# Patient Record
Sex: Male | Born: 1950 | Race: White | Hispanic: No | Marital: Married | Smoking: Former smoker
Health system: Southern US, Community
[De-identification: ages and names within clinical notes are randomized; demographics above are authoritative.]

## PROBLEM LIST (undated history)

## (undated) DIAGNOSIS — E785 Hyperlipidemia, unspecified: Secondary | ICD-10-CM

## (undated) DIAGNOSIS — T7840XA Allergy, unspecified, initial encounter: Secondary | ICD-10-CM

## (undated) DIAGNOSIS — E119 Type 2 diabetes mellitus without complications: Secondary | ICD-10-CM

## (undated) DIAGNOSIS — I200 Unstable angina: Secondary | ICD-10-CM

## (undated) DIAGNOSIS — I2510 Atherosclerotic heart disease of native coronary artery without angina pectoris: Secondary | ICD-10-CM

## (undated) DIAGNOSIS — W57XXXA Bitten or stung by nonvenomous insect and other nonvenomous arthropods, initial encounter: Secondary | ICD-10-CM

## (undated) DIAGNOSIS — I10 Essential (primary) hypertension: Secondary | ICD-10-CM

## (undated) DIAGNOSIS — Z8739 Personal history of other diseases of the musculoskeletal system and connective tissue: Secondary | ICD-10-CM

## (undated) DIAGNOSIS — I639 Cerebral infarction, unspecified: Secondary | ICD-10-CM

## (undated) HISTORY — PX: BACK SURGERY: SHX140

## (undated) HISTORY — PX: POLYPECTOMY: SHX149

## (undated) HISTORY — PX: COLONOSCOPY: SHX174

## (undated) HISTORY — PX: COLONOSCOPY W/ POLYPECTOMY: SHX1380

## (undated) HISTORY — DX: Bitten or stung by nonvenomous insect and other nonvenomous arthropods, initial encounter: W57.XXXA

## (undated) HISTORY — DX: Allergy, unspecified, initial encounter: T78.40XA

## (undated) HISTORY — PX: FACIAL RECONSTRUCTION SURGERY: SHX631

## (undated) HISTORY — DX: Hyperlipidemia, unspecified: E78.5

## (undated) HISTORY — DX: Cerebral infarction, unspecified: I63.9

## (undated) HISTORY — PX: EXCISIONAL HEMORRHOIDECTOMY: SHX1541

---
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
## 1999-12-18 DIAGNOSIS — W57XXXA Bitten or stung by nonvenomous insect and other nonvenomous arthropods, initial encounter: Secondary | ICD-10-CM

## 1999-12-18 HISTORY — DX: Bitten or stung by nonvenomous insect and other nonvenomous arthropods, initial encounter: W57.XXXA

## 2001-08-14 ENCOUNTER — Inpatient Hospital Stay (HOSPITAL_COMMUNITY): Admission: EM | Admit: 2001-08-14 | Discharge: 2001-08-15 | Payer: Self-pay

## 2001-08-14 ENCOUNTER — Encounter: Payer: Self-pay | Admitting: *Deleted

## 2001-08-15 ENCOUNTER — Encounter: Payer: Self-pay | Admitting: Cardiology

## 2002-07-05 ENCOUNTER — Encounter: Payer: Self-pay | Admitting: Endocrinology

## 2002-07-05 ENCOUNTER — Inpatient Hospital Stay (HOSPITAL_COMMUNITY): Admission: EM | Admit: 2002-07-05 | Discharge: 2002-07-08 | Payer: Self-pay | Admitting: Emergency Medicine

## 2002-07-05 ENCOUNTER — Encounter: Payer: Self-pay | Admitting: Emergency Medicine

## 2002-07-06 ENCOUNTER — Encounter: Payer: Self-pay | Admitting: Internal Medicine

## 2002-07-06 ENCOUNTER — Encounter: Payer: Self-pay | Admitting: Endocrinology

## 2003-01-20 ENCOUNTER — Encounter: Payer: Self-pay | Admitting: Orthopaedic Surgery

## 2003-01-20 ENCOUNTER — Encounter: Admission: RE | Admit: 2003-01-20 | Discharge: 2003-01-20 | Payer: Self-pay | Admitting: Orthopaedic Surgery

## 2003-02-13 ENCOUNTER — Encounter: Payer: Self-pay | Admitting: Emergency Medicine

## 2003-02-13 ENCOUNTER — Emergency Department (HOSPITAL_COMMUNITY): Admission: EM | Admit: 2003-02-13 | Discharge: 2003-02-13 | Payer: Self-pay | Admitting: Emergency Medicine

## 2003-02-17 ENCOUNTER — Ambulatory Visit (HOSPITAL_COMMUNITY): Admission: RE | Admit: 2003-02-17 | Discharge: 2003-02-17 | Payer: Self-pay | Admitting: Oral & Maxillofacial Surgery

## 2003-02-21 ENCOUNTER — Emergency Department (HOSPITAL_COMMUNITY): Admission: EM | Admit: 2003-02-21 | Discharge: 2003-02-21 | Payer: Self-pay | Admitting: Emergency Medicine

## 2004-11-27 ENCOUNTER — Ambulatory Visit: Payer: Self-pay | Admitting: Internal Medicine

## 2005-10-15 ENCOUNTER — Ambulatory Visit: Payer: Self-pay | Admitting: Internal Medicine

## 2006-08-09 ENCOUNTER — Ambulatory Visit: Payer: Self-pay | Admitting: Internal Medicine

## 2006-08-21 ENCOUNTER — Ambulatory Visit: Payer: Self-pay | Admitting: Internal Medicine

## 2006-08-28 ENCOUNTER — Ambulatory Visit: Payer: Self-pay | Admitting: Internal Medicine

## 2007-09-08 ENCOUNTER — Ambulatory Visit: Payer: Self-pay | Admitting: Internal Medicine

## 2007-09-08 DIAGNOSIS — N400 Enlarged prostate without lower urinary tract symptoms: Secondary | ICD-10-CM | POA: Insufficient documentation

## 2007-09-08 DIAGNOSIS — T887XXA Unspecified adverse effect of drug or medicament, initial encounter: Secondary | ICD-10-CM | POA: Insufficient documentation

## 2007-09-08 DIAGNOSIS — G479 Sleep disorder, unspecified: Secondary | ICD-10-CM | POA: Insufficient documentation

## 2007-09-08 LAB — CONVERTED CEMR LAB
Cholesterol, target level: 200 mg/dL
HDL goal, serum: 40 mg/dL
LDL Goal: 160 mg/dL

## 2007-09-12 ENCOUNTER — Encounter (INDEPENDENT_AMBULATORY_CARE_PROVIDER_SITE_OTHER): Payer: Self-pay | Admitting: *Deleted

## 2007-09-12 ENCOUNTER — Ambulatory Visit: Payer: Self-pay | Admitting: Internal Medicine

## 2007-09-12 LAB — CONVERTED CEMR LAB
ALT: 35 units/L (ref 0–53)
AST: 24 units/L (ref 0–37)
Albumin: 3.9 g/dL (ref 3.5–5.2)
Alkaline Phosphatase: 72 units/L (ref 39–117)
BUN: 11 mg/dL (ref 6–23)
Bilirubin, Direct: 0.1 mg/dL (ref 0.0–0.3)
CO2: 24 meq/L (ref 19–32)
Calcium: 9.2 mg/dL (ref 8.4–10.5)
Chloride: 104 meq/L (ref 96–112)
Cholesterol: 212 mg/dL (ref 0–200)
Creatinine, Ser: 0.9 mg/dL (ref 0.4–1.5)
Direct LDL: 153.3 mg/dL
GFR calc Af Amer: 112 mL/min
GFR calc non Af Amer: 92 mL/min
Glucose, Bld: 120 mg/dL — ABNORMAL HIGH (ref 70–99)
HDL: 37 mg/dL — ABNORMAL LOW (ref >39.0)
Hgb A1c MFr Bld: 6.3 % — ABNORMAL HIGH (ref 4.6–6.0)
PSA: 0.85 ng/mL (ref 0.10–4.00)
Potassium: 3.8 meq/L (ref 3.5–5.1)
Sodium: 138 meq/L (ref 135–145)
Total Bilirubin: 1 mg/dL (ref 0.3–1.2)
Total CHOL/HDL Ratio: 5.7
Total Protein: 6.8 g/dL (ref 6.0–8.3)
Triglycerides: 152 mg/dL — ABNORMAL HIGH (ref 0–149)
VLDL: 30 mg/dL (ref 0–40)

## 2007-09-15 ENCOUNTER — Encounter (INDEPENDENT_AMBULATORY_CARE_PROVIDER_SITE_OTHER): Payer: Self-pay | Admitting: *Deleted

## 2007-09-24 ENCOUNTER — Encounter: Payer: Self-pay | Admitting: Internal Medicine

## 2007-11-05 ENCOUNTER — Ambulatory Visit: Payer: Self-pay | Admitting: Internal Medicine

## 2007-11-05 DIAGNOSIS — R7989 Other specified abnormal findings of blood chemistry: Secondary | ICD-10-CM | POA: Insufficient documentation

## 2007-11-06 ENCOUNTER — Encounter (INDEPENDENT_AMBULATORY_CARE_PROVIDER_SITE_OTHER): Payer: Self-pay | Admitting: *Deleted

## 2007-11-06 LAB — CONVERTED CEMR LAB: Hgb A1c MFr Bld: 6.3 % — ABNORMAL HIGH (ref 4.6–6.0)

## 2007-11-12 ENCOUNTER — Ambulatory Visit: Payer: Self-pay | Admitting: Internal Medicine

## 2007-11-12 LAB — CONVERTED CEMR LAB: LDL Goal: 130 mg/dL

## 2007-11-14 DIAGNOSIS — M109 Gout, unspecified: Secondary | ICD-10-CM | POA: Insufficient documentation

## 2007-11-14 DIAGNOSIS — Z8739 Personal history of other diseases of the musculoskeletal system and connective tissue: Secondary | ICD-10-CM | POA: Insufficient documentation

## 2008-02-12 ENCOUNTER — Ambulatory Visit: Payer: Self-pay | Admitting: Internal Medicine

## 2008-02-15 LAB — CONVERTED CEMR LAB
ALT: 43 units/L (ref 0–53)
AST: 27 units/L (ref 0–37)
Albumin: 3.7 g/dL (ref 3.5–5.2)
Alkaline Phosphatase: 63 units/L (ref 39–117)
BUN: 17 mg/dL (ref 6–23)
Bilirubin, Direct: 0.1 mg/dL (ref 0.0–0.3)
Cholesterol: 141 mg/dL (ref 0–200)
Creatinine, Ser: 1.2 mg/dL (ref 0.4–1.5)
HDL: 32.8 mg/dL — ABNORMAL LOW (ref >39.0)
Hgb A1c MFr Bld: 5.9 % (ref 4.6–6.0)
LDL Cholesterol: 78 mg/dL (ref 0–99)
Potassium: 4.2 meq/L (ref 3.5–5.1)
Total Bilirubin: 0.8 mg/dL (ref 0.3–1.2)
Total CHOL/HDL Ratio: 4.3
Total Protein: 6.4 g/dL (ref 6.0–8.3)
Triglycerides: 149 mg/dL (ref 0–149)
VLDL: 30 mg/dL (ref 0–40)

## 2008-02-17 ENCOUNTER — Encounter (INDEPENDENT_AMBULATORY_CARE_PROVIDER_SITE_OTHER): Payer: Self-pay | Admitting: *Deleted

## 2008-02-24 ENCOUNTER — Ambulatory Visit: Payer: Self-pay | Admitting: Internal Medicine

## 2008-02-24 DIAGNOSIS — E1169 Type 2 diabetes mellitus with other specified complication: Secondary | ICD-10-CM | POA: Insufficient documentation

## 2008-02-24 DIAGNOSIS — E782 Mixed hyperlipidemia: Secondary | ICD-10-CM | POA: Insufficient documentation

## 2008-08-12 ENCOUNTER — Telehealth (INDEPENDENT_AMBULATORY_CARE_PROVIDER_SITE_OTHER): Payer: Self-pay | Admitting: *Deleted

## 2008-08-20 ENCOUNTER — Ambulatory Visit: Payer: Self-pay | Admitting: Internal Medicine

## 2008-08-20 LAB — CONVERTED CEMR LAB
Creatinine,U: 262.7 mg/dL
Hgb A1c MFr Bld: 5.9 % (ref 4.6–6.0)
Microalb Creat Ratio: 13.3 mg/g (ref 0.0–30.0)
Microalb, Ur: 3.5 mg/dL — ABNORMAL HIGH (ref 0.0–1.9)
Total CHOL/HDL Ratio: 3.8

## 2008-08-26 ENCOUNTER — Telehealth: Payer: Self-pay | Admitting: Internal Medicine

## 2008-08-26 ENCOUNTER — Ambulatory Visit: Payer: Self-pay | Admitting: Internal Medicine

## 2008-08-26 DIAGNOSIS — D126 Benign neoplasm of colon, unspecified: Secondary | ICD-10-CM | POA: Insufficient documentation

## 2008-11-10 ENCOUNTER — Encounter: Payer: Self-pay | Admitting: Internal Medicine

## 2008-12-15 ENCOUNTER — Encounter: Payer: Self-pay | Admitting: Internal Medicine

## 2008-12-23 ENCOUNTER — Telehealth (INDEPENDENT_AMBULATORY_CARE_PROVIDER_SITE_OTHER): Payer: Self-pay | Admitting: *Deleted

## 2008-12-30 ENCOUNTER — Encounter: Payer: Self-pay | Admitting: Internal Medicine

## 2009-01-06 ENCOUNTER — Ambulatory Visit: Payer: Self-pay | Admitting: Internal Medicine

## 2009-01-06 DIAGNOSIS — Z860100 Personal history of colon polyps, unspecified: Secondary | ICD-10-CM | POA: Insufficient documentation

## 2009-01-06 DIAGNOSIS — L509 Urticaria, unspecified: Secondary | ICD-10-CM | POA: Insufficient documentation

## 2009-01-06 DIAGNOSIS — E119 Type 2 diabetes mellitus without complications: Secondary | ICD-10-CM | POA: Insufficient documentation

## 2009-01-19 ENCOUNTER — Encounter: Payer: Self-pay | Admitting: Internal Medicine

## 2009-03-07 ENCOUNTER — Encounter (INDEPENDENT_AMBULATORY_CARE_PROVIDER_SITE_OTHER): Payer: Self-pay | Admitting: *Deleted

## 2009-03-14 ENCOUNTER — Telehealth (INDEPENDENT_AMBULATORY_CARE_PROVIDER_SITE_OTHER): Payer: Self-pay | Admitting: *Deleted

## 2009-03-15 ENCOUNTER — Ambulatory Visit: Payer: Self-pay | Admitting: Internal Medicine

## 2009-03-15 ENCOUNTER — Encounter (INDEPENDENT_AMBULATORY_CARE_PROVIDER_SITE_OTHER): Payer: Self-pay | Admitting: *Deleted

## 2009-03-15 LAB — CONVERTED CEMR LAB
Albumin: 3.7 g/dL (ref 3.5–5.2)
BUN: 15 mg/dL (ref 6–23)
HDL: 36.9 mg/dL — ABNORMAL LOW (ref >39.00)
LDL Cholesterol: 77 mg/dL (ref 0–99)
Potassium: 4.4 meq/L (ref 3.5–5.1)
Total CHOL/HDL Ratio: 4
Triglycerides: 177 mg/dL — ABNORMAL HIGH (ref 0.0–149.0)

## 2009-03-21 ENCOUNTER — Ambulatory Visit: Payer: Self-pay | Admitting: Internal Medicine

## 2009-04-19 ENCOUNTER — Telehealth (INDEPENDENT_AMBULATORY_CARE_PROVIDER_SITE_OTHER): Payer: Self-pay | Admitting: *Deleted

## 2009-06-16 ENCOUNTER — Ambulatory Visit: Payer: Self-pay | Admitting: Internal Medicine

## 2009-06-29 ENCOUNTER — Encounter: Payer: Self-pay | Admitting: Internal Medicine

## 2009-06-29 ENCOUNTER — Ambulatory Visit: Payer: Self-pay | Admitting: Internal Medicine

## 2009-06-30 ENCOUNTER — Encounter: Payer: Self-pay | Admitting: Internal Medicine

## 2009-07-06 ENCOUNTER — Telehealth: Payer: Self-pay | Admitting: Internal Medicine

## 2009-07-14 ENCOUNTER — Telehealth (INDEPENDENT_AMBULATORY_CARE_PROVIDER_SITE_OTHER): Payer: Self-pay | Admitting: *Deleted

## 2009-08-03 ENCOUNTER — Telehealth (INDEPENDENT_AMBULATORY_CARE_PROVIDER_SITE_OTHER): Payer: Self-pay | Admitting: *Deleted

## 2009-08-08 ENCOUNTER — Telehealth (INDEPENDENT_AMBULATORY_CARE_PROVIDER_SITE_OTHER): Payer: Self-pay | Admitting: *Deleted

## 2009-08-09 ENCOUNTER — Telehealth (INDEPENDENT_AMBULATORY_CARE_PROVIDER_SITE_OTHER): Payer: Self-pay | Admitting: *Deleted

## 2009-09-07 ENCOUNTER — Telehealth (INDEPENDENT_AMBULATORY_CARE_PROVIDER_SITE_OTHER): Payer: Self-pay | Admitting: *Deleted

## 2009-12-05 ENCOUNTER — Encounter: Payer: Self-pay | Admitting: Internal Medicine

## 2009-12-28 ENCOUNTER — Telehealth (INDEPENDENT_AMBULATORY_CARE_PROVIDER_SITE_OTHER): Payer: Self-pay | Admitting: *Deleted

## 2010-01-31 ENCOUNTER — Telehealth (INDEPENDENT_AMBULATORY_CARE_PROVIDER_SITE_OTHER): Payer: Self-pay | Admitting: *Deleted

## 2010-02-21 ENCOUNTER — Ambulatory Visit: Payer: Self-pay | Admitting: Internal Medicine

## 2010-02-21 DIAGNOSIS — J309 Allergic rhinitis, unspecified: Secondary | ICD-10-CM | POA: Insufficient documentation

## 2010-02-21 DIAGNOSIS — R21 Rash and other nonspecific skin eruption: Secondary | ICD-10-CM | POA: Insufficient documentation

## 2010-02-21 DIAGNOSIS — R930 Abnormal findings on diagnostic imaging of skull and head, not elsewhere classified: Secondary | ICD-10-CM | POA: Insufficient documentation

## 2010-02-24 LAB — CONVERTED CEMR LAB
Creatinine, Ser: 1.2 mg/dL (ref 0.4–1.5)
Microalb Creat Ratio: 6.5 mg/g (ref 0.0–30.0)
Microalb, Ur: 0.7 mg/dL (ref 0.0–1.9)

## 2010-04-26 ENCOUNTER — Encounter (INDEPENDENT_AMBULATORY_CARE_PROVIDER_SITE_OTHER): Payer: Self-pay | Admitting: *Deleted

## 2010-05-03 ENCOUNTER — Telehealth (INDEPENDENT_AMBULATORY_CARE_PROVIDER_SITE_OTHER): Payer: Self-pay | Admitting: *Deleted

## 2010-05-03 ENCOUNTER — Ambulatory Visit: Payer: Self-pay | Admitting: Internal Medicine

## 2010-05-08 LAB — CONVERTED CEMR LAB
Cholesterol: 180 mg/dL (ref 0–200)
Creatinine, Ser: 0.9 mg/dL (ref 0.4–1.5)
HDL: 39.6 mg/dL (ref >39.00)
Hgb A1c MFr Bld: 5.9 % (ref 4.6–6.5)
VLDL: 21.8 mg/dL (ref 0.0–40.0)

## 2010-06-28 ENCOUNTER — Ambulatory Visit: Payer: Self-pay | Admitting: Internal Medicine

## 2010-07-06 ENCOUNTER — Ambulatory Visit: Payer: Self-pay | Admitting: Sports Medicine

## 2010-07-06 DIAGNOSIS — IMO0002 Reserved for concepts with insufficient information to code with codable children: Secondary | ICD-10-CM | POA: Insufficient documentation

## 2010-07-06 DIAGNOSIS — M25569 Pain in unspecified knee: Secondary | ICD-10-CM | POA: Insufficient documentation

## 2010-07-07 ENCOUNTER — Telehealth: Payer: Self-pay | Admitting: Sports Medicine

## 2010-10-16 ENCOUNTER — Encounter: Admission: RE | Admit: 2010-10-16 | Discharge: 2010-10-16 | Payer: Self-pay | Admitting: Sports Medicine

## 2010-10-16 ENCOUNTER — Ambulatory Visit: Payer: Self-pay | Admitting: Sports Medicine

## 2011-01-01 ENCOUNTER — Encounter
Admission: RE | Admit: 2011-01-01 | Discharge: 2011-01-01 | Payer: Self-pay | Source: Home / Self Care | Attending: Sports Medicine | Admitting: Sports Medicine

## 2011-01-01 ENCOUNTER — Ambulatory Visit
Admission: RE | Admit: 2011-01-01 | Discharge: 2011-01-01 | Payer: Self-pay | Source: Home / Self Care | Attending: Sports Medicine | Admitting: Sports Medicine

## 2011-01-01 DIAGNOSIS — M546 Pain in thoracic spine: Secondary | ICD-10-CM | POA: Insufficient documentation

## 2011-01-11 ENCOUNTER — Telehealth (INDEPENDENT_AMBULATORY_CARE_PROVIDER_SITE_OTHER): Payer: Self-pay | Admitting: *Deleted

## 2011-01-15 ENCOUNTER — Ambulatory Visit
Admission: RE | Admit: 2011-01-15 | Discharge: 2011-01-15 | Payer: Self-pay | Source: Home / Self Care | Attending: Internal Medicine | Admitting: Internal Medicine

## 2011-01-15 ENCOUNTER — Other Ambulatory Visit: Payer: Self-pay | Admitting: Internal Medicine

## 2011-01-15 LAB — MICROALBUMIN / CREATININE URINE RATIO
Creatinine,U: 313.6 mg/dL
Microalb Creat Ratio: 1.8 mg/g (ref 0.0–30.0)

## 2011-01-15 LAB — HEPATIC FUNCTION PANEL
AST: 18 U/L (ref 0–37)
Alkaline Phosphatase: 67 U/L (ref 39–117)
Bilirubin, Direct: 0.1 mg/dL (ref 0.0–0.3)
Total Bilirubin: 0.8 mg/dL (ref 0.3–1.2)
Total Protein: 6.4 g/dL (ref 6.0–8.3)

## 2011-01-15 LAB — LIPID PANEL
HDL: 37.2 mg/dL — ABNORMAL LOW (ref >39.00)
Total CHOL/HDL Ratio: 4
Triglycerides: 147 mg/dL (ref 0.0–149.0)
VLDL: 29.4 mg/dL (ref 0.0–40.0)

## 2011-01-16 NOTE — Progress Notes (Signed)
Summary: Refill Request  Phone Note Refill Request   Refills Requested: Medication #1:  PRAVACHOL 40 MG  TABS 1 at bedtime**LABS DUE NOW**  Medication #2:  METFORMIN HCL 500 MG XR24H-TAB 2 two times a day with 2 largest meals. ADDITIONAL REFILLS REQUIRE AN APPOINTMENT patient is out of med he went to pharmacy today - lab scheduled at [facility] [id number] [facility]   Method Requested: Fax to Local Pharmacy Initial call taken by: Okey Regal [place],  May 03, 2010 9:14 AM    Prescriptions: METFORMIN HCL 500 MG XR24H-TAB (METFORMIN HCL) 2 two times a day with 2 largest meals. ADDITIONAL REFILLS REQUIRE AN APPOINTMENT  #60 x 0   Entered by:   Shonna Chock   Authorized by:   Marga Melnick MD   Signed by:   Shonna Chock on 05/03/2010   Method used:   Electronically to        [facility] (retail)       [address]       [place], [place]  [address]       Ph: [phone] or [phone]       Fax: [phone]   RxID:   [id number] PRAVACHOL 40 MG  TABS (PRAVASTATIN SODIUM) 1 at bedtime**LABS DUE NOW**  #30 x 0   Entered by:   Shonna Chock   Authorized by:   Marga Melnick MD   Signed by:   Shonna Chock on 05/03/2010   Method used:   Electronically to        [facility] (retail)       [address]       [place], [place]  [address]       Ph: [phone] or [phone]       Fax: [phone]   RxID:   [id number]

## 2011-01-16 NOTE — Assessment & Plan Note (Signed)
Summary: F/U,MC   Vital Signs:  Patient profile:   60 year old male BP sitting:   177 / 95  Vitals Entered By: Lillia Pauls CMA (October 16, 2010 10:01 AM)  History of Present Illness: Alex Bryant is here today to F/ U on his right knee pain. He was seen previously on July 21/2011. a MSK U/S of his knee was done which reported medial meniscal tear, patellar pouch effusion. He also had an U/S guided cortisone injection on his suprapatellar pouch. He was doing better but in the last 30 days his pain got worse. No Hx of trauma. he does not recall any particular event that trigger the pain back again. He debnies machanical symptoms as locking, catching or given away. He denies  swelling. It hurts with weight bearing activities. The pain is localize in the mid joint line. 1/10 intensity. Sharp. Not radiated.  Allergies: No Known Drug Allergies  Physical Exam  General:  Well-developed,well-nourished,in no acute distress; alert,appropriate and cooperative throughout examination   Knee Exam  General:    Legs with good alingment. Right knee with intact skin. Full ROM for flexion, extension, internal and external rotation. Tenderness in the mid joint line with palpation. Mcmurray and Appley test positive for medial meniscal tear. Patelofemoral compression test positive for patelofemoral pain. Ligaments intact. no instability. Normal gait no limp.    Impression & Recommendations:  Problem # 1:  MEDIAL MENISCUS TEAR, RIGHT (ICD-836.0)  MSK done today that showerd decreased suprapatelar pouch effusion compare with the previous one. Also calcification ans a medial meniscal tear. Weithg bearing x ray of both knees were order. In the mean time quad strengtening and stretches exercise were recomended. We will contact the patient with the x ray results.  Orders: [place] LIMITED ([address])  Problem # 2:  KNEE PAIN, RIGHT (ICD-719.46)  Orders: Diagnostic X-Ray/Fluoroscopy (Diagnostic  X-Ray/Flu) [place] LIMITED ([id number])  this is definitely less  xrays reviewed to see degree of djd there is some djd bilat but most is mild  will discuss w patient but I would  favor cont conservative care  Complete Medication List: 1)  Klonopin 0.5 Mg Tabs (Clonazepam) .... Take 1-2 at bedtime as needed 2)  Multivitamin  .... Once daily 3)  Fish Oil 1000 Mg Caps (Omega-3 fatty acids) .... 2 by mouth once daily 4)  Asa 81mg   .... Once daily 5)  Freestyle Lite Strp (Glucose blood) .... Use one daily for blood sugar 6)  Freestyle Lancets Misc (Lancets) .... Use one daily 7)  Viagra 100 Mg Tabs (Sildenafil citrate) .Marland Kitchen.. 1 once daily as needed 8)  Metformin Hcl 500 Mg Xr24h-tab (Metformin hcl) .Marland Kitchen.. 1 two times a day with 2 largest meals 9)  Simvastatin 20 Mg Tabs (Simvastatin) .Marland Kitchen.. 1 at bedtime **appointment due** 10)  Lisinopril 20 Mg Tabs (Lisinopril) .Marland Kitchen.. 1 once daily if bp averages > 135/85   Orders Added: 1)  Diagnostic X-Ray/Fluoroscopy [Diagnostic X-Ray/Flu] 2)  Est. Patient Level III [[address]] 3)  [place] LIMITED [[id number]]

## 2011-01-16 NOTE — Miscellaneous (Signed)
Summary: [facility]  [facility]   Imported By: Lanelle Bal 12/22/2009 08:38:45  _____________________________________________________________________  External Attachment:    Type:   Image     Comment:   External Document

## 2011-01-16 NOTE — Progress Notes (Signed)
Summary: Refill Request  Phone Note Refill Request Message from:  Pharmacy on [facility] #: [phone]  Refills Requested: Medication #1:  Clonazepam 0.5mg  tab take 1-2 tablets every night at bedtime as directed   Dosage confirmed as above?Dosage Confirmed   Brand Name Necessary? No   Supply Requested: 1 month Initial call taken by: Harold Barban,  January 31, 2010 10:45 AM    Prescriptions: KLONOPIN 0.5 MG  TABS (CLONAZEPAM) take 1-2 at bedtime as needed  #180 x 0   Entered by:   Shonna Chock   Authorized by:   Marga Melnick MD   Signed by:   Shonna Chock on 01/31/2010   Method used:   Printed then faxed to ...       [facility] (retail)       [address]       [place], [place]  [address]       Ph: [phone] or [phone]       Fax: [phone]   RxID:   [id number]

## 2011-01-16 NOTE — Progress Notes (Signed)
Summary: Phone Note Re: Steroid injection   Phone Note Outgoing Call Call back at [facility] Phone [phone]   Call placed by: Rodney Langton MD,  July 07, 2010 1:46 PM Summary of Call: Notified pt that steroid injection can cause increased blood sugar transiently for a couple of days but doesn't affect HbA1c.  LMOM. Initial call taken by: Rodney Langton MD,  July 07, 2010 1:46 PM

## 2011-01-16 NOTE — Letter (Signed)
Summary: Primary Care Appointment Letter  [facility]  [address] [place], [place] [address]   Phone: [phone]  Fax: [phone]    04/26/2010 MRN: [id number]  Alex Bryant [address] [place], [place]  [address]  Dear [name],   Your Primary Care Physician Marga Melnick MD has indicated that:    _______it is time to schedule an appointment.    _______you missed your appointment on______ and need to call and          reschedule.    _ X______you need to have lab work done.    _______you need to schedule an appointment discuss lab or test results.    _______you need to call to reschedule your appointment that is                       scheduled on _________.     Please call our office as soon as possible. Our phone number is [phone]. Please press option 1. Our office is open 8a-12noon and 1p-5p, Monday through Friday.     Thank you,    [facility]

## 2011-01-16 NOTE — Progress Notes (Signed)
Summary: Record request [facility] [facility] [employer] [facility] [employer]  Request for records received from [facility] [place] [place]. Request forwarded to [facility].Wilder Glade  December 28, 2009 3:56 PM

## 2011-01-16 NOTE — Assessment & Plan Note (Signed)
Summary: 4PM - KNEE PAIN X 1 WEEK/PER Fay Bagg/NM   Vital Signs:  Patient profile:   60 year old male Height:      72 inches Weight:      227.50 pounds BMI:     30.97 Pulse rate:   73 / minute BP sitting:   150 / 88  (left arm)  Vitals Entered By: Terese Door (July 06, 2010 3:59 PM) CC: NP-right knee pain Is Patient Diabetic? Yes Pain Assessment Patient in pain? yes     Location: knee Intensity: 4   CC:  NP-right knee pain.  History of Present Illness: 60 yo male with R knee pain for the last week.  Was playing golf, no identifiable injury, then immediate pain after playing, swelling started but was really evident a few days later. Now feels tight, swollen, no catch or pop but does give out.  Pain mostly post-medial.  Tried motrin/tylenol, didn't really help.   Current Medications (verified): 1)  Klonopin 0.5 Mg  Tabs (Clonazepam) .... Take 1-2 At Bedtime As Needed 2)  Multivitamin .... Once Daily 3)  Fish Oil 1000 Mg Caps (Omega-3 Fatty Acids) .... 2 By Mouth Once Daily 4)  Asa 81mg  .... Once Daily 5)  Freestyle Lite   Strp (Glucose Blood) .... Use One Daily For Blood Sugar 6)  Freestyle Lancets   Misc (Lancets) .... Use One Daily 7)  Viagra 100 Mg Tabs (Sildenafil Citrate) .Marland Kitchen.. 1 Once Daily As Needed 8)  Metformin Hcl 500 Mg Xr24h-Tab (Metformin Hcl) .Marland Kitchen.. 1 Two Times A Day With 2 Largest Meals 9)  Simvastatin 20 Mg Tabs (Simvastatin) .Marland Kitchen.. 1 At Bedtime 10)  Lisinopril 20 Mg Tabs (Lisinopril) .Marland Kitchen.. 1 Once Daily If Bp Averages > 135/85  Allergies (verified): No Known Drug Allergies  Review of Systems       See HPI  Physical Exam  General:  Well-developed,well-nourished,in no acute distress; alert,appropriate and cooperative throughout examination Msk:  Knee: R Moderate effusion on inspecion.  No erythema. Palpation normal with no warmth or joint line tenderness or patellar tenderness or condyle tenderness. ROM normal in flexion and extension and lower leg  rotation. Ligaments with solid consistent endpoints including ACL, PCL, LCL, MCL. Positive Mcmurrays with pain reproduced post-medial, Positive Thessaly, Negative apley Non painful patellar compression. Patellar and quadriceps tendons unremarkable. Hamstring and quadriceps strength is normal.   Additional Exam:  MSK [place]: Right knee with split seen in medial meniscus at midline.  Lateral meniscus appears normal.  Patellar tendon norm;  suprapatellar pouch has moderate swelling with Effusion seen extending under quad tendon.  Posterior structures imaged and intact.  Images and video saved.   Joint Injection under real-time US guidance: Consent obtained and verified. Sterile cleansed with alcohol. Topical analgesic spray: Ethyl chloride. Joint: R knee Completed without difficulty Meds: 5cc lidocaine, 1cc kenalog 40 Needle:25g Aftercare instructions and Red flags advised.     Impression & Recommendations:  Problem # 1:  MEDIAL MENISCUS TEAR, RIGHT (ICD-836.0) Assessment New  Injected with immediate improvement in symptoms. Knee sleeve to be work for activity to help effusion and improve stability. Knee extension/meniscal rehab exercises advised. Stationary biking ok, avoid elliptical. Avoid golfing and other twisting activities for now. RTC 4-6 weeks.  Orders: [place] LIMITED ([address]) Joint Aspirate / Injection, Large ([address]) Kenalog 10 mg inj (U0454)  Problem # 2:  KNEE PAIN, RIGHT (ICD-719.46)  suspect that most of pain is related to acute meniscal injury however w age > 60 chance of degenerative change  and DJD is high will get standing knee films if this does not resolve  Orders: [place] LIMITED ([address]) Joint Aspirate / Injection, Large ([address]) Kenalog 10 mg inj ([id number])  Complete Medication List: 1)  Klonopin 0.5 Mg Tabs (Clonazepam) .... Take 1-2 at bedtime as needed 2)  Multivitamin  .... Once daily 3)  Fish Oil 1000 Mg Caps (Omega-3 fatty acids) .... 2 by mouth once  daily 4)  Asa 81mg   .... Once daily 5)  Freestyle Lite Strp (Glucose blood) .... Use one daily for blood sugar 6)  Freestyle Lancets Misc (Lancets) .... Use one daily 7)  Viagra 100 Mg Tabs (Sildenafil citrate) .Marland Kitchen.. 1 once daily as needed 8)  Metformin Hcl 500 Mg Xr24h-tab (Metformin hcl) .Marland Kitchen.. 1 two times a day with 2 largest meals 9)  Simvastatin 20 Mg Tabs (Simvastatin) .Marland Kitchen.. 1 at bedtime 10)  Lisinopril 20 Mg Tabs (Lisinopril) .Marland Kitchen.. 1 once daily if bp averages > 135/85  Patient Instructions: 1)  Great to meet you, 2)  You have torn your meniscus. 3)  Do the knee exercises on the handout. 4)  Stationary bike is ok. 5)  Avoid golf/twisting motions for now. 6)  Knee sleeve. 7)  Come back to see [place] in 4-6 weeks. 8)  -Dr. Benjamin Stain.

## 2011-01-16 NOTE — Assessment & Plan Note (Signed)
Summary: ROA   Vital Signs:  Patient profile:   60 year old male Weight:      229.4 pounds Pulse rate:   64 / minute Resp:     15 per minute BP sitting:   146 / 82  (left arm) Cuff size:   large  Vitals Entered By: Shonna Chock CMA (June 28, 2010 9:05 AM) CC: Follow-up visit: refill meds , Type 2 diabetes mellitus follow-up, Hypertension Management Comments REVIEWED MED LIST, PATIENT AGREED DOSE AND INSTRUCTION CORRECT    CC:  Follow-up visit: refill meds , Type 2 diabetes mellitus follow-up, and Hypertension Management.  History of Present Illness:  Type 2 Diabetes Mellitus Follow-Up      This is a 60 year old man who presents for Type 2 diabetes mellitus follow-up.  The patient denies polyuria, polydipsia, blurred vision, self managed hypoglycemia, weight loss, weight gain, and numbness of extremities.  The patient denies the following symptoms: neuropathic pain, chest pain, vomiting, orthostatic symptoms, poor wound healing, intermittent claudication, vision loss, and foot ulcer.  Since the last visit the patient reports good dietary compliance, compliance with medications, and exercising regularly.  The patient has been measuring capillary blood glucose before breakfast and after lunch. FBS 90-120 OFF Glimiperide & < 190 after lunch, usuallly 130-150. Since the last visit, the patient reports having had no eye (Ophth exam due)care and no foot care.  A1c was 5.9% in 04/2010; LDL 119. On Metformin 500 mg two times a day . Hyperlipidemia Follow-Up      The patient also presents for Hyperlipidemia follow-up.  The patient denies muscle aches, GI upset, abdominal pain, flushing, itching, constipation, diarrhea, and fatigue.  Compliance with medications (by patient report) has been near 100%.  Adjunctive measures currently used by the patient include ASA and fish oil supplements.    Hypertension History:      He denies headache, palpitations, dyspnea with exertion, orthopnea, PND, and  syncope.  In past 30 days BP up to 150/90; ? due to decreased CVE due to knee issues post strain.No PMH of HTN.        Positive major cardiovascular risk factors include male age 63 years old or older, diabetes, and hyperlipidemia.  Negative major cardiovascular risk factors include no history of hypertension, negative family history for ischemic heart disease, and non-tobacco-user status.        Further assessment for target organ damage reveals no history of ASHD, stroke/TIA, or peripheral vascular disease.     Allergies (verified): No Known Drug Allergies  Review of Systems MS:  Complains of joint pain; denies joint redness and joint swelling; Pain improved with Aleve or Tylenol..  Physical Exam  General:  well-nourished; alert,appropriate and cooperative throughout examination Lungs:  Normal respiratory effort, chest expands symmetrically. Lungs are clear to auscultation, no crackles or wheezes. Heart:  regular rhythm, no murmur, no gallop, no rub, no JVD, no HJR, and bradycardia.  S4 with slurring Abdomen:  Bowel sounds positive,abdomen soft and non-tender without masses, organomegaly or hernias noted. No AAA Pulses:  R and L carotid,radial,dorsalis pedis and posterior tibial pulses are full and equal bilaterally Extremities:  No clubbing, cyanosis, edema. High arches; boss L great toe base. Good nail health Neurologic:  alert & oriented X3 and sensation intact to light touch over feet.   Skin:  Intact without suspicious lesions or rashes   Impression & Recommendations:  Problem # 1:  DIABETES MELLITUS, TYPE II (ICD-250.00) Excellent control His updated medication list for this  problem includes:    Metformin Hcl 500 Mg Xr24h-tab (Metformin hcl) .Marland Kitchen... 1  two times a day with 2 largest meals.   Problem # 2:  HYPERLIPIDEMIA (ICD-272.2) LDL not @ goal His updated medication list for this problem includes:    Pravachol 40 Mg Tabs (Pravastatin sodium) .Marland Kitchen... 1 at bedtime  Problem #  3:  ELEVATED BLOOD PRESSURE WITHOUT DIAGNOSIS OF HYPERTENSION (ICD-796.2)  ? due toknee pain & meds  His updated medication list for this problem includes:    Lisinopril 20 Mg Tabs (Lisinopril) .Marland Kitchen... 1 once daily if bp averages > 135/85  Complete Medication List: 1)  Klonopin 0.5 Mg Tabs (Clonazepam) .... Take 1-2 at bedtime as needed 2)  Multivitamin  .... Once daily 3)  Fish Oil 1000 Mg Caps (Omega-3 fatty acids) .... 2 by mouth once daily 4)  Asa 81mg   .... Once daily 5)  Freestyle Lite Strp (Glucose blood) .... Use one daily for blood sugar 6)  Freestyle Lancets Misc (Lancets) .... Use one daily 7)  Viagra 100 Mg Tabs (Sildenafil citrate) .Marland Kitchen.. 1 once daily as needed 8)  Metformin Hcl 500 Mg Xr24h-tab (Metformin hcl) .Marland Kitchen.. 1 two times a day with 2 largest meals 9)  Simvastatin 20 Mg Tabs (Simvastatin) .Marland Kitchen.. 1 at bedtime 10)  Lisinopril 20 Mg Tabs (Lisinopril) .Marland Kitchen.. 1 once daily if bp averages > 135/85  Hypertension Assessment/Plan:      The patient's hypertensive risk group is category C: Target organ damage and/or diabetes.  His calculated 10 year risk of coronary heart disease is 27 %.  Today's blood pressure is 146/82.    Patient Instructions: 1)  Please schedule a follow-up appointment in 3 months. 2)  Check your blood sugars regularly. If your readings are usually above :150  or below90 you should contact our office. 3)  See your eye doctor yearly to check for diabetic eye damage. 4)  Check your feet each night for sore areas, calluses or signs of infection. 5)  Check your Blood Pressure regularly. If it is above: 135/85 ON AVERAGE  fill the BP med.  6)  Hepatic Panel prior to visit, ICD-9:995.20 7)  Lipid Panel prior to visit, ICD-9:272.4 8)  HbgA1C prior to visit, ICD-9:250.00 9)  Urine Microalbumin prior to visit, ICD-9:250.00 10)  It is important that you exercise regularly at least 20 minutes 5 times a week. If you develop chest pain, have severe difficulty breathing, or  feel very tired , stop exercising immediately and seek medical attention. Prescriptions: FREESTYLE LITE   STRP (GLUCOSE BLOOD) use one daily for blood sugar  #100 x 3   Entered and Authorized by:   Marga Melnick MD   Signed by:   Marga Melnick MD on 06/28/2010   Method used:   Faxed to ...       [facility] (retail)       [address]       [place], [place]  [address]       Ph: [phone] or [phone]       Fax: [phone]   RxID:   [id number] FREESTYLE LANCETS   MISC (LANCETS) use one daily  #100 x 3   Entered and Authorized by:   Marga Melnick MD   Signed by:   Marga Melnick MD on 06/28/2010   Method used:   Faxed to ...       [facility] (retail)  [address]       [place], [place]  [address]       Ph: [phone] or [phone]       Fax: [phone]   RxID:   [id number] LISINOPRIL 20 MG TABS (LISINOPRIL) 1 once daily IF BP AVERAGES > 135/85  #30 x 5   Entered and Authorized by:   Marga Melnick MD   Signed by:   Marga Melnick MD on 06/28/2010   Method used:   Print then Give to Patient   RxID:   [id number] KLONOPIN 0.5 MG  TABS (CLONAZEPAM) take 1-2 at bedtime as needed  #180 x 1   Entered and Authorized by:   Marga Melnick MD   Signed by:   Marga Melnick MD on 06/28/2010   Method used:   Print then Give to Patient   RxID:   [id number] SIMVASTATIN 20 MG TABS (SIMVASTATIN) 1 at bedtime  #90 x 0   Entered and Authorized by:   Marga Melnick MD   Signed by:   Marga Melnick MD on 06/28/2010   Method used:   Faxed to ...       [facility] (retail)       [address]       [place], [place]  [address]       Ph: [phone] or [phone]       Fax: [phone]   RxID:   [id number] METFORMIN HCL 500 MG XR24H-TAB (METFORMIN HCL) 1 two times a day with 2 largest meals  #180 x 1   Entered and Authorized by:   Marga Melnick  MD   Signed by:   Marga Melnick MD on 06/28/2010   Method used:   Faxed to ...       [facility] (retail)       [address]       [place], [place]  [address]       Ph: [phone] or [phone]       Fax: [phone]   RxID:   [id number]

## 2011-01-16 NOTE — Assessment & Plan Note (Signed)
Summary: COUGH, + DX OF FLU/RH......Marland Kitchen   Vital Signs:  Patient profile:   60 year old male Weight:      220.6 pounds Temp:     98.4 degrees F oral Pulse rate:   88 / minute Resp:     15 per minute BP sitting:   142 / 86  (left arm) Cuff size:   large  Vitals Entered By: Shonna Chock (February 21, 2010 1:49 PM) CC: Dx with Flu x 1 week ago per Urgent Care, patient had chest xray also and was told spot on lung-? Pneunmonia, patient here today for a follow-up Comments REVIEWED MED LIST, PATIENT AGREED DOSE AND INSTRUCTION CORRECT    CC:  Dx with Flu x 1 week ago per Urgent Care, patient had chest xray also and was told spot on lung-? Pneunmonia, and patient here today for a follow-up.  History of Present Illness: Onset 02/10/2009 as chills, head & chest congestion; UC Dxed flu 02/27; Rxs  for   Zpack, cough syrup  & Tamiflu. CXray report called 03/03 : "touch of PNA & spot on lung". 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 He has residual head congestion , sinus pressue. FBS 62-135; some "sluggighness with glucose of 62".FBS  averages 100. Dr Mayford Knife is assessing possible drug induced histamine release as cause of dermatitis. Allergy evaluation was negative.  Allergies (verified): No Known Drug Allergies  Family History: Father: MI 23 yo, post traumatic cns hemorrhage Mother: HTN Siblings: sister  DM2; no FH of lung CA  Social History: low carb (resumed as of Jan 2010) Occupation:Construction Executive  Married Former Smoker: quit 1991; social smoker X 10 yrs Alcohol use-yes  Review of Systems General:  Denies chills, fever, sweats, and weight loss. ENT:  Complains of nasal congestion, postnasal drainage, and sinus pressure; denies earache; Dull frontal headache w/o facial pain or purulence. CV:  Denies chest pain or discomfort, lightheadness, and near fainting. Resp:  Denies cough and sputum productive; Minimal SOB; "lung  capacity 50 % better". Derm:  Complains of changes in color of skin, itching, poor wound healing, and rash. Neuro:  Denies numbness and tingling. Endo:  Denies excessive hunger, excessive thirst, and excessive urination.  Physical Exam  General:  well-nourished,in no acute distress;  cooperative throughout examination Ears:  Pinnae roughened with slight hyperpigmentation; wax L canal Nose:  External nasal examination shows no deformity or inflammation. Nasal mucosa are pink and moist without lesions or exudates. L nare small Mouth:  Oral mucosa and oropharynx without lesions or exudates.  Teeth in good repair. Lungs:  Normal respiratory effort, chest expands symmetrically. Lungs are clear to auscultation, no crackles or wheezes. Skin:  See EARS; "excoriated " appearing lesions of forearm Cervical Nodes:  No lymphadenopathy noted Axillary Nodes:  No palpable lymphadenopathy Psych:  memory intact for recent and remote, normally interactive. He understands concept of pulmonary nodule risk assessment & is  not anxious appearing.     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 1 at bedtime    Fluticasone Propionate 50 Mcg/act Susp (Fluticasone propionate) .Marland Kitchen... 1 spray two times a day  Problem # 3:  DIABETES MELLITUS, TYPE II (ICD-250.00) Occasional hypoglycemia His updated medication  list for this problem includes:    Metformin Hcl 500 Mg Xr24h-tab (Metformin hcl) .Marland Kitchen... 1 two times a day with 2 largest meals. **needs labwork before additional refills**    Glimepiride 2 Mg Tabs (Glimepiride) .Marland Kitchen... 1 q am  Orders: Venipuncture (16109) TLB-Creatinine, Blood (82565-CREA) TLB-Potassium (K+) (84132-K) TLB-BUN  (Urea Nitrogen) (84520-BUN) TLB-A1C / Hgb A1C (Glycohemoglobin) (83036-A1C) TLB-Microalbumin/Creat Ratio, Urine (82043-MALB)  Problem # 4:  RASH-NONVESICULAR (ICD-782.1) as per Dr Mayford Knife; ? iatrogenic. IF DM control is good as many as possible Diabetes meds will be D/Ced   Complete Medication List: 1)  Klonopin 0.5 Mg Tabs (Clonazepam) .... Take 1-2 at bedtime as needed 2)  Pravachol 40 Mg Tabs (Pravastatin sodium) .Marland Kitchen.. 1 qhs 3)  Colchicine 0.6 Mg Tabs (Colchicine) .... As needed gout 4)  Allopurinol 100 Mg Tabs (Allopurinol) .Marland Kitchen.. 1 by mouth qd 5)  Multivitamin  .... Once daily 6)  Fish Oil 1000 Mg Caps (Omega-3 fatty acids) .... 2 by mouth once daily 7)  Asa 81mg   .... Once daily 8)  Freestyle Lite Strp (Glucose blood) .... Use one daily for blood sugar 9)  Freestyle Lancets Misc (Lancets) .... Use one daily 10)  Viagra 100 Mg Tabs (Sildenafil citrate) .Marland Kitchen.. 1 once daily as needed 11)  Metformin Hcl 500 Mg Xr24h-tab (Metformin hcl) .Marland Kitchen.. 1 two times a day with 2 largest meals. **needs labwork before additional refills** 12)  Glimepiride 2 Mg Tabs (Glimepiride) .Marland Kitchen.. 1 q am 13)  Xyzal 5 Mg Tabs (Levocetirizine dihydrochloride) .Marland Kitchen.. 1 at bedtime 14)  Fluticasone Propionate 50 Mcg/act Susp (Fluticasone propionate) .Marland Kitchen.. 1 spray two times a day  Patient Instructions: 1)  Blow up 5- 10 balloons daily. CXray in 1 week. 2)  Check your blood sugars regularly. If your readings are usually above :130 or below 70 you should contact our office. 3)  See your eye doctor yearly to check for diabetic eye damage. 4)  Check your feet each night for sore areas, calluses or signs of infection. Prescriptions: FLUTICASONE PROPIONATE 50 MCG/ACT SUSP (FLUTICASONE PROPIONATE) 1 spray two times a day  #1 x 5   Entered and Authorized by:   Marga Melnick MD   Signed by:   Marga Melnick MD on 02/21/2010   Method used:   Faxed to ...       [facility]* (retail)       [address]        [place], [place]  [address]       Ph: [phone] or [phone]       Fax: [phone]   RxID:   [id number]

## 2011-01-18 NOTE — Assessment & Plan Note (Signed)
Summary: BACK PROBLEMS,MC   Vital Signs:  Patient profile:   60 year old male Pulse rate:   65 / minute BP sitting:   160 / 96  (left arm)  Vitals Entered By: Rochele Pages, RN CC: mid back pain   CC:  mid back pain.  History of Present Illness: Pt presents to clinic for evaluation of mid thoracic back pain x 2 months, that started as a "twinge" and is worsening.  One month ago stopped golf and work outs on elliptical due to pain.  Denies injury to back.  Now has sharp pain 10/10, with deep inhalation and movement at the same time.  No pain with arm movement.  Not waking up from sleep due to pain.  Has tried tylenol and aleve, which are not helpful.   He has periodically had these sxs in past year Not a specific injury  not clear why more persistent recently    Preventive Screening-Counseling & Management  Alcohol-Tobacco     Smoking Status: quit     Year Quit: 1980  Allergies (verified): No Known Drug Allergies  Physical Exam  General:  alert and well-developed.    looiks uncomfortable sitting Lungs:  Normal respiratory effort, chest expands symmetrically. Lungs are clear to auscultation, no crackles or wheezes. Heart:  Normal rate and regular rhythm. S1 and S2 normal without gallop, murmur, click, rub or other extra sounds. Msk:  Facet joints T6- T8 do not move on Rt Persisten spasm in paraspinous MM along RT from T4 to T8 level no direct tenderness over mid spine  normal arm motion normal scapular motion no pain with this   Impression & Recommendations:  Problem # 1:  BACK PAIN, THORACIC REGION (ICD-724.1)  His updated medication list for this problem includes:    Tramadol Hcl 50 Mg Tabs (Tramadol hcl) .Marland Kitchen... 1 by mouth four times per day for pain  Orders: Diagnostic X-Ray/Fluoroscopy (Diagnostic X-Ray/Flu)  increase klonopin to two times a day  check results of exercises  given some specific exercises to try to break sapsm plus use heat  F/U pending  XRay findings  Complete Medication List: 1)  Klonopin 0.5 Mg Tabs (Clonazepam) .... Take 1-2 at bedtime as needed 2)  Multivitamin  .... Once daily 3)  Fish Oil 1000 Mg Caps (Omega-3 fatty acids) .... 2 by mouth once daily 4)  Asa 81mg   .... Once daily 5)  Freestyle Lite Strp (Glucose blood) .... Use one daily for blood sugar 6)  Freestyle Lancets Misc (Lancets) .... Use one daily 7)  Viagra 100 Mg Tabs (Sildenafil citrate) .Marland Kitchen.. 1 once daily as needed 8)  Metformin Hcl 500 Mg Xr24h-tab (Metformin hcl) .Marland Kitchen.. 1 two times a day with 2 largest meals 9)  Simvastatin 20 Mg Tabs (Simvastatin) .Marland Kitchen.. 1 at bedtime **appointment due** 10)  Lisinopril 20 Mg Tabs (Lisinopril) .Marland Kitchen.. 1 once daily if bp averages > 135/85 11)  Tramadol Hcl 50 Mg Tabs (Tramadol hcl) .Marland Kitchen.. 1 by mouth four times per day for pain  Patient Instructions: 1)  Use heating pad on back 2 times per day 2)  Lie down on something firm and round in between painful area of back for a few minutes 2-3 times per day 3)  Roll a ball between back and wall  Prescriptions: TRAMADOL HCL 50 MG TABS (TRAMADOL HCL) 1 by mouth four times per day for pain  #100 x 2   Entered and Authorized by:   Enid Baas MD   Signed by:  Enid Baas MD on 01/01/2011   Method used:   Electronically to        [id number] (retail)       [address]       [place], [place]  [address]       Ph: [phone]       Fax: [phone]   RxID:   [id number]    Orders Added: 1)  Diagnostic X-Ray/Fluoroscopy [Diagnostic X-Ray/Flu] 2)  Est. Patient Level III [40102]

## 2011-01-18 NOTE — Progress Notes (Signed)
Summary: Refill  Phone Note Refill Request Message from:  Patient on January 11, 2011 9:20 AM  Refills Requested: Medication #1:  KLONOPIN 0.5 MG  TABS take 1-2 at bedtime as needed   Dosage confirmed as above?Dosage Confirmed   Supply Requested: 1 month  Medication #2:  SIMVASTATIN 20 MG TABS 1 at bedtime **APPOINTMENT DUE**   Dosage confirmed as above?Dosage Confirmed   Supply Requested: 1 month Alex Bryant  Next Appointment Scheduled: 1.30.12 Initial call taken by: Lavell Islam,  January 11, 2011 9:33 AM  Follow-up for Phone Call        Reason for 1/2 supply of meds  Patient was to schedule a 3 month follow-up and hab labs prior, per 06/28/2010 OV Instruction. (Due in 09/2010-NEVER SCHEDULED   6)  Hepatic Panel prior to visit, ICD-9:995.20 7)  Lipid Panel prior to visit, ICD-9:272.4 8)  HbgA1C prior to visit, ICD-9:250.00 9)  Urine Microalbumin prior to visit, ICD-9:250.00 Follow-up by: Shonna Chock CMA,  January 11, 2011 1:52 PM    Prescriptions: KLONOPIN 0.5 MG  TABS (CLONAZEPAM) take 1-2 at bedtime as needed  #60 x 0   Entered by:   Shonna Chock CMA   Authorized by:   Marga Melnick MD   Signed by:   Shonna Chock CMA on 01/11/2011   Method used:   Printed then faxed to ...       [id number] (retail)       [address]       [place], [place]  [address]       Ph: [phone]       Fax: [phone]   RxID:   [id number] SIMVASTATIN 20 MG TABS (SIMVASTATIN) 1 at bedtime **APPOINTMENT DUE**  #15 x 0   Entered by:   Shonna Chock CMA   Authorized by:   Marga Melnick MD   Signed by:   Shonna Chock CMA on 01/11/2011   Method used:   Electronically to        [id number] (retail)       [address]       [place], [place]  [address]       Ph: [phone]       Fax: [phone]   RxID:   [id number]

## 2011-01-24 ENCOUNTER — Ambulatory Visit (INDEPENDENT_AMBULATORY_CARE_PROVIDER_SITE_OTHER): Payer: No Typology Code available for payment source | Admitting: Internal Medicine

## 2011-01-24 ENCOUNTER — Encounter: Payer: Self-pay | Admitting: Internal Medicine

## 2011-01-24 DIAGNOSIS — E782 Mixed hyperlipidemia: Secondary | ICD-10-CM

## 2011-01-24 DIAGNOSIS — E119 Type 2 diabetes mellitus without complications: Secondary | ICD-10-CM

## 2011-01-24 DIAGNOSIS — E1159 Type 2 diabetes mellitus with other circulatory complications: Secondary | ICD-10-CM | POA: Insufficient documentation

## 2011-01-24 DIAGNOSIS — I10 Essential (primary) hypertension: Secondary | ICD-10-CM | POA: Insufficient documentation

## 2011-01-24 DIAGNOSIS — R404 Transient alteration of awareness: Secondary | ICD-10-CM | POA: Insufficient documentation

## 2011-02-01 NOTE — Assessment & Plan Note (Signed)
Summary: followup on labs/kn  Medications Added SIMVASTATIN 20 MG TABS (SIMVASTATIN) 1 at bedtime JANUVIA 100 MG TABS (SITAGLIPTIN PHOSPHATE) 1 once daily RAMIPRIL 5 MG CAPS (RAMIPRIL) 1 once daily       Nurse Visit   Vital Signs:  Patient profile:   60 year old male Weight:      224.8 pounds BMI:     30.60 Temp:     98.4 degrees F oral Pulse rate:   72 / minute Resp:     15 per minute BP sitting:   142 / 94  (left arm) Cuff size:   large  Vitals Entered By: Shonna Chock CMA (January 24, 2011 8:06 AM)  CC:  Follow-up visit: discuss labs (copy given)  and Type 2 diabetes mellitus follow-up.  History of Present Illness:      This is a 60 year old man who presents for Type 2 diabetes mellitus follow-up.  The patient reports weight loss of 2#, but denies polyuria, polydipsia, blurred vision, self managed hypoglycemia, and numbness of extremities.  The patient denies the following symptoms: neuropathic pain, chest pain, vomiting, orthostatic symptoms, poor wound healing, intermittent claudication, vision loss, and foot ulcer.  Since the last visit the patient reports fair  dietary compliance, not exercising regularly due to knee & back , and monitoring blood glucose.  The patient has been measuring capillary blood glucose before breakfast, 90-120.  Since the last visit, the patient reports having had eye care by an ophthalmologist ( no retinopathy) and no foot care.   A1c has risen from 5.9% to 6.6% ( 143 av; 32% risk).                                 See BP; it has risen in past 7-8 months. He describes  some  somnulence. His wife describes snoring w/o apnea.He  denies lightheadedness and headaches.  Adjunctive measures currently used by the patient include salt restriction.  BP @ home in 140/90 range.       The patient also presents for Hyperlipidemia follow-up.  The patient reports itching for years (followed by Dr Dorinda Hill), but denies muscle aches, GI upset, abdominal pain,  flushing, constipation, and diarrhea.  The patient denies the following symptoms: dypsnea, palpitations, and syncope.  Compliance with medications (by patient report) has been near 100%.  Adjunctive measures currently used by the patient include ASA, folic acid, and fish oil supplements.  Lipids revewed ; all @ goal.   Physical Exam  General:  well-nourished;alert,appropriate and cooperative throughout examination Neck:  No deformities, masses, or tenderness noted. Lungs:  Normal respiratory effort, chest expands symmetrically. Lungs are clear to auscultation, no crackles or wheezes. Heart:  Normal rate and regular rhythm. S1 and S2 normal without gallop, murmur, click, rub. S4 Pulses:  R and L carotid,radial,dorsalis pedis and posterior tibial pulses are full and equal bilaterally Extremities:  No clubbing, cyanosis, edema, or deformity noted . Good nail health Neurologic:  alert & oriented X3, sensation intact to light touch over feet, and DTRs symmetrical and normal.   Skin:  Intact without suspicious lesions or rashes Psych:  memory intact for recent and remote, normally interactive, and good eye contact.      Impression & Recommendations:  Problem # 1:  DIABETES MELLITUS, CONTROLLED (ICD-250.00)  The following medications were removed from the medication list:    Lisinopril 20 Mg Tabs (Lisinopril) .Marland Kitchen... 1 once  daily if bp averages > 135/85 His updated medication list for this problem includes:    Metformin Hcl 500 Mg Xr24h-tab (Metformin hcl) .Marland Kitchen... 1 two times a day with 2 largest meals    Januvia 100 Mg Tabs (Sitagliptin phosphate) .Marland Kitchen... 1 once daily    Ramipril 5 Mg Caps (Ramipril) .Marland Kitchen... 1 once daily  Problem # 2:  HYPERTENSION (ICD-401.9)  The following medications were removed from the medication list:    Lisinopril 20 Mg Tabs (Lisinopril) .Marland Kitchen... 1 once daily if bp averages > 135/85 His updated medication list for this problem includes:    Ramipril 5 Mg Caps (Ramipril) .Marland Kitchen...  1 once daily  Problem # 3:  HYPERLIPIDEMIA (ICD-272.2) Lipids @ goal His updated medication list for this problem includes:    Simvastatin 20 Mg Tabs (Simvastatin) .Marland Kitchen... 1 at bedtime  Problem # 4:  SOMNOLENCE (ICD-780.09) Sleep  referral if Diane defines significant snoring or ANY apnea  Complete Medication List: 1)  Klonopin 0.5 Mg Tabs (Clonazepam) .... Take 1-2 at bedtime as needed 2)  Multivitamin  .... Once daily 3)  Fish Oil 1000 Mg Caps (Omega-3 fatty acids) .... 2 by mouth once daily 4)  Asa 81mg   .... Once daily 5)  Freestyle Lite Strp (Glucose blood) .... Use one daily for blood sugar 6)  Freestyle Lancets Misc (Lancets) .... Use one daily 7)  Viagra 100 Mg Tabs (Sildenafil citrate) .Marland Kitchen.. 1 once daily as needed 8)  Metformin Hcl 500 Mg Xr24h-tab (Metformin hcl) .Marland Kitchen.. 1 two times a day with 2 largest meals 9)  Simvastatin 20 Mg Tabs (Simvastatin) .Marland Kitchen.. 1 at bedtime 10)  Tramadol Hcl 50 Mg Tabs (Tramadol hcl) .Marland Kitchen.. 1 by mouth four times per day for pain 11)  Januvia 100 Mg Tabs (Sitagliptin phosphate) .Marland Kitchen.. 1 once daily 12)  Ramipril 5 Mg Caps (Ramipril) .Marland Kitchen.. 1 once daily   Patient Instructions: 1)  Please schedule a follow-up appointment in 4 months. 2)  It is important that you exercise regularly at least 20 minutes 5 times a week. If you develop chest pain, have severe difficulty breathing, or feel very tired , stop exercising immediately and seek medical attention. 3)  BUN,creat, K+ prior to visit, ICD-9:401.9 4)  HbgA1C prior to visit, ICD-9:250.00 5)  Urine Microalbumin prior to visit, ICD-9:250.00  CC: Follow-up visit: discuss labs (copy given) , Type 2 diabetes mellitus follow-up   Current Medications (verified): 1)  Klonopin 0.5 Mg  Tabs (Clonazepam) .... Take 1-2 At Bedtime As Needed 2)  Multivitamin .... Once Daily 3)  Fish Oil 1000 Mg Caps (Omega-3 Fatty Acids) .... 2 By Mouth Once Daily 4)  Asa 81mg  .... Once Daily 5)  Freestyle Lite   Strp (Glucose Blood) ....  Use One Daily For Blood Sugar 6)  Freestyle Lancets   Misc (Lancets) .... Use One Daily 7)  Viagra 100 Mg Tabs (Sildenafil Citrate) .Marland Kitchen.. 1 Once Daily As Needed 8)  Metformin Hcl 500 Mg Xr24h-Tab (Metformin Hcl) .Marland Kitchen.. 1 Two Times A Day With 2 Largest Meals 9)  Simvastatin 20 Mg Tabs (Simvastatin) .Marland Kitchen.. 1 At Bedtime **appointment Due** 10)  Tramadol Hcl 50 Mg Tabs (Tramadol Hcl) .Marland Kitchen.. 1 By Mouth Four Times Per Day For Pain  Allergies: No Known Drug Allergies  Orders Added: 1)  Est. Patient Level IV [16109] Prescriptions: KLONOPIN 0.5 MG  TABS (CLONAZEPAM) take 1-2 at bedtime as needed  #60 x 5   Entered and Authorized by:   Marga Melnick MD   Signed  by:   Marga Melnick MD on 01/24/2011   Method used:   Print then Give to Patient   RxID:   [id number] RAMIPRIL 5 MG CAPS (RAMIPRIL) 1 once daily  #30 x 5   Entered and Authorized by:   Marga Melnick MD   Signed by:   Marga Melnick MD on 01/24/2011   Method used:   Print then Give to Patient   RxID:   [id number] JANUVIA 100 MG TABS (SITAGLIPTIN PHOSPHATE) 1 once daily  #30 x 5   Entered and Authorized by:   Marga Melnick MD   Signed by:   Marga Melnick MD on 01/24/2011   Method used:   Print then Give to Patient   RxID:   [id number] SIMVASTATIN 20 MG TABS (SIMVASTATIN) 1 at bedtime  #90 x 3   Entered and Authorized by:   Marga Melnick MD   Signed by:   Marga Melnick MD on 01/24/2011   Method used:   Print then Give to Patient   RxID:   [id number]

## 2011-03-25 LAB — GLUCOSE, CAPILLARY: Glucose-Capillary: 108 mg/dL — ABNORMAL HIGH (ref 70–99)

## 2011-03-26 ENCOUNTER — Telehealth: Payer: Self-pay | Admitting: Internal Medicine

## 2011-03-26 NOTE — Telephone Encounter (Signed)
Dr.Hopper please advise on an alternative for the patient

## 2011-03-26 NOTE — Telephone Encounter (Signed)
No coupons available, med list is available in old EMR (see printed list, placed on ledge)

## 2011-03-26 NOTE — Telephone Encounter (Signed)
Patient wants to know if there is another med for  [place] - he said he cant afford - [facility]

## 2011-03-26 NOTE — Telephone Encounter (Signed)
See coupon for Januvia & samples # 56. Supposedly the copay could be as low as $5 . He needs to contact his insurance co &  Drug[facility] as to using the coupon.

## 2011-03-26 NOTE — Telephone Encounter (Signed)
Patient aware samples and coupon placed at the front desk for pick-up

## 2011-03-26 NOTE — Telephone Encounter (Signed)
See if coupons available for janumet products ; please verify present med list .

## 2011-05-04 NOTE — Op Note (Signed)
NAMEILARIO, DHALIWAL                          ACCOUNT NO.:  [id number]   MEDICAL RECORD NO.:  [id number]                   PATIENT TYPE:  AMB   LOCATION:  DAY                                  FACILITY:  [facility]   PHYSICIAN:  Dorthula Matas, D.D.S.           DATE OF BIRTH:  [birth year]   DATE OF PROCEDURE:  02/17/2003  DATE OF DISCHARGE:                                 OPERATIVE REPORT   PREOPERATIVE DIAGNOSES:  Right zygoma and zygomatic arch fracture.   POSTOPERATIVE DIAGNOSES:  Right zygoma and zygomatic arch fracture.   OPERATION:  Open reduction and internal fixation of the right zygoma and  elevation of the depressed arch fracture; bone plate fixation along the  lateral orbital rim or stabilization of the right zygoma fracture.   SURGEON:  Dorthula Matas, D.D.S.   ANESTHESIA:  General anesthesia via oral endotracheal tube.   CULTURES:  None.   DRAINS:  None.   SPECIMENS:  None.   COMPLICATIONS:  None.   DESCRIPTION OF PROCEDURE:  The patient was brought to the OR, placed on the  OR table in the supine position. He was then placed under general anesthesia  and an oral endotracheal tube was inserted. The patient was maintained under  general anesthesia and was prepped and draped in a sterile manner for an  extraoral open reduction of the right zygoma fracture. A lateral orbital rim  incision in the brow was made that was approximately 2 1/2 cm in length.  This incision was taken down through periosteum. Prior to making an  incision, approximately 1-2 mL of 1% Xylocaine with 1:100,000 epinephrine  was infiltrated in the incision site. Once the incision was made small  bleeders were coagulated using small hemostats and Bovie electrocautery.  Once this was completed, the periosteum was reflected from the lateral  orbital rim at the zygomaticofrontal process. A fracture was noted at this  point. I then tunneled into infratemporal fossa with a urethral sound and  also  with a blunt elevator and was able to help reduce and rotate the zygoma  fracture and elevate the depressed zygomatic arch. I then stabilized the  zygomaticofrontal fracture site with a four hole bone plate of 1.5 mm bone  plate and placed four interosseous screw holes and then placed the 4 mm  length screws from the KLS set. This helped stabilize the zygoma fracture.  At this point, the surgical site was irrigated and then was closed in  layers. 4-0 Rapide was used to close the deep periosteal layer and then the  skin layer was closed with 5-0 Prolene. Multiple interrupted 5-0 Prolene  sutures were placed. I then placed Bacitracin ointment over the lateral brow  incision. The right eye was irrigated with saline. The Betadine was removed  from the face gently. I then went out intraorally and opened his mouth with  finger pressure to 40 mm where before he  was limited to 25. At this stage,  the surgical procedure was completed, the patient was awakened in the  operating room and transferred to the PACU area. I also put a finger splint  from the forehead area to the right  cheek area to remind the patient and the operative personnel to keep from  putting pressure over the zygomatic arch area. It was also demonstrable that  there was no longer a puckering of dimple of the right cheek in the area  where the zygomatic arch had been depressed. The patient is to return to see  me for follow-up.                                               Dorthula Matas, D.D.S.    SWS/MEDQ  D:  02/17/2003  T:  02/17/2003  Job:  [id number]

## 2011-05-04 NOTE — H&P (Signed)
[place]  Patient:    Alex Bryant, Alex Bryant Visit Number: [id number] MRN: [id number]          Service Type: MED Location: [phone] Attending Physician:  Justine Null Dictated by:   Justine Null, M.D. [facility] Admit Date:  07/04/2002 Discharge Date: 07/08/2002                           History and Physical  REASON FOR ADMISSION:  Fever.  HISTORY OF PRESENT ILLNESS:  The patient is a 60 year old man with one week of moderate fever.  This is associated with severe diffuse headache and shortness of breath.  There is also an associated nonitchy-type rash on the back as well as some nausea.  He was treated with a Z-Pak this past week with no improvement.  He also has bilateral ear pain.  PAST MEDICAL HISTORY:  Good general health.  MEDICATIONS:  None.  SOCIAL HISTORY:  Nonsmoker.  Occasionally drinks alcohol.  Married.  Works as a Management consultant.  FAMILY HISTORY:  No one else at home is ill.  REVIEW OF SYSTEMS:  Denies the following:  Cough, sore throat, nasal congestion, abdominal pain, hematuria, bright red blood per rectum, chest pain, seizure, loss of consciousness, double vision, blurry vision, arthralgias, vomiting, and diarrhea.  PHYSICAL EXAMINATION:  VITAL SIGNS:  Blood pressure 142/85, heart rate 111, respiratory rate 24, temperature is 101.2.  GENERAL:  In no distress.  SKIN:  There is a mild erythematous rash on the upper back.  HEENT:  Head is atraumatic.  Sclerae nonicteric.  Pharynx clear.  NECK:  Supple.  CHEST:  Clear to auscultation.  There is no chest wall tenderness.  CARDIOVASCULAR:  No JVD.  No edema.  Tachycardic.  Regular rhythm.  No murmur. Pedal pulses are intact.  ABDOMEN:  Soft, obese, nontender.  No hepatosplenomegaly.  No mass.  The obesity limits the examination.  GENITOURINARY/RECTAL:  Examination not done at this time due to patient condition.  EXTREMITIES:  On the left great toe there  is a 2 cm in diameter skin avulsion.  NEUROLOGIC:  Alert, well oriented.  Cranial nerves appear to be intact, and the patient moves all fours.  LABORATORY DATA:  Oxygen saturation on room air is 96%.  IMPRESSION: 1. Fever of uncertain etiology. 2. Skin rash of uncertain etiology. 3. Headache of uncertain etiology.  PLAN: 1. Blood cultures. 2. Antibiotics. 3. Intravenous fluids. 4. Hand-held nebulizer therapy. 5. Check chest x-ray and laboratories. 6. Electrocardiogram. 7. Symptomatic therapy.Dictated by:   Justine Null, M.D. LHC Attending Physician:  Justine Null DD:  07/04/02 TD:  07/08/02 Job: [id number]

## 2011-05-04 NOTE — Consult Note (Signed)
NAME:  Alex Bryant, Alex Bryant                          ACCOUNT NO.:  [id number]   MEDICAL RECORD NO.:  [id number]                   PATIENT TYPE:  AMB   LOCATION:  DAY                                  FACILITY:  [facility]   PHYSICIAN:  Dorthula Matas, D.D.S.           DATE OF BIRTH:  1950-12-28   DATE OF CONSULTATION:  02/17/2003  DATE OF DISCHARGE:                                   CONSULTATION   HISTORY OF PRESENT ILLNESS:  The patient is a 60 year old male who came into  the [facility] Emergency Room on February 13, 2003.  He had been involved in  a fall down some stairs on the evening of February 12, 2003.  On the morning  of Saturday, February 28 he was taken to the emergency room by his wife due  to facial pain and due to throat pain.  In the emergency room he was seen by  an orthopedist and was noted to have a fracture in the foot and thus had a  cast placed for closed reduction of the foot fracture.  The patient was  transferred to my office that morning secondary to a CT scan which revealed  a zygomatic arch fracture on the right side as well as a zygoma fracture.  On examination the patient had intact extraocular movements of the right and  left eyes.  There was no blurred vision and no eye signs of significance.  The patient did have a palpable fracture along the anterior lateral orbital  rim area and also along the lateral orbital rim region.  The patient also  had dimpling of the cheek which would be caused by the zygomatic arch  depressed fracture and also had inability to open his mouth greater than 20-  25 mm.  When he tried to open at this level there was pain.  On review of  the CT scan it was noted that patient had a right zygoma fracture as well as  a depressed right zygomatic arch fracture.  I placed the patient on  antibiotics, informed him not to blow the nose, and also had him get an  antihistamine/decongestant.  The patient also is not to chew.  He is to  return to my  office on Monday morning for an abbreviated H&P and for  scheduling for reduction of the zygoma/zygomatic arch fractures.  The  patient reported to my office on February 15, 2003 and at that time an H&P was  completed and the patient was scheduled for a reduction of the zygoma and  zygomatic arch fractures on February 17, 2003 at [facility] OR.  I  explained to the patient and his wife that we would probably need to make a  lateral brow incision to reduce the zygoma fracture as well as to elevate  the zygomatic arch fractures.  I also explained to him the possibility that  he might have to  have an inferior lid incision and/or an intraoral incision.  The patient understands the risks related to procedure and also understands  that there will be an external scar if incisions on the face need to be  made.  Currently, he is not numb on the cheek but I have informed him that  that also is a risk.  He is aware of the risk of infection, swelling,  bruising, need for further surgical care, malunion or nonunion of bone  segments, possible sinus problems, possible cosmetic changes to the facial  region.  The plan is to proceed with surgical care at [facility]  on February 17, 2003.                                               Dorthula Matas, D.D.S.    SWS/MEDQ  D:  02/17/2003  T:  02/17/2003  Job:  [id number]

## 2011-05-04 NOTE — Letter (Signed)
August 30, 2006     Alex Bryant. Alex Chance, MD  [address]  [place], [place] [address]   RE:  Alex Bryant, Alex Bryant  MRN:  [id number]  /  DOB:  [birth year]   Dear Alex Bryant,   Would you be kind enough to see Alex Bryant in consultation.  I saw him on  August 28, 2006 complaining of intermittent rectal bleeding over the  prior 2 weeks. His weight was stable but Hemoccult card was positive.  Hemorrhoidal tags were noted.   Significantly he was found to have a polyp at colonoscopy in 2004.  He had a  complete physical examination on August 21, 2006 and on that date  Hemoccult testing was negative.   There is no family history of GI disease.  He has no other GI symptoms. He  does state that in 2003 he had rectal prolapse following severe constipation  with narcotics administered for a fracture of his cheek and foot.   At the time of physical exam he had findings of classic tenosynovitis for  which Indocin and glucosamine were prescribed.   He was found to have significant dyslipidemia and these risks were discussed  with him.  It is his choice to restrict carbohydrates and increase exercise  with follow up routine lipid panel in March, 2008.  His hemoglobin A1C was  5.7 and this will also be reevaluated after carbohydrate and high fructose  corn syrup restriction.   He has been followed by Dr. Corliss Bryant for gout but has no active symptoms at  this time. He remains on Allopurinol.   Thank you for your evaluation and recommendations.    Sincerely,      Alex Bryant. Alex Ren, MD,FACP,FCCP   WFH/MedQ  DD:  08/30/2006  DT:  08/30/2006  Job #:  [id number]

## 2011-05-04 NOTE — Discharge Summary (Signed)
[place]. [facility]  Patient:    MOSIAH, BASTIN Visit Number: [id number] MRN: [id number]          Service Type: MED Location: [phone] Attending Physician:  Ronaldo Miyamoto Dictated by:   Arturo Morton Riley Kill, M.D. LHC Admit Date:  08/14/2001 Discharge Date: 08/15/2001                             Discharge Summary  DISCHARGE DIAGNOSIS:  Chest pain of uncertain cause, likely musculoskeletal.  HISTORY OF PRESENT ILLNESS:  The patient is a delightful, 60 year old, white male with no known prior history of coronary artery disease who presented to the emergency room at [facility] with chest discomfort.  The discomfort had awakened him at 1:49 a.m. and was rated as a 5/10.  The discomfort was relieved somewhat by nitroglycerin in the emergency room.  He did not have significant diaphoresis or nausea and the pain was located predominately in the anterior portion of the left axilla.  PAST MEDICAL HISTORY:  Unremarkable with no prior history of diabetes or hypertension.  He does not smoke.  PAST SURGICAL HISTORY:  None.  FAMILY HISTORY:  His father is alive at age 66.  He had a myocardial infarction at 60.  He had a grandmother who also had coronary artery disease.  SOCIAL HISTORY:  The patient is married.  He does not smoke.  He owns a [employer].  REVIEW OF SYSTEMS:  Unremarkable.  PHYSICAL EXAMINATION:  GENERAL:  Well-developed, well-nourished male in no acute distress.  VITAL SIGNS:  Temperature 97.0, blood pressure 137/84, pulse 58, respiratory rate 20, 97% saturations on room air.  HEENT:  Pupils are equal, round and reactive to light and accommodation. Extraocular movements intact.  There was no jugular venous distention or carotid bruit.  No thyromegaly was noted.  CHEST:  Clear to auscultation and percussion.  CARDIAC:  Rhythm was regular without significant gallop.  ABDOMEN:  Soft and nontender.  LABORATORY  DATA AND X-RAY FINDINGS:  Initial hemoglobin was 16 with a hematocrit of 46.0, platelet count 242,000.  BUN 15, creatinine 1.0, potassium 4.1.  Liver enzymes were normal.  CPK x 2 revealed CPK of 65 with MB of 1.3 and 60 and 1.2.  Lipid profile revealed a cholesterol of 180 with triglycerides of 148, HDL 35, LDL cholesterol was 115.  TSH was 1.37.  Chest x-ray revealed no active disease.  EKG showed normal sinus rhythm, essentially within normal limits.  Initial EKG revealed unusual P axis with a PR interval of 167 msec.  HOSPITAL COURSE:  The patient was admitted.  He had been playing golf on a regular basis.  With negative enzymes, we elected to perform an exercise tolerance test.  He exercised for 11 minutes on the Bruce protocol and experienced no chest pain.  There was no significant ST depression at 80 msec following the J point.  Cardiolite study revealed no evidence of significant ischemia.  With this, he was discharged from the hospital.  DISCHARGE MEDICATIONS: 1. Aspirin one daily. 2. Darvocet-N 100 as needed.  FOLLOWUP:  The patient will follow up with Dr. Lona Kettle on September 5, at 9:45 a.m.  He will be seen in the cardiology clinic on a p.r.n. basis. Dictated by:   Arturo Morton Riley Kill, M.D. LHC Attending Physician:  Ronaldo Miyamoto DD:  08/28/01 TD:  08/28/01 Job: [id number]

## 2011-05-04 NOTE — Discharge Summary (Signed)
[place]. [facility]  Patient:    Alex Bryant, Alex Bryant Visit Number: [id number] MRN: [id number]          Service Type: MED Location: [phone] Attending Physician:  Justine Null Dictated by:   Cornell Barman, P.A. Admit Date:  07/04/2002 Discharge Date: 07/08/2002   CC:         Fransisco Hertz, M.D.  Titus Dubin. Alwyn Ren, M.D. [facility]   Discharge Summary  DISCHARGE DIAGNOSES: 1. Fever. 2. Leukopenia. 3. Thrombocytopenia. 4. Hepatitis. 5. Human monocytic auriculosis.  BRIEF ADMISSION HISTORY:  Mr. Hanawalt is a 60 year old white male who presents with a one week history of fever.  He complains of associated severe headache with dyspnea.  He also complains of a non-pruritic rash on his back.  He also has some nausea.  He had be treated with a Z-Pak.  PAST MEDICAL HISTORY:  Unremarkable.  HOSPITAL COURSE:  Problem #1 ID:  The patient presented with a constellation of symptoms including fever, headache, leukopenia, thrombocytopenia, and hepatitis.  We did ask for an infectious disease consult.  The patient was seen by Dr. Maurice March.  He felt the findings of fever, headache, hepatitis, thrombocytopenia, neutropenia, and rash fit best with auriculosis.  He also felt that [facility] spotted fever or other viral illness such as CMV or primary HIV could be the etiology.  The patient was empirically started on doxycycline and blood work was obtained.  The patients symptoms were felt to be most consistent with a picture of human monocytic auriculosis.  Currently those titers are pending.  The patients hepatitis serologies were negative. The patients West Nile virus, IgG and IgM were negative.  Epstein-Barr virus was negative.  CMV was also negative.  Titers for auriculosis and [facility] spotted fever are pending; however, the patient has been empirically treated with doxycycline.  Problem #2 Hepatitis:  Again, thought to be secondary to his primary  process and his LFTs are normalizing.  Problem #3 Thrombocytopenia:  Also thought secondary to the primary process and also is improving.  LABORATORIES AT DISCHARGE:  West Nile virus, IgG and IgM negative.  Hepatitis serologies negative.  Hemoglobin A-1-C 6%.  White count was 8.7, hemoglobin 12.2 and platelet count was 133,000. AST was 231, ALT 474 and ALP 331.  Direct bilirubin 0.5 otherwise bilirubins were normal.  MEDICATIONS AT DISCHARGE:  Doxycycline 100 mg b.i.d. for seven days. Dictated by:   Cornell Barman, P.A. Attending Physician:  Justine Null DD:  07/08/02 TD:  07/13/02 Job: [id number]

## 2011-07-24 ENCOUNTER — Other Ambulatory Visit: Payer: Self-pay | Admitting: Internal Medicine

## 2011-08-01 ENCOUNTER — Other Ambulatory Visit: Payer: Self-pay | Admitting: Internal Medicine

## 2011-08-01 MED ORDER — CLONAZEPAM 0.5 MG PO TABS: ORAL_TABLET | ORAL | Status: DC

## 2011-08-01 NOTE — Telephone Encounter (Signed)
Rx faxed

## 2011-09-10 ENCOUNTER — Other Ambulatory Visit: Payer: Self-pay | Admitting: Internal Medicine

## 2011-09-18 ENCOUNTER — Other Ambulatory Visit: Payer: Self-pay | Admitting: Internal Medicine

## 2011-09-18 MED ORDER — CLONAZEPAM 0.5 MG PO TABS: ORAL_TABLET | ORAL | Status: DC

## 2011-09-18 NOTE — Telephone Encounter (Signed)
RX faxed

## 2011-10-10 ENCOUNTER — Other Ambulatory Visit: Payer: Self-pay | Admitting: Internal Medicine

## 2011-10-29 ENCOUNTER — Encounter: Payer: Self-pay | Admitting: Internal Medicine

## 2011-10-30 ENCOUNTER — Ambulatory Visit (INDEPENDENT_AMBULATORY_CARE_PROVIDER_SITE_OTHER): Payer: No Typology Code available for payment source | Admitting: Internal Medicine

## 2011-10-30 ENCOUNTER — Encounter: Payer: Self-pay | Admitting: Internal Medicine

## 2011-10-30 DIAGNOSIS — E782 Mixed hyperlipidemia: Secondary | ICD-10-CM

## 2011-10-30 DIAGNOSIS — E119 Type 2 diabetes mellitus without complications: Secondary | ICD-10-CM

## 2011-10-30 DIAGNOSIS — I10 Essential (primary) hypertension: Secondary | ICD-10-CM

## 2011-10-30 DIAGNOSIS — M109 Gout, unspecified: Secondary | ICD-10-CM

## 2011-10-30 MED ORDER — CLONAZEPAM 0.5 MG PO TABS: ORAL_TABLET | ORAL | Status: DC

## 2011-10-30 MED ORDER — SIMVASTATIN 20 MG PO TABS: 20.0000 mg | ORAL_TABLET | Freq: Every day | ORAL | Status: DC

## 2011-10-30 MED ORDER — SITAGLIPTIN PHOSPHATE 100 MG PO TABS: 100.0000 mg | ORAL_TABLET | Freq: Every day | ORAL | Status: DC

## 2011-10-30 MED ORDER — METFORMIN HCL ER 500 MG PO TB24: ORAL_TABLET | ORAL | Status: DC

## 2011-10-30 MED ORDER — RAMIPRIL 5 MG PO CAPS
5.0000 mg | ORAL_CAPSULE | Freq: Every day | ORAL | Status: DC
Start: 2011-10-30 — End: 2012-11-10

## 2011-10-30 NOTE — Patient Instructions (Signed)
Annual  retinal assessments are indicated because the diabetes.

## 2011-10-30 NOTE — Progress Notes (Signed)
Subjective:    Patient ID: Alex Bryant, male    DOB: 05/19/1951, 60 y.o.   MRN: [id number]  HPI Diabetes status assessment: Fasting or morning glucose range:  FBS 90-160 or average :  ? 110  . Highest glucose 2 hours after any meal:  < 170. Hypoglycemia :  no .                                                     Excess thirst ; hunger;urination:  no.                                  Lightheadedness with standing:  no. Chest pain:  no ; Palpitations :no ;  Pain in  calves with walking:  no .                                                                                                                                Non healing skin  ulcers or sores,especially over the feet:  no. Numbness or tingling or burning in feet : no .                                                                                                                                              Significant change in  Weight : down 12 # with diet/ exercise. Vision changes : no; last Ophth exam 18 mos ago  .                                                                    Exercise : 3X/ week for 60 min as cardio . Nutrition/diet:  Decreased red meat. Medication compliance : yes. Medication adverse  Effects:  no . Foot care : no.  A1c/ urine microalbumin monitor:  6.6 % in 1/12 FH: sister pre Diabetes  Recent respiratory tract infection Onset/symptoms:2 weeks ago as ST, PNDrainage Treatments/response:IM steroid for throat swelling; Z pack Present symptoms: Fever/chills/sweats:no Frontal headache:no Facial pain:no Nasal purulence:intermittent Sore throat:no Dental pain:no Lymphadenopathy:no Wheezing/shortness of breath:no Cough/sputum/hemoptysis:no          Review of Systems His gout has been quiescent for over a year; he relates this to improved diet. He is not on allopurinol.     Objective:   Physical Exam Gen.: Healthy and well-nourished in appearance. Alert, appropriate and cooperative  throughout exam. Eyes: No corneal or conjunctival inflammation noted. Pupils equal round reactive to light and accommodation. Fundal exam is benign without hemorrhages, exudate, papilledema. Minimal arteriolar narrowing. Extraocular motion intact. Ears: External  ear exam reveals no significant lesions or deformities. Canals clear .TMs  Essentially normal. Hearing is grossly normal bilaterally. Nose: External nasal exam reveals no deformity or inflammation. Nasal mucosa are pink and moist. No lesions or exudates noted.  Mouth: Oral mucosa and oropharynx reveal no lesions or exudates. Teeth in good repair. Neck: No deformities, masses, or tenderness noted.  Thyroid normal. Lungs: Normal respiratory effort; chest expands symmetrically. Lungs are clear to auscultation without rales, wheezes, or increased work of breathing. Heart: Normal rate and rhythm. Normal S1 and S2. No gallop, click, or rub. S 4 w/o  murmur. Abdomen: Bowel sounds normal; abdomen soft and nontender. No masses, organomegaly or hernias noted.                                                                 Musculoskeletal/extremities: No clubbing, cyanosis, edema, or deformity noted. Range of motion  normal .Tone & strength  normal.Joints normal. Nail health  good. Vascular: Carotid, radial artery, dorsalis pedis and  posterior tibial pulses are full and equal. No bruits present. Neurologic: Alert and oriented x3. Deep tendon reflexes symmetrical and normal. Light touch normal over feet.         Skin: Intact without suspicious lesions or rashes. Lymph: No cervical, axillary  lymphadenopathy present. Psych: Mood and affect are normal. Normally interactive                                                                                         Assessment & Plan:  #1 diabetes; all measurements suggest good control  #2 hypertension, well-controlled  #3 dyslipidemia, lipids were goal in January except for HDL less than 40  #4  gout quiescent without medications  Plan: See orders and recommendations

## 2011-11-06 ENCOUNTER — Other Ambulatory Visit: Payer: Self-pay | Admitting: Internal Medicine

## 2011-11-06 MED ORDER — ZOSTER VACCINE LIVE 19400 UNT/0.65ML ~~LOC~~ SOLR: 0.6500 mL | Freq: Once | SUBCUTANEOUS | Status: DC

## 2011-11-06 NOTE — Telephone Encounter (Signed)
RX sent

## 2011-11-22 ENCOUNTER — Other Ambulatory Visit: Payer: Self-pay | Admitting: Internal Medicine

## 2011-11-22 DIAGNOSIS — M109 Gout, unspecified: Secondary | ICD-10-CM

## 2011-11-22 DIAGNOSIS — I10 Essential (primary) hypertension: Secondary | ICD-10-CM

## 2011-11-22 DIAGNOSIS — E119 Type 2 diabetes mellitus without complications: Secondary | ICD-10-CM

## 2011-11-22 DIAGNOSIS — E782 Mixed hyperlipidemia: Secondary | ICD-10-CM

## 2011-11-23 ENCOUNTER — Other Ambulatory Visit (INDEPENDENT_AMBULATORY_CARE_PROVIDER_SITE_OTHER): Payer: No Typology Code available for payment source

## 2011-11-23 DIAGNOSIS — E119 Type 2 diabetes mellitus without complications: Secondary | ICD-10-CM

## 2011-11-23 DIAGNOSIS — I10 Essential (primary) hypertension: Secondary | ICD-10-CM

## 2011-11-23 DIAGNOSIS — E782 Mixed hyperlipidemia: Secondary | ICD-10-CM

## 2011-11-23 DIAGNOSIS — M109 Gout, unspecified: Secondary | ICD-10-CM

## 2011-11-23 LAB — TSH: TSH: 0.92 u[IU]/mL (ref 0.35–5.50)

## 2011-11-23 LAB — BASIC METABOLIC PANEL
BUN: 19 mg/dL (ref 6–23)
Calcium: 8.8 mg/dL (ref 8.4–10.5)
GFR: 70.61 mL/min (ref >60.00)
Glucose, Bld: 140 mg/dL — ABNORMAL HIGH (ref 70–99)

## 2011-11-23 LAB — MICROALBUMIN / CREATININE URINE RATIO
Creatinine,U: 159.1 mg/dL
Microalb Creat Ratio: 1.1 mg/g (ref 0.0–30.0)

## 2011-11-23 LAB — HEPATIC FUNCTION PANEL
AST: 20 U/L (ref 0–37)
Alkaline Phosphatase: 63 U/L (ref 39–117)
Bilirubin, Direct: 0 mg/dL (ref 0.0–0.3)
Total Bilirubin: 0.4 mg/dL (ref 0.3–1.2)

## 2011-11-23 NOTE — Progress Notes (Signed)
12  

## 2011-11-28 ENCOUNTER — Other Ambulatory Visit: Payer: Self-pay | Admitting: Internal Medicine

## 2012-03-25 ENCOUNTER — Ambulatory Visit (INDEPENDENT_AMBULATORY_CARE_PROVIDER_SITE_OTHER): Payer: No Typology Code available for payment source | Admitting: Internal Medicine

## 2012-03-25 ENCOUNTER — Encounter: Payer: Self-pay | Admitting: Internal Medicine

## 2012-03-25 VITALS — BP 150/80 | HR 71 | Temp 98.0°F | Wt 223.0 lb

## 2012-03-25 DIAGNOSIS — IMO0002 Reserved for concepts with insufficient information to code with codable children: Secondary | ICD-10-CM

## 2012-03-25 DIAGNOSIS — M5414 Radiculopathy, thoracic region: Secondary | ICD-10-CM

## 2012-03-25 MED ORDER — CYCLOBENZAPRINE HCL 5 MG PO TABS: ORAL_TABLET | ORAL | Status: DC

## 2012-03-25 MED ORDER — TRAMADOL HCL 50 MG PO TABS: 50.0000 mg | ORAL_TABLET | Freq: Four times a day (QID) | ORAL | Status: DC | PRN

## 2012-03-25 NOTE — Patient Instructions (Addendum)
The best exercises for the  back include freestyle swimming, stretch aerobics, specific Nautilus machines and yoga.  Order for x-rays entered into  the computer; these will be performed at [facility] across from [facility]. No appointment is necessary.

## 2012-03-25 NOTE — Progress Notes (Signed)
Subjective:    Patient ID: Alex Bryant, male    DOB: 07/02/1951, 61 y.o.   MRN: [id number]  HPI I will He describes intermittent right infrascapular sharp pain over the last 6 months. It's been aggravated by deep breathing, golf  or rotation laterally of his thorax. Nonsteroidals have not been of any benefit.  He states that it reminds him of the pleuritic pain he had with pneumonia on one occasion.  There was no trigger or  injury as a prodrome to the pain.  The pain does not rotate  radiate.  There is no personal or family history of deep venous thrombosis, pulmonary emboli or other clotting disorders. He has no history of back surgery    Review of Systems He denies cough, sputum production or shortness of breath. He has had no numbness or tingling or extremity weakness. He also denies any incontinence of urine or stool.  He has not had associated fever, chills, sweats, or unexplained weight loss.  He has not had any chest pain, palpitations, calf pain or edema  There's been no associated rash in the area of pain  The upper back pain has not been associated with any right upper quadrant pain, change in color of urine or stool or meal-related nausea or pain     Objective:   Physical Exam Gen.: Healthy and well-nourished in appearance. Alert, appropriate and cooperative throughout exam.  Eyes: No corneal or conjunctival inflammation noted. Ptosis bilaterally.No icterusMouth: Oral mucosa and oropharynx reveal no lesions or exudates. Teeth in good repair. Neck: No deformities, masses, or tenderness noted. Range of motion normal. Lungs: Normal respiratory effort; chest expands symmetrically. Lungs are clear to auscultation without rales, wheezes, or increased work of breathing.  Chest: No discomfort with compression of the thorax or percussion over the thoracic spine. Heart: Normal rate and rhythm. Normal S1 and S2. No gallop, click, or rub. S 4. Abdomen: Bowel sounds normal;  abdomen soft and nontender. No masses, organomegaly or hernias noted. Musculoskeletal/extremities: No deformity or scoliosis noted of  the thoracic or lumbar spine; but there is asymmetry of the posterior thoracic muscles. The right paraspinous thoracic muscles are larger than the left which suggests possible subclinical scoliosis. No clubbing, cyanosis, edema, or deformity noted. Range of motion  normal .Tone & strength  normal.Joints normal. Nail health  good. Vascular: Carotid, radial artery, dorsalis pedis and  posterior tibial pulses are full and equal. No bruits present. Homans sign is negative Neurologic: Alert and oriented x3. Deep tendon reflexes symmetrical but 1/2+ at the knees         Skin: Intact without suspicious lesions or rashes. Lymph: No cervical, axillary lymphadenopathy present. Psych: Mood and affect are normal. Normally interactive                                                                                         Assessment & Plan:  #1 right infrascapular pain most likely representing T.-5 radicular pain. Historically and clinically pulmonary process or gallbladder disease is not suggested. This diagnosis is supported by the asymmetry of the thoracic musculature as noted above  Plan: Thoracic spine films will  be initiated. Preventive exercises would be encouraged.

## 2012-04-22 ENCOUNTER — Other Ambulatory Visit: Payer: Self-pay | Admitting: Internal Medicine

## 2012-04-22 MED ORDER — CLONAZEPAM 0.5 MG PO TABS: ORAL_TABLET | ORAL | Status: DC

## 2012-04-22 NOTE — Telephone Encounter (Signed)
Refill for clonazepam 0.5mg  tab qual  Last written 11.13.12 Last qty 90 Last instructions 1/2-1 by mouth at bedtime as needed  Last ov 4.9.13

## 2012-05-13 ENCOUNTER — Other Ambulatory Visit: Payer: Self-pay | Admitting: Internal Medicine

## 2012-07-21 ENCOUNTER — Other Ambulatory Visit: Payer: Self-pay | Admitting: Internal Medicine

## 2012-07-21 NOTE — Telephone Encounter (Signed)
KLONOPIN ORAL TABLET 0.5MG   QTY: 90 TAKE HALF TO ONE TABLET BY MOUTH AT BEDTIME AS NEEDED

## 2012-07-22 MED ORDER — CLONAZEPAM 0.5 MG PO TABS: ORAL_TABLET | ORAL | Status: DC

## 2012-07-22 NOTE — Telephone Encounter (Signed)
RX called in .

## 2012-09-15 ENCOUNTER — Other Ambulatory Visit: Payer: Self-pay | Admitting: Internal Medicine

## 2012-09-15 DIAGNOSIS — E119 Type 2 diabetes mellitus without complications: Secondary | ICD-10-CM

## 2012-09-15 MED ORDER — METFORMIN HCL ER 500 MG PO TB24: ORAL_TABLET | ORAL | Status: DC

## 2012-09-15 NOTE — Telephone Encounter (Signed)
refill metformin hcl er oral tablet extended release 25hr 500mg  #180 take 1 tablet 2 times a day with the 2 largest meals - last ov 4.9.13 acute

## 2012-09-15 NOTE — Telephone Encounter (Signed)
Future order placed for GJ

## 2012-10-20 ENCOUNTER — Telehealth: Payer: Self-pay | Admitting: Internal Medicine

## 2012-10-20 NOTE — Telephone Encounter (Signed)
Refill: Clonazepam 0.5mg  tab. Last fill 09-15-12

## 2012-10-21 MED ORDER — CLONAZEPAM 0.5 MG PO TABS: ORAL_TABLET | ORAL | Status: DC

## 2012-10-21 NOTE — Telephone Encounter (Signed)
RX called in .

## 2012-10-21 NOTE — Telephone Encounter (Signed)
Last filled on 07/22/2012 #90, instructions indicate 1/2-1 by mouth at bedtime as needed   Last OV 03/2012

## 2012-10-21 NOTE — Telephone Encounter (Signed)
Refill #90 

## 2012-10-22 ENCOUNTER — Telehealth: Payer: Self-pay

## 2012-10-22 NOTE — Telephone Encounter (Signed)
We received information from [facility] indicating Freestyle DM testing supplies not covered. Patient needs to check with his insurance company to see what meter they do cover and we can forward rx for that machine and supplies   Side Note: Paper fax is at my desk

## 2012-10-24 NOTE — Telephone Encounter (Signed)
Left message on voicemail for patient to return call when available   

## 2012-10-30 NOTE — Telephone Encounter (Signed)
Left message on voicemail for patient to return call when available   

## 2012-10-30 NOTE — Telephone Encounter (Signed)
Spoke with patient, patient states he will check with insurance company and call me back

## 2012-11-05 NOTE — Telephone Encounter (Signed)
Left message on voicemail informing patient I was calling to follow-up on which meter his insurance company covers. Patient to return call if he is in needed of DM testing supplies

## 2012-11-10 ENCOUNTER — Telehealth: Payer: Self-pay | Admitting: Internal Medicine

## 2012-11-10 DIAGNOSIS — I10 Essential (primary) hypertension: Secondary | ICD-10-CM

## 2012-11-10 MED ORDER — RAMIPRIL 5 MG PO CAPS: 5.0000 mg | ORAL_CAPSULE | Freq: Every day | ORAL | Status: DC

## 2012-11-10 NOTE — Telephone Encounter (Signed)
Refill: Ramipril oral cap 5 mg. Take one capsule by mouth one time daily. Qty 90. Last fill 10-09-12

## 2012-11-10 NOTE — Telephone Encounter (Signed)
RX sent

## 2012-11-11 ENCOUNTER — Other Ambulatory Visit: Payer: Self-pay | Admitting: Internal Medicine

## 2012-11-11 MED ORDER — SIMVASTATIN 20 MG PO TABS: ORAL_TABLET | ORAL | Status: DC

## 2012-11-11 NOTE — Telephone Encounter (Signed)
RX sent in

## 2012-11-11 NOTE — Telephone Encounter (Signed)
refill  ZOCOR 20 MG TAKE ONE TABLET BY MOUTH AT BEDTIME #90 wt/1-refill last ov 4.9.13 acute

## 2012-12-17 HISTORY — PX: OTHER SURGICAL HISTORY: SHX169

## 2013-01-19 ENCOUNTER — Telehealth: Payer: Self-pay | Admitting: Internal Medicine

## 2013-01-19 NOTE — Telephone Encounter (Signed)
Left message on Voicemail for patient to return call when available. Reason for call: patient will need to stop be the office to sign controlled substance contract and pick up rx

## 2013-01-19 NOTE — Telephone Encounter (Signed)
Refill: Clonazepam oral tablet 0.5mg . Take half to one tablet by mouth at bedtime as needed. Qty 90. Last fill 12-17-12

## 2013-01-20 MED ORDER — CLONAZEPAM 0.5 MG PO TABS: ORAL_TABLET | ORAL | Status: DC

## 2013-01-20 NOTE — Telephone Encounter (Signed)
Patient aware Controlled Substance Contract to be sign and rx to be picked up   

## 2013-02-12 ENCOUNTER — Encounter: Payer: Self-pay | Admitting: Internal Medicine

## 2013-02-18 ENCOUNTER — Other Ambulatory Visit: Payer: Self-pay | Admitting: Internal Medicine

## 2013-02-18 NOTE — Telephone Encounter (Signed)
Lipid/Hep 272.4/995.20  

## 2013-03-03 ENCOUNTER — Other Ambulatory Visit: Payer: Self-pay | Admitting: Internal Medicine

## 2013-03-03 NOTE — Telephone Encounter (Signed)
a1c 250.00  

## 2013-03-19 ENCOUNTER — Telehealth: Payer: Self-pay | Admitting: Internal Medicine

## 2013-03-19 DIAGNOSIS — E785 Hyperlipidemia, unspecified: Secondary | ICD-10-CM

## 2013-03-19 DIAGNOSIS — T887XXA Unspecified adverse effect of drug or medicament, initial encounter: Secondary | ICD-10-CM

## 2013-03-19 NOTE — Telephone Encounter (Signed)
Patient aware # 30 to be given and labs overdue. Future orders placed, patient aware he can walk in at [facility] for labs (patient instructed to fast)

## 2013-03-19 NOTE — Telephone Encounter (Signed)
Refill: Simvastatin 20 mg tablets. Take 1 tablet by mouth daily. Qty 30. Last fill 02-18-13

## 2013-04-13 ENCOUNTER — Other Ambulatory Visit (INDEPENDENT_AMBULATORY_CARE_PROVIDER_SITE_OTHER): Payer: No Typology Code available for payment source

## 2013-04-13 DIAGNOSIS — E119 Type 2 diabetes mellitus without complications: Secondary | ICD-10-CM

## 2013-04-13 DIAGNOSIS — T887XXA Unspecified adverse effect of drug or medicament, initial encounter: Secondary | ICD-10-CM

## 2013-04-13 DIAGNOSIS — E785 Hyperlipidemia, unspecified: Secondary | ICD-10-CM

## 2013-04-13 LAB — LIPID PANEL
Cholesterol: 132 mg/dL (ref 0–200)
LDL Cholesterol: 58 mg/dL (ref 0–99)
Total CHOL/HDL Ratio: 4

## 2013-04-13 LAB — HEPATIC FUNCTION PANEL
Alkaline Phosphatase: 73 U/L (ref 39–117)
Bilirubin, Direct: 0.1 mg/dL (ref 0.0–0.3)

## 2013-04-14 ENCOUNTER — Other Ambulatory Visit: Payer: Self-pay | Admitting: Internal Medicine

## 2013-04-15 ENCOUNTER — Encounter: Payer: Self-pay | Admitting: *Deleted

## 2013-04-22 ENCOUNTER — Other Ambulatory Visit: Payer: Self-pay | Admitting: Internal Medicine

## 2013-04-22 NOTE — Telephone Encounter (Signed)
Med filled, pt had recent labs last week and will call to schedule an appt as soon as he gets near his schedule.

## 2013-05-15 ENCOUNTER — Other Ambulatory Visit: Payer: Self-pay | Admitting: Internal Medicine

## 2013-05-15 NOTE — Telephone Encounter (Signed)
Schedule CPX 

## 2013-05-21 ENCOUNTER — Other Ambulatory Visit: Payer: Self-pay | Admitting: Internal Medicine

## 2013-05-25 ENCOUNTER — Encounter: Payer: Self-pay | Admitting: Family Medicine

## 2013-05-25 ENCOUNTER — Ambulatory Visit (INDEPENDENT_AMBULATORY_CARE_PROVIDER_SITE_OTHER): Payer: BC Managed Care – PPO | Admitting: Family Medicine

## 2013-05-25 ENCOUNTER — Ambulatory Visit
Admission: RE | Admit: 2013-05-25 | Discharge: 2013-05-25 | Disposition: A | Discharge status: Home / Self Care | Payer: BC Managed Care – PPO | Source: Ambulatory Visit | Attending: Family Medicine | Admitting: Family Medicine

## 2013-05-25 VITALS — BP 147/87 | HR 58 | Ht 72.0 in | Wt 223.0 lb

## 2013-05-25 DIAGNOSIS — M5410 Radiculopathy, site unspecified: Secondary | ICD-10-CM

## 2013-05-25 DIAGNOSIS — IMO0002 Reserved for concepts with insufficient information to code with codable children: Secondary | ICD-10-CM

## 2013-05-25 DIAGNOSIS — M47817 Spondylosis without myelopathy or radiculopathy, lumbosacral region: Secondary | ICD-10-CM | POA: Insufficient documentation

## 2013-05-25 MED ORDER — CYCLOBENZAPRINE HCL 10 MG PO TABS: ORAL_TABLET | ORAL | Status: DC

## 2013-05-25 NOTE — Patient Instructions (Addendum)
You have been scheduled for an appointment for MRI 05/28/13 at [facility] [address].  Please arrive at 7:15pm for a 7:45pm appointment.

## 2013-05-26 ENCOUNTER — Encounter: Payer: Self-pay | Admitting: Family Medicine

## 2013-05-26 ENCOUNTER — Other Ambulatory Visit: Payer: Self-pay | Admitting: *Deleted

## 2013-05-26 ENCOUNTER — Telehealth: Payer: Self-pay | Admitting: Family Medicine

## 2013-05-26 MED ORDER — GABAPENTIN 300 MG PO CAPS: ORAL_CAPSULE | ORAL | Status: DC

## 2013-05-26 NOTE — Telephone Encounter (Signed)
Called and gave him x-ray results.  Pt called earlier stating he was still having significant pain flexeril not helping. Per Dr. Jennette Kettle sent in gabapentin 300 mg.  Pt advised to take qhs x 4 nights and if not significant relief then take 1 q am and 1 qhs.  Advised Dr. Jennette Kettle will be in touch after MRI.

## 2013-05-26 NOTE — Telephone Encounter (Signed)
Amy,  B. call him and tell him that the x-ray showed some mild to moderate arthritic changes as we expected but nothing really outstanding or worrisome. I'll call him after the MRI . THANKS! Denny Levy

## 2013-05-26 NOTE — Progress Notes (Signed)
  Subjective:    Patient ID: Alex Bryant, male    DOB: [birth year], 62 y.o.   MRN: [id number]  HPI Pain in right leg radiating from the buttock down to mid thigh and occasionally down to mid calf. Worse in the morning he first awakens. He is having trouble getting out of bed in the morning secondary to pain in the feels like weakness of the leg. Pain is worsening over the last 2 weeks, 9/10 when he awakens. Adhesive been moving a get some better. Leg does feel weak these had no falls. Continues to be active. No incontinence of bowel or bladder. No recent back injury that he is aware of. Avid golfer.   PERTINENT  PMH / PSH: Diabetes mellitus Hypertension  Review of Systems Denies numbness or tingling in the foot. No foot drop. Denies fever, sweats, chills, unusual weight change.    Objective:   Physical Exam Vital signs reviewed and noted elevated systolic blood pressure To GENERAL: Well-developed overweight male no acute distress HIPS: Internal/external rotation full and painless. Hip flexor and extensor strength 5 out of 5 and painless. Lower extremity strength 5 out of 5 as his plantarflexion and dorsiflexion. BUTTOCK: Right. Nontender to palpation in the gluteus muscles. No tenderness to palpation of the area around the greater trochanteric bursa. NEURO: Straight leg raise negative bilaterally except for some hamstring tightness. DTRs 1+ bilaterally at the knee. Soft touch sensation intact lower extremities       Assessment & Plan:  Progressively worsening radicular right leg pain. Some questionable history of arthritic change or spinal stenosis by his report. He has not had any recent imaging. We discussed options. He would like to proceed immediately with MRI which we will do after eating x-rays. I will start him on Flexeril at night for some pain relief. That's not helping things all have him call us and we'll consider Neurontin. I'll call him after the MRI and after the x-rays

## 2013-05-28 ENCOUNTER — Ambulatory Visit
Admission: RE | Admit: 2013-05-28 | Discharge: 2013-05-28 | Disposition: A | Discharge status: Home / Self Care | Payer: BC Managed Care – PPO | Source: Ambulatory Visit | Attending: Family Medicine | Admitting: Family Medicine

## 2013-05-28 DIAGNOSIS — M5410 Radiculopathy, site unspecified: Secondary | ICD-10-CM

## 2013-06-02 ENCOUNTER — Telehealth: Payer: Self-pay | Admitting: *Deleted

## 2013-06-02 MED ORDER — PREDNISONE 10 MG PO KIT: PACK | ORAL | Status: DC

## 2013-06-02 NOTE — Telephone Encounter (Signed)
Spoke with pt- advised him to start prednisone, and he should be receiving a call from [facility] re: an appointment.  Pt to call back if prednisone not helping in the next few days.

## 2013-06-02 NOTE — Telephone Encounter (Signed)
Pt has arthritic changes in his back per Dr. Jennette Kettle.  She referred him to [facility], and ordered sterapred 12 day dosepack.  Called patient to advise him of results, left VM to return my call.

## 2013-06-02 NOTE — Telephone Encounter (Signed)
Message copied by Mora Bellman on Tue Jun 02, 2013 10:51 AM ------      Message from: Claiborne Billings      Created: Mon Jun 01, 2013  1:52 PM      Regarding: MRI Results      Contact: [phone]       Please call with MRI results.  Also in a lot of acute pain. ------

## 2013-06-03 ENCOUNTER — Encounter: Payer: Self-pay | Admitting: Family Medicine

## 2013-07-07 ENCOUNTER — Other Ambulatory Visit: Payer: Self-pay | Admitting: Neurological Surgery

## 2013-07-07 DIAGNOSIS — M4310 Spondylolisthesis, site unspecified: Secondary | ICD-10-CM

## 2013-07-10 ENCOUNTER — Ambulatory Visit
Admission: RE | Admit: 2013-07-10 | Discharge: 2013-07-10 | Disposition: A | Discharge status: Home / Self Care | Payer: BC Managed Care – PPO | Source: Ambulatory Visit | Attending: Neurological Surgery | Admitting: Neurological Surgery

## 2013-07-10 DIAGNOSIS — M4310 Spondylolisthesis, site unspecified: Secondary | ICD-10-CM

## 2013-07-10 MED ORDER — IOHEXOL 180 MG/ML  SOLN
1.0000 mL | Freq: Once | INTRAMUSCULAR | Status: AC | PRN
  Administered 2013-07-10: 1 mL via EPIDURAL

## 2013-07-10 MED ORDER — METHYLPREDNISOLONE ACETATE 40 MG/ML INJ SUSP (RADIOLOG
120.0000 mg | Freq: Once | INTRAMUSCULAR | Status: AC
  Administered 2013-07-10: 120 mg via EPIDURAL

## 2013-07-23 ENCOUNTER — Other Ambulatory Visit: Payer: Self-pay | Admitting: Neurological Surgery

## 2013-07-23 DIAGNOSIS — M549 Dorsalgia, unspecified: Secondary | ICD-10-CM

## 2013-07-27 ENCOUNTER — Other Ambulatory Visit: Payer: Self-pay | Admitting: Neurological Surgery

## 2013-07-27 ENCOUNTER — Ambulatory Visit
Admission: RE | Admit: 2013-07-27 | Discharge: 2013-07-27 | Disposition: A | Discharge status: Home / Self Care | Payer: BC Managed Care – PPO | Source: Ambulatory Visit | Attending: Neurological Surgery | Admitting: Neurological Surgery

## 2013-07-27 DIAGNOSIS — M549 Dorsalgia, unspecified: Secondary | ICD-10-CM

## 2013-07-27 MED ORDER — METHYLPREDNISOLONE ACETATE 40 MG/ML INJ SUSP (RADIOLOG
120.0000 mg | Freq: Once | INTRAMUSCULAR | Status: AC
  Administered 2013-07-27: 120 mg via EPIDURAL

## 2013-07-27 MED ORDER — IOHEXOL 180 MG/ML  SOLN
1.0000 mL | Freq: Once | INTRAMUSCULAR | Status: AC | PRN
  Administered 2013-07-27: 1 mL via EPIDURAL

## 2013-08-17 ENCOUNTER — Other Ambulatory Visit: Payer: Self-pay | Admitting: Internal Medicine

## 2013-08-18 ENCOUNTER — Other Ambulatory Visit: Payer: Self-pay | Admitting: Neurological Surgery

## 2013-08-18 ENCOUNTER — Other Ambulatory Visit: Payer: Self-pay | Admitting: Internal Medicine

## 2013-08-18 DIAGNOSIS — M4310 Spondylolisthesis, site unspecified: Secondary | ICD-10-CM

## 2013-08-18 NOTE — Telephone Encounter (Signed)
Last OV > 1 year ago, ok #15, no RF. Further RF per  PCP

## 2013-08-18 NOTE — Telephone Encounter (Signed)
Last refilled: 05/21/2013, #90  Last Visit: 03/25/2012  UDS-01/20/2013-Low Risk  Please advise

## 2013-08-19 ENCOUNTER — Ambulatory Visit
Admission: RE | Admit: 2013-08-19 | Discharge: 2013-08-19 | Disposition: A | Discharge status: Home / Self Care | Payer: BC Managed Care – PPO | Source: Ambulatory Visit | Attending: Neurological Surgery | Admitting: Neurological Surgery

## 2013-08-19 ENCOUNTER — Other Ambulatory Visit: Payer: Self-pay | Admitting: *Deleted

## 2013-08-19 VITALS — BP 148/82 | HR 67

## 2013-08-19 DIAGNOSIS — G4700 Insomnia, unspecified: Secondary | ICD-10-CM

## 2013-08-19 DIAGNOSIS — M4310 Spondylolisthesis, site unspecified: Secondary | ICD-10-CM

## 2013-08-19 MED ORDER — CLONAZEPAM 0.5 MG PO TABS: ORAL_TABLET | ORAL | Status: DC

## 2013-08-19 MED ORDER — METHYLPREDNISOLONE ACETATE 40 MG/ML INJ SUSP (RADIOLOG
120.0000 mg | Freq: Once | INTRAMUSCULAR | Status: AC
  Administered 2013-08-19: 120 mg via EPIDURAL

## 2013-08-19 MED ORDER — IOHEXOL 180 MG/ML  SOLN
1.0000 mL | Freq: Once | INTRAMUSCULAR | Status: AC | PRN
  Administered 2013-08-19: 1 mL via EPIDURAL

## 2013-08-19 NOTE — Telephone Encounter (Signed)
rx refilled per protocol. DJR  

## 2013-08-19 NOTE — Telephone Encounter (Signed)
Called and LMOVm informing pt need for appt. Last seen 03/2012.

## 2013-09-21 ENCOUNTER — Telehealth: Payer: Self-pay | Admitting: *Deleted

## 2013-09-21 NOTE — Telephone Encounter (Signed)
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 Dr. Cherene Altes in [place] is the doctor performing the surgery and suggested to patient that he inform his PCP of this surgery.

## 2013-09-21 NOTE — Telephone Encounter (Signed)
Called and asked patient if he was need a preoperative medical clearance and patient stated he wasn't for sure. He said the surgery is set up for next Monday. He is going to call the surgeons office to clarify and let us know.

## 2013-09-21 NOTE — Telephone Encounter (Signed)
   He should verify whether his surgeon is requesting a preoperative medical clearance. If so he would need to be seen as his last office visit was in April of 2013.  Refill of medications would be also require followup as well because of this 18 month interval as per the [facility] Medical Board standard of care

## 2013-09-22 ENCOUNTER — Encounter: Payer: Self-pay | Admitting: Internal Medicine

## 2013-09-22 ENCOUNTER — Ambulatory Visit (INDEPENDENT_AMBULATORY_CARE_PROVIDER_SITE_OTHER): Payer: BC Managed Care – PPO | Admitting: Internal Medicine

## 2013-09-22 VITALS — BP 131/72 | HR 74 | Temp 98.4°F | Wt 213.6 lb

## 2013-09-22 DIAGNOSIS — E782 Mixed hyperlipidemia: Secondary | ICD-10-CM

## 2013-09-22 DIAGNOSIS — E119 Type 2 diabetes mellitus without complications: Secondary | ICD-10-CM

## 2013-09-22 DIAGNOSIS — M109 Gout, unspecified: Secondary | ICD-10-CM

## 2013-09-22 DIAGNOSIS — I10 Essential (primary) hypertension: Secondary | ICD-10-CM

## 2013-09-22 DIAGNOSIS — M47817 Spondylosis without myelopathy or radiculopathy, lumbosacral region: Secondary | ICD-10-CM

## 2013-09-22 DIAGNOSIS — G4700 Insomnia, unspecified: Secondary | ICD-10-CM

## 2013-09-22 DIAGNOSIS — D126 Benign neoplasm of colon, unspecified: Secondary | ICD-10-CM

## 2013-09-22 LAB — TSH: TSH: 1 u[IU]/mL (ref 0.35–5.50)

## 2013-09-22 LAB — URIC ACID: Uric Acid, Serum: 6.7 mg/dL (ref 4.0–7.8)

## 2013-09-22 LAB — MICROALBUMIN / CREATININE URINE RATIO
Creatinine,U: 99.8 mg/dL
Microalb Creat Ratio: 1.3 mg/g (ref 0.0–30.0)
Microalb, Ur: 1.3 mg/dL (ref 0.0–1.9)

## 2013-09-22 LAB — BASIC METABOLIC PANEL
CO2: 28 mEq/L (ref 19–32)
Chloride: 104 mEq/L (ref 96–112)
Sodium: 140 mEq/L (ref 135–145)

## 2013-09-22 MED ORDER — CLONAZEPAM 0.5 MG PO TABS: ORAL_TABLET | ORAL | Status: DC

## 2013-09-22 NOTE — Addendum Note (Signed)
Addended by: Verdie Shire on: 09/22/2013 01:59 PM   Modules accepted: Orders

## 2013-09-22 NOTE — Patient Instructions (Addendum)
If you activate the  My Chart system; lab & Xray results will be released directly  to you as soon as I review & address these through the computer. If you choose not to sign up for My Chart within 36 hours of labs being drawn; results will be reviewed & interpretation added before being copied & mailed, causing a delay in getting the results to you.If you do not receive that report within 7-10 days ,please call. Additionally you can use this system to gain direct  access to your records  if  out of town or @ an office of a  physician who is not in  the My Chart network.  This improves continuity of care & places you in control of your medical record.  Share results with Dr Elvina Sidle.You are medically cleared for surgery unless there are significant lab abnormalities.  09/23/13 fair-poor diabetes control with A1c of 7.7%. Sliding scale insulin perioperatively may be necessary to keep the glucose less than 200. Tight control should not be attempted perioperatively. Postoperatively Alex Bryant needs to be seen for additional diabetic medication to improve control. A1c goal is less than 7% as long as there no hypoglycemic episodes.

## 2013-09-22 NOTE — Progress Notes (Signed)
  Subjective:    Patient ID: Alex Bryant, male    DOB: 06-07-1951, 62 y.o.   MRN: [id number]  HPI  Dr Dorita Fray , NS , [facility] plans spinal surgery @L5 - S1 07/29/13 for spondylosis with almost constant sharp pain from R gluteus to lower calf .   Diabetes: Disease monitoring:A1c 6.9 % on 04/13/13 Fasting blood sugar average: 115 Highest glucose two hours post prandially: < 170 Ophthalmologic exam: not current ; no retinopathy to date Podiatry exam:no Medication compliance:yes Hypoglycemia:no  Hyperlipidemia: Disease monitoring: LDL 58  On 04/13/13 Medication compliance:yes  Hypertension: Disease monitoring:  Blood pressure average: 132/82 Medication compliance:yes  Diet/nutrition:heart healthy Exercise:walking 3 mpd 3X/ week Salt restriction:not added        Review of Systems Chest pain, palpitations:no but "racing with stress" Dyspnea:no Lightheadedness, syncope:no Edema:no Claudication:no Polydipsia/polyphagia/polyuria:no Blurred vision/diplopia/loss of vision:no Skin lesions:no Numbness/tingling/pain and extremities:no Abdominal pain/bowel change:no Myalgias:no     Objective:   Physical Exam Gen.: Healthy and well-nourished in appearance. Alert, appropriate and cooperative throughout exam.Appears younger than stated age  Head: Normocephalic without obvious abnormalities; no alopecia  Eyes: No corneal or conjunctival inflammation noted. Pupils equal round reactive to light and accommodation.  Extraocular motion intact.   Nose: External nasal exam reveals no deformity or inflammation. Nasal mucosa are pink and moist. No lesions or exudates noted. Septum minimally dislocated  Mouth: Oral mucosa and oropharynx reveal no lesions or exudates. Teeth in good repair. Neck: No deformities, masses, or tenderness noted. Range of motion & Thyroid normal. Lungs: Normal respiratory effort; chest expands symmetrically. Lungs are clear to auscultation without rales, wheezes, or  increased work of breathing. Heart: Normal rate and rhythm. Normal S1 and S2. No gallop, click, or rub. S4 w/o murmur. Abdomen: Bowel sounds normal; abdomen soft and nontender. No masses, organomegaly or hernias noted.                               Musculoskeletal/extremities: No deformity or scoliosis noted of  the thoracic or lumbar spine.   No clubbing, cyanosis, edema, or significant extremity  deformity noted. Range of motion decreased RLE due to gluteal pain with elevation .Tone & strength  Normal. Joints normal . Nail health good. Able to lie down & sit up w/o help. Negative SLR bilaterally but severe pain R gluteus @ 30 degrees Vascular: Carotid, radial artery, dorsalis pedis and  posterior tibial pulses are full and equal. No bruits present. Neurologic: Alert and oriented x3. Deep tendon reflexes symmetrical and 1/2+ @ knees. Light touch normal over feet.  Gait normal  including heel & toe walking .        Skin: Intact without suspicious lesions or rashes. Lymph: No cervical, axillary lymphadenopathy present. Psych: Mood and affect are normal. Normally interactive                                                                                        Assessment & Plan:  See Current Assessment & Plan in Problem List under specific Diagnosis

## 2013-09-23 ENCOUNTER — Other Ambulatory Visit: Payer: Self-pay | Admitting: *Deleted

## 2013-09-23 ENCOUNTER — Other Ambulatory Visit: Payer: Self-pay | Admitting: Internal Medicine

## 2013-09-23 MED ORDER — SIMVASTATIN 20 MG PO TABS: ORAL_TABLET | ORAL | Status: DC

## 2013-09-23 NOTE — Telephone Encounter (Signed)
Simvastatin refill sent to pharmacy 

## 2013-09-29 DIAGNOSIS — M48062 Spinal stenosis, lumbar region with neurogenic claudication: Secondary | ICD-10-CM | POA: Insufficient documentation

## 2013-09-29 DIAGNOSIS — M4310 Spondylolisthesis, site unspecified: Secondary | ICD-10-CM | POA: Insufficient documentation

## 2013-10-17 HISTORY — PX: POSTERIOR LUMBAR FUSION: SHX6036

## 2013-10-22 ENCOUNTER — Other Ambulatory Visit: Payer: Self-pay

## 2013-11-18 ENCOUNTER — Encounter: Payer: Self-pay | Admitting: Internal Medicine

## 2013-11-18 ENCOUNTER — Ambulatory Visit (INDEPENDENT_AMBULATORY_CARE_PROVIDER_SITE_OTHER): Payer: BC Managed Care – PPO | Admitting: Internal Medicine

## 2013-11-18 VITALS — BP 142/77 | HR 66 | Temp 98.5°F | Ht 72.25 in | Wt 210.8 lb

## 2013-11-18 DIAGNOSIS — IMO0001 Reserved for inherently not codable concepts without codable children: Secondary | ICD-10-CM

## 2013-11-18 DIAGNOSIS — J209 Acute bronchitis, unspecified: Secondary | ICD-10-CM

## 2013-11-18 DIAGNOSIS — E785 Hyperlipidemia, unspecified: Secondary | ICD-10-CM

## 2013-11-18 MED ORDER — HYDROCODONE-HOMATROPINE 5-1.5 MG/5ML PO SYRP: 5.0000 mL | ORAL_SOLUTION | Freq: Four times a day (QID) | ORAL | Status: DC | PRN

## 2013-11-18 MED ORDER — AZITHROMYCIN 250 MG PO TABS: ORAL_TABLET | ORAL | Status: DC

## 2013-11-18 NOTE — Progress Notes (Signed)
   Subjective:    Patient ID: Alex Bryant, male    DOB: 04/29/1951, 62 y.o.   MRN: [id number]  HPI   Symptoms began 11/15/13 as chills and diffuse myalgias without associated arthralgias. As of 12/1 he developed cough productive of clear to green sputum. He does continue to have some chills.  Over-the-counter cold and flu preparations have been of some benefit. He did not take the flu shot  His diabetic control is improved dramatically 20 pound weight loss on low carb diet. His fasting blood sugars are averaging 120 and the highest glucose 2 hours after meal less than 160.    Review of Systems  He specifically denies extrinsic symptoms of itchy, watery eyes or sneezing  He also denies sore throat , frontal sinus pain, maxillary sinus pain, nasal purulence, or dental pain.  The cough is not associated with shortness of breath or wheezing.  The chills and not associated with fever or sweats.  He also has no otic pain or otic discharge.     Objective:   Physical Exam  General appearance:good health ;well nourished; no acute distress or increased work of breathing is present.  No  lymphadenopathy about the head, neck, or axilla noted.   Eyes: No conjunctival inflammation or lid edema is present.   Ears:  External ear exam shows no significant lesions or deformities.  Otoscopic examination reveals clear canals, tympanic membranes are intact bilaterally without bulging, retraction, inflammation or discharge.  Nose:  External nasal examination shows no deformity or inflammation. Nasal mucosa are pink and moist without lesions or exudates. No septal dislocation or deviation.No obstruction to airflow.   Oral exam: Dental hygiene is good; lips and gums are healthy appearing.There is no oropharyngeal erythema or exudate noted.   Neck:  No deformities,  masses, or tenderness noted.      Heart:  Normal rate and regular rhythm. S1 and S2 normal without gallop, murmur, click, rub or other  extra sounds.   Lungs:Chest clear to auscultation; no wheezes, rhonchi,rales ,or rubs present.No increased work of breathing.    Extremities:  No cyanosis, edema, or clubbing  noted    Skin: Warm & dry          Assessment & Plan:  #1 acute bronchitis w/o bronchospasm #2 no URI #3DM control improved Plan: See orders and recommendations

## 2013-11-18 NOTE — Progress Notes (Signed)
Pre visit review using our clinic review tool, if applicable. No additional management support is needed unless otherwise documented below in the visit note. 

## 2013-11-18 NOTE — Patient Instructions (Signed)
Order for labs entered into  the computer; these will be performed at [address] [facility]. across from [facility] 02/03/14. No appointment is necessary.

## 2013-12-05 ENCOUNTER — Other Ambulatory Visit: Payer: Self-pay | Admitting: Internal Medicine

## 2013-12-07 NOTE — Telephone Encounter (Signed)
Rx sent to the pharmacy by e-script.//AB/CMA 

## 2014-02-17 ENCOUNTER — Ambulatory Visit (INDEPENDENT_AMBULATORY_CARE_PROVIDER_SITE_OTHER): Payer: BC Managed Care – PPO | Admitting: Emergency Medicine

## 2014-02-17 ENCOUNTER — Encounter: Payer: Self-pay | Admitting: Emergency Medicine

## 2014-02-17 VITALS — BP 145/85 | Ht 72.0 in | Wt 210.0 lb

## 2014-02-17 DIAGNOSIS — M722 Plantar fascial fibromatosis: Secondary | ICD-10-CM

## 2014-02-17 NOTE — Progress Notes (Signed)
Patient ID: Alex Bryant, male   DOB: 1951-12-12, 63 y.o.   MRN: [id number] 63 year old male comes in with a complaint of one to 2 months of right heel pain. He's been wearing at the plantar fascia strap with only moderate relief.  Pain worse in the morning. Improves with prolonged standing. Has been rolling a tennis ball under his foot with only moderate relief. No prior injury.  Symptom onset with increasing activity after a prolonged period of rest/in activity secondary to back surgery.  No radiation of pain down foot.  Social history:  Quit smoking 1982, occasional alcohol  Pertinent past medical history: Hypertension, type 2 diabetes  Review of systems: BP 145/85  Ht 6' (1.829 m)  Wt 210 lb (95.255 kg)  BMI 28.47 kg/m2 Well-developed well-nourished 63 year old white male awake alert oriented no acute distress Right foot: Tenderness to palpation of the medial calcaneal tuberosity. Normal Thompson test No navicular or cuboid tenderness Full range of motion Plantarflexion intact Neurovascularly intact with equal pulses compared to the left foot.

## 2014-02-17 NOTE — Assessment & Plan Note (Signed)
Continue to use the plantar fashion strap. Eccentric stretching/strengthening exercises.  Continue to stretch rolling foot on tennis balls as well as frozen ice bottle. Patient declined night splint. Will followup in 4-6 weeks' time if no improvement we'll consider further intervention.

## 2014-03-16 ENCOUNTER — Other Ambulatory Visit (INDEPENDENT_AMBULATORY_CARE_PROVIDER_SITE_OTHER): Payer: BC Managed Care – PPO

## 2014-03-16 DIAGNOSIS — E785 Hyperlipidemia, unspecified: Secondary | ICD-10-CM

## 2014-03-16 DIAGNOSIS — IMO0001 Reserved for inherently not codable concepts without codable children: Secondary | ICD-10-CM

## 2014-03-16 DIAGNOSIS — E1165 Type 2 diabetes mellitus with hyperglycemia: Principal | ICD-10-CM

## 2014-03-16 LAB — MICROALBUMIN / CREATININE URINE RATIO
Creatinine,U: 181.7 mg/dL
MICROALB UR: 2.1 mg/dL — AB (ref 0.0–1.9)
Microalb Creat Ratio: 1.2 mg/g (ref 0.0–30.0)

## 2014-03-16 LAB — BASIC METABOLIC PANEL
BUN: 17 mg/dL (ref 6–23)
CHLORIDE: 105 meq/L (ref 96–112)
CO2: 27 meq/L (ref 19–32)
Calcium: 9.1 mg/dL (ref 8.4–10.5)
Creatinine, Ser: 1.1 mg/dL (ref 0.4–1.5)
GFR: 70.81 mL/min (ref >60.00)
Glucose, Bld: 138 mg/dL — ABNORMAL HIGH (ref 70–99)
Potassium: 4.4 mEq/L (ref 3.5–5.1)
Sodium: 140 mEq/L (ref 135–145)

## 2014-03-16 LAB — LIPID PANEL
CHOL/HDL RATIO: 4
Cholesterol: 142 mg/dL (ref 0–200)
HDL: 39.3 mg/dL (ref >39.00)
LDL Cholesterol: 74 mg/dL (ref 0–99)
TRIGLYCERIDES: 143 mg/dL (ref 0.0–149.0)
VLDL: 28.6 mg/dL (ref 0.0–40.0)

## 2014-03-16 LAB — HEMOGLOBIN A1C: HEMOGLOBIN A1C: 6.9 % — AB (ref 4.6–6.5)

## 2014-03-16 LAB — AST: AST: 18 U/L (ref 0–37)

## 2014-03-16 LAB — ALT: ALT: 21 U/L (ref 0–53)

## 2014-03-17 ENCOUNTER — Other Ambulatory Visit: Payer: Self-pay

## 2014-03-17 DIAGNOSIS — G4700 Insomnia, unspecified: Secondary | ICD-10-CM

## 2014-03-17 MED ORDER — CLONAZEPAM 0.5 MG PO TABS: ORAL_TABLET | ORAL | Status: DC

## 2014-03-17 NOTE — Telephone Encounter (Signed)
OK with 2 refills

## 2014-03-24 ENCOUNTER — Ambulatory Visit (INDEPENDENT_AMBULATORY_CARE_PROVIDER_SITE_OTHER): Payer: BC Managed Care – PPO | Admitting: Internal Medicine

## 2014-03-24 ENCOUNTER — Encounter: Payer: Self-pay | Admitting: Internal Medicine

## 2014-03-24 VITALS — BP 130/80 | HR 69 | Temp 98.0°F | Resp 14 | Ht 71.75 in | Wt 221.6 lb

## 2014-03-24 DIAGNOSIS — E785 Hyperlipidemia, unspecified: Secondary | ICD-10-CM

## 2014-03-24 DIAGNOSIS — N5089 Other specified disorders of the male genital organs: Secondary | ICD-10-CM

## 2014-03-24 DIAGNOSIS — Z0000 Encounter for general adult medical examination without abnormal findings: Secondary | ICD-10-CM

## 2014-03-24 DIAGNOSIS — N508 Other specified disorders of male genital organs: Secondary | ICD-10-CM

## 2014-03-24 DIAGNOSIS — E119 Type 2 diabetes mellitus without complications: Secondary | ICD-10-CM

## 2014-03-24 MED ORDER — SIMVASTATIN 20 MG PO TABS: ORAL_TABLET | ORAL | Status: DC

## 2014-03-24 NOTE — Progress Notes (Signed)
   Subjective:    Patient ID: Alex Bryant, male    DOB: March 27, 1951, 63 y.o.   MRN: [id number]  HPI  He is here for a physical;acute issues denied.  HYPERTENSION: Disease Monitoring: Blood pressure range :124/70- 148/82 Medication Compliance:yes  Diabetes :  FBS range/average: 101-150 Highest 2 hr post meal glucose:< 170 Medication compliance:yes Hypoglycemia:no Ophthamology care: UTD Podiatry care:no  HYPERLIPIDEMIA: Disease Monitoring: Medication Compliance:yes      Review of Systems  Chest pain, palpitations:no       Dyspnea:no Edema:no Claudication: no Lightheadedness,Syncope:no Weight gain/loss:stale Polyuria/phagia/dipsia:no      Blurred vision /diplopia/lossof vision:no Limb numbness/tingling/burning:no Non healing skin lesions:no Abd pain, bowel changes: no  Myalgias: no Memory loss:no       Objective:   Physical Exam Gen.: Healthy and well-nourished in appearance. Alert, appropriate and cooperative throughout exam. Appears younger than stated age  Head: Normocephalic without obvious abnormalities; noalopecia  Eyes: No corneal or conjunctival inflammation noted. Pupils equal round reactive to light and accommodation. Extraocular motion intact. Fundal exam is benign without hemorrhages, exudate, papilledema.  Vision grossly normal with /w/o lenses Ears: External  ear exam reveals no significant lesions or deformities. Canals clear .TMs normal. Hearing is grossly normal bilaterally. Nose: External nasal exam reveals no deformity or inflammation. Nasal mucosa are pink and moist. No lesions or exudates noted.   Mouth: Oral mucosa and oropharynx reveal no lesions or exudates. Teeth in good repair. Neck: No deformities, masses, or tenderness noted. Range of motion & Thyroid normal Lungs: Normal respiratory effort; chest expands symmetrically. Lungs are clear to auscultation without rales, wheezes, or increased work of breathing. Heart: Normal rate and  rhythm. Normal S1 and S2. No gallop, click, or rub. No murmur. Abdomen: Bowel sounds normal; abdomen soft and nontender. No masses, organomegaly or hernias noted. Genitalia: Genitalia normal except for left varices & bilateral granuloma , R > L.. Prostate is normal without enlargement, asymmetry, nodularity, or induration                                Musculoskeletal/extremities: No deformity or scoliosis noted of  the thoracic or lumbar spine.   No clubbing, cyanosis, edema, or significant extremity  deformity noted. Range of motion normal .Tone & strength normal. Hand joints normal Fingernail  health good. Able to lie down & sit up w/o help. Negative SLR bilaterally Vascular: Carotid, radial artery, dorsalis pedis and  posterior tibial pulses are full and equal. No bruits present. Neurologic: Alert and oriented x3. Deep tendon reflexes symmetrical and normal.  Gait normal . Skin: Intact without suspicious lesions or rashes. Lymph: No cervical, axillary, or inguinal lymphadenopathy present. Psych: Mood and affect are normal. Normally interactive                                                                                       Assessment & Plan:  #1 comprehensive physical exam; no acute findings #2 epididymal granuloma  Plan: see Orders  & Recommendations

## 2014-03-24 NOTE — Patient Instructions (Addendum)
Your next office appointment will be determined based upon review of your pending imaging. Those instructions will be transmitted to you through My Chart . Please schedule A1c & urine microalbumin in 4-6 months. Schedule colonoscopy this year.

## 2014-03-24 NOTE — Progress Notes (Signed)
Pre visit review using our clinic review tool, if applicable. No additional management support is needed unless otherwise documented below in the visit note. 

## 2014-03-30 ENCOUNTER — Ambulatory Visit
Admission: RE | Admit: 2014-03-30 | Discharge: 2014-03-30 | Disposition: A | Discharge status: Home / Self Care | Payer: BC Managed Care – PPO | Source: Ambulatory Visit | Attending: Internal Medicine | Admitting: Internal Medicine

## 2014-03-30 DIAGNOSIS — N5089 Other specified disorders of the male genital organs: Secondary | ICD-10-CM

## 2014-04-06 ENCOUNTER — Telehealth: Payer: Self-pay

## 2014-04-06 NOTE — Telephone Encounter (Signed)
Relevant patient education assigned to patient using Emmi. ° °

## 2014-04-07 ENCOUNTER — Encounter: Payer: Self-pay | Admitting: Internal Medicine

## 2014-06-17 ENCOUNTER — Other Ambulatory Visit: Payer: Self-pay | Admitting: Internal Medicine

## 2014-06-17 NOTE — Telephone Encounter (Signed)
OK X1 

## 2014-07-12 ENCOUNTER — Other Ambulatory Visit: Payer: Self-pay | Admitting: Internal Medicine

## 2014-07-20 ENCOUNTER — Other Ambulatory Visit: Payer: Self-pay | Admitting: Internal Medicine

## 2014-07-20 NOTE — Telephone Encounter (Signed)
03/24/14 office visit 06/17/14 last filled

## 2014-07-20 NOTE — Telephone Encounter (Signed)
OK x 1 

## 2014-07-21 ENCOUNTER — Other Ambulatory Visit: Payer: Self-pay | Admitting: Internal Medicine

## 2014-08-17 ENCOUNTER — Other Ambulatory Visit: Payer: Self-pay | Admitting: Internal Medicine

## 2014-08-17 NOTE — Telephone Encounter (Signed)
03/24/14 last office visit  07/20/14 phoned to pharmacy

## 2014-08-17 NOTE — Telephone Encounter (Signed)
OK x 1 

## 2014-08-25 ENCOUNTER — Encounter: Payer: Self-pay | Admitting: Internal Medicine

## 2014-09-09 ENCOUNTER — Ambulatory Visit (AMBULATORY_SURGERY_CENTER): Payer: Self-pay | Admitting: *Deleted

## 2014-09-09 VITALS — Ht 72.0 in | Wt 224.2 lb

## 2014-09-09 DIAGNOSIS — Z86010 Personal history of colonic polyps: Secondary | ICD-10-CM

## 2014-09-09 MED ORDER — MOVIPREP 100 G PO SOLR: 1.0000 | Freq: Once | ORAL | Status: DC

## 2014-09-09 NOTE — Progress Notes (Signed)
No egg or soy allergy. ewm No home 02 use. ewm No problems with past sedation. ewm No diet pills. ewm Pt declined emmi video. ewm

## 2014-09-16 ENCOUNTER — Other Ambulatory Visit: Payer: Self-pay | Admitting: Internal Medicine

## 2014-09-16 NOTE — Telephone Encounter (Signed)
Med last phoned to pharmacy on 08/17/14 Last office visit 03/24/14

## 2014-09-16 NOTE — Telephone Encounter (Signed)
Clonazepam called to pharmacy  

## 2014-09-16 NOTE — Telephone Encounter (Signed)
OK X1 

## 2014-09-20 ENCOUNTER — Encounter: Payer: Self-pay | Admitting: Internal Medicine

## 2014-09-29 ENCOUNTER — Encounter: Payer: BC Managed Care – PPO | Admitting: Internal Medicine

## 2014-10-15 ENCOUNTER — Other Ambulatory Visit: Payer: Self-pay | Admitting: Internal Medicine

## 2014-10-15 NOTE — Telephone Encounter (Signed)
Clonazepam has been called to [facility]

## 2014-10-15 NOTE — Telephone Encounter (Signed)
Med last phoned to pharmacy 10.1.15 #30 Last office visit was on 4.8.15

## 2014-10-15 NOTE — Telephone Encounter (Signed)
OK X1 

## 2014-10-20 ENCOUNTER — Encounter: Payer: Self-pay | Admitting: Internal Medicine

## 2014-10-20 ENCOUNTER — Ambulatory Visit (AMBULATORY_SURGERY_CENTER): Payer: BC Managed Care – PPO | Admitting: Internal Medicine

## 2014-10-20 VITALS — BP 153/85 | HR 59 | Temp 97.0°F | Resp 20 | Ht 72.0 in | Wt 224.0 lb

## 2014-10-20 DIAGNOSIS — D125 Benign neoplasm of sigmoid colon: Secondary | ICD-10-CM

## 2014-10-20 DIAGNOSIS — Z86010 Personal history of colonic polyps: Secondary | ICD-10-CM

## 2014-10-20 DIAGNOSIS — D122 Benign neoplasm of ascending colon: Secondary | ICD-10-CM

## 2014-10-20 DIAGNOSIS — D120 Benign neoplasm of cecum: Secondary | ICD-10-CM

## 2014-10-20 LAB — GLUCOSE, CAPILLARY
GLUCOSE-CAPILLARY: 113 mg/dL — AB (ref 70–99)
Glucose-Capillary: 114 mg/dL — ABNORMAL HIGH (ref 70–99)

## 2014-10-20 MED ORDER — SODIUM CHLORIDE 0.9 % IV SOLN: 500.0000 mL | INTRAVENOUS | Status: DC

## 2014-10-20 NOTE — Op Note (Signed)
Alex Bryant  Alex Bryant. [place], [id number]   COLONOSCOPY PROCEDURE REPORT  PATIENT: Alex Bryant, Alex Bryant  MR#: [id number] BIRTHDATE: [birth year] , 8  yrs. old GENDER: male ENDOSCOPIST: Lafayette Dragon, MD REFERRED TO:[id number] Linna Darner, M.D. PROCEDURE DATE:  10/20/2014 PROCEDURE:   Colonoscopy with cold biopsy polypectomy and Colonoscopy with snare polypectomy First Screening Colonoscopy - Avg.  risk and is 50 yrs.  old or older - No.  Prior Negative Screening - Now for repeat screening. N/A  History of Adenoma - Now for follow-up colonoscopy & has been > or = to 3 yrs.  Yes hx of adenoma.  Has been 3 or more years since last colonoscopy.  Polyps Removed Today? Yes. ASA CLASS:   Class II INDICATIONS:tubular adenoma removed on the last colonoscopy in July 2010.  Prior colonoscopy in 2004, hyperplastic polyp. MEDICATIONS: Monitored anesthesia care and Propofol 350 mg IV  DESCRIPTION OF PROCEDURE:   After the risks benefits and alternatives of the procedure were thoroughly explained, informed consent was obtained.  The digital rectal exam revealed no abnormalities of the rectum.   The LB [id number]-H190 [id number]  endoscope was introduced through the anus and advanced to the cecum, which was identified by both the appendix and ileocecal valve. No adverse events experienced. There were  first grade internal hemorrhoids The quality of the prep was good, using MoviPrep  The instrument was then slowly withdrawn as the colon was fully examined.      COLON FINDINGS: Three polypoid shaped sessile polyps measuring 3 - 10 mm mm in size were found at the cecum, ( 10 mm), in the ascending colon,( 5 mm) and sigmoid colon (3 mm).  A polypectomy was performed with a cold snare( cecmu ).and cold biopsy   in the ascending and sigmoid colon.  The resection was complete, the polyp tissue was completely retrieved and sent to histology.   There was moderate diverticulosis noted in the descending  colon and sigmoid colon with associated muscular hypertrophy.  Retroflexed views revealed no abnormalities. The time to cecum=5 minutes 59 seconds. Withdrawal time=13 minutes 41 seconds.  The scope was withdrawn and the procedure completed. COMPLICATIONS: There were no immediate complications.  ENDOSCOPIC IMPRESSION: 1.   Three sessile polyps were found at the cecum, in the ascending colon, and sigmoid colon; polypectomy was performed with a cold snare  x1 and cold biopsies x2 2.   There was moderate diverticulosis noted in the descending colon and sigmoid colon 3. first grade internal hemorrhoids  RECOMMENDATIONS: 1.  Await pathology results 2.  No aspirin for 14 days 3,High fiber diet 4.Recall colonoscopy pending path report  eSigned:  Lafayette Dragon, MD 10/20/2014 3:43 PM   cc:   PATIENT NAME:  Alex Bryant, Alex Bryant MR#: [id number]

## 2014-10-20 NOTE — Progress Notes (Signed)
Called to room to assist during endoscopic procedure.  Patient ID and intended procedure confirmed with present staff. Received instructions for my participation in the procedure from the performing physician.  

## 2014-10-20 NOTE — Progress Notes (Signed)
Patient awakening,vss,report to rn 

## 2014-10-20 NOTE — Patient Instructions (Signed)
YOU HAD AN ENDOSCOPIC PROCEDURE TODAY AT THE [facility]: Refer to the procedure report that was given to you for any specific questions about what was found during the examination.  If the procedure report does not answer your questions, please call your gastroenterologist to clarify.  If you requested that your care partner not be given the details of your procedure findings, then the procedure report has been included in a sealed envelope for you to review at your convenience later.  YOU SHOULD EXPECT: Some feelings of bloating in the abdomen. Passage of more gas than usual.  Walking can help get rid of the air that was put into your GI tract during the procedure and reduce the bloating. If you had a lower endoscopy (such as a colonoscopy or flexible sigmoidoscopy) you may notice spotting of blood in your stool or on the toilet paper. If you underwent a bowel prep for your procedure, then you may not have a normal bowel movement for a few days.  DIET: Your first meal following the procedure should be a light meal and then it is ok to progress to your normal diet.  A half-sandwich or bowl of soup is an example of a good first meal.  Heavy or fried foods are harder to digest and may make you feel nauseous or bloated.  Likewise meals heavy in dairy and vegetables can cause extra gas to form and this can also increase the bloating.  Drink plenty of fluids but you should avoid alcoholic beverages for 24 hours.  ACTIVITY: Your care partner should take you home directly after the procedure.  You should plan to take it easy, moving slowly for the rest of the day.  You can resume normal activity the day after the procedure however you should NOT DRIVE or use heavy machinery for 24 hours (because of the sedation medicines used during the test).    SYMPTOMS TO REPORT IMMEDIATELY: A gastroenterologist can be reached at any hour.  During normal business hours, 8:30 AM to 5:00 PM Monday through Friday,  call [phone].  After hours and on weekends, please call the GI answering service at [phone] who will take a message and have the physician on call contact you.   Following lower endoscopy (colonoscopy or flexible sigmoidoscopy):  Excessive amounts of blood in the stool  Significant tenderness or worsening of abdominal pains  Swelling of the abdomen that is new, acute  Fever of 100F or higher  FOLLOW UP: If any biopsies were taken you will be contacted by phone or by letter within the next 1-3 weeks.  Call your gastroenterologist if you have not heard about the biopsies in 3 weeks.  Our staff will call the home number listed on your records the next business day following your procedure to check on you and address any questions or concerns that you may have at that time regarding the information given to you following your procedure. This is a courtesy call and so if there is no answer at the home number and we have not heard from you through the emergency physician on call, we will assume that you have returned to your regular daily activities without incident.  SIGNATURES/CONFIDENTIALITY: You and/or your care partner have signed paperwork which will be entered into your electronic medical record.  These signatures attest to the fact that that the information above on your After Visit Summary has been reviewed and is understood.  Full responsibility of the confidentiality of this   discharge information lies with you and/or your care-partner.   Resume medications,but no aspirin for 14 days, Information given on polyps, diverticulosis, hemorrhoids and high fiber diet with discharge instructions.

## 2014-10-21 ENCOUNTER — Telehealth: Payer: Self-pay

## 2014-10-21 NOTE — Telephone Encounter (Signed)
  Follow up Call-  Call back number 10/20/2014  Post procedure Call Back phone  # [phone]  Permission to leave phone message Yes     Patient questions:  Do you have a fever, pain , or abdominal swelling? No. Pain Score  0 *  Have you tolerated food without any problems? Yes.    Have you been able to return to your normal activities? Yes.    Do you have any questions about your discharge instructions: Diet   No. Medications  No. Follow up visit  No.  Do you have questions or concerns about your Care? No.  Actions: * If pain score is 4 or above: No action needed, pain <4.

## 2014-10-27 ENCOUNTER — Encounter: Payer: Self-pay | Admitting: Internal Medicine

## 2014-11-16 ENCOUNTER — Other Ambulatory Visit: Payer: Self-pay | Admitting: Internal Medicine

## 2014-11-16 NOTE — Telephone Encounter (Signed)
OK X1 

## 2014-11-16 NOTE — Telephone Encounter (Signed)
Clonazepam has been called to [facility]  

## 2014-11-18 LAB — HM DIABETES EYE EXAM

## 2014-12-02 ENCOUNTER — Encounter: Payer: Self-pay | Admitting: Internal Medicine

## 2014-12-16 ENCOUNTER — Other Ambulatory Visit: Payer: Self-pay | Admitting: Internal Medicine

## 2014-12-16 NOTE — Telephone Encounter (Signed)
Clonazepam has been called to [facility] on [address]

## 2014-12-16 NOTE — Telephone Encounter (Signed)
OK X1 

## 2015-01-14 ENCOUNTER — Other Ambulatory Visit: Payer: Self-pay | Admitting: Internal Medicine

## 2015-01-17 NOTE — Telephone Encounter (Signed)
Clonazepam #[phone] has been called to [facility] on [address]

## 2015-01-17 NOTE — Telephone Encounter (Signed)
OK #30 

## 2015-02-15 ENCOUNTER — Other Ambulatory Visit: Payer: Self-pay | Admitting: Internal Medicine

## 2015-02-15 NOTE — Telephone Encounter (Signed)
OK X 1 but he needs CPX in 4/16

## 2015-02-15 NOTE — Telephone Encounter (Signed)
Clonazepam has been called to [facility]

## 2015-03-21 ENCOUNTER — Other Ambulatory Visit: Payer: Self-pay | Admitting: Internal Medicine

## 2015-03-21 NOTE — Telephone Encounter (Signed)
Clonazepam has been called to [facility] on [address] [place] [address]

## 2015-03-21 NOTE — Telephone Encounter (Signed)
OK X1 Needs OV ;last seen 03/24/14

## 2015-04-18 ENCOUNTER — Other Ambulatory Visit: Payer: Self-pay | Admitting: Internal Medicine

## 2015-04-19 ENCOUNTER — Other Ambulatory Visit: Payer: Self-pay | Admitting: Internal Medicine

## 2015-04-19 DIAGNOSIS — E782 Mixed hyperlipidemia: Secondary | ICD-10-CM

## 2015-04-19 DIAGNOSIS — D126 Benign neoplasm of colon, unspecified: Secondary | ICD-10-CM

## 2015-04-19 DIAGNOSIS — IMO0002 Reserved for concepts with insufficient information to code with codable children: Secondary | ICD-10-CM

## 2015-04-19 DIAGNOSIS — E1165 Type 2 diabetes mellitus with hyperglycemia: Secondary | ICD-10-CM

## 2015-04-19 DIAGNOSIS — I10 Essential (primary) hypertension: Secondary | ICD-10-CM

## 2015-04-19 NOTE — Telephone Encounter (Signed)
Patient needs an office visit before refilling....correct?

## 2015-04-19 NOTE — Telephone Encounter (Signed)
OK X1 Orders entered for fasting labs to be done by end of May ; office visit 2 -3 days later

## 2015-04-19 NOTE — Telephone Encounter (Signed)
Script has been faxed to [facility].

## 2015-05-03 ENCOUNTER — Encounter: Payer: Self-pay | Admitting: Internal Medicine

## 2015-05-23 ENCOUNTER — Other Ambulatory Visit: Payer: Self-pay | Admitting: Internal Medicine

## 2015-05-23 NOTE — Telephone Encounter (Signed)
Patient needs ov before refill, per Dr. Linna Darner. Would you please schedule this for me? Thanks.

## 2015-05-23 NOTE — Telephone Encounter (Signed)
Last seen 4/15 Needs OV before refill

## 2015-05-25 ENCOUNTER — Ambulatory Visit (INDEPENDENT_AMBULATORY_CARE_PROVIDER_SITE_OTHER): Payer: Medicare Other | Admitting: Internal Medicine

## 2015-05-25 ENCOUNTER — Other Ambulatory Visit (INDEPENDENT_AMBULATORY_CARE_PROVIDER_SITE_OTHER): Payer: Medicare Other

## 2015-05-25 ENCOUNTER — Encounter: Payer: Self-pay | Admitting: Internal Medicine

## 2015-05-25 ENCOUNTER — Other Ambulatory Visit: Payer: Self-pay | Admitting: Internal Medicine

## 2015-05-25 VITALS — BP 138/80 | HR 64 | Temp 97.8°F | Resp 15 | Wt 225.0 lb

## 2015-05-25 DIAGNOSIS — E1165 Type 2 diabetes mellitus with hyperglycemia: Secondary | ICD-10-CM | POA: Diagnosis not present

## 2015-05-25 DIAGNOSIS — IMO0002 Reserved for concepts with insufficient information to code with codable children: Secondary | ICD-10-CM

## 2015-05-25 DIAGNOSIS — Z8639 Personal history of other endocrine, nutritional and metabolic disease: Secondary | ICD-10-CM

## 2015-05-25 DIAGNOSIS — E782 Mixed hyperlipidemia: Secondary | ICD-10-CM | POA: Diagnosis not present

## 2015-05-25 DIAGNOSIS — Z8739 Personal history of other diseases of the musculoskeletal system and connective tissue: Secondary | ICD-10-CM

## 2015-05-25 DIAGNOSIS — D126 Benign neoplasm of colon, unspecified: Secondary | ICD-10-CM

## 2015-05-25 DIAGNOSIS — G479 Sleep disorder, unspecified: Secondary | ICD-10-CM

## 2015-05-25 DIAGNOSIS — I10 Essential (primary) hypertension: Secondary | ICD-10-CM | POA: Diagnosis not present

## 2015-05-25 LAB — HEPATIC FUNCTION PANEL
ALT: 21 U/L (ref 0–53)
AST: 17 U/L (ref 0–37)
Albumin: 4.2 g/dL (ref 3.5–5.2)
Alkaline Phosphatase: 76 U/L (ref 39–117)
BILIRUBIN DIRECT: 0.1 mg/dL (ref 0.0–0.3)
BILIRUBIN TOTAL: 0.7 mg/dL (ref 0.2–1.2)
Total Protein: 6.9 g/dL (ref 6.0–8.3)

## 2015-05-25 LAB — HEMOGLOBIN A1C: Hgb A1c MFr Bld: 7.3 % — ABNORMAL HIGH (ref 4.6–6.5)

## 2015-05-25 LAB — BASIC METABOLIC PANEL
BUN: 18 mg/dL (ref 6–23)
CO2: 27 mEq/L (ref 19–32)
Calcium: 9.3 mg/dL (ref 8.4–10.5)
Chloride: 105 mEq/L (ref 96–112)
Creatinine, Ser: 1.03 mg/dL (ref 0.40–1.50)
GFR: 76.91 mL/min (ref >60.00)
GLUCOSE: 171 mg/dL — AB (ref 70–99)
Potassium: 4.8 mEq/L (ref 3.5–5.1)
Sodium: 137 mEq/L (ref 135–145)

## 2015-05-25 LAB — MICROALBUMIN / CREATININE URINE RATIO
Creatinine,U: 152.2 mg/dL
Microalb Creat Ratio: 0.8 mg/g (ref 0.0–30.0)
Microalb, Ur: 1.2 mg/dL (ref 0.0–1.9)

## 2015-05-25 LAB — TSH: TSH: 1.09 u[IU]/mL (ref 0.35–4.50)

## 2015-05-25 LAB — URIC ACID: URIC ACID, SERUM: 8.7 mg/dL — AB (ref 4.0–7.8)

## 2015-05-25 MED ORDER — CLONAZEPAM 0.5 MG PO TABS: ORAL_TABLET | ORAL | Status: DC

## 2015-05-25 NOTE — Assessment & Plan Note (Signed)
Uric acid

## 2015-05-25 NOTE — Assessment & Plan Note (Signed)
Blood pressure goals reviewed. BMET 

## 2015-05-25 NOTE — Progress Notes (Signed)
Pre visit review using our clinic review tool, if applicable. No additional management support is needed unless otherwise documented below in the visit note. 

## 2015-05-25 NOTE — Assessment & Plan Note (Signed)
Lipids, LFTs, TSH  

## 2015-05-25 NOTE — Patient Instructions (Signed)
  Your next office appointment will be determined based upon review of your pending labs . Those written interpretation of the lab results and instructions will be transmitted to you by My Chart Critical results will be called.  Followup as needed for any active or acute issue. Please report any significant change in your symptoms.  Minimal Blood Pressure Goal= AVERAGE < 140/90;  Ideal is an AVERAGE < 135/85. This AVERAGE should be calculated from @ least 5-7 BP readings taken @ different times of day on different days of week. You should not respond to isolated BP readings , but rather the AVERAGE for that week .Please bring your  blood pressure cuff to office visits to verify that it is reliable.It  can also be checked against the blood pressure device at the pharmacy. Finger or wrist cuffs are not dependable; an arm cuff is.  To prevent sleep dysfunction follow these instructions for sleep hygiene. Do not read, watch TV, or eat in bed. Do not get into bed until you are ready to turn off the light &  to go to sleep. Do not ingest stimulants ( decongestants, diet pills, nicotine, caffeine) after the evening meal.Do not take daytime naps.Cardiovascular exercise, this can be as simple a program as walking, is recommended 30-45 minutes 3-4 times per week. If you're not exercising you should take 6-8 weeks to build up to this level.

## 2015-05-25 NOTE — Assessment & Plan Note (Signed)
Renew Clonazepam

## 2015-05-25 NOTE — Assessment & Plan Note (Signed)
A1c , urine microalbumin, BMET 

## 2015-05-25 NOTE — Progress Notes (Signed)
   Subjective:    Patient ID: Alex Bryant, male    DOB: 1951/08/18, 63 y.o.   MRN: [id number]  HPI  He is here to assess status of active health conditions:  No current labs since 10/20/14; orders placed 03/24/14 and 04/19/15 but not completed.  Diet/ nutrition: He limits fried foods; he has lean red meat 2 times a week. He does eat fish often. Exercise program: He averages 10,000 steps per day without cardio pulmonary symptoms  HYPERTENSION: Disease Monitoring: Blood pressure averages 130 over the 80s Medication Compliance: Yes, without adverse effects  FASTING HYPERGLYCEMIA OR Diabetes :  FBS range/average: No glucose monitor Highest 2 hr post meal glucose: Not monitored Medication compliance: Only on metformin once daily Hypoglycemia: No  Ophthamology care: No retinopathy 4/16 Podiatry care: None  HYPERLIPIDEMIA: Disease Monitoring: Overdue for lipid assessment Medication Compliance: Yes, without adverse effects.  Clonazepam 0.5 g at bedtime controls his sleep dysfunction allowing him to sleep from 10 PM-4 AM. Apparently he snores when supine but no apneas reported as per his wife.  Colonoscopy was completed 11/15 and revealed a tubular adenoma, hyperplastic polyp, and a sessile serrated polyp. Follow-up would be in 2020.  No family history of premature coronary artery disease. Mother had stroke in 2 occasions.   Review of Systems  Chest pain, palpitations: No       Dyspnea: No, unless walking uphill Edema: No Claudication: No Lightheadedness,Syncope: No Weight gain/loss: Weight stable Polyuria/phagia/dipsia:  No    Blurred vision /diplopia/lossof vision: No Limb numbness/tingling/burning: Only numbness in the right foot following his L5-S1 back surgery. Non healing skin lesions: No Abd pain, bowel changes: No  Myalgias: No Memory loss: No      Objective:   Physical Exam  General appearance :adequately nourished; in no distress.  Eyes: Bilateral ptosis  present. No conjunctival inflammation or scleral icterus is present.  Oral exam:  Lips and gums are healthy appearing.There is no oropharyngeal erythema or exudate noted. Dental hygiene is good.  Heart:  Slow rate and regular rhythm. S1 and S2 normal without gallop, murmur, click, rub or other extra sounds    Lungs:Chest clear to auscultation; no wheezes, rhonchi,rales ,or rubs present.No increased work of breathing.   Abdomen: bowel sounds normal, soft and non-tender without masses, organomegaly or hernias noted.  No guarding or rebound.   Vascular : all pulses equal ; no bruits present.  Skin:Warm & dry.  Intact without suspicious lesions or rashes ; no tenting or jaundice   Lymphatic: No lymphadenopathy is noted about the head, neck, axilla   Neuro: Strength, tone & DTRs normal.        Assessment & Plan:  See Current Assessment & Plan in Problem List under specific Diagnosis

## 2015-05-27 ENCOUNTER — Other Ambulatory Visit: Payer: Self-pay | Admitting: Emergency Medicine

## 2015-05-27 ENCOUNTER — Other Ambulatory Visit: Payer: Self-pay | Admitting: Internal Medicine

## 2015-05-27 ENCOUNTER — Telehealth: Payer: Self-pay | Admitting: Emergency Medicine

## 2015-05-27 DIAGNOSIS — IMO0002 Reserved for concepts with insufficient information to code with codable children: Secondary | ICD-10-CM

## 2015-05-27 DIAGNOSIS — E1165 Type 2 diabetes mellitus with hyperglycemia: Secondary | ICD-10-CM

## 2015-05-27 LAB — NMR LIPOPROFILE WITH LIPIDS
Cholesterol, Total: 153 mg/dL (ref 100–199)
HDL Particle Number: 28.5 umol/L — ABNORMAL LOW (ref >=30.5)
HDL Size: 8.4 nm — ABNORMAL LOW (ref >=9.2)
HDL-C: 38 mg/dL — ABNORMAL LOW (ref >39)
LDL (calc): 77 mg/dL (ref 0–99)
LDL Particle Number: 1321 nmol/L — ABNORMAL HIGH (ref <1000)
LDL SIZE: 20.2 nm (ref >=20.8)
LP-IR Score: 67 — ABNORMAL HIGH (ref <=45)
Large HDL-P: <1.3 umol/L — ABNORMAL LOW (ref >=4.8)
Large VLDL-P: 2.6 nmol/L (ref <=2.7)
Small LDL Particle Number: 769 nmol/L — ABNORMAL HIGH (ref <=527)
Triglycerides: 188 mg/dL — ABNORMAL HIGH (ref 0–149)
VLDL SIZE: 45.3 nm (ref <=46.6)

## 2015-05-27 MED ORDER — CLONAZEPAM 0.5 MG PO TABS: ORAL_TABLET | ORAL | Status: DC

## 2015-05-27 MED ORDER — GLIMEPIRIDE 2 MG PO TABS: 2.0000 mg | ORAL_TABLET | Freq: Every day | ORAL | Status: DC

## 2015-05-27 NOTE — Telephone Encounter (Signed)
Amaryl and Klonopin sent to [id number].

## 2015-07-18 ENCOUNTER — Other Ambulatory Visit: Payer: Self-pay | Admitting: Internal Medicine

## 2015-07-25 ENCOUNTER — Other Ambulatory Visit: Payer: Self-pay | Admitting: Internal Medicine

## 2015-08-08 ENCOUNTER — Other Ambulatory Visit: Payer: Self-pay | Admitting: Internal Medicine

## 2015-11-22 ENCOUNTER — Other Ambulatory Visit: Payer: Self-pay | Admitting: Family

## 2015-11-22 NOTE — Telephone Encounter (Signed)
Faxed script back to [facility].../lmb 

## 2015-11-22 NOTE — Telephone Encounter (Signed)
MD out of office pls advise on refill.../lmb 

## 2015-12-22 ENCOUNTER — Other Ambulatory Visit: Payer: Self-pay | Admitting: Family

## 2015-12-23 NOTE — Telephone Encounter (Signed)
Please advise, thanks.

## 2015-12-30 ENCOUNTER — Ambulatory Visit (INDEPENDENT_AMBULATORY_CARE_PROVIDER_SITE_OTHER): Payer: PPO | Admitting: Internal Medicine

## 2015-12-30 ENCOUNTER — Encounter: Payer: Self-pay | Admitting: Internal Medicine

## 2015-12-30 ENCOUNTER — Other Ambulatory Visit (INDEPENDENT_AMBULATORY_CARE_PROVIDER_SITE_OTHER): Payer: PPO

## 2015-12-30 VITALS — BP 116/64 | HR 61 | Temp 97.9°F | Resp 16 | Ht 72.0 in | Wt 224.0 lb

## 2015-12-30 DIAGNOSIS — E782 Mixed hyperlipidemia: Secondary | ICD-10-CM

## 2015-12-30 DIAGNOSIS — IMO0001 Reserved for inherently not codable concepts without codable children: Secondary | ICD-10-CM

## 2015-12-30 DIAGNOSIS — I10 Essential (primary) hypertension: Secondary | ICD-10-CM

## 2015-12-30 DIAGNOSIS — E1165 Type 2 diabetes mellitus with hyperglycemia: Secondary | ICD-10-CM

## 2015-12-30 DIAGNOSIS — Z0000 Encounter for general adult medical examination without abnormal findings: Secondary | ICD-10-CM | POA: Diagnosis not present

## 2015-12-30 DIAGNOSIS — G479 Sleep disorder, unspecified: Secondary | ICD-10-CM

## 2015-12-30 DIAGNOSIS — Z125 Encounter for screening for malignant neoplasm of prostate: Secondary | ICD-10-CM | POA: Diagnosis not present

## 2015-12-30 LAB — CBC WITH DIFFERENTIAL/PLATELET
BASOS ABS: 0.1 10*3/uL (ref 0.0–0.1)
Basophils Relative: 1.1 % (ref 0.0–3.0)
Eosinophils Absolute: 0.5 10*3/uL (ref 0.0–0.7)
Eosinophils Relative: 8.4 % — ABNORMAL HIGH (ref 0.0–5.0)
HCT: 44.3 % (ref 39.0–52.0)
Hemoglobin: 15 g/dL (ref 13.0–17.0)
LYMPHS ABS: 1.5 10*3/uL (ref 0.7–4.0)
LYMPHS PCT: 24.9 % (ref 12.0–46.0)
MCHC: 33.9 g/dL (ref 30.0–36.0)
MCV: 90.3 fl (ref 78.0–100.0)
MONOS PCT: 8 % (ref 3.0–12.0)
Monocytes Absolute: 0.5 10*3/uL (ref 0.1–1.0)
NEUTROS PCT: 57.6 % (ref 43.0–77.0)
Neutro Abs: 3.5 10*3/uL (ref 1.4–7.7)
Platelets: 239 10*3/uL (ref 150.0–400.0)
RBC: 4.9 Mil/uL (ref 4.22–5.81)
RDW: 13.2 % (ref 11.5–15.5)
WBC: 6.1 10*3/uL (ref 4.0–10.5)

## 2015-12-30 LAB — COMPREHENSIVE METABOLIC PANEL
ALK PHOS: 74 U/L (ref 39–117)
ALT: 21 U/L (ref 0–53)
AST: 17 U/L (ref 0–37)
Albumin: 4.2 g/dL (ref 3.5–5.2)
BILIRUBIN TOTAL: 0.6 mg/dL (ref 0.2–1.2)
BUN: 23 mg/dL (ref 6–23)
CO2: 28 mEq/L (ref 19–32)
CREATININE: 1.18 mg/dL (ref 0.40–1.50)
Calcium: 9.9 mg/dL (ref 8.4–10.5)
Chloride: 105 mEq/L (ref 96–112)
GFR: 65.62 mL/min (ref >60.00)
GLUCOSE: 200 mg/dL — AB (ref 70–99)
Potassium: 4.9 mEq/L (ref 3.5–5.1)
Sodium: 139 mEq/L (ref 135–145)
TOTAL PROTEIN: 7.3 g/dL (ref 6.0–8.3)

## 2015-12-30 LAB — LIPID PANEL
CHOL/HDL RATIO: 4
Cholesterol: 150 mg/dL (ref 0–200)
HDL: 35.7 mg/dL — ABNORMAL LOW (ref >39.00)
LDL Cholesterol: 74 mg/dL (ref 0–99)
NONHDL: 113.9
TRIGLYCERIDES: 199 mg/dL — AB (ref 0.0–149.0)
VLDL: 39.8 mg/dL (ref 0.0–40.0)

## 2015-12-30 LAB — TSH: TSH: 1.78 u[IU]/mL (ref 0.35–4.50)

## 2015-12-30 LAB — HEMOGLOBIN A1C: Hgb A1c MFr Bld: 7.7 % — ABNORMAL HIGH (ref 4.6–6.5)

## 2015-12-30 MED ORDER — TRAZODONE HCL 50 MG PO TABS: 50.0000 mg | ORAL_TABLET | Freq: Every evening | ORAL | Status: DC | PRN

## 2015-12-30 MED ORDER — CLONAZEPAM 0.5 MG PO TABS: ORAL_TABLET | ORAL | Status: DC

## 2015-12-30 NOTE — Assessment & Plan Note (Signed)
On simvastatin 20 mg daily Check lipid panel, cmp

## 2015-12-30 NOTE — Assessment & Plan Note (Signed)
Last a1c was 7.3 Discussed goal of less than 7 Continue being very active - may need to add regular exercise Improve diet Check a1c

## 2015-12-30 NOTE — Progress Notes (Signed)
Pre visit review using our clinic review tool, if applicable. No additional management support is needed unless otherwise documented below in the visit note. 

## 2015-12-30 NOTE — Assessment & Plan Note (Signed)
BP Readings from Last 3 Encounters:  12/30/15 116/64  05/25/15 138/80  10/20/14 153/85   Controlled here today Continue low dose ramipril

## 2015-12-30 NOTE — Patient Instructions (Signed)
[name] , Thank you for taking time to come for your Medicare Wellness Visit. I appreciate your ongoing commitment to your health goals. Please review the following plan we discussed and let me know if I can assist you in the future.   These are the goals we discussed: Goals    Continue to work on eating habits      This is a list of the screening recommended for you and due dates:  Health Maintenance  Topic Date Due  .  Hepatitis C: One time screening is recommended by Center for Disease Control  (CDC) for  adults born from [birth year] through 1967.   April 03, 1951  . Tetanus Vaccine  11/20/1968  . Pneumonia vaccines (1 of 2 - PCV13) 11/20/2014  . Eye exam for diabetics  11/19/2015  . Hemoglobin A1C  11/24/2015  . Flu Shot  01/01/2016*  . Complete foot exam   05/24/2016  . Colon Cancer Screening  10/21/2019  . Shingles Vaccine  Completed  *Topic was postponed. The date shown is not the original due date.   Health Maintenance, Male A healthy lifestyle and preventative care can promote health and wellness.  Maintain regular health, dental, and eye exams.  Eat a healthy diet. Foods like vegetables, fruits, whole grains, low-fat dairy products, and lean protein foods contain the nutrients you need and are low in calories. Decrease your intake of foods high in solid fats, added sugars, and salt. Get information about a proper diet from your health care provider, if necessary.  Regular physical exercise is one of the most important things you can do for your health. Most adults should get at least 150 minutes of moderate-intensity exercise (any activity that increases your heart rate and causes you to sweat) each week. In addition, most adults need muscle-strengthening exercises on 2 or more days a week.   Maintain a healthy weight. The body mass index (BMI) is a screening tool to identify possible weight problems. It provides an estimate of body fat based on height and weight. Your health care  provider can find your BMI and can help you achieve or maintain a healthy weight. For males 20 years and older:  A BMI below 18.5 is considered underweight.  A BMI of 18.5 to 24.9 is normal.  A BMI of 25 to 29.9 is considered overweight.  A BMI of 30 and above is considered obese.  Maintain normal blood lipids and cholesterol by exercising and minimizing your intake of saturated fat. Eat a balanced diet with plenty of fruits and vegetables. Blood tests for lipids and cholesterol should begin at age 52 and be repeated every 5 years. If your lipid or cholesterol levels are high, you are over age 32, or you are at high risk for heart disease, you may need your cholesterol levels checked more frequently.Ongoing high lipid and cholesterol levels should be treated with medicines if diet and exercise are not working.  If you smoke, find out from your health care provider how to quit. If you do not use tobacco, do not start.  Lung cancer screening is recommended for adults aged 1-80 years who are at high risk for developing lung cancer because of a history of smoking. A yearly low-dose CT scan of the lungs is recommended for people who have at least a 30-pack-year history of smoking and are current smokers or have quit within the past 15 years. A pack year of smoking is smoking an average of 1 pack of cigarettes a  day for 1 year (for example, a 30-pack-year history of smoking could mean smoking 1 pack a day for 30 years or 2 packs a day for 15 years). Yearly screening should continue until the smoker has stopped smoking for at least 15 years. Yearly screening should be stopped for people who develop a health problem that would prevent them from having lung cancer treatment.  If you choose to drink alcohol, do not have more than 2 drinks per day. One drink is considered to be 12 oz (360 mL) of beer, 5 oz (150 mL) of wine, or 1.5 oz (45 mL) of liquor.  Avoid the use of street drugs. Do not share needles  with anyone. Ask for help if you need support or instructions about stopping the use of drugs.  High blood pressure causes heart disease and increases the risk of stroke. High blood pressure is more likely to develop in:  People who have blood pressure in the end of the normal range (100-139/85-89 mm Hg).  People who are overweight or obese.  People who are African American.  If you are 75-11 years of age, have your blood pressure checked every 3-5 years. If you are 35 years of age or older, have your blood pressure checked every year. You should have your blood pressure measured twice--once when you are at a hospital or clinic, and once when you are not at a hospital or clinic. Record the average of the two measurements. To check your blood pressure when you are not at a hospital or clinic, you can use:  An automated blood pressure machine at a pharmacy.  A home blood pressure monitor.  If you are 62-21 years old, ask your health care provider if you should take aspirin to prevent heart disease.  Diabetes screening involves taking a blood sample to check your fasting blood sugar level. This should be done once every 3 years after age 51 if you are at a normal weight and without risk factors for diabetes. Testing should be considered at a younger age or be carried out more frequently if you are overweight and have at least 1 risk factor for diabetes.  Colorectal cancer can be detected and often prevented. Most routine colorectal cancer screening begins at the age of 57 and continues through age 46. However, your health care provider may recommend screening at an earlier age if you have risk factors for colon cancer. On a yearly basis, your health care provider may provide home test kits to check for hidden blood in the stool. A small camera at the end of a tube may be used to directly examine the colon (sigmoidoscopy or colonoscopy) to detect the earliest forms of colorectal cancer. Talk to your  health care provider about this at age 62 when routine screening begins. A direct exam of the colon should be repeated every 5-10 years through age 34, unless early forms of precancerous polyps or small growths are found.  People who are at an increased risk for hepatitis B should be screened for this virus. You are considered at high risk for hepatitis B if:  You were born in a country where hepatitis B occurs often. Talk with your health care provider about which countries are considered high risk.  Your parents were born in a high-risk country and you have not received a shot to protect against hepatitis B (hepatitis B vaccine).  You have HIV or AIDS.  You use needles to inject street drugs.  You live  with, or have sex with, someone who has hepatitis B.  You are a man who has sex with other men (MSM).  You get hemodialysis treatment.  You take certain medicines for conditions like cancer, organ transplantation, and autoimmune conditions.  Hepatitis C blood testing is recommended for all people born from [birth year] through 1965 and any individual with known risk factors for hepatitis C.  Healthy men should no longer receive prostate-specific antigen (PSA) blood tests as part of routine cancer screening. Talk to your health care provider about prostate cancer screening.  Testicular cancer screening is not recommended for adolescents or adult males who have no symptoms. Screening includes self-exam, a health care provider exam, and other screening tests. Consult with your health care provider about any symptoms you have or any concerns you have about testicular cancer.  Practice safe sex. Use condoms and avoid high-risk sexual practices to reduce the spread of sexually transmitted infections (STIs).  You should be screened for STIs, including gonorrhea and chlamydia if:  You are sexually active and are younger than 24 years.  You are older than 24 years, and your health care provider tells  you that you are at risk for this type of infection.  Your sexual activity has changed since you were last screened, and you are at an increased risk for chlamydia or gonorrhea. Ask your health care provider if you are at risk.  If you are at risk of being infected with HIV, it is recommended that you take a prescription medicine daily to prevent HIV infection. This is called pre-exposure prophylaxis (PrEP). You are considered at risk if:  You are a man who has sex with other men (MSM).  You are a heterosexual man who is sexually active with multiple partners.  You take drugs by injection.  You are sexually active with a partner who has HIV.  Talk with your health care provider about whether you are at high risk of being infected with HIV. If you choose to begin PrEP, you should first be tested for HIV. You should then be tested every 3 months for as long as you are taking PrEP.  Use sunscreen. Apply sunscreen liberally and repeatedly throughout the day. You should seek shade when your shadow is shorter than you. Protect yourself by wearing long sleeves, pants, a wide-brimmed hat, and sunglasses year round whenever you are outdoors.  Tell your health care provider of new moles or changes in moles, especially if there is a change in shape or color. Also, tell your health care provider if a mole is larger than the size of a pencil eraser.  A one-time screening for abdominal aortic aneurysm (AAA) and surgical repair of large AAAs by ultrasound is recommended for men aged 40-75 years who are current or former smokers.  Stay current with your vaccines (immunizations).   This information is not intended to replace advice given to you by your health care provider. Make sure you discuss any questions you have with your health care provider.   Document Released: 05/31/2008 Document Revised: 12/24/2014 Document Reviewed: 04/30/2011 Elsevier Interactive Patient Education [employer].

## 2015-12-30 NOTE — Progress Notes (Signed)
Subjective:    Patient ID: Alex Bryant, male    DOB: [birth year], 65 y.o.   MRN: [id number]  HPI He is here to establish with a new pcp.   He is here for an annual exam.   He is taking all of his medication daily as prescribed.  He is on medication for his blood pressure, diabetes, cholesterol and he takes clonazepam for sleep.  The clonazepam is not working as well.  He typically wakes up 2 or 3 am every night.  It takes an hour to get back to sleep.  He does not have difficulty falling asleep.  He has tried melatonin in the past and it did not help.    He eats a healthy diet and exercises regularly.  His sugars at home have been 120 in morning, occasionally 170 when he is bad.  He needs to work on improving his diet - could do better at lunchtime.    With quick movements he has transient vertigo, which is new.     Here for medicare wellness.   I have personally reviewed and have noted 1.The patient's medical and social history 2.Their use of alcohol, tobacco or illicit drugs 3.Their current medications and supplements 4.The patient's functional ability including ADL's, fall risks, home safety risks and hearing or visual impairment. 5.Diet and physical activities 6.Evidence for depression or mood disorders  He does not follow with any specialists.     Are there smokers in your home (other than you)? No  Risk Factors Exercise: walking, active Dietary issues discussed:  Working on improving lunch, but otherwise eats healthy   Cardiac risk factors: advanced age, hypertension, hyperlipidemia, and obesity.  Depression Screen  Have you felt down, depressed or hopeless? No  Have you felt little interest or pleasure in doing things?  No  Activities of Daily Living In your present state of health, do you have any difficulty performing the following activities?:  Driving? Yes Managing money?  Yes Feeding yourself?  Yes Getting from bed to chair? Yes Climbing a flight of stairs? Yes Preparing food and eating?: Yes Bathing or showering? Yes Getting dressed: Yes Getting to/using the toilet? Yes Moving around from place to place: Yes In the past year have you fallen or had a near fall?: Yes - may 2016 - slipped on towels   Are you sexually active?  yes  Do you have more than one partner?  N/A  Hearing Difficulties: No Do you often ask people to speak up or repeat themselves? No Do you experience ringing or noises in your ears? No Do you have difficulty understanding soft or whispered voices? No Vision:              Any change in vision: No             Up to date with eye exam: yes Memory:  Do you feel that you have a problem with memory? No  Do you often misplace items? No  Do you feel safe at home?  Yes  Cognitive Testing  Alert, Orientated? Yes  Normal Appearance? Yes  Recall of three objects?  Yes  Can perform simple calculations? Yes  Displays appropriate judgment? Yes  Can read the correct time from a watch face? Yes   Advanced Directives have been discussed with the patient? Yes  Medications and allergies reviewed with patient and updated if appropriate.  Patient Active Problem List   Diagnosis Date Noted  . Plantar fasciitis of  right foot 02/17/2014  . Lumbosacral spondylosis without myelopathy 05/25/2013  . Essential hypertension 01/24/2011  . MEDIAL MENISCUS TEAR, RIGHT 07/06/2010  . Diabetes type 2, uncontrolled ([place]) 01/06/2009  . URTICARIA 01/06/2009  . Tubular adenoma of colon 08/26/2008  . HYPERLIPIDEMIA 02/24/2008  . History of gout 11/14/2007  . Sleep disorder 09/08/2007  . 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 aspirin 81 MG tablet Take 81 mg by mouth daily.      . clonazePAM (KLONOPIN) 0.5 MG tablet TAKE 1/2 TO 1 TABLET BY MOUTH EVERY NIGHT AT BEDTIME  AS NEEDED 30 tablet 0  . glucose blood test strip 1 each by Other route as needed. Use as instructed     . metFORMIN (GLUCOPHAGE-XR) 500 MG 24 hr tablet TAKE 1 TABLET BY MOUTH TWICE DAILY WITH 2 LARGEST MEALS 180 tablet 1  . Omega-3 Fatty Acids (FISH OIL) 1000 MG CAPS Take by mouth daily.      . ramipril (ALTACE) 5 MG capsule TAKE ONE CAPSULE BY MOUTH EVERY DAY 90 capsule 2  . simvastatin (ZOCOR) 20 MG tablet TAKE 1 TABLET BY MOUTH DAILY 90 tablet 1   No current facility-administered medications on file prior to visit.    Past Medical History  Diagnosis Date  . Gout   . Hyperlipidemia   . Diabetes mellitus     AODM  . 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 Colonoscopy w/ polypectomy  2010    Dr Olevia Perches  . Femoral hernia repair      age 35  . Fx right face      plating & pinning of maxilla  . Esi  2014    @L5 -S1 X3   . Lumbosacral fusion  10/17/2013    L5-S1 ; [place]  . Colonoscopy    . Polypectomy  2015    Dr Olevia Perches    Social History   Social History  . Marital Status: Married    Spouse Name: N/A  . Number of Children: N/A  . Years of Education: N/A   Social History Main Topics  . Smoking status: Former Smoker    Quit date: 12/17/1980  . Smokeless tobacco: Never Used     Comment: smoked 1966- 1982, up to < 5 cigarettes / day  . Alcohol Use: 3.6 oz/week    6 Shots of liquor per week     Comment:  socially  . Drug Use: No  . Sexual Activity: Not Asked   Other Topics Concern  . None   Social History Narrative    Family History  Problem Relation Age of Onset  . Hypertension Mother   . Stroke Mother     X37  . Heart attack Father 35  . Diabetes Sister     "pre Diabetes"  . Cancer Neg Hx   . Colon cancer Neg Hx   . Rectal cancer Neg Hx   . Stomach cancer Neg Hx     Review of Systems  Constitutional: Negative for fever, chills, appetite change and unexpected weight change.   HENT: Positive for hearing loss (mild).   Eyes: Negative for visual disturbance.  Respiratory: Negative for cough, shortness of breath and wheezing.   Cardiovascular: Negative for chest pain, palpitations and leg swelling.  Gastrointestinal: Negative for nausea, abdominal pain, diarrhea,  constipation and blood in stool.       No GERD  Genitourinary: Negative for dysuria, hematuria and difficulty urinating.  Musculoskeletal: Negative for myalgias, back pain and arthralgias.  Skin: Negative for rash.  Neurological: Positive for dizziness and numbness (feet, from back surgery). Negative for weakness, light-headedness and headaches.  Psychiatric/Behavioral: Positive for sleep disturbance. Negative for dysphoric mood. The patient is not nervous/anxious.        Objective:   Filed Vitals:   12/30/15 0805  BP: 116/64  Pulse: 61  Temp: 97.9 F (36.6 C)  Resp: 16   Filed Weights   12/30/15 0805  Weight: 224 lb (101.606 kg)   Body mass index is 30.37 kg/(m^2).   Physical Exam  Constitutional: He is oriented to person, place, and time. He appears well-developed and well-nourished. No distress.  HENT:  Head: Normocephalic and atraumatic.  Right Ear: External ear normal.  Left Ear: External ear normal.  Mouth/Throat: Oropharynx is clear and moist.  Eyes: Conjunctivae and EOM are normal.  Neck: Neck supple. No JVD present. No tracheal deviation present. No thyromegaly present.  Cardiovascular: Normal rate, regular rhythm and normal heart sounds.   No murmur heard. Pulmonary/Chest: Effort normal and breath sounds normal. No respiratory distress. He has no wheezes. He has no rales.  Abdominal: Soft. Bowel sounds are normal. He exhibits no distension. There is no tenderness.  Genitourinary:  deferred  Musculoskeletal: He exhibits no edema.  Lymphadenopathy:    He has no cervical adenopathy.  Neurological: He is alert and oriented to person, place, and time.  Skin: Skin is warm and  dry. No rash noted. He is not diaphoretic.  Psychiatric: He has a normal mood and affect. His behavior is normal.        Assessment & Plan:   Wellness Exam: Appropriate screenings asked and are above Memory concerns/difficulties - none Independent of ADLs advanced directives in place Immunizations - deferred any today, reviewed what was recommended for him Last EKG reviewed and was WNL, no repeat today Colonoscopy up to date Eye exam up to date Very active at work - no regimented exercise Diet is overall healthy, but could make some improvements which we disucssed   Physical exam: Screening blood work ordered, consented to hepatitis C Colonoscopy up to date Eye exam up to date, no retinopathy -has next appt scheduled Foot exam up to date Vaccines - deferred any today because he goes on vacation tomorrow - we reviewed the recommended vaccine for him EKG done 2014, WNL - no repeat today Exercise - very active at work - walks 10000 steps daily on average Diet - overall healthy - needs to improve lunch - goes out a lot at work  See Problem List for Assessment and Plan of chronic medical problems.  Follow up in 6 months

## 2015-12-30 NOTE — Assessment & Plan Note (Signed)
Clonazepam 0.5 mg not working as well Will try trazodone to see if that works If we absolutely need to we can try 0.75mg  at night Discussed potential long term affects of taking clonazepam at night - memory Melatonin has not been effective in past

## 2015-12-31 LAB — PSA, TOTAL AND FREE
PSA FREE: 0.47 ng/mL
PSA, Free Pct: 40 % (ref >25)
PSA: 1.17 ng/mL (ref <=4.00)

## 2015-12-31 LAB — HEPATITIS C ANTIBODY: HCV Ab: NEGATIVE

## 2016-01-01 ENCOUNTER — Encounter: Payer: Self-pay | Admitting: Internal Medicine

## 2016-01-23 ENCOUNTER — Other Ambulatory Visit: Payer: Self-pay | Admitting: Internal Medicine

## 2016-01-23 ENCOUNTER — Other Ambulatory Visit: Payer: Self-pay | Admitting: Family

## 2016-01-23 NOTE — Telephone Encounter (Signed)
Please advise, thanks.

## 2016-02-19 DIAGNOSIS — J019 Acute sinusitis, unspecified: Secondary | ICD-10-CM | POA: Diagnosis not present

## 2016-02-19 DIAGNOSIS — B9689 Other specified bacterial agents as the cause of diseases classified elsewhere: Secondary | ICD-10-CM | POA: Diagnosis not present

## 2016-02-19 DIAGNOSIS — R6889 Other general symptoms and signs: Secondary | ICD-10-CM | POA: Diagnosis not present

## 2016-02-20 ENCOUNTER — Other Ambulatory Visit: Payer: Self-pay | Admitting: Family

## 2016-02-21 NOTE — Telephone Encounter (Signed)
Please advise, thanks.

## 2016-02-22 ENCOUNTER — Telehealth: Payer: Self-pay | Admitting: Emergency Medicine

## 2016-02-22 DIAGNOSIS — R0602 Shortness of breath: Secondary | ICD-10-CM | POA: Diagnosis not present

## 2016-02-22 DIAGNOSIS — J018 Other acute sinusitis: Secondary | ICD-10-CM | POA: Diagnosis not present

## 2016-02-22 NOTE — Telephone Encounter (Signed)
RX for Klonopin has been faxed to [facility]

## 2016-02-29 ENCOUNTER — Ambulatory Visit (INDEPENDENT_AMBULATORY_CARE_PROVIDER_SITE_OTHER): Payer: PPO | Admitting: Internal Medicine

## 2016-02-29 ENCOUNTER — Encounter: Payer: Self-pay | Admitting: Internal Medicine

## 2016-02-29 VITALS — BP 138/74 | HR 87 | Temp 98.1°F | Resp 20 | Wt 220.0 lb

## 2016-02-29 DIAGNOSIS — R05 Cough: Secondary | ICD-10-CM | POA: Diagnosis not present

## 2016-02-29 DIAGNOSIS — IMO0001 Reserved for inherently not codable concepts without codable children: Secondary | ICD-10-CM

## 2016-02-29 DIAGNOSIS — E1165 Type 2 diabetes mellitus with hyperglycemia: Secondary | ICD-10-CM | POA: Diagnosis not present

## 2016-02-29 DIAGNOSIS — I10 Essential (primary) hypertension: Secondary | ICD-10-CM | POA: Diagnosis not present

## 2016-02-29 DIAGNOSIS — R059 Cough, unspecified: Secondary | ICD-10-CM | POA: Insufficient documentation

## 2016-02-29 MED ORDER — HYDROCODONE-HOMATROPINE 5-1.5 MG/5ML PO SYRP: 5.0000 mL | ORAL_SOLUTION | Freq: Four times a day (QID) | ORAL | Status: DC | PRN

## 2016-02-29 MED ORDER — LEVOFLOXACIN 250 MG PO TABS: 250.0000 mg | ORAL_TABLET | Freq: Every day | ORAL | Status: DC

## 2016-02-29 NOTE — Patient Instructions (Signed)
Please take all new medication as prescribed - the antibiotic, and cough medicine as needed  (OK to take the hdyrocodone at small doses, as it should not react like the oxycodone)  Please continue all other medications as before, and refills have been done if requested.  Please have the pharmacy call with any other refills you may need.  Please keep your appointments with your specialists as you may have planned

## 2016-02-29 NOTE — Progress Notes (Signed)
Subjective:    Patient ID: Alex Bryant, male    DOB: [birth year], 65 y.o.   MRN: [id number]  HPI  Here with acute onset mild to mod 2 wks worsening ST, HA, general weakness and malaise, with prod cough greenish sputum, but Pt denies chest pain, increased sob or doe, wheezing, orthopnea, PND, increased LE swelling, palpitations, dizziness or syncope.  Was seen at [facility] about 10 days ago, flu test neg, tx with 2 shot (thinks steroid and antibx) then started augmentin to which he has tolerated well, was better to begin, now worsening last few days.  Pt denies new neurological symptoms such as new headache, or facial or extremity weakness or numbness   Pt denies polydipsia, polyuria  Past Medical History  Diagnosis Date  . Gout   . Hyperlipidemia   . Diabetes mellitus     AODM  . 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 Colonoscopy w/ polypectomy  2010    Dr Olevia Perches  . Femoral hernia repair      age 1  . Fx right face      plating & pinning of maxilla  . Esi  2014    @L5 -S1 X3   . Lumbosacral fusion  10/17/2013    L5-S1 ; [place]  . Colonoscopy    . Polypectomy  2015    Dr Olevia Perches    reports that he quit smoking about 35 years ago. He has never used smokeless tobacco. He reports that he drinks about 3.6 oz of alcohol per week. He reports that he does not use illicit drugs. family history includes Diabetes in his sister; Heart attack (age of onset: 8) in his father; Hypertension in his mother; Stroke in his mother. There is no history of Cancer, Colon cancer, Rectal cancer, or Stomach cancer. Allergies  Allergen Reactions  . Oxycodone Anxiety    [name], confused, and constipation   Current Outpatient Prescriptions on File Prior to Visit  Medication Sig Dispense Refill  . aspirin 81 MG tablet Take 81 mg by mouth daily.      . clonazePAM (KLONOPIN) 0.5 MG tablet TAKE ONE-HALF TO 1 TABLET BY MOUTH AT BEDTIME AS  NEEDED 30 tablet 0  . glucose blood test strip 1 each by Other route as needed. Use as instructed     . metFORMIN (GLUCOPHAGE-XR) 500 MG 24 hr tablet TAKE 1 TABLET BY MOUTH TWICE DAILY WITH 2 LARGEST MEALS 180 tablet 1  . Omega-3 Fatty Acids (FISH OIL) 1000 MG CAPS Take by mouth daily.      . ramipril (ALTACE) 5 MG capsule TAKE ONE CAPSULE BY MOUTH EVERY DAY 90 capsule 2  . simvastatin (ZOCOR) 20 MG tablet TAKE 1 TABLET BY MOUTH DAILY 90 tablet 0  . traZODone (DESYREL) 50 MG tablet Take 1-2 tablets (50-100 mg total) by mouth at bedtime as needed for sleep. 60 tablet 3   No current facility-administered medications on file prior to visit.   Review of Systems  Constitutional: Negative for unusual diaphoresis or night sweats HENT: Negative for ringing in ear or discharge Eyes: Negative for double vision or worsening visual disturbance.  Respiratory: Negative for choking and stridor.   Gastrointestinal: Negative for vomiting or other signifcant bowel change Genitourinary: Negative for hematuria or change in urine volume.  Musculoskeletal: Negative for other MSK pain or swelling Skin: Negative for color change and worsening wound.  Neurological:  Negative for tremors and numbness other than noted  Psychiatric/Behavioral: Negative for decreased concentration or agitation other than above       Objective:   Physical Exam BP 138/74 mmHg  Pulse 87  Temp(Src) 98.1 F (36.7 C) (Oral)  Resp 20  Wt 220 lb (99.791 kg)  SpO2 91% VS noted, mild ill Constitutional: Pt appears in no significant distress HENT: Head: NCAT.  Right Ear: External ear normal.  Left Ear: External ear normal.  Bilat tm's with mild erythema.  Max sinus areas mild tender.  Pharynx with mild erythema, no exudate Eyes: . Pupils are equal, round, and reactive to light. Conjunctivae and EOM are normal Neck: Normal range of motion. Neck supple.  Cardiovascular: Normal rate and regular rhythm.   Pulmonary/Chest: Effort normal  and breath sounds decreased bilat without overt rales or wheezing.  Neurological: Pt is alert. Not confused , motor grossly intact Skin: Skin is warm. No rash, no LE edema Psychiatric: Pt behavior is normal. No agitation.     Assessment & Plan:

## 2016-02-29 NOTE — Progress Notes (Signed)
Pre visit review using our clinic review tool, if applicable. No additional management support is needed unless otherwise documented below in the visit note. 

## 2016-03-02 NOTE — Assessment & Plan Note (Signed)
stable overall by history and exam, recent data reviewed with pt, and pt to continue medical treatment as before,  to f/u any worsening symptoms or concerns Lab Results  Component Value Date   HGBA1C 7.7* 12/30/2015

## 2016-03-02 NOTE — Assessment & Plan Note (Signed)
stable overall by history and exam, recent data reviewed with pt, and pt to continue medical treatment as before,  to f/u any worsening symptoms or concerns BP Readings from Last 3 Encounters:  02/29/16 138/74  12/30/15 116/64  05/25/15 138/80

## 2016-03-02 NOTE — Assessment & Plan Note (Signed)
Mild to mod, c/w bronchitis vs pna, declines cxr, for antibx course, cough med prn,  to f/u any worsening symptoms or concerns 

## 2016-03-26 ENCOUNTER — Other Ambulatory Visit: Payer: Self-pay | Admitting: Internal Medicine

## 2016-03-26 NOTE — Telephone Encounter (Signed)
RX faxed to [facility] 

## 2016-04-20 DIAGNOSIS — L918 Other hypertrophic disorders of the skin: Secondary | ICD-10-CM | POA: Diagnosis not present

## 2016-04-20 DIAGNOSIS — D1801 Hemangioma of skin and subcutaneous tissue: Secondary | ICD-10-CM | POA: Diagnosis not present

## 2016-04-20 DIAGNOSIS — C44319 Basal cell carcinoma of skin of other parts of face: Secondary | ICD-10-CM | POA: Diagnosis not present

## 2016-04-20 DIAGNOSIS — L820 Inflamed seborrheic keratosis: Secondary | ICD-10-CM | POA: Diagnosis not present

## 2016-04-20 DIAGNOSIS — D225 Melanocytic nevi of trunk: Secondary | ICD-10-CM | POA: Diagnosis not present

## 2016-04-20 DIAGNOSIS — L821 Other seborrheic keratosis: Secondary | ICD-10-CM | POA: Diagnosis not present

## 2016-04-24 DIAGNOSIS — H35033 Hypertensive retinopathy, bilateral: Secondary | ICD-10-CM | POA: Diagnosis not present

## 2016-04-24 DIAGNOSIS — H524 Presbyopia: Secondary | ICD-10-CM | POA: Diagnosis not present

## 2016-04-24 DIAGNOSIS — E119 Type 2 diabetes mellitus without complications: Secondary | ICD-10-CM | POA: Diagnosis not present

## 2016-04-24 DIAGNOSIS — H2513 Age-related nuclear cataract, bilateral: Secondary | ICD-10-CM | POA: Diagnosis not present

## 2016-04-25 ENCOUNTER — Other Ambulatory Visit: Payer: Self-pay | Admitting: Internal Medicine

## 2016-05-11 ENCOUNTER — Other Ambulatory Visit: Payer: Self-pay | Admitting: Internal Medicine

## 2016-05-24 ENCOUNTER — Other Ambulatory Visit: Payer: Self-pay | Admitting: Internal Medicine

## 2016-05-25 NOTE — Telephone Encounter (Signed)
RX faxed to [facility] 

## 2016-06-14 DIAGNOSIS — C44319 Basal cell carcinoma of skin of other parts of face: Secondary | ICD-10-CM | POA: Diagnosis not present

## 2016-06-14 DIAGNOSIS — Z85828 Personal history of other malignant neoplasm of skin: Secondary | ICD-10-CM | POA: Diagnosis not present

## 2016-06-25 ENCOUNTER — Other Ambulatory Visit: Payer: Self-pay | Admitting: Internal Medicine

## 2016-06-26 NOTE — Telephone Encounter (Signed)
RX faxed to [facility] 

## 2016-07-25 ENCOUNTER — Other Ambulatory Visit: Payer: Self-pay | Admitting: Internal Medicine

## 2016-07-25 NOTE — Telephone Encounter (Signed)
Faxed script back to [facility].../lmb 

## 2016-07-25 NOTE — Telephone Encounter (Signed)
Dr. Quay Burow out of the office this week pls advise on refill...Alex Bryant

## 2016-08-31 DIAGNOSIS — R5383 Other fatigue: Secondary | ICD-10-CM | POA: Diagnosis not present

## 2016-08-31 DIAGNOSIS — Z79899 Other long term (current) drug therapy: Secondary | ICD-10-CM | POA: Diagnosis not present

## 2016-08-31 DIAGNOSIS — E119 Type 2 diabetes mellitus without complications: Secondary | ICD-10-CM | POA: Diagnosis not present

## 2016-08-31 DIAGNOSIS — J22 Unspecified acute lower respiratory infection: Secondary | ICD-10-CM | POA: Diagnosis not present

## 2016-08-31 DIAGNOSIS — Z0000 Encounter for general adult medical examination without abnormal findings: Secondary | ICD-10-CM | POA: Diagnosis not present

## 2016-08-31 DIAGNOSIS — Z125 Encounter for screening for malignant neoplasm of prostate: Secondary | ICD-10-CM | POA: Diagnosis not present

## 2016-08-31 DIAGNOSIS — I10 Essential (primary) hypertension: Secondary | ICD-10-CM | POA: Diagnosis not present

## 2016-09-05 ENCOUNTER — Other Ambulatory Visit: Payer: Self-pay | Admitting: Family

## 2016-09-05 NOTE — Telephone Encounter (Signed)
RX faxed to [facility] 

## 2016-09-26 ENCOUNTER — Telehealth: Payer: Self-pay | Admitting: *Deleted

## 2016-09-26 MED ORDER — FREESTYLE LANCETS MISC: 1.0000 | Freq: Two times a day (BID) | 3 refills | Status: AC

## 2016-09-26 MED ORDER — GLUCOSE BLOOD VI STRP: 1.0000 | ORAL_STRIP | Freq: Two times a day (BID) | 3 refills | Status: DC

## 2016-09-26 NOTE — Telephone Encounter (Signed)
Wife left msg on triage stating husband needing rx for his Freestyle BS monitor. Called pt/wife back no answer LMOM rx sent to [facility]...Johny Chess

## 2016-10-11 ENCOUNTER — Other Ambulatory Visit: Payer: Self-pay | Admitting: Internal Medicine

## 2016-11-13 ENCOUNTER — Other Ambulatory Visit: Payer: Self-pay | Admitting: Internal Medicine

## 2016-11-16 ENCOUNTER — Other Ambulatory Visit: Payer: Self-pay | Admitting: Internal Medicine

## 2016-11-16 ENCOUNTER — Other Ambulatory Visit: Payer: Self-pay | Admitting: Emergency Medicine

## 2016-11-16 MED ORDER — CLONAZEPAM 0.5 MG PO TABS: ORAL_TABLET | ORAL | 0 refills | Status: DC

## 2016-11-16 NOTE — Progress Notes (Signed)
rx faxed to [facility].  

## 2016-11-19 ENCOUNTER — Encounter (HOSPITAL_COMMUNITY): Payer: Self-pay | Admitting: *Deleted

## 2016-11-19 ENCOUNTER — Inpatient Hospital Stay (HOSPITAL_COMMUNITY)
Admission: EM | Admit: 2016-11-19 | Discharge: 2016-11-21 | DRG: 040 | Disposition: A | Discharge status: Home / Self Care | Payer: PPO | Attending: Internal Medicine | Admitting: Internal Medicine

## 2016-11-19 ENCOUNTER — Emergency Department (HOSPITAL_COMMUNITY): Payer: PPO

## 2016-11-19 ENCOUNTER — Observation Stay (HOSPITAL_COMMUNITY): Payer: PPO

## 2016-11-19 DIAGNOSIS — R202 Paresthesia of skin: Secondary | ICD-10-CM

## 2016-11-19 DIAGNOSIS — I6521 Occlusion and stenosis of right carotid artery: Secondary | ICD-10-CM | POA: Diagnosis not present

## 2016-11-19 DIAGNOSIS — Z7984 Long term (current) use of oral hypoglycemic drugs: Secondary | ICD-10-CM | POA: Diagnosis not present

## 2016-11-19 DIAGNOSIS — Z8673 Personal history of transient ischemic attack (TIA), and cerebral infarction without residual deficits: Secondary | ICD-10-CM | POA: Diagnosis present

## 2016-11-19 DIAGNOSIS — I639 Cerebral infarction, unspecified: Secondary | ICD-10-CM

## 2016-11-19 DIAGNOSIS — E1159 Type 2 diabetes mellitus with other circulatory complications: Secondary | ICD-10-CM | POA: Diagnosis present

## 2016-11-19 DIAGNOSIS — Z885 Allergy status to narcotic agent status: Secondary | ICD-10-CM | POA: Diagnosis not present

## 2016-11-19 DIAGNOSIS — Z823 Family history of stroke: Secondary | ICD-10-CM | POA: Diagnosis not present

## 2016-11-19 DIAGNOSIS — E119 Type 2 diabetes mellitus without complications: Secondary | ICD-10-CM | POA: Diagnosis present

## 2016-11-19 DIAGNOSIS — R531 Weakness: Secondary | ICD-10-CM | POA: Diagnosis not present

## 2016-11-19 DIAGNOSIS — Z981 Arthrodesis status: Secondary | ICD-10-CM | POA: Diagnosis not present

## 2016-11-19 DIAGNOSIS — E785 Hyperlipidemia, unspecified: Secondary | ICD-10-CM | POA: Diagnosis present

## 2016-11-19 DIAGNOSIS — I6340 Cerebral infarction due to embolism of unspecified cerebral artery: Secondary | ICD-10-CM | POA: Diagnosis not present

## 2016-11-19 DIAGNOSIS — Z8249 Family history of ischemic heart disease and other diseases of the circulatory system: Secondary | ICD-10-CM

## 2016-11-19 DIAGNOSIS — I6330 Cerebral infarction due to thrombosis of unspecified cerebral artery: Secondary | ICD-10-CM | POA: Insufficient documentation

## 2016-11-19 DIAGNOSIS — E1165 Type 2 diabetes mellitus with hyperglycemia: Secondary | ICD-10-CM | POA: Diagnosis not present

## 2016-11-19 DIAGNOSIS — I10 Essential (primary) hypertension: Secondary | ICD-10-CM | POA: Diagnosis not present

## 2016-11-19 DIAGNOSIS — I7774 Dissection of vertebral artery: Secondary | ICD-10-CM | POA: Diagnosis present

## 2016-11-19 DIAGNOSIS — Z7982 Long term (current) use of aspirin: Secondary | ICD-10-CM

## 2016-11-19 DIAGNOSIS — G8191 Hemiplegia, unspecified affecting right dominant side: Secondary | ICD-10-CM | POA: Diagnosis not present

## 2016-11-19 DIAGNOSIS — I63413 Cerebral infarction due to embolism of bilateral middle cerebral arteries: Secondary | ICD-10-CM

## 2016-11-19 DIAGNOSIS — M109 Gout, unspecified: Secondary | ICD-10-CM | POA: Diagnosis present

## 2016-11-19 DIAGNOSIS — Z87891 Personal history of nicotine dependence: Secondary | ICD-10-CM | POA: Diagnosis not present

## 2016-11-19 DIAGNOSIS — R200 Anesthesia of skin: Secondary | ICD-10-CM | POA: Diagnosis present

## 2016-11-19 DIAGNOSIS — Z79899 Other long term (current) drug therapy: Secondary | ICD-10-CM

## 2016-11-19 DIAGNOSIS — Z833 Family history of diabetes mellitus: Secondary | ICD-10-CM | POA: Diagnosis not present

## 2016-11-19 DIAGNOSIS — I6789 Other cerebrovascular disease: Secondary | ICD-10-CM | POA: Diagnosis not present

## 2016-11-19 DIAGNOSIS — M6281 Muscle weakness (generalized): Secondary | ICD-10-CM | POA: Diagnosis not present

## 2016-11-19 DIAGNOSIS — R278 Other lack of coordination: Secondary | ICD-10-CM | POA: Diagnosis present

## 2016-11-19 DIAGNOSIS — R29898 Other symptoms and signs involving the musculoskeletal system: Secondary | ICD-10-CM

## 2016-11-19 DIAGNOSIS — IMO0002 Reserved for concepts with insufficient information to code with codable children: Secondary | ICD-10-CM

## 2016-11-19 DIAGNOSIS — I63412 Cerebral infarction due to embolism of left middle cerebral artery: Secondary | ICD-10-CM | POA: Diagnosis not present

## 2016-11-19 HISTORY — DX: Essential (primary) hypertension: I10

## 2016-11-19 HISTORY — DX: Cerebral infarction, unspecified: I63.9

## 2016-11-19 LAB — COMPREHENSIVE METABOLIC PANEL
ALBUMIN: 4.1 g/dL (ref 3.5–5.0)
ALT: 21 U/L (ref 17–63)
AST: 18 U/L (ref 15–41)
Alkaline Phosphatase: 66 U/L (ref 38–126)
Anion gap: 8 (ref 5–15)
BUN: 14 mg/dL (ref 6–20)
CHLORIDE: 105 mmol/L (ref 101–111)
CO2: 25 mmol/L (ref 22–32)
CREATININE: 1.12 mg/dL (ref 0.61–1.24)
Calcium: 9.4 mg/dL (ref 8.9–10.3)
GFR calc Af Amer: >60 mL/min (ref >60)
Glucose, Bld: 191 mg/dL — ABNORMAL HIGH (ref 65–99)
POTASSIUM: 4.3 mmol/L (ref 3.5–5.1)
SODIUM: 138 mmol/L (ref 135–145)
Total Bilirubin: 1.1 mg/dL (ref 0.3–1.2)
Total Protein: 6.9 g/dL (ref 6.5–8.1)

## 2016-11-19 LAB — DIFFERENTIAL
BASOS ABS: 0.1 10*3/uL (ref 0.0–0.1)
BASOS PCT: 1 %
EOS ABS: 0.8 10*3/uL — AB (ref 0.0–0.7)
Eosinophils Relative: 11 %
Lymphocytes Relative: 34 %
Lymphs Abs: 2.5 10*3/uL (ref 0.7–4.0)
MONOS PCT: 5 %
Monocytes Absolute: 0.4 10*3/uL (ref 0.1–1.0)
NEUTROS ABS: 3.7 10*3/uL (ref 1.7–7.7)
NEUTROS PCT: 49 %

## 2016-11-19 LAB — URINALYSIS, ROUTINE W REFLEX MICROSCOPIC
BILIRUBIN URINE: NEGATIVE
GLUCOSE, UA: 100 mg/dL — AB
HGB URINE DIPSTICK: NEGATIVE
Ketones, ur: NEGATIVE mg/dL
Leukocytes, UA: NEGATIVE
Nitrite: NEGATIVE
PROTEIN: NEGATIVE mg/dL
SPECIFIC GRAVITY, URINE: 1.013 (ref 1.005–1.030)
pH: 6 (ref 5.0–8.0)

## 2016-11-19 LAB — I-STAT TROPONIN, ED: TROPONIN I, POC: 0 ng/mL (ref 0.00–0.08)

## 2016-11-19 LAB — I-STAT CHEM 8, ED
BUN: 17 mg/dL (ref 6–20)
CHLORIDE: 103 mmol/L (ref 101–111)
Calcium, Ion: 1.2 mmol/L (ref 1.15–1.40)
Creatinine, Ser: 1.2 mg/dL (ref 0.61–1.24)
GLUCOSE: 189 mg/dL — AB (ref 65–99)
HEMATOCRIT: 41 % (ref 39.0–52.0)
HEMOGLOBIN: 13.9 g/dL (ref 13.0–17.0)
POTASSIUM: 4.6 mmol/L (ref 3.5–5.1)
Sodium: 138 mmol/L (ref 135–145)
TCO2: 25 mmol/L (ref 0–100)

## 2016-11-19 LAB — RAPID URINE DRUG SCREEN, HOSP PERFORMED
AMPHETAMINES: NOT DETECTED
BARBITURATES: NOT DETECTED
BENZODIAZEPINES: NOT DETECTED
COCAINE: NOT DETECTED
Opiates: NOT DETECTED
Tetrahydrocannabinol: NOT DETECTED

## 2016-11-19 LAB — CBC
HEMATOCRIT: 40.9 % (ref 39.0–52.0)
Hemoglobin: 14.7 g/dL (ref 13.0–17.0)
MCH: 30.8 pg (ref 26.0–34.0)
MCHC: 35.9 g/dL (ref 30.0–36.0)
MCV: 85.6 fL (ref 78.0–100.0)
PLATELETS: 207 10*3/uL (ref 150–400)
RBC: 4.78 MIL/uL (ref 4.22–5.81)
RDW: 12.9 % (ref 11.5–15.5)
WBC: 7.5 10*3/uL (ref 4.0–10.5)

## 2016-11-19 LAB — GLUCOSE, CAPILLARY: Glucose-Capillary: 210 mg/dL — ABNORMAL HIGH (ref 65–99)

## 2016-11-19 LAB — PROTIME-INR
INR: 1.07
PROTHROMBIN TIME: 14 s (ref 11.4–15.2)

## 2016-11-19 LAB — APTT: APTT: 28 s (ref 24–36)

## 2016-11-19 LAB — ETHANOL

## 2016-11-19 LAB — CBG MONITORING, ED: GLUCOSE-CAPILLARY: 173 mg/dL — AB (ref 65–99)

## 2016-11-19 MED ORDER — ACETAMINOPHEN 325 MG PO TABS: 650.0000 mg | ORAL_TABLET | ORAL | Status: DC | PRN

## 2016-11-19 MED ORDER — STROKE: EARLY STAGES OF RECOVERY BOOK
Freq: Once | Status: AC
  Administered 2016-11-19
  Filled 2016-11-19 (×2): qty 1

## 2016-11-19 MED ORDER — ASPIRIN 300 MG RE SUPP: 300.0000 mg | Freq: Every day | RECTAL | Status: DC

## 2016-11-19 MED ORDER — INSULIN ASPART 100 UNIT/ML ~~LOC~~ SOLN
0.0000 [IU] | Freq: Three times a day (TID) | SUBCUTANEOUS | Status: DC
  Administered 2016-11-20: 2 [IU] via SUBCUTANEOUS
  Administered 2016-11-20: 1 [IU] via SUBCUTANEOUS
  Administered 2016-11-20 – 2016-11-21 (×2): 2 [IU] via SUBCUTANEOUS

## 2016-11-19 MED ORDER — ASPIRIN 325 MG PO TABS
325.0000 mg | ORAL_TABLET | Freq: Every day | ORAL | Status: DC
  Administered 2016-11-19 – 2016-11-20 (×2): 325 mg via ORAL
  Filled 2016-11-19 (×2): qty 1

## 2016-11-19 MED ORDER — IOPAMIDOL (ISOVUE-370) INJECTION 76%
INTRAVENOUS | Status: AC
  Administered 2016-11-19: 50 mL
  Filled 2016-11-19: qty 50

## 2016-11-19 MED ORDER — SIMVASTATIN 20 MG PO TABS
20.0000 mg | ORAL_TABLET | Freq: Every day | ORAL | Status: DC
  Administered 2016-11-19 – 2016-11-20 (×2): 20 mg via ORAL
  Filled 2016-11-19 (×2): qty 1

## 2016-11-19 MED ORDER — ENOXAPARIN SODIUM 40 MG/0.4ML ~~LOC~~ SOLN
40.0000 mg | SUBCUTANEOUS | Status: DC
  Administered 2016-11-19 – 2016-11-20 (×2): 40 mg via SUBCUTANEOUS
  Filled 2016-11-19 (×2): qty 0.4

## 2016-11-19 MED ORDER — ACETAMINOPHEN 650 MG RE SUPP: 650.0000 mg | RECTAL | Status: DC | PRN

## 2016-11-19 MED ORDER — CLONAZEPAM 0.5 MG PO TABS: 0.2500 mg | ORAL_TABLET | Freq: Every evening | ORAL | Status: DC | PRN

## 2016-11-19 NOTE — Code Documentation (Signed)
65yo male arriving to [facility] via private vehicle at 1310.  Patient was driving in the car today with his son when he had sudden onset right arm numbness and difficulty using his right arm at 1245.  Patient presented to the ED where a code stroke was activated.  Patient to CT.  Stroke team to the bedside.  NIHSS 3, see documentation for details and code stroke times.  Patient with RUE drift, ataxia and decreased sensation on exam.  Dr. Leonel Ramsay at the bedside.  No treatment with tPA at this time d/t mild symptoms, however, patient remains in the window to treat with tPA until 1715 should symptoms worsen.  Patient to be admitted for stroke work-up.  Bedside handoff with ED RN Judson Roch.

## 2016-11-19 NOTE — ED Notes (Signed)
Pt is 65 yo, was driving in his car at 1245 when he had sudden onset of right sided weakness, witnessed by his son who was in the car with him. Upon arrival he had right sided weakness, ataxia, right sided sensory deficit with NIH 3. Passed his swallow evaluation. Was in TPA window until 1715 with every 15 VS, 30 neuros. Hospitalist is admitting. Pt is a x 4. Neuro at baseline, no deficits at current. Pt's wife at bedside. Has ordered a meal tray, waiting for MRI and inpatient bed assigned. Patient is very nice, man with supportive family. Pt works full-time in [employer].

## 2016-11-19 NOTE — Progress Notes (Signed)
Pt admitted from ED with stroke like symptoms, alert and oriented, c/o of slight headache, pt settled in bed with call light and family at bedside, tele monitor put and verified on pt, v/s stable, was however reassured and will continue to monitor. Obasogie-Asidi, Rudolpho Claxton Efe

## 2016-11-19 NOTE — ED Notes (Signed)
Pt remains in mri.

## 2016-11-19 NOTE — ED Notes (Signed)
EKG given to Dr. Knott 

## 2016-11-19 NOTE — Consult Note (Deleted)
Requesting Physician: Dr. Laneta Simmers    Chief Complaint: right arm numbness  History obtained from:  Patient    HPI:                                                                                                                                         Alex Bryant is an 65 y.o. male with past medical history of hyperlipidemia and diabetes. Patient apparently was driving with a coworker today in his car when suddenly he noted that his right arm felt numb. Per coworker he was shaking his arm trying to get the sensation back. For this reason he was brought to the emergency department. Patient was in triage 1 code stroke was called. On initial evaluation patient was alert and oriented able to talk patient with you move all extremities but stated that his right arm felt. CT scan of brain was obtained and did not show any acute intracranial bleed or abnormalities. TPA was discussed with patient however due to minimal symptoms the decision to not give TPA was made.  Date last known well: Date: 11/19/2016 Time last known well: Time: 12:45 tPA Given: No: minimal symptoms  Past Medical History:  Diagnosis Date  . Diabetes mellitus    AODM  . Gout   . Hyperlipidemia   . Tick bite [id number]   Erlichiosis;Eulis Canner MD, ID    Past Surgical History:  Procedure Laterality Date  . COLONOSCOPY    . COLONOSCOPY W/ POLYPECTOMY  2010   Dr Olevia Perches  . ESI  2014   @L5 -S1 X3   . FEMORAL HERNIA REPAIR     age 73  . fx right face     plating & pinning of maxilla  . lumbosacral fusion  10/17/2013   L5-S1 ; [place]  . POLYPECTOMY  2015   Dr Olevia Perches    Family History  Problem Relation Age of Onset  . Heart attack Father 39  . Hypertension Mother   . Stroke Mother     X21  . Diabetes Sister     "pre Diabetes"  . Cancer Neg Hx   . Colon cancer Neg Hx   . Rectal cancer Neg Hx   . Stomach cancer Neg Hx    Social History:  reports that he quit smoking about 35 years ago. He has never used smokeless  tobacco. He reports that he drinks about 3.6 oz of alcohol per week . He reports that he does not use drugs.  Allergies:  Allergies  Allergen Reactions  . Oxycodone Anxiety    [name], confused, and constipation    Medications:  No current facility-administered medications for this encounter.    Current Outpatient Prescriptions  Medication Sig Dispense Refill  . aspirin 81 MG tablet Take 81 mg by mouth daily.      . clonazePAM (KLONOPIN) 0.5 MG tablet TAKE ONE-HALF TO 1 TABLET BY MOUTH EVERY NIGHT AT BEDTIME AS NEEDED--- needs appt for furthers refills 30 tablet 0  . glucose blood (FREESTYLE LITE) test strip 1 each by Other route 2 (two) times daily. Use to check blood sugars twice a day. Dx E11.9 100 each 3  . glucose blood test strip 1 each by Other route as needed. Use as instructed     . HYDROcodone-homatropine (HYCODAN) 5-1.5 MG/5ML syrup Take 5 mLs by mouth every 6 (six) hours as needed for cough. 180 mL 0  . Lancets (FREESTYLE) lancets 1 each by Other route 2 (two) times daily. Use to help check blood sugar twice a day. Dx E11.9 100 each 3  . levofloxacin (LEVAQUIN) 250 MG tablet Take 1 tablet (250 mg total) by mouth daily. 10 tablet 0  . metFORMIN (GLUCOPHAGE-XR) 500 MG 24 hr tablet TAKE 1 TABLET BY MOUTH TWICE DAILY WITH 2 LARGEST MEALS 180 tablet 0  . Omega-3 Fatty Acids (FISH OIL) 1000 MG CAPS Take by mouth daily.      . ramipril (ALTACE) 5 MG capsule TAKE 1 CAPSULE(5 MG) BY MOUTH DAILY 90 capsule 0  . simvastatin (ZOCOR) 20 MG tablet TAKE 1 TABLET BY MOUTH DAILY 90 tablet 0  . traZODone (DESYREL) 50 MG tablet Take 1-2 tablets (50-100 mg total) by mouth at bedtime as needed for sleep. 60 tablet 3     ROS:                                                                                                                                       History obtained from  the patient  General ROS: negative for - chills, fatigue, fever, night sweats, weight gain or weight loss Psychological ROS: negative for - behavioral disorder, hallucinations, memory difficulties, mood swings or suicidal ideation Ophthalmic ROS: negative for - blurry vision, double vision, eye pain or loss of vision ENT ROS: negative for - epistaxis, nasal discharge, oral lesions, sore throat, tinnitus or vertigo Allergy and Immunology ROS: negative for - hives or itchy/watery eyes Hematological and Lymphatic ROS: negative for - bleeding problems, bruising or swollen lymph nodes Endocrine ROS: negative for - galactorrhea, hair pattern changes, polydipsia/polyuria or temperature intolerance Respiratory ROS: negative for - cough, hemoptysis, shortness of breath or wheezing Cardiovascular ROS: negative for - chest pain, dyspnea on exertion, edema or irregular heartbeat Gastrointestinal ROS: negative for - abdominal pain, diarrhea, hematemesis, nausea/vomiting or stool incontinence Genito-Urinary ROS: negative for - dysuria, hematuria, incontinence or urinary frequency/urgency Musculoskeletal ROS: negative for - joint swelling or muscular weakness Neurological ROS: as noted in HPI Dermatological ROS: negative for rash and skin lesion changes  Neurologic  Examination:                                                                                                      Blood pressure 181/94, pulse 62, resp. rate 16, height 6' 0.05" (1.83 m), SpO2 100 %.  HEENT-  Normocephalic, no lesions, without obvious abnormality.  Normal external eye and conjunctiva.  Normal TM's bilaterally.  Normal auditory canals and external ears. Normal external nose, mucus membranes and septum.  Normal pharynx. Cardiovascular- S1, S2 normal, pulses palpable throughout   Lungs- chest clear, no wheezing, rales, normal symmetric air entry Abdomen- normal findings: no bruits heard Extremities- no edema Lymph-no adenopathy  palpable Musculoskeletal-no joint tenderness, deformity or swelling Skin-warm and dry, no hyperpigmentation, vitiligo, or suspicious lesions  Neurological Examination Mental Status: Alert, oriented, thought content appropriate.  Speech fluent without evidence of aphasia.  Able to follow 3 step commands without difficulty. Cranial Nerves: II: Visual fields grossly normal,  III,IV, VI: ptosis not present, extra-ocular motions intact bilaterally, pupils equal, round, reactive to light and accommodation V,VII: smile symmetric, facial light touch sensation normal bilaterally VIII: hearing normal bilaterally IX,X: uvula rises symmetrically XI: bilateral shoulder shrug XII: midline tongue extension Motor: Right : Upper extremity   4/5    Left:     Upper extremity   5/5  Lower extremity   5/5     Lower extremity   5/5 --Patient showed clumsy finger to finger movements on the right hand. Tone and bulk:normal tone throughout; no atrophy noted Sensory: Decreased sensation to light touch along the right arm.  Deep Tendon Reflexes: 2+ and symmetric throughout Plantars: Right: downgoing   Left: downgoing Cerebellar: Dysmetric finger-to-nose with the right finger to nose left finger-nose is within normal limits,  and normal heel-to-shin test Gait: Not tested       Lab Results: Basic Metabolic Panel:  Recent Labs Lab 11/19/16 1335  NA 138  K 4.6  CL 103  GLUCOSE 189*  BUN 17  CREATININE 1.20    Liver Function Tests: No results for input(s): AST, ALT, ALKPHOS, BILITOT, PROT, ALBUMIN in the last 168 hours. No results for input(s): LIPASE, AMYLASE in the last 168 hours. No results for input(s): AMMONIA in the last 168 hours.  CBC:  Recent Labs Lab 11/19/16 1335  HGB 13.9  HCT 41.0    Cardiac Enzymes: No results for input(s): CKTOTAL, CKMB, CKMBINDEX, TROPONINI in the last 168 hours.  Lipid Panel: No results for input(s): CHOL, TRIG, HDL, CHOLHDL, VLDL, LDLCALC in the last  168 hours.  CBG:  Recent Labs Lab 11/19/16 1319  GLUCAP 173*    Microbiology: No results found for this or any previous visit.  Coagulation Studies: No results for input(s): LABPROT, INR in the last 72 hours.  Imaging: No results found.     Assessment and plan discussed with with attending physician and they are in agreement.    Etta Quill PA-C [facility] [phone]  11/19/2016, 1:40 PM   Assessment: 65 y.o. male presented in the ED with sudden onset of right arm numbness and mild  weakness. Due to mild symptoms TPA was not administered. Symptoms have not resolved at this time. On exam patient shows decreased sensation on his right arm along with clumsy finger to finger movements in his right hand and dysmetric finger to nose with the right upper extremity. CT of head showed no acute bleed or acute stroke.  Stroke Risk Factors - diabetes mellitus and hypertension  Recommend 1. HgbA1c, fasting lipid panel 2. MRI, MRA  of the brain without contrast 3. PT consult, OT consult, Speech consult 4. Echocardiogram 5. Carotid dopplers 6. Prophylactic therapy-Antiplatelet med: Aspirin - dose 325 mg daily 7. Risk factor modification 8. Telemetry monitoring 9. Frequent neuro checks 10 NPO until passes stroke swallow screen 11 please page stroke NP  Or  PA  Or MD from 8am -4 pm  as this patient from this time will be  followed by the stroke.   You can look them up on [email or web address]  Password [email or web address]

## 2016-11-19 NOTE — H&P (Signed)
[facility] History and Physical  KIWANE FROHMAN [id number] DOB: 10-Feb-1951 DOA: 11/19/2016   PCP: Binnie Rail, MD  Specialists: None  Chief Complaint: Right arm weakness  HPI: Alex Bryant is a 65 y.o. male with a past medical history of diabetes on metformin, hypertension, hypercholesterolemia, history of gout who was in his usual state of health till about 12:45 PM today when his right arm went limp. It felt weightless. He couldn't control it. He started having tingling and numbness. Patient did not have any difficulty speaking. He did not have any visual impairment. He did not have any headaches. No falls or injuries. No chest pain or shortness of breath. No nausea, vomiting. He's never had similar symptoms before. He is right-handed. Patient was driving when this occurred. Patient immediately came into the emergency department. His son was with him when this occurred. Patient was found to have right arm weakness. At that time code stroke was called. Neurology saw the patient. However, his symptoms started improving. He states that he is almost back to his baseline.  In the ED, as mentioned above, he was seen by neurology. Patient underwent a CT angiogram of the head and neck. Patient wanted to be admitted for stroke workup as there is a suspicion of a small stroke despite the negative scans.  Home Medications: Prior to Admission medications   Medication Sig Start Date End Date Taking? Authorizing Provider  aspirin 81 MG tablet Take 81 mg by mouth daily.     Yes Historical Provider, MD  clonazePAM (KLONOPIN) 0.5 MG tablet TAKE ONE-HALF TO 1 TABLET BY MOUTH EVERY NIGHT AT BEDTIME AS NEEDED--- needs appt for furthers refills Patient taking differently: Take 0.25-0.5 mg by mouth at bedtime as needed (for sleep).  11/16/16  Yes Binnie Rail, MD  colchicine 0.6 MG tablet Take 0.6 mg by mouth See admin instructions. Once a day beginning at onset of gout symptoms (as needed)   Yes  Historical Provider, MD  glucose blood (FREESTYLE LITE) test strip 1 each by Other route 2 (two) times daily. Use to check blood sugars twice a day. Dx E11.9 09/26/16  Yes Binnie Rail, MD  glucose blood test strip 1 each by Other route as needed. Use as instructed    Yes Historical Provider, MD  Lancets (FREESTYLE) lancets 1 each by Other route 2 (two) times daily. Use to help check blood sugar twice a day. Dx E11.9 09/26/16  Yes Binnie Rail, MD  metFORMIN (GLUCOPHAGE-XR) 500 MG 24 hr tablet TAKE 1 TABLET BY MOUTH TWICE DAILY WITH 2 LARGEST MEALS Patient taking differently: Take 500 mg by mouth two times a day with largest meals 05/15/16  Yes Binnie Rail, MD  Omega-3 Fatty Acids (FISH OIL) 1000 MG CAPS Take 1 capsule by mouth daily.    Yes Historical Provider, MD  ramipril (ALTACE) 5 MG capsule TAKE 1 CAPSULE(5 MG) BY MOUTH DAILY 11/13/16  Yes Binnie Rail, MD  simvastatin (ZOCOR) 20 MG tablet TAKE 1 TABLET BY MOUTH DAILY Patient taking differently: Take 20 mg by mouth once a day 01/24/16  Yes Golden Circle, FNP    Allergies:  Allergies  Allergen Reactions  . Oxycodone Anxiety    [name], confused, and constipation    Past Medical History: Past Medical History:  Diagnosis Date  . Diabetes mellitus    AODM  . Gout   . Hyperlipidemia   . Hypertension   . Tick bite [id number]   Erlichiosis;Tim  Orene Desanctis MD, ID    Past Surgical History:  Procedure Laterality Date  . COLONOSCOPY    . COLONOSCOPY W/ POLYPECTOMY  2010   Dr Olevia Perches  . ESI  2014   @L5 -S1 X3   . FEMORAL HERNIA REPAIR     age 85  . fx right face     plating & pinning of maxilla  . lumbosacral fusion  10/17/2013   L5-S1 ; [place]  . POLYPECTOMY  2015   Dr Olevia Perches    Social History: Patient lives in [place] with his wife. He is in the real estate business. He quit smoking 30 years ago and was smoking about a third pack every week. Drinks 3-4 glasses of wine weekly basis. Denies any illicit drug use.  Family  History:  Family History  Problem Relation Age of Onset  . Heart attack Father 60  . Hypertension Mother   . Stroke Mother     X47  . Diabetes Sister     "pre Diabetes"  . Cancer Neg Hx   . Colon cancer Neg Hx   . Rectal cancer Neg Hx   . Stomach cancer Neg Hx      Review of Systems - History obtained from the patient General ROS: negative Psychological ROS: negative Ophthalmic ROS: negative ENT ROS: negative Allergy and Immunology ROS: negative Hematological and Lymphatic ROS: negative Endocrine ROS: negative Respiratory ROS: no cough, shortness of breath, or wheezing Cardiovascular ROS: no chest pain or dyspnea on exertion Gastrointestinal ROS: no abdominal pain, change in bowel habits, or black or bloody stools Genito-Urinary ROS: no dysuria, trouble voiding, or hematuria Musculoskeletal ROS: negative Neurological ROS: as in hpi Dermatological ROS: negative  Physical Examination  Vitals:   11/19/16 1645 11/19/16 1700 11/19/16 1715 11/19/16 1730  BP: 152/87 153/85 (!) 146/106 (!) 154/101  Pulse: 75 73 72 72  Resp: 12 23 23 20   Temp:      TempSrc:      SpO2: 96% 98% 97% 97%  Weight:      Height:        BP (!) 154/101   Pulse 72   Temp 97.8 F (36.6 C) (Oral)   Resp 20   Ht 6' 0.05" (1.83 m)   Wt 100.2 kg (220 lb 14.4 oz)   SpO2 97%   BMI 29.92 kg/m   General appearance: alert, cooperative, appears stated age and no distress Head: Normocephalic, without obvious abnormality, atraumatic Eyes: conjunctivae/corneas clear. PERRL, EOM's intact.  Throat: lips, mucosa, and tongue normal; teeth and gums normal Neck: no adenopathy, no carotid bruit, no JVD, supple, symmetrical, trachea midline and thyroid not enlarged, symmetric, no tenderness/mass/nodules Back: symmetric, no curvature. ROM normal. No CVA tenderness. Resp: clear to auscultation bilaterally Cardio: regular rate and rhythm, S1, S2 normal, no murmur, click, rub or gallop GI: soft, non-tender; bowel  sounds normal; no masses,  no organomegaly Extremities: extremities normal, atraumatic, no cyanosis or edema Pulses: 2+ and symmetric Skin: Skin color, texture, turgor normal. No rashes or lesions Lymph nodes: Cervical, supraclavicular, and axillary nodes normal. Neurologic: Awake and alert. Oriented 3. Cranial nerves II-12 intact. No facial asymmetry. No pronator drift. Mild difficulty noted with finger-to-nose on the right. Strength is 5 out of 5 bilateral upper and lower extremities.   Labs on Admission: I have personally reviewed following labs and imaging studies  CBC:  Recent Labs Lab 11/19/16 1315 11/19/16 1335  WBC 7.5  --   NEUTROABS 3.7  --  HGB 14.7 13.9  HCT 40.9 41.0  MCV 85.6  --   PLT 207  --    Basic Metabolic Panel:  Recent Labs Lab 11/19/16 1315 11/19/16 1335  NA 138 138  K 4.3 4.6  CL 105 103  CO2 25  --   GLUCOSE 191* 189*  BUN 14 17  CREATININE 1.12 1.20  CALCIUM 9.4  --    GFR: Estimated Creatinine Clearance: 74.3 mL/min (by C-G formula based on SCr of 1.2 mg/dL). Liver Function Tests:  Recent Labs Lab 11/19/16 1315  AST 18  ALT 21  ALKPHOS 66  BILITOT 1.1  PROT 6.9  ALBUMIN 4.1   Coagulation Profile:  Recent Labs Lab 11/19/16 1315  INR 1.07   CBG:  Recent Labs Lab 11/19/16 1319  GLUCAP 173*   Urine analysis:    Component Value Date/Time   COLORURINE YELLOW 11/19/2016 1503   APPEARANCEUR CLEAR 11/19/2016 1503   LABSPEC 1.013 11/19/2016 1503   PHURINE 6.0 11/19/2016 1503   GLUCOSEU 100 (A) 11/19/2016 1503   HGBUR NEGATIVE 11/19/2016 1503   BILIRUBINUR NEGATIVE 11/19/2016 1503   KETONESUR NEGATIVE 11/19/2016 1503   PROTEINUR NEGATIVE 11/19/2016 1503   NITRITE NEGATIVE 11/19/2016 1503   LEUKOCYTESUR NEGATIVE 11/19/2016 1503    Radiological Exams on Admission: Ct Angio Head W Or Wo Contrast  Result Date: 11/19/2016 CLINICAL DATA:  Left-sided weakness.  Possible stroke. EXAM: CT ANGIOGRAPHY HEAD AND NECK  TECHNIQUE: Multidetector CT imaging of the head and neck was performed using the standard protocol during bolus administration of intravenous contrast. Multiplanar CT image reconstructions and MIPs were obtained to evaluate the vascular anatomy. Carotid stenosis measurements (when applicable) are obtained utilizing NASCET criteria, using the distal internal carotid diameter as the denominator. CONTRAST:  50 cc Isovue 370 intravenous COMPARISON:  Head CT from earlier today FINDINGS: CTA NECK FINDINGS Aortic arch: Mild atheromatous changes. Two vessel branching. No acute finding Right carotid system: Noncalcified plaque on the common carotid and at the bifurcation. No stenosis, ulceration, or dissection. Left carotid system: Noncalcified plaque in the mid common carotid and at the bifurcation without dissection or flow limiting stenosis. Vertebral arteries: No proximal subclavian stenosis. Mild right vertebral artery origin stenosis. Left vertebral artery is mildly dominant. There is intermittent luminal irregularity and narrowing in the V1 and V2 segments compatible with dissection. Normalized appearance by the distal V2 segment. Skeleton: No acute or aggressive finding. Other neck: No incidental mass or adenopathy. Upper chest: Negative Review of the MIP images confirms the above findings CTA HEAD FINDINGS Anterior circulation: Atherosclerotic calcification on the carotid siphons. Mild right supraclinoid ICA narrowing. No major branch occlusion. Hypoplastic right A1 segment with sizable anterior communicating artery. Negative for aneurysm Posterior circulation: Atheromatous wall thickening and calcification on the bilateral V4 segments without flow limiting stenosis. Dominant right AICA and left PICA. The basilar is smooth and widely patent patent PCA bilaterally. Venous sinuses: Patent Anatomic variants: Hypoplastic right A1 segment as above. Delayed phase: No parenchymal enhancement or mass. Review of the MIP  images confirms the above findings IMPRESSION: 1. No emergent large vessel occlusion. 2. 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 3. Intracranial atherosclerosis with mild right supraclinoid ICA narrowing. Electronically Signed   By: Monte Fantasia M.D.   On: 11/19/2016 15:04   Ct Angio Neck W Or Wo Contrast  Result Date: 11/19/2016 CLINICAL DATA:  Left-sided weakness.  Possible stroke. EXAM: CT ANGIOGRAPHY HEAD AND NECK TECHNIQUE: Multidetector  CT imaging of the head and neck was performed using the standard protocol during bolus administration of intravenous contrast. Multiplanar CT image reconstructions and MIPs were obtained to evaluate the vascular anatomy. Carotid stenosis measurements (when applicable) are obtained utilizing NASCET criteria, using the distal internal carotid diameter as the denominator. CONTRAST:  50 cc Isovue 370 intravenous COMPARISON:  Head CT from earlier today FINDINGS: CTA NECK FINDINGS Aortic arch: Mild atheromatous changes. Two vessel branching. No acute finding Right carotid system: Noncalcified plaque on the common carotid and at the bifurcation. No stenosis, ulceration, or dissection. Left carotid system: Noncalcified plaque in the mid common carotid and at the bifurcation without dissection or flow limiting stenosis. Vertebral arteries: No proximal subclavian stenosis. Mild right vertebral artery origin stenosis. Left vertebral artery is mildly dominant. There is intermittent luminal irregularity and narrowing in the V1 and V2 segments compatible with dissection. Normalized appearance by the distal V2 segment. Skeleton: No acute or aggressive finding. Other neck: No incidental mass or adenopathy. Upper chest: Negative Review of the MIP images confirms the above findings CTA HEAD FINDINGS Anterior circulation: Atherosclerotic calcification on the carotid siphons. Mild right supraclinoid ICA narrowing. No major branch  occlusion. Hypoplastic right A1 segment with sizable anterior communicating artery. Negative for aneurysm Posterior circulation: Atheromatous wall thickening and calcification on the bilateral V4 segments without flow limiting stenosis. Dominant right AICA and left PICA. The basilar is smooth and widely patent patent PCA bilaterally. Venous sinuses: Patent Anatomic variants: Hypoplastic right A1 segment as above. Delayed phase: No parenchymal enhancement or mass. Review of the MIP images confirms the above findings IMPRESSION: 1. No emergent large vessel occlusion. 2. 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 3. Intracranial atherosclerosis with mild right supraclinoid ICA narrowing. Electronically Signed   By: Monte Fantasia M.D.   On: 11/19/2016 15:04   Ct Head Code Stroke W/o Cm  Result Date: 11/19/2016 CLINICAL DATA:  Code stroke. New onset right upper extremity weakness 45 minutes ago. EXAM: CT HEAD WITHOUT CONTRAST TECHNIQUE: Contiguous axial images were obtained from the base of the skull through the vertex without intravenous contrast. COMPARISON:  None. FINDINGS: Brain: No acute infarct, hemorrhage, or mass lesion is present. The ventricles are of normal size. No significant extraaxial fluid collection is present. No significant white matter disease is present. The basal ganglia and insular cortex is intact. No focal cortical lesions are present. Vascular: Atherosclerotic calcifications are present within the cavernous internal carotid arteries bilaterally. Calcifications are also present at the dural margin of the vertebral arteries. No hyperdense vessel is present. Skull: Calvarium is intact. Sinuses/Orbits: Mild mucosal thickening is present in the left maxillary sinus and ethmoid air cells. There is some mucosal thickening in the inferior left frontal sinus. No fluid levels are present. The mastoid air cells are clear. ASPECTS [facility] Stroke  Program Early CT Score) - Ganglionic level infarction (caudate, lentiform nuclei, internal capsule, insula, M1-M3 cortex): 7/7 - Supraganglionic infarction (M4-M6 cortex): 3/3 Total score (0-10 with 10 being normal): 10/10 IMPRESSION: 1. Normal CT appearance the brain. 2. ASPECTS is 10/10 These results were called by telephone at the time of interpretation on 11/19/2016 at 1:38 pm to Dr. Leonel Ramsay, who verbally acknowledged these results. Electronically Signed   By: San Morelle M.D.   On: 11/19/2016 13:38    My interpretation of Electrocardiogram: Sinus rhythm at 75 beats from that. Normal axis. Intervals are normal. No concerning ST or T-wave changes. No Q  waves.   Problem List  Principal Problem:   Right arm numbness Active Problems:   Diabetes type 2, uncontrolled (HCC)   Essential hypertension   Assessment: This is a 65 year old Caucasian male with a past medical history of diabetes, hypertension, hypercholesterolemia, who presented with right arm weakness. Concern is for a small stroke. His symptoms, however, have resolved. CT angiogram does show findings of dissection in the left V1 and proximal V2 segments without flow limiting stenosis. Patient was not given TPA, as his symptoms quickly improved and were mild.  Plan: #1 Right arm weakness with findings of V1 and V2 dissection without flow limiting stenosis: Symptoms concerning for small stroke. Findings of dissection as mentioned above. Discussed with Dr. Leonel Ramsay with neurology. He recommends aspirin. Patient does take aspirin 81 mg at home. May have to be switched over to alternative antiplatelet agent. But we will defer this to the stroke team tomorrow. Stroke workup to be completed and to include MRI brain, echocardiogram. Lipid panel to be checked. HbA1c to be checked. PT, OT, speech evaluation. Swallow screen.  #2 Type 2 diabetes: Patient takes metformin at home, which will be held as he has been given contrast. SSI.  CBGs to be monitored. Check HbA1c.  #3 history of essential hypertension: Hold his ramipril for now. Monitor blood pressures closely.  #4 history of hyperlipidemia: Continue with his home medication. Check lipid panel in morning.  DVT Prophylaxis: Lovenox Code Status: Full code Family Communication: Discussed with patient  Disposition Plan: Admit to neuro telemetry. Anticipate patient will be able to return home at the time of discharge.  Consults called: Neurology  Admission status: Observation   Further management decisions will depend on results of further testing and patient's response to treatment.   [facility]  [facility] Pager [phone]  If 7PM-7AM, please contact night-coverage [email or web address] Password [email or web address]  11/19/2016, 5:56 PM

## 2016-11-19 NOTE — Consult Note (Addendum)
Neurology Consultation Reason for Consult: Right arm weakness Referring Physician: Laneta Simmers, D  CC: Right arm weakness  History is obtained from:patient, son   HPI: Alex Bryant is a 65 y.o. male who was in his normal state of health while driving earlier today when he had sudden onset right arm weakness, numbness and dyscoordination. No problems with the face or leg.   He was able to oppose each finger in succession with his thumb fairly rapidly and had 4+/5 strength. He did have some dysmetria.   I discussed with him that this did not appear to be a disabling deficit and my typical recommendation is not to proceed with tPA in this setting.   LKW: 12:45 pm tpa given?: no, mild symptoms   ROS: A 14 point ROS was performed and is negative except as noted in the HPI.   Past Medical History:  Diagnosis Date  . Diabetes mellitus    AODM  . Gout   . Hyperlipidemia   . Tick bite 99991111   Erlichiosis;Eulis Canner MD, ID     Family History  Problem Relation Age of Onset  . Heart attack Father 33  . Hypertension Mother   . Stroke Mother     X80  . Diabetes Sister     "pre Diabetes"  . Cancer Neg Hx   . Colon cancer Neg Hx   . Rectal cancer Neg Hx   . Stomach cancer Neg Hx      Social History:  reports that he quit smoking about 35 years ago. He has never used smokeless tobacco. He reports that he drinks about 3.6 oz of alcohol per week . He reports that he does not use drugs.   Exam: Current vital signs: BP 181/94 (BP Location: Left Arm)   Pulse 62   Resp 16   Ht 6' 0.05" (1.83 m)   Wt 100.2 kg (220 lb 14.4 oz)   SpO2 100%   BMI 29.92 kg/m  Vital signs in last 24 hours: Pulse Rate:  [62] 62 (12/04 1323) Resp:  [16] 16 (12/04 1323) BP: (181)/(94) 181/94 (12/04 1323) SpO2:  [100 %] 100 % (12/04 1323) Weight:  [100.2 kg (220 lb 14.4 oz)] 100.2 kg (220 lb 14.4 oz) (12/04 1324)   Physical Exam  Constitutional: Appears well-developed and well-nourished.  Psych: Affect  appropriate to situation Eyes: No scleral injection HENT: No OP obstrucion Head: Normocephalic.  Cardiovascular: Normal rate and regular rhythm.  Respiratory: Effort normal GI: Soft.  No distension. There is no tenderness.  Skin: WDI  Neuro: Mental Status: Patient is awake, alert, oriented to person, place, month, year, and situation. Patient is able to give a clear and coherent history. No signs of aphasia or neglect Cranial Nerves: II: Visual Fields are full. Pupils are equal, round, and reactive to light.   III,IV, VI: EOMI without ptosis or diploplia.  V: Facial sensation is symmetric to temperature VII: Facial movement is symmetric.  VIII: hearing is intact to voice X: Uvula elevates symmetrically XI: Shoulder shrug is symmetric. XII: tongue is midline without atrophy or fasciculations.  Motor: Tone is normal. Bulk is normal. 5/5 strength was present on the left, as well as the right leg. The right arm has 4/5 strength with dysmetria, but relatively preserved opposition of the fingers in sequence Sensory: Sensation is mildly diminished to pin in the right arm.  Cerebellar: He does have some ataxia of the right arm, intact in right leg, intac on left side.  Ext: Good right arm   I have reviewed labs in epic and the results pertinent to this consultation are: Chem 8 unremarkable  I have reviewed the images obtained:CT head - negative.   Impression: 66 yo M with hypertension and DM with acute onset painless right arm weakness, numbness and dysmetria. I strongly suspect small subcortical infarct.   Recommendations: 1. HgbA1c, fasting lipid panel 2. MRI of the brain without contrast 3. Frequent neuro checks 4. Echocardiogram 5. CT angio head and neck.  6. Prophylactic therapy-Antiplatelet med: Aspirin - dose 325mg  PO or 300mg  PR 7. Risk factor modification 8. Telemetry monitoring 9. PT consult, OT consult, Speech consult 10. please page stroke NP  Or  PA  Or MD  M-F  from 8am -4 pm starting 12/5 as this patient will be followed by the stroke team at this point.   You can look them up on [email or web address]      Roland Rack, MD [facility] [phone]  If 7pm- 7am, please page neurology on call as listed in [place].

## 2016-11-19 NOTE — ED Notes (Signed)
To mri 

## 2016-11-19 NOTE — ED Notes (Signed)
Spoke with Alex Bryant, Service Response and placed dinner meal of a grilled chicken sandwich with lettuce, tomato and mayo on the side, sweet potato wedges, collard greens and cherry crisp for dessert with an unsweetened tea

## 2016-11-19 NOTE — ED Notes (Signed)
Pt returned from mri going to rm 5c rm 7

## 2016-11-19 NOTE — ED Notes (Signed)
Admitting MD at bedside.

## 2016-11-19 NOTE — ED Provider Notes (Signed)
[facility] DEPT Provider Note   CSN: [id number] Arrival date & time: 11/19/16  1310     History   Chief Complaint Chief Complaint  Patient presents with  . Code Stroke    HPI Alex Bryant is a 65 y.o. male.  HPI Patient is a 65 year old male past medical history of hyperlipidemia, diabetes who presents with right arm numbness and weakness. Patient reports he was driving when he experienced sudden onset of weakness in his right upper extremity at 12:45 PM. He felt numbness and difficulty with coordination in his right arm. Denies headache, speech changes, lightheadedness, nausea or vomiting.  Past Medical History:  Diagnosis Date  . Diabetes mellitus    AODM  . Gout   . Hyperlipidemia   . Tick bite [id number]   Erlichiosis;Eulis Canner MD, ID    Patient Active Problem List   Diagnosis Date Noted  . Right arm numbness 11/19/2016  . Cough 02/29/2016  . Plantar fasciitis of right foot 02/17/2014  . Lumbosacral spondylosis without myelopathy 05/25/2013  . Essential hypertension 01/24/2011  . MEDIAL MENISCUS TEAR, RIGHT 07/06/2010  . Diabetes type 2, uncontrolled ([place]) 01/06/2009  . URTICARIA 01/06/2009  . Tubular adenoma of colon 08/26/2008  . HYPERLIPIDEMIA 02/24/2008  . History of gout 11/14/2007  . Sleep disorder 09/08/2007  . 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 COLONOSCOPY    . COLONOSCOPY W/ POLYPECTOMY  2010   Dr Olevia Perches  . ESI  2014   @L5 -S1 X3   . FEMORAL HERNIA REPAIR     age 30  . fx right face     plating & pinning of maxilla  . lumbosacral fusion  10/17/2013   L5-S1 ; [place]  . POLYPECTOMY  2015   Dr Olevia Perches       Home Medications    Prior to Admission medications   Medication Sig Start Date End Date Taking? Authorizing Provider  aspirin 81 MG tablet Take 81 mg by mouth daily.     Yes Historical Provider, MD  clonazePAM (KLONOPIN) 0.5 MG tablet TAKE ONE-HALF  TO 1 TABLET BY MOUTH EVERY NIGHT AT BEDTIME AS NEEDED--- needs appt for furthers refills Patient taking differently: Take 0.25-0.5 mg by mouth at bedtime as needed (for sleep).  11/16/16  Yes Binnie Rail, MD  colchicine 0.6 MG tablet Take 0.6 mg by mouth See admin instructions. Once a day beginning at onset of gout symptoms (as needed)   Yes Historical Provider, MD  glucose blood (FREESTYLE LITE) test strip 1 each by Other route 2 (two) times daily. Use to check blood sugars twice a day. Dx E11.9 09/26/16  Yes Binnie Rail, MD  glucose blood test strip 1 each by Other route as needed. Use as instructed    Yes Historical Provider, MD  Lancets (FREESTYLE) lancets 1 each by Other route 2 (two) times daily. Use to help check blood sugar twice a day. Dx E11.9 09/26/16  Yes Binnie Rail, MD  metFORMIN (GLUCOPHAGE-XR) 500 MG 24 hr tablet TAKE 1 TABLET BY MOUTH TWICE DAILY WITH 2 LARGEST MEALS Patient taking differently: Take 500 mg by mouth two times a day with largest meals 05/15/16  Yes Binnie Rail, MD  Omega-3 Fatty Acids (FISH OIL) 1000 MG CAPS Take 1 capsule by mouth daily.    Yes Historical Provider, MD  ramipril (ALTACE) 5 MG capsule TAKE 1 CAPSULE(5 MG) BY MOUTH DAILY  11/13/16  Yes Binnie Rail, MD  simvastatin (ZOCOR) 20 MG tablet TAKE 1 TABLET BY MOUTH DAILY Patient taking differently: Take 20 mg by mouth once a day 01/24/16  Yes Golden Circle, FNP  HYDROcodone-homatropine [facility]) 5-1.5 MG/5ML syrup Take 5 mLs by mouth every 6 (six) hours as needed for cough. Patient not taking: Reported on 11/19/2016 02/29/16   Biagio Borg, MD  levofloxacin (LEVAQUIN) 250 MG tablet Take 1 tablet (250 mg total) by mouth daily. Patient not taking: Reported on 11/19/2016 02/29/16   Biagio Borg, MD  traZODone (DESYREL) 50 MG tablet Take 1-2 tablets (50-100 mg total) by mouth at bedtime as needed for sleep. Patient not taking: Reported on 11/19/2016 12/30/15   Binnie Rail, MD    Family History Family  History  Problem Relation Age of Onset  . Heart attack Father 94  . Hypertension Mother   . Stroke Mother     X75  . Diabetes Sister     "pre Diabetes"  . Cancer Neg Hx   . Colon cancer Neg Hx   . Rectal cancer Neg Hx   . Stomach cancer Neg Hx     Social History Social History  Substance Use Topics  . Smoking status: Former Smoker    Quit date: 12/17/1980  . Smokeless tobacco: Never Used     Comment: smoked 1966- 1982, up to < 5 cigarettes / day  . Alcohol use 3.6 oz/week    6 Shots of liquor per week     Comment:  socially     Allergies   Oxycodone   Review of Systems Review of Systems  Constitutional: Negative for chills and fever.  HENT: Negative for ear pain and sore throat.   Eyes: Negative for pain and visual disturbance.  Respiratory: Negative for cough and shortness of breath.   Cardiovascular: Negative for chest pain and palpitations.  Gastrointestinal: Negative for abdominal pain and vomiting.  Genitourinary: Negative for dysuria and hematuria.  Musculoskeletal: Negative for arthralgias and back pain.  Skin: Negative for color change and rash.  Neurological: Positive for weakness and numbness. Negative for seizures and syncope.  All other systems reviewed and are negative.    Physical Exam Updated Vital Signs BP 153/85   Pulse 73   Temp 97.8 F (36.6 C) (Oral)   Resp 23   Ht 6' 0.05" (1.83 m)   Wt 100.2 kg   SpO2 98%   BMI 29.92 kg/m   Physical Exam  Constitutional: He is oriented to person, place, and time. He appears well-developed and well-nourished.  HENT:  Head: Normocephalic and atraumatic.  Eyes: Conjunctivae are normal.  Neck: Neck supple.  Cardiovascular: Normal rate and regular rhythm.   No murmur heard. Pulmonary/Chest: Effort normal and breath sounds normal. No respiratory distress.  Abdominal: Soft. There is no tenderness.  Musculoskeletal: He exhibits no edema.  4+/5 strength with flexion/extension of RUE. 5/5 strength of  LUE. Sensation intact to light touch throughout.   Neurological: He is alert and oriented to person, place, and time. No cranial nerve deficit or sensory deficit. He exhibits normal muscle tone. Coordination normal.  Skin: Skin is warm and dry.  Psychiatric: He has a normal mood and affect.  Nursing note and vitals reviewed.    ED Treatments / Results  Labs (all labs ordered are listed, but only abnormal results are displayed) Labs Reviewed  DIFFERENTIAL - Abnormal; Notable for the following:       Result Value  Eosinophils Absolute 0.8 (*)    All other components within normal limits  COMPREHENSIVE METABOLIC PANEL - Abnormal; Notable for the following:    Glucose, Bld 191 (*)    All other components within normal limits  URINALYSIS, ROUTINE W REFLEX MICROSCOPIC (NOT AT [facility]) - Abnormal; Notable for the following:    Glucose, UA 100 (*)    All other components within normal limits  CBG MONITORING, ED - Abnormal; Notable for the following:    Glucose-Capillary 173 (*)    All other components within normal limits  I-STAT CHEM 8, ED - Abnormal; Notable for the following:    Glucose, Bld 189 (*)    All other components within normal limits  ETHANOL  PROTIME-INR  APTT  CBC  RAPID URINE DRUG SCREEN, HOSP PERFORMED  I-STAT TROPOININ, ED    EKG  EKG Interpretation  Date/Time:  Monday November 19 2016 13:50:13 EST Ventricular Rate:  75 PR Interval:    QRS Duration: 95 QT Interval:  374 QTC Calculation: 418 R Axis:   21 Text Interpretation:  Sinus rhythm Prolonged PR interval No significant change since last tracing Confirmed by KNOTT MD, DANIEL [phone]) on 11/19/2016 3:57:38 PM       Radiology Ct Angio Head W Or Wo Contrast  Result Date: 11/19/2016 CLINICAL DATA:  Left-sided weakness.  Possible stroke. EXAM: CT ANGIOGRAPHY HEAD AND NECK TECHNIQUE: Multidetector CT imaging of the head and neck was performed using the standard protocol during bolus administration of  intravenous contrast. Multiplanar CT image reconstructions and MIPs were obtained to evaluate the vascular anatomy. Carotid stenosis measurements (when applicable) are obtained utilizing NASCET criteria, using the distal internal carotid diameter as the denominator. CONTRAST:  50 cc Isovue 370 intravenous COMPARISON:  Head CT from earlier today FINDINGS: CTA NECK FINDINGS Aortic arch: Mild atheromatous changes. Two vessel branching. No acute finding Right carotid system: Noncalcified plaque on the common carotid and at the bifurcation. No stenosis, ulceration, or dissection. Left carotid system: Noncalcified plaque in the mid common carotid and at the bifurcation without dissection or flow limiting stenosis. Vertebral arteries: No proximal subclavian stenosis. Mild right vertebral artery origin stenosis. Left vertebral artery is mildly dominant. There is intermittent luminal irregularity and narrowing in the V1 and V2 segments compatible with dissection. Normalized appearance by the distal V2 segment. Skeleton: No acute or aggressive finding. Other neck: No incidental mass or adenopathy. Upper chest: Negative Review of the MIP images confirms the above findings CTA HEAD FINDINGS Anterior circulation: Atherosclerotic calcification on the carotid siphons. Mild right supraclinoid ICA narrowing. No major branch occlusion. Hypoplastic right A1 segment with sizable anterior communicating artery. Negative for aneurysm Posterior circulation: Atheromatous wall thickening and calcification on the bilateral V4 segments without flow limiting stenosis. Dominant right AICA and left PICA. The basilar is smooth and widely patent patent PCA bilaterally. Venous sinuses: Patent Anatomic variants: Hypoplastic right A1 segment as above. Delayed phase: No parenchymal enhancement or mass. Review of the MIP images confirms the above findings IMPRESSION: 1. No emergent large vessel occlusion. 2. 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 3. Intracranial atherosclerosis with mild right supraclinoid ICA narrowing. Electronically Signed   By: Monte Fantasia M.D.   On: 11/19/2016 15:04   Ct Angio Neck W Or Wo Contrast  Result Date: 11/19/2016 CLINICAL DATA:  Left-sided weakness.  Possible stroke. EXAM: CT ANGIOGRAPHY HEAD AND NECK TECHNIQUE: Multidetector CT imaging of the head and neck was performed  using the standard protocol during bolus administration of intravenous contrast. Multiplanar CT image reconstructions and MIPs were obtained to evaluate the vascular anatomy. Carotid stenosis measurements (when applicable) are obtained utilizing NASCET criteria, using the distal internal carotid diameter as the denominator. CONTRAST:  50 cc Isovue 370 intravenous COMPARISON:  Head CT from earlier today FINDINGS: CTA NECK FINDINGS Aortic arch: Mild atheromatous changes. Two vessel branching. No acute finding Right carotid system: Noncalcified plaque on the common carotid and at the bifurcation. No stenosis, ulceration, or dissection. Left carotid system: Noncalcified plaque in the mid common carotid and at the bifurcation without dissection or flow limiting stenosis. Vertebral arteries: No proximal subclavian stenosis. Mild right vertebral artery origin stenosis. Left vertebral artery is mildly dominant. There is intermittent luminal irregularity and narrowing in the V1 and V2 segments compatible with dissection. Normalized appearance by the distal V2 segment. Skeleton: No acute or aggressive finding. Other neck: No incidental mass or adenopathy. Upper chest: Negative Review of the MIP images confirms the above findings CTA HEAD FINDINGS Anterior circulation: Atherosclerotic calcification on the carotid siphons. Mild right supraclinoid ICA narrowing. No major branch occlusion. Hypoplastic right A1 segment with sizable anterior communicating artery. Negative for aneurysm Posterior circulation:  Atheromatous wall thickening and calcification on the bilateral V4 segments without flow limiting stenosis. Dominant right AICA and left PICA. The basilar is smooth and widely patent patent PCA bilaterally. Venous sinuses: Patent Anatomic variants: Hypoplastic right A1 segment as above. Delayed phase: No parenchymal enhancement or mass. Review of the MIP images confirms the above findings IMPRESSION: 1. No emergent large vessel occlusion. 2. 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 3. Intracranial atherosclerosis with mild right supraclinoid ICA narrowing. Electronically Signed   By: Monte Fantasia M.D.   On: 11/19/2016 15:04   Ct Head Code Stroke W/o Cm  Result Date: 11/19/2016 CLINICAL DATA:  Code stroke. New onset right upper extremity weakness 45 minutes ago. EXAM: CT HEAD WITHOUT CONTRAST TECHNIQUE: Contiguous axial images were obtained from the base of the skull through the vertex without intravenous contrast. COMPARISON:  None. FINDINGS: Brain: No acute infarct, hemorrhage, or mass lesion is present. The ventricles are of normal size. No significant extraaxial fluid collection is present. No significant white matter disease is present. The basal ganglia and insular cortex is intact. No focal cortical lesions are present. Vascular: Atherosclerotic calcifications are present within the cavernous internal carotid arteries bilaterally. Calcifications are also present at the dural margin of the vertebral arteries. No hyperdense vessel is present. Skull: Calvarium is intact. Sinuses/Orbits: Mild mucosal thickening is present in the left maxillary sinus and ethmoid air cells. There is some mucosal thickening in the inferior left frontal sinus. No fluid levels are present. The mastoid air cells are clear. ASPECTS [facility] Stroke Program Early CT Score) - Ganglionic level infarction (caudate, lentiform nuclei, internal capsule, insula, M1-M3 cortex): 7/7 -  Supraganglionic infarction (M4-M6 cortex): 3/3 Total score (0-10 with 10 being normal): 10/10 IMPRESSION: 1. Normal CT appearance the brain. 2. ASPECTS is 10/10 These results were called by telephone at the time of interpretation on 11/19/2016 at 1:38 pm to Dr. Leonel Ramsay, who verbally acknowledged these results. Electronically Signed   By: San Morelle M.D.   On: 11/19/2016 13:38    Procedures Procedures (including critical care time)  Medications Ordered in ED Medications  iopamidol (ISOVUE-370) 76 % injection (50 mLs  Contrast Given 11/19/16 1432)     Initial Impression / Assessment  and Plan / ED Course  I have reviewed the triage vital signs and the nursing notes.  Pertinent labs & imaging results that were available during my care of the patient were reviewed by me and considered in my medical decision making (see chart for details).  Clinical Course    Patient is a 65 year old male past medical history presents as a code stroke from triage. Patient was rapidly transferred to [place]. CT head negative for acute bleed. On reevaluation following CT scan patient reports symptoms have greatly improved although he is not quite back to normal. He has mild weakness of the right upper extremity compared with left. No other focal deficits identified on my exam. Neurology evaluated patient on arrival and determined that patient is not currently a candidate for TPA as he is improving and his symptoms are mild. Advised to obtain CTA head/neck and observation through TPA window in the ED for recurrence of symptoms. If symptoms continue to resolve or remain mild, recommend admission for further stroke management. CTA shows no emergent arterial occlusion. Patient has a dissection of the left V1 and proximal V2 segments without flow-limiting stenosis. Patient was observed in the emergency department and did not have worsening of symptoms. Hospitalist consulted for admission of patient for further  stroke work up.  Patient seen and discussed with Dr. Laneta Simmers, ED attending  Final Clinical Impressions(s) / ED Diagnoses   Final diagnoses:  CVA (cerebral vascular accident) [facility])  CVA (cerebral vascular accident) [facility])    New Prescriptions New Prescriptions   No medications on file     Gibson Ramp, MD 11/19/16 1729    Leo Grosser, MD 11/19/16 [phone]

## 2016-11-19 NOTE — ED Triage Notes (Signed)
Pt states at 1245 was driving and began experiencing R arm numbness and loss of function of that R arm.  Denies headache.  States some dizziness.

## 2016-11-19 NOTE — ED Notes (Signed)
Patient transported to CT 

## 2016-11-20 ENCOUNTER — Observation Stay (HOSPITAL_BASED_OUTPATIENT_CLINIC_OR_DEPARTMENT_OTHER): Payer: PPO

## 2016-11-20 ENCOUNTER — Observation Stay (HOSPITAL_COMMUNITY): Payer: PPO

## 2016-11-20 DIAGNOSIS — Z79899 Other long term (current) drug therapy: Secondary | ICD-10-CM | POA: Diagnosis not present

## 2016-11-20 DIAGNOSIS — R202 Paresthesia of skin: Secondary | ICD-10-CM | POA: Diagnosis not present

## 2016-11-20 DIAGNOSIS — I63412 Cerebral infarction due to embolism of left middle cerebral artery: Secondary | ICD-10-CM

## 2016-11-20 DIAGNOSIS — R278 Other lack of coordination: Secondary | ICD-10-CM | POA: Diagnosis not present

## 2016-11-20 DIAGNOSIS — I63413 Cerebral infarction due to embolism of bilateral middle cerebral arteries: Secondary | ICD-10-CM

## 2016-11-20 DIAGNOSIS — Z7982 Long term (current) use of aspirin: Secondary | ICD-10-CM | POA: Diagnosis not present

## 2016-11-20 DIAGNOSIS — I6340 Cerebral infarction due to embolism of unspecified cerebral artery: Secondary | ICD-10-CM | POA: Diagnosis not present

## 2016-11-20 DIAGNOSIS — I6789 Other cerebrovascular disease: Secondary | ICD-10-CM

## 2016-11-20 DIAGNOSIS — G8191 Hemiplegia, unspecified affecting right dominant side: Secondary | ICD-10-CM | POA: Diagnosis not present

## 2016-11-20 DIAGNOSIS — I7774 Dissection of vertebral artery: Secondary | ICD-10-CM | POA: Diagnosis not present

## 2016-11-20 DIAGNOSIS — E785 Hyperlipidemia, unspecified: Secondary | ICD-10-CM | POA: Diagnosis not present

## 2016-11-20 DIAGNOSIS — Z8673 Personal history of transient ischemic attack (TIA), and cerebral infarction without residual deficits: Secondary | ICD-10-CM | POA: Diagnosis present

## 2016-11-20 DIAGNOSIS — Z833 Family history of diabetes mellitus: Secondary | ICD-10-CM | POA: Diagnosis not present

## 2016-11-20 DIAGNOSIS — Z823 Family history of stroke: Secondary | ICD-10-CM | POA: Diagnosis not present

## 2016-11-20 DIAGNOSIS — E1159 Type 2 diabetes mellitus with other circulatory complications: Secondary | ICD-10-CM | POA: Diagnosis not present

## 2016-11-20 DIAGNOSIS — I639 Cerebral infarction, unspecified: Secondary | ICD-10-CM | POA: Diagnosis not present

## 2016-11-20 DIAGNOSIS — E1165 Type 2 diabetes mellitus with hyperglycemia: Secondary | ICD-10-CM | POA: Diagnosis not present

## 2016-11-20 DIAGNOSIS — Z87891 Personal history of nicotine dependence: Secondary | ICD-10-CM | POA: Diagnosis not present

## 2016-11-20 DIAGNOSIS — M109 Gout, unspecified: Secondary | ICD-10-CM | POA: Diagnosis not present

## 2016-11-20 DIAGNOSIS — Z8249 Family history of ischemic heart disease and other diseases of the circulatory system: Secondary | ICD-10-CM | POA: Diagnosis not present

## 2016-11-20 DIAGNOSIS — I10 Essential (primary) hypertension: Secondary | ICD-10-CM | POA: Diagnosis not present

## 2016-11-20 DIAGNOSIS — Z7984 Long term (current) use of oral hypoglycemic drugs: Secondary | ICD-10-CM | POA: Diagnosis not present

## 2016-11-20 DIAGNOSIS — Z981 Arthrodesis status: Secondary | ICD-10-CM | POA: Diagnosis not present

## 2016-11-20 DIAGNOSIS — Z885 Allergy status to narcotic agent status: Secondary | ICD-10-CM | POA: Diagnosis not present

## 2016-11-20 DIAGNOSIS — I6330 Cerebral infarction due to thrombosis of unspecified cerebral artery: Secondary | ICD-10-CM | POA: Insufficient documentation

## 2016-11-20 LAB — CBC
HCT: 40 % (ref 39.0–52.0)
HEMOGLOBIN: 14.5 g/dL (ref 13.0–17.0)
MCH: 30.8 pg (ref 26.0–34.0)
MCHC: 36.3 g/dL — ABNORMAL HIGH (ref 30.0–36.0)
MCV: 84.9 fL (ref 78.0–100.0)
PLATELETS: 145 10*3/uL — AB (ref 150–400)
RBC: 4.71 MIL/uL (ref 4.22–5.81)
RDW: 12.9 % (ref 11.5–15.5)
WBC: 5.9 10*3/uL (ref 4.0–10.5)

## 2016-11-20 LAB — ECHOCARDIOGRAM COMPLETE
CHL CUP MV DEC (S): 338
E/e' ratio: 7.41
EWDT: 338 ms
FS: 29 % (ref 28–44)
Height: 72.047 in
IV/PV OW: 0.99
LA ID, A-P, ES: 46 mm
LA vol A4C: 43.3 ml
LA vol index: 22.1 mL/m2
LA vol: 49 mL
LADIAMINDEX: 2.07 cm/m2
LDCA: 6.16 cm2
LEFT ATRIUM END SYS DIAM: 46 mm
LV E/e' medial: 7.41
LV PW d: 12.3 mm — AB (ref 0.6–1.1)
LV TDI E'LATERAL: 8.38
LV TDI E'MEDIAL: 6.09
LVEEAVG: 7.41
LVELAT: 8.38 cm/s
LVOTD: 28 mm
MV pk A vel: 79.3 m/s
MV pk E vel: 62.1 m/s
RV LATERAL S' VELOCITY: 13 cm/s
RV TAPSE: 23.4 mm
Weight: 3534.41 oz

## 2016-11-20 LAB — COMPREHENSIVE METABOLIC PANEL
ALT: 19 U/L (ref 17–63)
AST: 18 U/L (ref 15–41)
Albumin: 3.5 g/dL (ref 3.5–5.0)
Alkaline Phosphatase: 60 U/L (ref 38–126)
Anion gap: 6 (ref 5–15)
BILIRUBIN TOTAL: 0.8 mg/dL (ref 0.3–1.2)
BUN: 17 mg/dL (ref 6–20)
CHLORIDE: 104 mmol/L (ref 101–111)
CO2: 26 mmol/L (ref 22–32)
CREATININE: 1.2 mg/dL (ref 0.61–1.24)
Calcium: 9.2 mg/dL (ref 8.9–10.3)
Glucose, Bld: 221 mg/dL — ABNORMAL HIGH (ref 65–99)
POTASSIUM: 3.8 mmol/L (ref 3.5–5.1)
Sodium: 136 mmol/L (ref 135–145)
TOTAL PROTEIN: 6.2 g/dL — AB (ref 6.5–8.1)

## 2016-11-20 LAB — GLUCOSE, CAPILLARY
GLUCOSE-CAPILLARY: 127 mg/dL — AB (ref 65–99)
Glucose-Capillary: 148 mg/dL — ABNORMAL HIGH (ref 65–99)
Glucose-Capillary: 188 mg/dL — ABNORMAL HIGH (ref 65–99)
Glucose-Capillary: 218 mg/dL — ABNORMAL HIGH (ref 65–99)

## 2016-11-20 LAB — LIPID PANEL
CHOL/HDL RATIO: 4.5 ratio
CHOLESTEROL: 147 mg/dL (ref 0–200)
HDL: 33 mg/dL — ABNORMAL LOW (ref >40)
LDL Cholesterol: 62 mg/dL (ref 0–99)
TRIGLYCERIDES: 259 mg/dL — AB (ref <150)
VLDL: 52 mg/dL — AB (ref 0–40)

## 2016-11-20 MED ORDER — SIMVASTATIN 20 MG PO TABS: 20.0000 mg | ORAL_TABLET | Freq: Every day | ORAL | Status: DC

## 2016-11-20 MED ORDER — ATORVASTATIN CALCIUM 80 MG PO TABS
80.0000 mg | ORAL_TABLET | Freq: Once | ORAL | Status: AC
  Administered 2016-11-20: 80 mg via ORAL
  Filled 2016-11-20: qty 1

## 2016-11-20 MED ORDER — CLOPIDOGREL BISULFATE 75 MG PO TABS: 75.0000 mg | ORAL_TABLET | Freq: Every day | ORAL | Status: DC

## 2016-11-20 MED ORDER — CLOPIDOGREL BISULFATE 75 MG PO TABS
300.0000 mg | ORAL_TABLET | Freq: Once | ORAL | Status: AC
  Administered 2016-11-20: 300 mg via ORAL
  Filled 2016-11-20: qty 4

## 2016-11-20 MED ORDER — ATORVASTATIN CALCIUM 80 MG PO TABS: 80.0000 mg | ORAL_TABLET | Freq: Once | ORAL | Status: DC

## 2016-11-20 NOTE — Progress Notes (Signed)
PROGRESS NOTE        PATIENT DETAILS Name: Alex Bryant Age: 65 y.o. Sex: male Date of Birth: 07/16/1951 Admit Date: 11/19/2016 Admitting Physician Karmen Bongo, MD OP:[id number] J Burns, MD  Brief Narrative: Patient is a 65 y.o. male past medical history of type 2 diabetes, hypertension, dyslipidemia admitted with transient right arm weakness, MRI brain positive for a punctate left paramedian infarct  Subjective: No right-hand weakness.  No chest pain or shortness of breath.  Assessment/Plan: Acute left paramedian infarct: No focal deficits-right arm weakness has resolved. Continue aspirin 325 mg. LDL at goal at 62, A1c pending. CT angiogram negative for any significant stenosis, await 2-D echocardiogram. Neurology discussed with patient, recommendations are to proceed with TEE and loop recorder-neurology will discuss with cardiology.  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  Dyslipidemia: Continue statin  Type 2 diabetes: CBGs stable with SSI. Await A1c. Continue to hold oral hypoglycemic agents.  Hypertension: Lower permissive hypertension, resume ramipril over the next few days.  DVT Prophylaxis: Prophylactic Lovenox   Code Status: Full code  Family Communication: Spouse at bedside  Disposition Plan: Remain inpatient-home when work up complete  Antimicrobial agents: None  Procedures: None  CONSULTS:  neurology  Time spent: 25- minutes-Greater than 50% of this time was spent in counseling, explanation of diagnosis, planning of further management, and coordination of care.  MEDICATIONS: Anti-infectives    None      Scheduled Meds: . aspirin  300 mg Rectal Daily   Or  . aspirin  325 mg Oral Daily  . enoxaparin (LOVENOX) injection  40 mg Subcutaneous Q24H  . insulin aspart  0-9 Units Subcutaneous TID WC  . simvastatin  20 mg Oral Daily   Continuous Infusions: PRN Meds:.acetaminophen  **OR** acetaminophen, clonazePAM   PHYSICAL EXAM: Vital signs: Vitals:   11/20/16 0530 11/20/16 0730 11/20/16 0930 11/20/16 1300  BP: 138/71 (!) 140/51 131/65 (!) 161/87  Pulse: (!) 57 (!) 57 73 73  Resp: 20  18 20   Temp:  98.1 F (36.7 C)    TempSrc:  Oral    SpO2: 98% 97% 98% 98%  Weight:      Height:       Filed Weights   11/19/16 1324  Weight: 100.2 kg (220 lb 14.4 oz)   Body mass index is 29.92 kg/m.   General appearance :Awake, alert, not in any distress. Speech Clear. Not toxic Looking Eyes:, pupils equally reactive to light and accomodation,no scleral icterus.Pink conjunctiva HEENT: Atraumatic and Normocephalic Neck: supple, no JVD. No cervical lymphadenopathy. No thyromegaly Resp:Good air entry bilaterally, no added sounds  CVS: S1 S2 regular, no murmurs.  GI: Bowel sounds present, Non tender and not distended with no gaurding, rigidity or rebound.No organomegaly Extremities: B/L Lower Ext shows no edema, both legs are warm to touch Neurology:  speech clear,Non focal, sensation is grossly intact. Psychiatric: Normal judgment and insight. Alert and oriented x 3. Normal mood. Musculoskeletal:No digital cyanosis Skin:No Rash, warm and dry Wounds:N/A  I have personally reviewed following labs and imaging studies  LABORATORY DATA: CBC:  Recent Labs Lab 11/19/16 1315 11/19/16 1335 11/20/16 0259  WBC 7.5  --  5.9  NEUTROABS 3.7  --   --   HGB 14.7 13.9 14.5  HCT 40.9 41.0 40.0  MCV 85.6  --  84.9  PLT 207  --  145*    Basic Metabolic Panel:  Recent Labs Lab 11/19/16 1315 11/19/16 1335 11/20/16 0259  NA 138 138 136  K 4.3 4.6 3.8  CL 105 103 104  CO2 25  --  26  GLUCOSE 191* 189* 221*  BUN 14 17 17   CREATININE 1.12 1.20 1.20  CALCIUM 9.4  --  9.2    GFR: Estimated Creatinine Clearance: 73.3 mL/min (by C-G formula based on SCr of 1.2 mg/dL).  Liver Function Tests:  Recent Labs Lab 11/19/16 1315 11/20/16 0259  AST 18 18  ALT 21 19    ALKPHOS 66 60  BILITOT 1.1 0.8  PROT 6.9 6.2*  ALBUMIN 4.1 3.5   No results for input(s): LIPASE, AMYLASE in the last 168 hours. No results for input(s): AMMONIA in the last 168 hours.  Coagulation Profile:  Recent Labs Lab 11/19/16 1315  INR 1.07    Cardiac Enzymes: No results for input(s): CKTOTAL, CKMB, CKMBINDEX, TROPONINI in the last 168 hours.  BNP (last 3 results) No results for input(s): PROBNP in the last 8760 hours.  HbA1C: No results for input(s): HGBA1C in the last 72 hours.  CBG:  Recent Labs Lab 11/19/16 1319 11/19/16 2220 11/20/16 0607 11/20/16 1119  GLUCAP 173* 210* 148* 188*    Lipid Profile:  Recent Labs  11/20/16 0259  CHOL 147  HDL 33*  LDLCALC 62  TRIG 259*  CHOLHDL 4.5    Thyroid Function Tests: No results for input(s): TSH, T4TOTAL, FREET4, T3FREE, THYROIDAB in the last 72 hours.  Anemia Panel: No results for input(s): VITAMINB12, FOLATE, FERRITIN, TIBC, IRON, RETICCTPCT in the last 72 hours.  Urine analysis:    Component Value Date/Time   COLORURINE YELLOW 11/19/2016 1503   APPEARANCEUR CLEAR 11/19/2016 1503   LABSPEC 1.013 11/19/2016 1503   PHURINE 6.0 11/19/2016 1503   GLUCOSEU 100 (A) 11/19/2016 1503   HGBUR NEGATIVE 11/19/2016 1503   BILIRUBINUR NEGATIVE 11/19/2016 1503   KETONESUR NEGATIVE 11/19/2016 1503   PROTEINUR NEGATIVE 11/19/2016 1503   NITRITE NEGATIVE 11/19/2016 1503   LEUKOCYTESUR NEGATIVE 11/19/2016 1503    Sepsis Labs: Lactic Acid, Venous No results found for: LATICACIDVEN  MICROBIOLOGY: No results found for this or any previous visit (from the past 240 hour(s)).  RADIOLOGY STUDIES/RESULTS: Ct Angio Head W Or Wo Contrast  Addendum Date: 11/20/2016   ADDENDUM REPORT: 11/20/2016 11:39 ADDENDUM: Dr. Erlinda Hong reports patient has no neck pain to suggest acute vertebral dissection. The described proximal left vertebral narrowings could be atheromatous instead of a limited segment dissection. Electronically  Signed   By: Monte Fantasia M.D.   On: 11/20/2016 11:39   Result Date: 11/20/2016 CLINICAL DATA:  Left-sided weakness.  Possible stroke. EXAM: CT ANGIOGRAPHY HEAD AND NECK TECHNIQUE: Multidetector CT imaging of the head and neck was performed using the standard protocol during bolus administration of intravenous contrast. Multiplanar CT image reconstructions and MIPs were obtained to evaluate the vascular anatomy. Carotid stenosis measurements (when applicable) are obtained utilizing NASCET criteria, using the distal internal carotid diameter as the denominator. CONTRAST:  50 cc Isovue 370 intravenous COMPARISON:  Head CT from earlier today FINDINGS: CTA NECK FINDINGS Aortic arch: Mild atheromatous changes. Two vessel branching. No acute finding Right carotid system: Noncalcified plaque on the common carotid and at the bifurcation. No stenosis, ulceration, or dissection. Left carotid system: Noncalcified plaque in the mid common carotid and at the bifurcation without dissection or flow limiting stenosis. Vertebral arteries: No proximal subclavian stenosis. Mild right vertebral artery origin  stenosis. Left vertebral artery is mildly dominant. There is intermittent luminal irregularity and narrowing in the V1 and V2 segments compatible with dissection. Normalized appearance by the distal V2 segment. Skeleton: No acute or aggressive finding. Other neck: No incidental mass or adenopathy. Upper chest: Negative Review of the MIP images confirms the above findings CTA HEAD FINDINGS Anterior circulation: Atherosclerotic calcification on the carotid siphons. Mild right supraclinoid ICA narrowing. No major branch occlusion. Hypoplastic right A1 segment with sizable anterior communicating artery. Negative for aneurysm Posterior circulation: Atheromatous wall thickening and calcification on the bilateral V4 segments without flow limiting stenosis. Dominant right AICA and left PICA. The basilar is smooth and widely patent  patent PCA bilaterally. Venous sinuses: Patent Anatomic variants: Hypoplastic right A1 segment as above. Delayed phase: No parenchymal enhancement or mass. Review of the MIP images confirms the above findings IMPRESSION: 1. No emergent large vessel occlusion. 2. 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 3. Intracranial atherosclerosis with mild right supraclinoid ICA narrowing. Electronically Signed: By: Monte Fantasia M.D. On: 11/19/2016 15:04   Ct Angio Neck W Or Wo Contrast  Addendum Date: 11/20/2016   ADDENDUM REPORT: 11/20/2016 11:39 ADDENDUM: Dr. Erlinda Hong reports patient has no neck pain to suggest acute vertebral dissection. The described proximal left vertebral narrowings could be atheromatous instead of a limited segment dissection. Electronically Signed   By: Monte Fantasia M.D.   On: 11/20/2016 11:39   Result Date: 11/20/2016 CLINICAL DATA:  Left-sided weakness.  Possible stroke. EXAM: CT ANGIOGRAPHY HEAD AND NECK TECHNIQUE: Multidetector CT imaging of the head and neck was performed using the standard protocol during bolus administration of intravenous contrast. Multiplanar CT image reconstructions and MIPs were obtained to evaluate the vascular anatomy. Carotid stenosis measurements (when applicable) are obtained utilizing NASCET criteria, using the distal internal carotid diameter as the denominator. CONTRAST:  50 cc Isovue 370 intravenous COMPARISON:  Head CT from earlier today FINDINGS: CTA NECK FINDINGS Aortic arch: Mild atheromatous changes. Two vessel branching. No acute finding Right carotid system: Noncalcified plaque on the common carotid and at the bifurcation. No stenosis, ulceration, or dissection. Left carotid system: Noncalcified plaque in the mid common carotid and at the bifurcation without dissection or flow limiting stenosis. Vertebral arteries: No proximal subclavian stenosis. Mild right vertebral artery origin stenosis. Left  vertebral artery is mildly dominant. There is intermittent luminal irregularity and narrowing in the V1 and V2 segments compatible with dissection. Normalized appearance by the distal V2 segment. Skeleton: No acute or aggressive finding. Other neck: No incidental mass or adenopathy. Upper chest: Negative Review of the MIP images confirms the above findings CTA HEAD FINDINGS Anterior circulation: Atherosclerotic calcification on the carotid siphons. Mild right supraclinoid ICA narrowing. No major branch occlusion. Hypoplastic right A1 segment with sizable anterior communicating artery. Negative for aneurysm Posterior circulation: Atheromatous wall thickening and calcification on the bilateral V4 segments without flow limiting stenosis. Dominant right AICA and left PICA. The basilar is smooth and widely patent patent PCA bilaterally. Venous sinuses: Patent Anatomic variants: Hypoplastic right A1 segment as above. Delayed phase: No parenchymal enhancement or mass. Review of the MIP images confirms the above findings IMPRESSION: 1. No emergent large vessel occlusion. 2. 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 3. Intracranial atherosclerosis with mild right supraclinoid ICA narrowing. Electronically Signed: By: Monte Fantasia M.D. On: 11/19/2016 15:04   Mr Brain Wo Contrast  Result Date: 11/19/2016  CLINICAL DATA:  65 y/o  M; right arm weakness. EXAM: MRI HEAD WITHOUT CONTRAST TECHNIQUE: Multiplanar, multiecho pulse sequences of the brain and surrounding structures were obtained without intravenous contrast. COMPARISON:  11/19/2016 CT head and CT angiogram head and neck. FINDINGS: Brain: Punctate focus of diffusion restriction within the left paramedian parietal lobe (series 3, image 38 and series 4, image 9). No additional diffusion signal abnormality. No abnormal susceptibility hypointensity to indicate intracranial hemorrhage. Mild chronic microvascular  ischemic changes. Mild brain parenchymal volume loss. No focal mass effect. No extra-axial collection. No effacement of basilar cisterns. No herniation. Vascular: Normal flow voids. Skull and upper cervical spine: Normal marrow signal. Sinuses/Orbits: Diffuse paranasal sinus disease greatest in left maxillary and left sphenoid sinuses. Partial left mastoid effusion. Orbits are unremarkable Other: None. IMPRESSION: 1. Punctate acute/early subacute infarct in left paramedian parietal lobe. No intracranial hemorrhage. 2. Mild chronic microvascular ischemic changes and parenchymal volume loss. 3. Mild to moderate paranasal sinus disease. These results will be called to the ordering clinician or representative by the Radiologist Assistant, and communication documented in the PACS or zVision Dashboard. Electronically Signed   By: Kristine Garbe M.D.   On: 11/19/2016 21:19   Ct Head Code Stroke W/o Cm  Result Date: 11/19/2016 CLINICAL DATA:  Code stroke. New onset right upper extremity weakness 45 minutes ago. EXAM: CT HEAD WITHOUT CONTRAST TECHNIQUE: Contiguous axial images were obtained from the base of the skull through the vertex without intravenous contrast. COMPARISON:  None. FINDINGS: Brain: No acute infarct, hemorrhage, or mass lesion is present. The ventricles are of normal size. No significant extraaxial fluid collection is present. No significant white matter disease is present. The basal ganglia and insular cortex is intact. No focal cortical lesions are present. Vascular: Atherosclerotic calcifications are present within the cavernous internal carotid arteries bilaterally. Calcifications are also present at the dural margin of the vertebral arteries. No hyperdense vessel is present. Skull: Calvarium is intact. Sinuses/Orbits: Mild mucosal thickening is present in the left maxillary sinus and ethmoid air cells. There is some mucosal thickening in the inferior left frontal sinus. No fluid levels  are present. The mastoid air cells are clear. ASPECTS [facility] Stroke Program Early CT Score) - Ganglionic level infarction (caudate, lentiform nuclei, internal capsule, insula, M1-M3 cortex): 7/7 - Supraganglionic infarction (M4-M6 cortex): 3/3 Total score (0-10 with 10 being normal): 10/10 IMPRESSION: 1. Normal CT appearance the brain. 2. ASPECTS is 10/10 These results were called by telephone at the time of interpretation on 11/19/2016 at 1:38 pm to Dr. Leonel Ramsay, who verbally acknowledged these results. Electronically Signed   By: San Morelle M.D.   On: 11/19/2016 13:38     LOS: 0 days   Oren Binet, MD  [facility] Pager:[phone]  If 7PM-7AM, please contact night-coverage [email or web address] Password [email or web address] 11/20/2016, 1:56 PM

## 2016-11-20 NOTE — Progress Notes (Signed)
Speech Language Pathology  Patient Details Name: Alex Bryant MRN: [id number] DOB: 19-Apr-1951 Today's Date: 11/20/2016 Time:  -       Pt's speech-language-cognition screened. Pt/family without complaints in these areas during or after episode of weakness. No intervention needed.     Orbie Pyo [email or web address].[employer] [phone]

## 2016-11-20 NOTE — Progress Notes (Signed)
  Echocardiogram 2D Echocardiogram has been performed.  Alex Bryant 11/20/2016, 12:23 PM

## 2016-11-20 NOTE — Care Management Note (Signed)
Case Management Note  Patient Details  Name: Alex Bryant MRN: [id number] Date of Birth: 02/09/1951  Subjective/Objective:   Pt admitted with CVA. He is from home with his spouse.                 Action/Plan: Awaiting PT/OT recommendations. CM following for d/c needs.   Expected Discharge Date:                  Expected Discharge Plan:     In-House Referral:     Discharge planning Services     Post Acute Care Choice:    Choice offered to:     DME Arranged:    DME Agency:     HH Arranged:    HH Agency:     Status of Service:  In process, will continue to follow  If discussed at Long Length of Stay Meetings, dates discussed:    Additional Comments:  Pollie Friar, RN 11/20/2016, 10:41 AM

## 2016-11-20 NOTE — Plan of Care (Signed)
Called by nurse that patient experienced transient right arm numbness and weakness, now resolved.  Went to see patient, he stated that he was lying in bed and suddenly had right arm numbness and weakness lasting about 5 minutes. Currently still feels subtle numbness at right elbow, but strengths are back to normal. Denies any speech difficulty, facial weakness, or leg involvement. No shaking, jerking or LOC. BP is stable. Will load with Plavix 300 mg and Lipitor 80 mg. Keep bedrest. Will do limited MRI for evaluation.  Rosalin Hawking, MD PhD Stroke Neurology 11/20/2016 5:47 PM

## 2016-11-20 NOTE — Progress Notes (Signed)
STROKE TEAM PROGRESS NOTE   HISTORY OF PRESENT ILLNESS (per record) Alex Bryant is a 65 y.o. male who was in his normal state of health while driving earlier today when he had sudden onset right arm weakness, numbness and dyscoordination. No problems with the face or leg. He was able to oppose each finger in succession with his thumb fairly rapidly and had 4+/5 strength. He did have some dysmetria. Dr. Leonel Ramsay discussed with him that this did not appear to be a disabling deficit and his typical recommendation is not to proceed with tPA in this setting. He was last known well 11/19/2016 at 12:45 pm. He was admitted for further evaluation and treatment.   SUBJECTIVE (INTERVAL HISTORY) Y with at bedside. Patient stated that yesterday he had one episode of right arm numbness and tingling lasting for several hours and resolved. MRI showed left MCA/ACA punctate infarct, concerning for embolic. Patient stated that his blood sugar and blood pressure are well controlled at home. He denies smoking, or heart palpitation.   OBJECTIVE Temp:  [97.8 F (36.6 C)-98.2 F (36.8 C)] 98.1 F (36.7 C) (12/05 0730) Pulse Rate:  [57-129] 73 (12/05 0930) Cardiac Rhythm: Heart block (12/04 2245) Resp:  [11-26] 18 (12/05 0930) BP: (125-181)/(51-106) 131/65 (12/05 0930) SpO2:  [95 %-100 %] 98 % (12/05 0930) Weight:  [100.2 kg (220 lb 14.4 oz)] 100.2 kg (220 lb 14.4 oz) (12/04 1324)  CBC:  Recent Labs Lab 11/19/16 1315 11/19/16 1335 11/20/16 0259  WBC 7.5  --  5.9  NEUTROABS 3.7  --   --   HGB 14.7 13.9 14.5  HCT 40.9 41.0 40.0  MCV 85.6  --  84.9  PLT 207  --  145*    Basic Metabolic Panel:  Recent Labs Lab 11/19/16 1315 11/19/16 1335 11/20/16 0259  NA 138 138 136  K 4.3 4.6 3.8  CL 105 103 104  CO2 25  --  26  GLUCOSE 191* 189* 221*  BUN 14 17 17   CREATININE 1.12 1.20 1.20  CALCIUM 9.4  --  9.2    Lipid Panel:    Component Value Date/Time   CHOL 147 11/20/2016 0259   CHOL 153  05/25/2015 0924   TRIG 259 (H) 11/20/2016 0259   TRIG 188 (H) 05/25/2015 0924   HDL 33 (L) 11/20/2016 0259   HDL 38 (L) 05/25/2015 0924   CHOLHDL 4.5 11/20/2016 0259   VLDL 52 (H) 11/20/2016 0259   LDLCALC 62 11/20/2016 0259   LDLCALC 77 05/25/2015 0924   HgbA1c:  Lab Results  Component Value Date   HGBA1C 7.7 (H) 12/30/2015   Urine Drug Screen:    Component Value Date/Time   LABOPIA NONE DETECTED 11/19/2016 1503   COCAINSCRNUR NONE DETECTED 11/19/2016 1503   LABBENZ NONE DETECTED 11/19/2016 1503   AMPHETMU NONE DETECTED 11/19/2016 1503   THCU NONE DETECTED 11/19/2016 1503   LABBARB NONE DETECTED 11/19/2016 1503      IMAGING I have personally reviewed the radiological images below and agree with the radiology interpretations.  Ct Angio Head W Or Wo Contrast Ct Angio Neck W Or Wo Contrast Date: 11/19/2016   11/20/2016 11:39  ADDENDUM: Dr. Erlinda Hong reports patient has no neck pain to suggest acute vertebral dissection. The described proximal left vertebral narrowings could be atheromatous instead of a limited segment dissection.  11/19/2016 15:04  1. No emergent large vessel occlusion. 2. 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 3. Intracranial  atherosclerosis with mild right supraclinoid ICA narrowing.   Mr Brain Wo Contrast 11/19/2016 1. Punctate acute/early subacute infarct in left paramedian parietal lobe. No intracranial hemorrhage. 2. Mild chronic microvascular ischemic changes and parenchymal volume loss. 3. Mild to moderate paranasal sinus disease.   Ct Head Code Stroke W/o Cm 11/19/2016 1. Normal CT appearance the brain. 2. ASPECTS is 10/10   2-D echocardiogram - Left ventricle: The cavity size was normal. There was mild   concentric hypertrophy. Systolic function was normal. The   estimated ejection fraction was in the range of 60% to 65%. Wall   motion was normal; there were no regional wall motion   abnormalities.  There was an increased relative contribution of   atrial contraction to ventricular filling. Doppler parameters are   consistent with abnormal left ventricular relaxation (grade 1   diastolic dysfunction). - Aorta: Aortic root dimension: 39 mm (ED). - Aortic root: The aortic root was mildly dilated.   PHYSICAL EXAM  Temp:  [97.9 F (36.6 C)-98.2 F (36.8 C)] 97.9 F (36.6 C) (12/05 1532) Pulse Rate:  [57-129] 67 (12/05 1532) Resp:  [12-24] 20 (12/05 1300) BP: (131-178)/(51-99) 146/65 (12/05 1532) SpO2:  [96 %-100 %] 96 % (12/05 1532)  General - Well nourished, well developed, in no apparent distress.  Ophthalmologic - Sharp disc margins OU.   Cardiovascular - Regular rate and rhythm.  Mental Status -  Level of arousal and orientation to time, place, and person were intact. Language including expression, naming, repetition, comprehension was assessed and found intact. Fund of Knowledge was assessed and was intact.  Cranial Nerves II - XII - II - Visual field intact OU. III, IV, VI - Extraocular movements intact. V - Facial sensation intact bilaterally. VII - Facial movement intact bilaterally. VIII - Hearing & vestibular intact bilaterally. X - Palate elevates symmetrically. XI - Chin turning & shoulder shrug intact bilaterally. XII - Tongue protrusion intact.  Motor Strength - The patient's strength was normal in all extremities and pronator drift was absent.  Bulk was normal and fasciculations were absent.   Motor Tone - Muscle tone was assessed at the neck and appendages and was normal.  Reflexes - The patient's reflexes were 1+ in all extremities and he had no pathological reflexes.  Sensory - Light touch, temperature/pinprick were assessed and were symmetrical.    Coordination - The patient had normal movements in the hands with no ataxia or dysmetria.  Tremor was absent.  Gait and Station - The patient's transfers, posture, gait, station, and turns were observed  as normal.   ASSESSMENT/PLAN [name] is a 65 y.o. male with history of diabetes, hypertension, hypercholesterolemia and gout presenting with right arm weakness, numbness and dyscoordination. He did not receive IV t-PA due to mild deficit.   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 No LV dissection, LV atheromatous  2D Echo  EF 60-65%  LDL 62  HgbA1c pending  TEE to look for embolic source. Arranged with [facility] for tomorrow.  If positive for PFO (patent foramen ovale), check bilateral lower extremity venous dopplers to rule out DVT as possible source of stroke. (I have made patient NPO after midnight tonight).  If TEE negative, a [place] electrophysiologist will  consult and consider placement of an implantable loop recorder to evaluate for atrial fibrillation as etiology of stroke. This has been explained to patient/family by Dr. Erlinda Hong and they are agreeable.   Lovenox 40 mg sq daily for VTE prophylaxis  Diet heart healthy/carb modified Room service appropriate? Yes; Fluid consistency: Thin  aspirin 81 mg daily prior to admission, now on aspirin 325 mg daily. Continue aspirin for now.  Patient counseled to be compliant with his antithrombotic medications  Ongoing aggressive stroke risk factor management  Therapy recommendations:  No therapy needs  Disposition:  Return home  Hypertension  Stable  Permissive hypertension (OK if < 220/120) but gradually normalize in 5-7 days  Long-term BP goal normotensive  Hyperlipidemia  Home meds:  zocor 20, resumed in hospital  LDL 62, goal < 70  Continue statin at discharge  Diabetes  HgbA1c pending, goal < 7.0  Not fully  controlled  CBG monitoring  SSI  Other Stroke Risk Factors  Advanced age  ETOH use, advised to drink no more than 2 drink(s) a day  Overweight, Body mass index is 29.92 kg/m., recommend weight loss, diet and exercise as appropriate   Family hx stroke (mother)  Hospital day # 0  Rosalin Hawking, MD PhD Stroke Neurology 11/20/2016 5:43 PM    To contact Stroke Continuity provider, please refer to [email or web address] After hours, contact General Neurology

## 2016-11-20 NOTE — Progress Notes (Signed)
OT Cancellation Note  Patient Details Name: Alex Bryant MRN: [id number] DOB: 1951/09/02   Cancelled Treatment:    Reason Eval/Treat Not Completed: OT screened, no needs identified, will sign off. All symptoms resolved per pt.  Malka So 11/20/2016, 10:40 AM

## 2016-11-20 NOTE — Progress Notes (Signed)
    [facility] has been requested to perform a transesophageal echocardiogram on this patient for stroke.  After careful review of history and examination, the risks and benefits of transesophageal echocardiogram have been explained including risks of esophageal damage, perforation (1:10,000 risk), bleeding, pharyngeal hematoma as well as other potential complications associated with conscious sedation including aspiration, arrhythmia, respiratory failure and death. Alternatives to treatment were discussed, questions were answered. Patient is willing to proceed. This is scheduled for tomorrow at 11am with Dr. Aundra Dubin.  The patient is knows Dr. Addison Lank personally - he states Dr. Lia Foyer also came and spoke with the patient about the procedure. Per discussion with Trish (coordinator), EP will also see patient tomorrow to review loop.  Charlie Pitter, PA-C 11/20/2016 4:13 PM

## 2016-11-20 NOTE — Progress Notes (Signed)
Patient is complaining of new onset of right arm numbness. Vitals within normal. MD paged to notify.

## 2016-11-20 NOTE — Progress Notes (Signed)
PT Cancellation Note  Patient Details Name: AH DOOD MRN: [id number] DOB: 1951/06/27   Cancelled Treatment:    Reason Eval/Treat Not Completed: PT screened, no needs identified, will sign off (pt reports RUE weakness has completely resolved and he has no other deficits. PT signing off. )   Philomena Doheny 11/20/2016, 9:49 AM [phone]

## 2016-11-21 ENCOUNTER — Inpatient Hospital Stay (HOSPITAL_COMMUNITY): Payer: PPO

## 2016-11-21 ENCOUNTER — Encounter (HOSPITAL_COMMUNITY)
Admission: EM | Disposition: A | Discharge status: Home / Self Care | Payer: Self-pay | Source: Home / Self Care | Attending: Internal Medicine

## 2016-11-21 ENCOUNTER — Ambulatory Visit (HOSPITAL_COMMUNITY): Admit: 2016-11-21 | Payer: PPO | Admitting: Internal Medicine

## 2016-11-21 ENCOUNTER — Encounter (HOSPITAL_COMMUNITY): Payer: Self-pay | Admitting: *Deleted

## 2016-11-21 DIAGNOSIS — E1159 Type 2 diabetes mellitus with other circulatory complications: Secondary | ICD-10-CM

## 2016-11-21 DIAGNOSIS — I639 Cerebral infarction, unspecified: Secondary | ICD-10-CM

## 2016-11-21 HISTORY — PX: TEE WITHOUT CARDIOVERSION: SHX5443

## 2016-11-21 HISTORY — PX: EP IMPLANTABLE DEVICE: SHX172B

## 2016-11-21 LAB — GLUCOSE, CAPILLARY
GLUCOSE-CAPILLARY: 194 mg/dL — AB (ref 65–99)
GLUCOSE-CAPILLARY: 135 mg/dL — AB (ref 65–99)

## 2016-11-21 LAB — HEMOGLOBIN A1C
HEMOGLOBIN A1C: 7.8 % — AB (ref 4.8–5.6)
MEAN PLASMA GLUCOSE: 177 mg/dL

## 2016-11-21 SURGERY — LOOP RECORDER INSERTION
Anesthesia: LOCAL

## 2016-11-21 SURGERY — ECHOCARDIOGRAM, TRANSESOPHAGEAL
Anesthesia: Moderate Sedation

## 2016-11-21 MED ORDER — LIDOCAINE HCL (PF) 1 % IJ SOLN
INTRAMUSCULAR | Status: DC | PRN
  Administered 2016-11-21: 7 mL

## 2016-11-21 MED ORDER — FENTANYL CITRATE (PF) 100 MCG/2ML IJ SOLN
INTRAMUSCULAR | Status: DC | PRN
  Administered 2016-11-21: 25 ug via INTRAVENOUS
  Administered 2016-11-21: 50 ug via INTRAVENOUS

## 2016-11-21 MED ORDER — MIDAZOLAM HCL 10 MG/2ML IJ SOLN
INTRAMUSCULAR | Status: DC | PRN
  Administered 2016-11-21: 3 mg via INTRAVENOUS
  Administered 2016-11-21: 1 mg via INTRAVENOUS

## 2016-11-21 MED ORDER — BUTAMBEN-TETRACAINE-BENZOCAINE 2-2-14 % EX AERO
INHALATION_SPRAY | CUTANEOUS | Status: DC | PRN
  Administered 2016-11-21: 2 via TOPICAL

## 2016-11-21 MED ORDER — LIDOCAINE-EPINEPHRINE 1 %-1:100000 IJ SOLN
INTRAMUSCULAR | Status: AC
  Filled 2016-11-21: qty 1

## 2016-11-21 MED ORDER — SODIUM CHLORIDE 0.9 % IV SOLN
INTRAVENOUS | Status: DC
  Administered 2016-11-21: 1000 mL via INTRAVENOUS

## 2016-11-21 MED ORDER — FENTANYL CITRATE (PF) 100 MCG/2ML IJ SOLN
INTRAMUSCULAR | Status: AC
  Filled 2016-11-21: qty 2

## 2016-11-21 MED ORDER — ASPIRIN 325 MG PO TABS: 325.0000 mg | ORAL_TABLET | Freq: Every day | ORAL | 0 refills | Status: DC

## 2016-11-21 MED ORDER — CLOPIDOGREL BISULFATE 75 MG PO TABS: 75.0000 mg | ORAL_TABLET | Freq: Every day | ORAL | 0 refills | Status: DC

## 2016-11-21 MED ORDER — METFORMIN HCL 1000 MG PO TABS: 1000.0000 mg | ORAL_TABLET | Freq: Two times a day (BID) | ORAL | 0 refills | Status: DC

## 2016-11-21 MED ORDER — MIDAZOLAM HCL 5 MG/ML IJ SOLN
INTRAMUSCULAR | Status: AC
  Filled 2016-11-21: qty 2

## 2016-11-21 SURGICAL SUPPLY — 2 items
LOOP REVEAL LINQSYS (Prosthesis & Implant Heart) ×2 IMPLANT
PACK LOOP INSERTION (CUSTOM PROCEDURE TRAY) ×2 IMPLANT

## 2016-11-21 NOTE — H&P (View-Only) (Signed)
PROGRESS NOTE        PATIENT DETAILS Name: Alex Bryant Age: 65 y.o. Sex: male Date of Birth: October 21, 1951 Admit Date: 11/19/2016 Admitting Physician Karmen Bongo, MD [id number] J Burns, MD  Brief Narrative: Patient is a 65 y.o. male past medical history of type 2 diabetes, hypertension, dyslipidemia admitted with transient right arm weakness, MRI brain positive for a punctate left paramedian infarct  Subjective: No right-hand weakness.  No chest pain or shortness of breath.  Assessment/Plan: Acute left paramedian infarct: No focal deficits-right arm weakness has resolved. Continue aspirin 325 mg. LDL at goal at 62, A1c pending. CT angiogram negative for any significant stenosis, await 2-D echocardiogram. Neurology discussed with patient, recommendations are to proceed with TEE and loop recorder-neurology will discuss with cardiology.  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  Dyslipidemia: Continue statin  Type 2 diabetes: CBGs stable with SSI. Await A1c. Continue to hold oral hypoglycemic agents.  Hypertension: Lower permissive hypertension, resume ramipril over the next few days.  DVT Prophylaxis: Prophylactic Lovenox   Code Status: Full code  Family Communication: Spouse at bedside  Disposition Plan: Remain inpatient-home when work up complete  Antimicrobial agents: None  Procedures: None  CONSULTS:  neurology  Time spent: 25- minutes-Greater than 50% of this time was spent in counseling, explanation of diagnosis, planning of further management, and coordination of care.  MEDICATIONS: Anti-infectives    None      Scheduled Meds: . aspirin  300 mg Rectal Daily   Or  . aspirin  325 mg Oral Daily  . enoxaparin (LOVENOX) injection  40 mg Subcutaneous Q24H  . insulin aspart  0-9 Units Subcutaneous TID WC  . simvastatin  20 mg Oral Daily   Continuous Infusions: PRN Meds:.acetaminophen  **OR** acetaminophen, clonazePAM   PHYSICAL EXAM: Vital signs: Vitals:   11/20/16 0530 11/20/16 0730 11/20/16 0930 11/20/16 1300  BP: 138/71 (!) 140/51 131/65 (!) 161/87  Pulse: (!) 57 (!) 57 73 73  Resp: 20  18 20   Temp:  98.1 F (36.7 C)    TempSrc:  Oral    SpO2: 98% 97% 98% 98%  Weight:      Height:       Filed Weights   11/19/16 1324  Weight: 100.2 kg (220 lb 14.4 oz)   Body mass index is 29.92 kg/m.   General appearance :Awake, alert, not in any distress. Speech Clear. Not toxic Looking Eyes:, pupils equally reactive to light and accomodation,no scleral icterus.Pink conjunctiva HEENT: Atraumatic and Normocephalic Neck: supple, no JVD. No cervical lymphadenopathy. No thyromegaly Resp:Good air entry bilaterally, no added sounds  CVS: S1 S2 regular, no murmurs.  GI: Bowel sounds present, Non tender and not distended with no gaurding, rigidity or rebound.No organomegaly Extremities: B/L Lower Ext shows no edema, both legs are warm to touch Neurology:  speech clear,Non focal, sensation is grossly intact. Psychiatric: Normal judgment and insight. Alert and oriented x 3. Normal mood. Musculoskeletal:No digital cyanosis Skin:No Rash, warm and dry Wounds:N/A  I have personally reviewed following labs and imaging studies  LABORATORY DATA: CBC:  Recent Labs Lab 11/19/16 1315 11/19/16 1335 11/20/16 0259  WBC 7.5  --  5.9  NEUTROABS 3.7  --   --   HGB 14.7 13.9 14.5  HCT 40.9 41.0 40.0  MCV 85.6  --  84.9  PLT 207  --  145*    Basic Metabolic Panel:  Recent Labs Lab 11/19/16 1315 11/19/16 1335 11/20/16 0259  NA 138 138 136  K 4.3 4.6 3.8  CL 105 103 104  CO2 25  --  26  GLUCOSE 191* 189* 221*  BUN 14 17 17   CREATININE 1.12 1.20 1.20  CALCIUM 9.4  --  9.2    GFR: Estimated Creatinine Clearance: 73.3 mL/min (by C-G formula based on SCr of 1.2 mg/dL).  Liver Function Tests:  Recent Labs Lab 11/19/16 1315 11/20/16 0259  AST 18 18  ALT 21 19    ALKPHOS 66 60  BILITOT 1.1 0.8  PROT 6.9 6.2*  ALBUMIN 4.1 3.5   No results for input(s): LIPASE, AMYLASE in the last 168 hours. No results for input(s): AMMONIA in the last 168 hours.  Coagulation Profile:  Recent Labs Lab 11/19/16 1315  INR 1.07    Cardiac Enzymes: No results for input(s): CKTOTAL, CKMB, CKMBINDEX, TROPONINI in the last 168 hours.  BNP (last 3 results) No results for input(s): PROBNP in the last 8760 hours.  HbA1C: No results for input(s): HGBA1C in the last 72 hours.  CBG:  Recent Labs Lab 11/19/16 1319 11/19/16 2220 11/20/16 0607 11/20/16 1119  GLUCAP 173* 210* 148* 188*    Lipid Profile:  Recent Labs  11/20/16 0259  CHOL 147  HDL 33*  LDLCALC 62  TRIG 259*  CHOLHDL 4.5    Thyroid Function Tests: No results for input(s): TSH, T4TOTAL, FREET4, T3FREE, THYROIDAB in the last 72 hours.  Anemia Panel: No results for input(s): VITAMINB12, FOLATE, FERRITIN, TIBC, IRON, RETICCTPCT in the last 72 hours.  Urine analysis:    Component Value Date/Time   COLORURINE YELLOW 11/19/2016 1503   APPEARANCEUR CLEAR 11/19/2016 1503   LABSPEC 1.013 11/19/2016 1503   PHURINE 6.0 11/19/2016 1503   GLUCOSEU 100 (A) 11/19/2016 1503   HGBUR NEGATIVE 11/19/2016 1503   BILIRUBINUR NEGATIVE 11/19/2016 1503   KETONESUR NEGATIVE 11/19/2016 1503   PROTEINUR NEGATIVE 11/19/2016 1503   NITRITE NEGATIVE 11/19/2016 1503   LEUKOCYTESUR NEGATIVE 11/19/2016 1503    Sepsis Labs: Lactic Acid, Venous No results found for: LATICACIDVEN  MICROBIOLOGY: No results found for this or any previous visit (from the past 240 hour(s)).  RADIOLOGY STUDIES/RESULTS: Ct Angio Head W Or Wo Contrast  Addendum Date: 11/20/2016   ADDENDUM REPORT: 11/20/2016 11:39 ADDENDUM: Dr. Erlinda Hong reports patient has no neck pain to suggest acute vertebral dissection. The described proximal left vertebral narrowings could be atheromatous instead of a limited segment dissection. Electronically  Signed   By: Monte Fantasia M.D.   On: 11/20/2016 11:39   Result Date: 11/20/2016 CLINICAL DATA:  Left-sided weakness.  Possible stroke. EXAM: CT ANGIOGRAPHY HEAD AND NECK TECHNIQUE: Multidetector CT imaging of the head and neck was performed using the standard protocol during bolus administration of intravenous contrast. Multiplanar CT image reconstructions and MIPs were obtained to evaluate the vascular anatomy. Carotid stenosis measurements (when applicable) are obtained utilizing NASCET criteria, using the distal internal carotid diameter as the denominator. CONTRAST:  50 cc Isovue 370 intravenous COMPARISON:  Head CT from earlier today FINDINGS: CTA NECK FINDINGS Aortic arch: Mild atheromatous changes. Two vessel branching. No acute finding Right carotid system: Noncalcified plaque on the common carotid and at the bifurcation. No stenosis, ulceration, or dissection. Left carotid system: Noncalcified plaque in the mid common carotid and at the bifurcation without dissection or flow limiting stenosis. Vertebral arteries: No proximal subclavian stenosis. Mild right vertebral artery origin  stenosis. Left vertebral artery is mildly dominant. There is intermittent luminal irregularity and narrowing in the V1 and V2 segments compatible with dissection. Normalized appearance by the distal V2 segment. Skeleton: No acute or aggressive finding. Other neck: No incidental mass or adenopathy. Upper chest: Negative Review of the MIP images confirms the above findings CTA HEAD FINDINGS Anterior circulation: Atherosclerotic calcification on the carotid siphons. Mild right supraclinoid ICA narrowing. No major branch occlusion. Hypoplastic right A1 segment with sizable anterior communicating artery. Negative for aneurysm Posterior circulation: Atheromatous wall thickening and calcification on the bilateral V4 segments without flow limiting stenosis. Dominant right AICA and left PICA. The basilar is smooth and widely patent  patent PCA bilaterally. Venous sinuses: Patent Anatomic variants: Hypoplastic right A1 segment as above. Delayed phase: No parenchymal enhancement or mass. Review of the MIP images confirms the above findings IMPRESSION: 1. No emergent large vessel occlusion. 2. 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 3. Intracranial atherosclerosis with mild right supraclinoid ICA narrowing. Electronically Signed: By: Monte Fantasia M.D. On: 11/19/2016 15:04   Ct Angio Neck W Or Wo Contrast  Addendum Date: 11/20/2016   ADDENDUM REPORT: 11/20/2016 11:39 ADDENDUM: Dr. Erlinda Hong reports patient has no neck pain to suggest acute vertebral dissection. The described proximal left vertebral narrowings could be atheromatous instead of a limited segment dissection. Electronically Signed   By: Monte Fantasia M.D.   On: 11/20/2016 11:39   Result Date: 11/20/2016 CLINICAL DATA:  Left-sided weakness.  Possible stroke. EXAM: CT ANGIOGRAPHY HEAD AND NECK TECHNIQUE: Multidetector CT imaging of the head and neck was performed using the standard protocol during bolus administration of intravenous contrast. Multiplanar CT image reconstructions and MIPs were obtained to evaluate the vascular anatomy. Carotid stenosis measurements (when applicable) are obtained utilizing NASCET criteria, using the distal internal carotid diameter as the denominator. CONTRAST:  50 cc Isovue 370 intravenous COMPARISON:  Head CT from earlier today FINDINGS: CTA NECK FINDINGS Aortic arch: Mild atheromatous changes. Two vessel branching. No acute finding Right carotid system: Noncalcified plaque on the common carotid and at the bifurcation. No stenosis, ulceration, or dissection. Left carotid system: Noncalcified plaque in the mid common carotid and at the bifurcation without dissection or flow limiting stenosis. Vertebral arteries: No proximal subclavian stenosis. Mild right vertebral artery origin stenosis. Left  vertebral artery is mildly dominant. There is intermittent luminal irregularity and narrowing in the V1 and V2 segments compatible with dissection. Normalized appearance by the distal V2 segment. Skeleton: No acute or aggressive finding. Other neck: No incidental mass or adenopathy. Upper chest: Negative Review of the MIP images confirms the above findings CTA HEAD FINDINGS Anterior circulation: Atherosclerotic calcification on the carotid siphons. Mild right supraclinoid ICA narrowing. No major branch occlusion. Hypoplastic right A1 segment with sizable anterior communicating artery. Negative for aneurysm Posterior circulation: Atheromatous wall thickening and calcification on the bilateral V4 segments without flow limiting stenosis. Dominant right AICA and left PICA. The basilar is smooth and widely patent patent PCA bilaterally. Venous sinuses: Patent Anatomic variants: Hypoplastic right A1 segment as above. Delayed phase: No parenchymal enhancement or mass. Review of the MIP images confirms the above findings IMPRESSION: 1. No emergent large vessel occlusion. 2. 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 3. Intracranial atherosclerosis with mild right supraclinoid ICA narrowing. Electronically Signed: By: Monte Fantasia M.D. On: 11/19/2016 15:04   Mr Brain Wo Contrast  Result Date: 11/19/2016  CLINICAL DATA:  65 y/o  M; right arm weakness. EXAM: MRI HEAD WITHOUT CONTRAST TECHNIQUE: Multiplanar, multiecho pulse sequences of the brain and surrounding structures were obtained without intravenous contrast. COMPARISON:  11/19/2016 CT head and CT angiogram head and neck. FINDINGS: Brain: Punctate focus of diffusion restriction within the left paramedian parietal lobe (series 3, image 38 and series 4, image 9). No additional diffusion signal abnormality. No abnormal susceptibility hypointensity to indicate intracranial hemorrhage. Mild chronic microvascular  ischemic changes. Mild brain parenchymal volume loss. No focal mass effect. No extra-axial collection. No effacement of basilar cisterns. No herniation. Vascular: Normal flow voids. Skull and upper cervical spine: Normal marrow signal. Sinuses/Orbits: Diffuse paranasal sinus disease greatest in left maxillary and left sphenoid sinuses. Partial left mastoid effusion. Orbits are unremarkable Other: None. IMPRESSION: 1. Punctate acute/early subacute infarct in left paramedian parietal lobe. No intracranial hemorrhage. 2. Mild chronic microvascular ischemic changes and parenchymal volume loss. 3. Mild to moderate paranasal sinus disease. These results will be called to the ordering clinician or representative by the Radiologist Assistant, and communication documented in the PACS or zVision Dashboard. Electronically Signed   By: Kristine Garbe M.D.   On: 11/19/2016 21:19   Ct Head Code Stroke W/o Cm  Result Date: 11/19/2016 CLINICAL DATA:  Code stroke. New onset right upper extremity weakness 45 minutes ago. EXAM: CT HEAD WITHOUT CONTRAST TECHNIQUE: Contiguous axial images were obtained from the base of the skull through the vertex without intravenous contrast. COMPARISON:  None. FINDINGS: Brain: No acute infarct, hemorrhage, or mass lesion is present. The ventricles are of normal size. No significant extraaxial fluid collection is present. No significant white matter disease is present. The basal ganglia and insular cortex is intact. No focal cortical lesions are present. Vascular: Atherosclerotic calcifications are present within the cavernous internal carotid arteries bilaterally. Calcifications are also present at the dural margin of the vertebral arteries. No hyperdense vessel is present. Skull: Calvarium is intact. Sinuses/Orbits: Mild mucosal thickening is present in the left maxillary sinus and ethmoid air cells. There is some mucosal thickening in the inferior left frontal sinus. No fluid levels  are present. The mastoid air cells are clear. ASPECTS [facility] Stroke Program Early CT Score) - Ganglionic level infarction (caudate, lentiform nuclei, internal capsule, insula, M1-M3 cortex): 7/7 - Supraganglionic infarction (M4-M6 cortex): 3/3 Total score (0-10 with 10 being normal): 10/10 IMPRESSION: 1. Normal CT appearance the brain. 2. ASPECTS is 10/10 These results were called by telephone at the time of interpretation on 11/19/2016 at 1:38 pm to Dr. Leonel Ramsay, who verbally acknowledged these results. Electronically Signed   By: San Morelle M.D.   On: 11/19/2016 13:38     LOS: 0 days   Oren Binet, MD  [facility] Pager:[phone]  If 7PM-7AM, please contact night-coverage [email or web address] Password [email or web address] 11/20/2016, 1:56 PM

## 2016-11-21 NOTE — Discharge Summary (Signed)
PATIENT DETAILS Name: Alex Bryant Age: 65 y.o. Sex: male Date of Birth: 03/05/1951 MRN: [id number]. Admitting Physician: Karmen Bongo, MD [id number] Lorretta Harp, MD  Admit Date: 11/19/2016 Discharge date: 11/21/2016  Recommendations for Outpatient Follow-up:  1. Follow up with PCP in 1-2 weeks 2. Please obtain BMP/CBC in one week 3. Repeat A1C in 3 months 4. ASA/Plavix for 3 weeks, then Plavix alone 5. Ensure follow up with cardiology and Neurology  Admitted From:  Home  Disposition: [place]: No  Equipment/Devices: None  Discharge Condition: Stable  CODE STATUS: FULL CODE  Diet recommendation:  Heart Healthy / Carb Modified  Brief Summary: See H&P, Labs, Consult and Test reports for all details in brief,Patient is a 65 y.o. male past medical history of type 2 diabetes, hypertension, dyslipidemia admitted with transient right arm weakness, MRI brain positive for a punctate left paramedian infarct. Admitted for further evaluation and treatment  Brief Hospital Course: Acute ischemic infarct: presented with transient right arm numbness that has since resolved. Initial MRI Brain showed a small left paramedian infarct. Yesterday he again experienced transient right arm numbness, repeated MRI Brain (limited) showed at least 2 additional punctate foci of acute/ early subacute. He is now suspected of having a embolic CVA. He underwent TEE today that was negative for thrombus, and subsequently had loop recorder implanted. CT angiogram negative for any significant stenosis.Recommendations from Neurology are to d/c home on ASA/Plavix for 3 weeks, and then to just continue Plavix.LDL at goal at 62-plans are to continue statin. A1c 7.8. Note-no focal neurological deficits on exam.   ?Left Vertebral artery dissection: Continue anti-platelets, likely a incidental finding.Per radiology addendum note-this could from a atheromatous narrowing as well and not be a  dissection.  Dyslipidemia: Continue statin-LDL appears to be at goal  Type 2 diabetes: CBGs stable with SSI while inpatient. Since A1C elevated at 7.8, will increase Metformin to 1000 mg BID. Repeat A1C in 3 months, and if still not at goal-then may consider adding a second agent.   Hypertension: Allowed permissive hypertension, resume ramipril on discharge  Procedures/Studies: 12/6>>TEE, Loop recorder  Discharge Diagnoses:  Principal Problem:   Right arm numbness Active Problems:   Diabetes type 2, uncontrolled (HCC)   Essential hypertension   Cerebral thrombosis with cerebral infarction   CVA (cerebral vascular accident) ([place])   Acute CVA (cerebrovascular accident) [facility])   Discharge Instructions:  Activity:  As tolerated with Full fall precautions use walker/cane & assistance as needed  Discharge Instructions    Ambulatory referral to Neurology    Complete by:  As directed    Diet - low sodium heart healthy    Complete by:  As directed    Diet Carb Modified    Complete by:  As directed    Discharge instructions    Complete by:  As directed    Follow with Primary MD  Binnie Rail, MD and Dr Erlinda Hong (Neurology)  Continue Aspirin and Plavix for 3 weeks, after 3 weeks-continue Plavix only  Please get a complete blood count and chemistry panel checked by your Primary MD at your next visit, and again as instructed by your Primary MD.  Get Medicines reviewed and adjusted: Please take all your medications with you for your next visit with your Primary MD  Laboratory/radiological data: Please request your Primary MD to go over all hospital tests and procedure/radiological results at the follow up, please ask your Primary MD to get all Hospital records sent  to his/her office.  In some cases, they will be blood work, cultures and biopsy results pending at the time of your discharge. Please request that your primary care M.D. follows up on these results.  Also Note the  following: If you experience worsening of your admission symptoms, develop shortness of breath, life threatening emergency, suicidal or homicidal thoughts you must seek medical attention immediately by calling 911 or calling your MD immediately  if symptoms less severe.  You must read complete instructions/literature along with all the possible adverse reactions/side effects for all the Medicines you take and that have been prescribed to you. Take any new Medicines after you have completely understood and accpet all the possible adverse reactions/side effects.   Do not drive when taking Pain medications or sleeping medications (Benzodaizepines)  Do not take more than prescribed Pain, Sleep and Anxiety Medications. It is not advisable to combine anxiety,sleep and pain medications without talking with your primary care practitioner  Special Instructions: If you have smoked or chewed Tobacco  in the last 2 yrs please stop smoking, stop any regular Alcohol  and or any Recreational drug use.  Wear Seat belts while driving.  Please note: You were cared for by a hospitalist during your hospital stay. Once you are discharged, your primary care physician will handle any further medical issues. Please note that NO REFILLS for any discharge medications will be authorized once you are discharged, as it is imperative that you return to your primary care physician (or establish a relationship with a primary care physician if you do not have one) for your post hospital discharge needs so that they can reassess your need for medications and monitor your lab values.   Increase activity slowly    Complete by:  As directed        Medication List    STOP taking these medications   metFORMIN 500 MG 24 hr tablet Commonly known as:  GLUCOPHAGE-XR Replaced by:  metFORMIN 1000 MG tablet     TAKE these medications   aspirin 81 MG tablet Take 81 mg by mouth daily.   clonazePAM 0.5 MG tablet Commonly known as:   KLONOPIN TAKE ONE-HALF TO 1 TABLET BY MOUTH EVERY NIGHT AT BEDTIME AS NEEDED--- needs appt for furthers refills What changed:  how much to take  how to take this  when to take this  reasons to take this  additional instructions   clopidogrel 75 MG tablet Commonly known as:  PLAVIX Take 1 tablet (75 mg total) by mouth daily.   colchicine 0.6 MG tablet Take 0.6 mg by mouth See admin instructions. Once a day beginning at onset of gout symptoms (as needed)   Fish Oil 1000 MG Caps Take 1 capsule by mouth daily.   freestyle lancets 1 each by Other route 2 (two) times daily. Use to help check blood sugar twice a day. Dx E11.9   glucose blood test strip 1 each by Other route as needed. Use as instructed   glucose blood test strip Commonly known as:  FREESTYLE LITE 1 each by Other route 2 (two) times daily. Use to check blood sugars twice a day. Dx E11.9   metFORMIN 1000 MG tablet Commonly known as:  GLUCOPHAGE Take 1 tablet (1,000 mg total) by mouth 2 (two) times daily with a meal. Replaces:  metFORMIN 500 MG 24 hr tablet   ramipril 5 MG capsule Commonly known as:  ALTACE TAKE 1 CAPSULE(5 MG) BY MOUTH DAILY   simvastatin 20  MG tablet Commonly known as:  ZOCOR TAKE 1 TABLET BY MOUTH DAILY What changed:  See the new instructions.      Follow-up Information    Thompson Grayer, MD Follow up on 12/05/2016.   Specialty:  Cardiology Why:  2:00PM, wound check nurse visit Contact information: [place] [address] [phone]        Xu,Jindong, MD. Schedule an appointment as soon as possible for a visit in 6 week(s).   Specialty:  Neurology Why:  office will call you for a follow up appointment Contact information: [address] [place] [address] [phone]        Binnie Rail, MD. Schedule an appointment as soon as possible for a visit in 2 week(s).   Specialty:  Internal Medicine Contact information: [place] [address] [phone]          Allergies  Allergen Reactions  . Oxycodone Anxiety    [name], confused, and constipation    Consultations:   cardiology and neurology  Other Procedures/Studies: Ct Angio Head W Or Wo Contrast  Addendum Date: 11/20/2016   ADDENDUM REPORT: 11/20/2016 11:39 ADDENDUM: Dr. Erlinda Hong reports patient has no neck pain to suggest acute vertebral dissection. The described proximal left vertebral narrowings could be atheromatous instead of a limited segment dissection. Electronically Signed   By: Monte Fantasia M.D.   On: 11/20/2016 11:39   Result Date: 11/20/2016 CLINICAL DATA:  Left-sided weakness.  Possible stroke. EXAM: CT ANGIOGRAPHY HEAD AND NECK TECHNIQUE: Multidetector CT imaging of the head and neck was performed using the standard protocol during bolus administration of intravenous contrast. Multiplanar CT image reconstructions and MIPs were obtained to evaluate the vascular anatomy. Carotid stenosis measurements (when applicable) are obtained utilizing NASCET criteria, using the distal internal carotid diameter as the denominator. CONTRAST:  50 cc Isovue 370 intravenous COMPARISON:  Head CT from earlier today FINDINGS: CTA NECK FINDINGS Aortic arch: Mild atheromatous changes. Two vessel branching. No acute finding Right carotid system: Noncalcified plaque on the common carotid and at the bifurcation. No stenosis, ulceration, or dissection. Left carotid system: Noncalcified plaque in the mid common carotid and at the bifurcation without dissection or flow limiting stenosis. Vertebral arteries: No proximal subclavian stenosis. Mild right vertebral artery origin stenosis. Left vertebral artery is mildly dominant. There is intermittent luminal irregularity and narrowing in the V1 and V2 segments compatible with dissection. Normalized appearance by the distal V2 segment. Skeleton: No acute or aggressive finding. Other neck: No incidental mass or adenopathy.  Upper chest: Negative Review of the MIP images confirms the above findings CTA HEAD FINDINGS Anterior circulation: Atherosclerotic calcification on the carotid siphons. Mild right supraclinoid ICA narrowing. No major branch occlusion. Hypoplastic right A1 segment with sizable anterior communicating artery. Negative for aneurysm Posterior circulation: Atheromatous wall thickening and calcification on the bilateral V4 segments without flow limiting stenosis. Dominant right AICA and left PICA. The basilar is smooth and widely patent patent PCA bilaterally. Venous sinuses: Patent Anatomic variants: Hypoplastic right A1 segment as above. Delayed phase: No parenchymal enhancement or mass. Review of the MIP images confirms the above findings IMPRESSION: 1. No emergent large vessel occlusion. 2. 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 3. Intracranial atherosclerosis with mild right supraclinoid ICA narrowing. Electronically Signed: By: Monte Fantasia M.D. On: 11/19/2016 15:04   Ct Angio Neck W Or Wo Contrast  Addendum Date: 11/20/2016  ADDENDUM REPORT: 11/20/2016 11:39 ADDENDUM: Dr. Erlinda Hong reports patient has no neck pain to suggest acute vertebral dissection. The described proximal left vertebral narrowings could be atheromatous instead of a limited segment dissection. Electronically Signed   By: Monte Fantasia M.D.   On: 11/20/2016 11:39   Result Date: 11/20/2016 CLINICAL DATA:  Left-sided weakness.  Possible stroke. EXAM: CT ANGIOGRAPHY HEAD AND NECK TECHNIQUE: Multidetector CT imaging of the head and neck was performed using the standard protocol during bolus administration of intravenous contrast. Multiplanar CT image reconstructions and MIPs were obtained to evaluate the vascular anatomy. Carotid stenosis measurements (when applicable) are obtained utilizing NASCET criteria, using the distal internal carotid diameter as the denominator. CONTRAST:  50 cc  Isovue 370 intravenous COMPARISON:  Head CT from earlier today FINDINGS: CTA NECK FINDINGS Aortic arch: Mild atheromatous changes. Two vessel branching. No acute finding Right carotid system: Noncalcified plaque on the common carotid and at the bifurcation. No stenosis, ulceration, or dissection. Left carotid system: Noncalcified plaque in the mid common carotid and at the bifurcation without dissection or flow limiting stenosis. Vertebral arteries: No proximal subclavian stenosis. Mild right vertebral artery origin stenosis. Left vertebral artery is mildly dominant. There is intermittent luminal irregularity and narrowing in the V1 and V2 segments compatible with dissection. Normalized appearance by the distal V2 segment. Skeleton: No acute or aggressive finding. Other neck: No incidental mass or adenopathy. Upper chest: Negative Review of the MIP images confirms the above findings CTA HEAD FINDINGS Anterior circulation: Atherosclerotic calcification on the carotid siphons. Mild right supraclinoid ICA narrowing. No major branch occlusion. Hypoplastic right A1 segment with sizable anterior communicating artery. Negative for aneurysm Posterior circulation: Atheromatous wall thickening and calcification on the bilateral V4 segments without flow limiting stenosis. Dominant right AICA and left PICA. The basilar is smooth and widely patent patent PCA bilaterally. Venous sinuses: Patent Anatomic variants: Hypoplastic right A1 segment as above. Delayed phase: No parenchymal enhancement or mass. Review of the MIP images confirms the above findings IMPRESSION: 1. No emergent large vessel occlusion. 2. 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 3. Intracranial atherosclerosis with mild right supraclinoid ICA narrowing. Electronically Signed: By: Monte Fantasia M.D. On: 11/19/2016 15:04   Mr Brain Wo Contrast  Result Date: 11/19/2016 CLINICAL DATA:  65 y/o  M; right arm  weakness. EXAM: MRI HEAD WITHOUT CONTRAST TECHNIQUE: Multiplanar, multiecho pulse sequences of the brain and surrounding structures were obtained without intravenous contrast. COMPARISON:  11/19/2016 CT head and CT angiogram head and neck. FINDINGS: Brain: Punctate focus of diffusion restriction within the left paramedian parietal lobe (series 3, image 38 and series 4, image 9). No additional diffusion signal abnormality. No abnormal susceptibility hypointensity to indicate intracranial hemorrhage. Mild chronic microvascular ischemic changes. Mild brain parenchymal volume loss. No focal mass effect. No extra-axial collection. No effacement of basilar cisterns. No herniation. Vascular: Normal flow voids. Skull and upper cervical spine: Normal marrow signal. Sinuses/Orbits: Diffuse paranasal sinus disease greatest in left maxillary and left sphenoid sinuses. Partial left mastoid effusion. Orbits are unremarkable Other: None. IMPRESSION: 1. Punctate acute/early subacute infarct in left paramedian parietal lobe. No intracranial hemorrhage. 2. Mild chronic microvascular ischemic changes and parenchymal volume loss. 3. Mild to moderate paranasal sinus disease. These results will be called to the ordering clinician or representative by the Radiologist Assistant, and communication documented in the PACS or zVision Dashboard. Electronically Signed   By: Kristine Garbe M.D.   On: 11/19/2016  21:19   Mr Brain Limited Wo Contrast  Result Date: 11/20/2016 CLINICAL DATA:  65 y/o M; new transient right arm numbness and weakness, now resolved. EXAM: MRI HEAD WITHOUT CONTRAST TECHNIQUE: Multiplanar, multiecho pulse sequences of the brain and surrounding structures were obtained without intravenous contrast. COMPARISON:  11/19/2016 MRI of the brain. FINDINGS: Brain: Stable persistent left parietal punctate focus of diffusion restriction. At least 2 additional punctate foci of diffusion restriction in the left posterior  frontal centrum semiovale and left high parietal cortex (series 3, image 35 and 43). Probable additional new punctate focus in the right parietal centrum semiovale (series 3, image 35). No abnormal susceptibility hypointensity to indicate intracranial hemorrhage. Mild chronic microvascular ischemic changes. Mild brain parenchymal volume loss. No focal mass effect, extra-axial collection, basilar cistern effacement, or herniation. Normal ventricle size. Vascular: Normal flow voids. Skull and upper cervical spine: Normal marrow signal. Sinuses/Orbits: Stable mild paranasal sinus disease and left-greater-than-right partial mastoid effusions. Orbits are unremarkable Other: None. IMPRESSION: At least 2 additional punctate foci of acute/ early subacute infarction within the left posterior frontal and parietal lobe. Probable additional punctate focus of infarction in the right parietal white matter. Multiple vascular territories suggests embolic etiology. No intracranial hemorrhage. These results will be called to the ordering clinician or representative by the Radiologist Assistant, and communication documented in the PACS or zVision Dashboard. Electronically Signed   By: Kristine Garbe M.D.   On: 11/20/2016 20:01   Ct Head Code Stroke W/o Cm  Result Date: 11/19/2016 CLINICAL DATA:  Code stroke. New onset right upper extremity weakness 45 minutes ago. EXAM: CT HEAD WITHOUT CONTRAST TECHNIQUE: Contiguous axial images were obtained from the base of the skull through the vertex without intravenous contrast. COMPARISON:  None. FINDINGS: Brain: No acute infarct, hemorrhage, or mass lesion is present. The ventricles are of normal size. No significant extraaxial fluid collection is present. No significant white matter disease is present. The basal ganglia and insular cortex is intact. No focal cortical lesions are present. Vascular: Atherosclerotic calcifications are present within the cavernous internal carotid  arteries bilaterally. Calcifications are also present at the dural margin of the vertebral arteries. No hyperdense vessel is present. Skull: Calvarium is intact. Sinuses/Orbits: Mild mucosal thickening is present in the left maxillary sinus and ethmoid air cells. There is some mucosal thickening in the inferior left frontal sinus. No fluid levels are present. The mastoid air cells are clear. ASPECTS [facility] Stroke Program Early CT Score) - Ganglionic level infarction (caudate, lentiform nuclei, internal capsule, insula, M1-M3 cortex): 7/7 - Supraganglionic infarction (M4-M6 cortex): 3/3 Total score (0-10 with 10 being normal): 10/10 IMPRESSION: 1. Normal CT appearance the brain. 2. ASPECTS is 10/10 These results were called by telephone at the time of interpretation on 11/19/2016 at 1:38 pm to Dr. Leonel Ramsay, who verbally acknowledged these results. Electronically Signed   By: San Morelle M.D.   On: 11/19/2016 13:38     TODAY-DAY OF DISCHARGE:  Subjective:   Alex Bryant today has no headache,no chest abdominal pain,no new weakness tingling or numbness, feels much better wants to go home today.   Objective:   Blood pressure (!) 162/76, pulse 72, temperature 98.8 F (37.1 C), temperature source Oral, resp. rate 20, height 6' 0.05" (1.83 m), weight 100.2 kg (220 lb 14.4 oz), SpO2 100 %. No intake or output data in the 24 hours ending 11/21/16 1553 Filed Weights   11/19/16 1324  Weight: 100.2 kg (220 lb 14.4 oz)    Exam: Awake  Alert, Oriented *3, No new F.N deficits, Normal affect [place].AT,PERRAL Supple Neck,No JVD, No cervical lymphadenopathy appriciated.  Symmetrical Chest wall movement, Good air movement bilaterally, CTAB RRR,No Gallops,Rubs or new Murmurs, No Parasternal Heave +ve B.Sounds, Abd Soft, Non tender, No organomegaly appriciated, No rebound -guarding or rigidity. No Cyanosis, Clubbing or edema, No new Rash or bruise   PERTINENT RADIOLOGIC STUDIES: Ct Angio Head W Or  Wo Contrast  Addendum Date: 11/20/2016   ADDENDUM REPORT: 11/20/2016 11:39 ADDENDUM: Dr. Erlinda Hong reports patient has no neck pain to suggest acute vertebral dissection. The described proximal left vertebral narrowings could be atheromatous instead of a limited segment dissection. Electronically Signed   By: Monte Fantasia M.D.   On: 11/20/2016 11:39   Result Date: 11/20/2016 CLINICAL DATA:  Left-sided weakness.  Possible stroke. EXAM: CT ANGIOGRAPHY HEAD AND NECK TECHNIQUE: Multidetector CT imaging of the head and neck was performed using the standard protocol during bolus administration of intravenous contrast. Multiplanar CT image reconstructions and MIPs were obtained to evaluate the vascular anatomy. Carotid stenosis measurements (when applicable) are obtained utilizing NASCET criteria, using the distal internal carotid diameter as the denominator. CONTRAST:  50 cc Isovue 370 intravenous COMPARISON:  Head CT from earlier today FINDINGS: CTA NECK FINDINGS Aortic arch: Mild atheromatous changes. Two vessel branching. No acute finding Right carotid system: Noncalcified plaque on the common carotid and at the bifurcation. No stenosis, ulceration, or dissection. Left carotid system: Noncalcified plaque in the mid common carotid and at the bifurcation without dissection or flow limiting stenosis. Vertebral arteries: No proximal subclavian stenosis. Mild right vertebral artery origin stenosis. Left vertebral artery is mildly dominant. There is intermittent luminal irregularity and narrowing in the V1 and V2 segments compatible with dissection. Normalized appearance by the distal V2 segment. Skeleton: No acute or aggressive finding. Other neck: No incidental mass or adenopathy. Upper chest: Negative Review of the MIP images confirms the above findings CTA HEAD FINDINGS Anterior circulation: Atherosclerotic calcification on the carotid siphons. Mild right supraclinoid ICA narrowing. No major branch occlusion. Hypoplastic  right A1 segment with sizable anterior communicating artery. Negative for aneurysm Posterior circulation: Atheromatous wall thickening and calcification on the bilateral V4 segments without flow limiting stenosis. Dominant right AICA and left PICA. The basilar is smooth and widely patent patent PCA bilaterally. Venous sinuses: Patent Anatomic variants: Hypoplastic right A1 segment as above. Delayed phase: No parenchymal enhancement or mass. Review of the MIP images confirms the above findings IMPRESSION: 1. No emergent large vessel occlusion. 2. 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 3. Intracranial atherosclerosis with mild right supraclinoid ICA narrowing. Electronically Signed: By: Monte Fantasia M.D. On: 11/19/2016 15:04   Ct Angio Neck W Or Wo Contrast  Addendum Date: 11/20/2016   ADDENDUM REPORT: 11/20/2016 11:39 ADDENDUM: Dr. Erlinda Hong reports patient has no neck pain to suggest acute vertebral dissection. The described proximal left vertebral narrowings could be atheromatous instead of a limited segment dissection. Electronically Signed   By: Monte Fantasia M.D.   On: 11/20/2016 11:39   Result Date: 11/20/2016 CLINICAL DATA:  Left-sided weakness.  Possible stroke. EXAM: CT ANGIOGRAPHY HEAD AND NECK TECHNIQUE: Multidetector CT imaging of the head and neck was performed using the standard protocol during bolus administration of intravenous contrast. Multiplanar CT image reconstructions and MIPs were obtained to evaluate the vascular anatomy. Carotid stenosis measurements (when applicable) are obtained utilizing NASCET criteria, using the distal internal carotid diameter as the denominator. CONTRAST:  50 cc Isovue 370 intravenous COMPARISON:  Head CT from earlier today FINDINGS: CTA NECK FINDINGS Aortic arch: Mild atheromatous changes. Two vessel branching. No acute finding Right carotid system: Noncalcified plaque on the common carotid and at the  bifurcation. No stenosis, ulceration, or dissection. Left carotid system: Noncalcified plaque in the mid common carotid and at the bifurcation without dissection or flow limiting stenosis. Vertebral arteries: No proximal subclavian stenosis. Mild right vertebral artery origin stenosis. Left vertebral artery is mildly dominant. There is intermittent luminal irregularity and narrowing in the V1 and V2 segments compatible with dissection. Normalized appearance by the distal V2 segment. Skeleton: No acute or aggressive finding. Other neck: No incidental mass or adenopathy. Upper chest: Negative Review of the MIP images confirms the above findings CTA HEAD FINDINGS Anterior circulation: Atherosclerotic calcification on the carotid siphons. Mild right supraclinoid ICA narrowing. No major branch occlusion. Hypoplastic right A1 segment with sizable anterior communicating artery. Negative for aneurysm Posterior circulation: Atheromatous wall thickening and calcification on the bilateral V4 segments without flow limiting stenosis. Dominant right AICA and left PICA. The basilar is smooth and widely patent patent PCA bilaterally. Venous sinuses: Patent Anatomic variants: Hypoplastic right A1 segment as above. Delayed phase: No parenchymal enhancement or mass. Review of the MIP images confirms the above findings IMPRESSION: 1. No emergent large vessel occlusion. 2. 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 3. Intracranial atherosclerosis with mild right supraclinoid ICA narrowing. Electronically Signed: By: Monte Fantasia M.D. On: 11/19/2016 15:04   Mr Brain Wo Contrast  Result Date: 11/19/2016 CLINICAL DATA:  65 y/o  M; right arm weakness. EXAM: MRI HEAD WITHOUT CONTRAST TECHNIQUE: Multiplanar, multiecho pulse sequences of the brain and surrounding structures were obtained without intravenous contrast. COMPARISON:  11/19/2016 CT head and CT angiogram head and neck.  FINDINGS: Brain: Punctate focus of diffusion restriction within the left paramedian parietal lobe (series 3, image 38 and series 4, image 9). No additional diffusion signal abnormality. No abnormal susceptibility hypointensity to indicate intracranial hemorrhage. Mild chronic microvascular ischemic changes. Mild brain parenchymal volume loss. No focal mass effect. No extra-axial collection. No effacement of basilar cisterns. No herniation. Vascular: Normal flow voids. Skull and upper cervical spine: Normal marrow signal. Sinuses/Orbits: Diffuse paranasal sinus disease greatest in left maxillary and left sphenoid sinuses. Partial left mastoid effusion. Orbits are unremarkable Other: None. IMPRESSION: 1. Punctate acute/early subacute infarct in left paramedian parietal lobe. No intracranial hemorrhage. 2. Mild chronic microvascular ischemic changes and parenchymal volume loss. 3. Mild to moderate paranasal sinus disease. These results will be called to the ordering clinician or representative by the Radiologist Assistant, and communication documented in the PACS or zVision Dashboard. Electronically Signed   By: Kristine Garbe M.D.   On: 11/19/2016 21:19   Mr Brain Limited Wo Contrast  Result Date: 11/20/2016 CLINICAL DATA:  65 y/o M; new transient right arm numbness and weakness, now resolved. EXAM: MRI HEAD WITHOUT CONTRAST TECHNIQUE: Multiplanar, multiecho pulse sequences of the brain and surrounding structures were obtained without intravenous contrast. COMPARISON:  11/19/2016 MRI of the brain. FINDINGS: Brain: Stable persistent left parietal punctate focus of diffusion restriction. At least 2 additional punctate foci of diffusion restriction in the left posterior frontal centrum semiovale and left high parietal cortex (series 3, image 35 and 43). Probable additional new punctate focus in the right parietal centrum semiovale (series 3, image 35). No abnormal susceptibility hypointensity to indicate  intracranial hemorrhage. Mild chronic microvascular ischemic  changes. Mild brain parenchymal volume loss. No focal mass effect, extra-axial collection, basilar cistern effacement, or herniation. Normal ventricle size. Vascular: Normal flow voids. Skull and upper cervical spine: Normal marrow signal. Sinuses/Orbits: Stable mild paranasal sinus disease and left-greater-than-right partial mastoid effusions. Orbits are unremarkable Other: None. IMPRESSION: At least 2 additional punctate foci of acute/ early subacute infarction within the left posterior frontal and parietal lobe. Probable additional punctate focus of infarction in the right parietal white matter. Multiple vascular territories suggests embolic etiology. No intracranial hemorrhage. These results will be called to the ordering clinician or representative by the Radiologist Assistant, and communication documented in the PACS or zVision Dashboard. Electronically Signed   By: Kristine Garbe M.D.   On: 11/20/2016 20:01   Ct Head Code Stroke W/o Cm  Result Date: 11/19/2016 CLINICAL DATA:  Code stroke. New onset right upper extremity weakness 45 minutes ago. EXAM: CT HEAD WITHOUT CONTRAST TECHNIQUE: Contiguous axial images were obtained from the base of the skull through the vertex without intravenous contrast. COMPARISON:  None. FINDINGS: Brain: No acute infarct, hemorrhage, or mass lesion is present. The ventricles are of normal size. No significant extraaxial fluid collection is present. No significant white matter disease is present. The basal ganglia and insular cortex is intact. No focal cortical lesions are present. Vascular: Atherosclerotic calcifications are present within the cavernous internal carotid arteries bilaterally. Calcifications are also present at the dural margin of the vertebral arteries. No hyperdense vessel is present. Skull: Calvarium is intact. Sinuses/Orbits: Mild mucosal thickening is present in the left maxillary sinus  and ethmoid air cells. There is some mucosal thickening in the inferior left frontal sinus. No fluid levels are present. The mastoid air cells are clear. ASPECTS [facility] Stroke Program Early CT Score) - Ganglionic level infarction (caudate, lentiform nuclei, internal capsule, insula, M1-M3 cortex): 7/7 - Supraganglionic infarction (M4-M6 cortex): 3/3 Total score (0-10 with 10 being normal): 10/10 IMPRESSION: 1. Normal CT appearance the brain. 2. ASPECTS is 10/10 These results were called by telephone at the time of interpretation on 11/19/2016 at 1:38 pm to Dr. Leonel Ramsay, who verbally acknowledged these results. Electronically Signed   By: San Morelle M.D.   On: 11/19/2016 13:38     PERTINENT LAB RESULTS: CBC:  Recent Labs  11/19/16 1315 11/19/16 1335 11/20/16 0259  WBC 7.5  --  5.9  HGB 14.7 13.9 14.5  HCT 40.9 41.0 40.0  PLT 207  --  145*   CMET CMP     Component Value Date/Time   NA 136 11/20/2016 0259   K 3.8 11/20/2016 0259   CL 104 11/20/2016 0259   CO2 26 11/20/2016 0259   GLUCOSE 221 (H) 11/20/2016 0259   BUN 17 11/20/2016 0259   CREATININE 1.20 11/20/2016 0259   CALCIUM 9.2 11/20/2016 0259   PROT 6.2 (L) 11/20/2016 0259   ALBUMIN 3.5 11/20/2016 0259   AST 18 11/20/2016 0259   ALT 19 11/20/2016 0259   ALKPHOS 60 11/20/2016 0259   BILITOT 0.8 11/20/2016 0259   GFRNONAA >60 11/20/2016 0259   GFRAA >60 11/20/2016 0259    GFR Estimated Creatinine Clearance: 73.3 mL/min (by C-G formula based on SCr of 1.2 mg/dL). No results for input(s): LIPASE, AMYLASE in the last 72 hours. No results for input(s): CKTOTAL, CKMB, CKMBINDEX, TROPONINI in the last 72 hours. Invalid input(s): POCBNP No results for input(s): DDIMER in the last 72 hours.  Recent Labs  11/20/16 0259  HGBA1C 7.8*    Recent Labs  11/20/16  0259  CHOL 147  HDL 33*  LDLCALC 62  TRIG 259*  CHOLHDL 4.5   No results for input(s): TSH, T4TOTAL, T3FREE, THYROIDAB in the last 72  hours.  Invalid input(s): FREET3 No results for input(s): VITAMINB12, FOLATE, FERRITIN, TIBC, IRON, RETICCTPCT in the last 72 hours. Coags:  Recent Labs  11/19/16 1315  INR 1.07   Microbiology: No results found for this or any previous visit (from the past 240 hour(s)).  FURTHER DISCHARGE INSTRUCTIONS:  Get Medicines reviewed and adjusted: Please take all your medications with you for your next visit with your Primary MD  Laboratory/radiological data: Please request your Primary MD to go over all hospital tests and procedure/radiological results at the follow up, please ask your Primary MD to get all Hospital records sent to his/her office.  In some cases, they will be blood work, cultures and biopsy results pending at the time of your discharge. Please request that your primary care M.D. goes through all the records of your hospital data and follows up on these results.  Also Note the following: If you experience worsening of your admission symptoms, develop shortness of breath, life threatening emergency, suicidal or homicidal thoughts you must seek medical attention immediately by calling 911 or calling your MD immediately  if symptoms less severe.  You must read complete instructions/literature along with all the possible adverse reactions/side effects for all the Medicines you take and that have been prescribed to you. Take any new Medicines after you have completely understood and accpet all the possible adverse reactions/side effects.   Do not drive when taking Pain medications or sleeping medications (Benzodaizepines)  Do not take more than prescribed Pain, Sleep and Anxiety Medications. It is not advisable to combine anxiety,sleep and pain medications without talking with your primary care practitioner  Special Instructions: If you have smoked or chewed Tobacco  in the last 2 yrs please stop smoking, stop any regular Alcohol  and or any Recreational drug use.  Wear Seat  belts while driving.  Please note: You were cared for by a hospitalist during your hospital stay. Once you are discharged, your primary care physician will handle any further medical issues. Please note that NO REFILLS for any discharge medications will be authorized once you are discharged, as it is imperative that you return to your primary care physician (or establish a relationship with a primary care physician if you do not have one) for your post hospital discharge needs so that they can reassess your need for medications and monitor your lab values.  Total Time spent coordinating discharge including counseling, education and face to face time equals 35 minutes  Signed: Oren Binet 11/21/2016 3:53 PM

## 2016-11-21 NOTE — Consult Note (Signed)
ELECTROPHYSIOLOGY CONSULT NOTE  Patient ID: Alex Bryant MRN: [id number], DOB/AGE: 24-Mar-1951   Admit date: 11/19/2016 Date of Consult: 11/21/2016  Primary Physician: Binnie Rail, MD Primary Cardiologist: none Reason for Consultation: Cryptogenic stroke ; recommendations regarding Implantable Loop Recorder Requesting MD: dr. Erlinda Hong  History of Present Illness Alex Bryant was admitted on 11/19/2016 with R arm weakness/"went limp".  They first developed symptoms while driving.  Resolved shortly after arriving to the hospital with recurrent R arm weakness again transiently yesterday with new areas of infarct noted on MR, started on Plavix by neurology.  PMHx includes, HTN, DM, HLD, gout.  Imaging demonstrated left paramedian parietal lobe punctate infarct.  he has undergone workup for stroke including echocardiogram and carotid angio.  The patient has been monitored on telemetry which has demonstrated sinus rhythm with no arrhythmias.  Inpatient stroke work-up is to be completed with a TEE.   Echocardiogram this admission demonstrated   Study Conclusions - Left ventricle: The cavity size was normal. There was mild   concentric hypertrophy. Systolic function was normal. The   estimated ejection fraction was in the range of 60% to 65%. Wall   motion was normal; there were no regional wall motion   abnormalities. There was an increased relative contribution of   atrial contraction to ventricular filling. Doppler parameters are   consistent with abnormal left ventricular relaxation (grade 1   diastolic dysfunction). - Aorta: Aortic root dimension: 39 mm (ED). - Aortic root: The aortic root was mildly dilated.  Lab work is reviewed.  Prior to admission, the patient denies chest pain, shortness of breath, dizziness,  or syncope.  hde mentions a significant amount of work stress and occassionally at night feels his heart rate a little fast, not irregular and attributes this to anxiety/stress.   They are recovering from their stroke with plans to home at discharge.  EP has been asked to evaluate for placement of an implantable loop recorder to monitor for atrial fibrillation.    Past Medical History:  Diagnosis Date  . Diabetes mellitus    AODM  . Gout   . Hyperlipidemia   . Hypertension   . 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 COLONOSCOPY    . COLONOSCOPY W/ POLYPECTOMY  2010   Dr Olevia Perches  . ESI  2014   @L5 -S1 X3   . FEMORAL HERNIA REPAIR     age 89  . fx right face     plating & pinning of maxilla  . lumbosacral fusion  10/17/2013   L5-S1 ; [place]  . POLYPECTOMY  2015   Dr Olevia Perches     Prescriptions Prior to Admission  Medication Sig Dispense Refill Last Dose  . aspirin 81 MG tablet Take 81 mg by mouth daily.     11/19/2016 at am  . clonazePAM (KLONOPIN) 0.5 MG tablet TAKE ONE-HALF TO 1 TABLET BY MOUTH EVERY NIGHT AT BEDTIME AS NEEDED--- needs appt for furthers refills (Patient taking differently: Take 0.25-0.5 mg by mouth at bedtime as needed (for sleep). ) 30 tablet 0 11/18/2016 at pm  . colchicine 0.6 MG tablet Take 0.6 mg by mouth See admin instructions. Once a day beginning at onset of gout symptoms (as needed)   Past Month at Unknown time  . glucose blood (FREESTYLE LITE) test strip 1 each by Other route 2 (two) times  daily. Use to check blood sugars twice a day. Dx E11.9 100 each 3 Taking  . glucose blood test strip 1 each by Other route as needed. Use as instructed    Taking  . Lancets (FREESTYLE) lancets 1 each by Other route 2 (two) times daily. Use to help check blood sugar twice a day. Dx E11.9 100 each 3   . metFORMIN (GLUCOPHAGE-XR) 500 MG 24 hr tablet TAKE 1 TABLET BY MOUTH TWICE DAILY WITH 2 LARGEST MEALS (Patient taking differently: Take 500 mg by mouth two times a day with largest meals) 180 tablet 0 11/19/2016 at am  . Omega-3 Fatty Acids (FISH OIL) 1000  MG CAPS Take 1 capsule by mouth daily.    11/19/2016 at am  . ramipril (ALTACE) 5 MG capsule TAKE 1 CAPSULE(5 MG) BY MOUTH DAILY 90 capsule 0 11/19/2016 at am  . simvastatin (ZOCOR) 20 MG tablet TAKE 1 TABLET BY MOUTH DAILY (Patient taking differently: Take 20 mg by mouth once a day) 90 tablet 0 11/18/2016 at pm    Inpatient Medications:  . aspirin  300 mg Rectal Daily   Or  . aspirin  325 mg Oral Daily  . clopidogrel  75 mg Oral Daily  . enoxaparin (LOVENOX) injection  40 mg Subcutaneous Q24H  . insulin aspart  0-9 Units Subcutaneous TID WC  . [START ON 11/22/2016] simvastatin  20 mg Oral q1800    Allergies:  Allergies  Allergen Reactions  . Oxycodone Anxiety    [name], confused, and constipation    Social History   Social History  . Marital status: Married    Spouse name: N/A  . Number of children: N/A  . Years of education: N/A   Occupational History  . Not on file.   Social History Main Topics  . Smoking status: Former Smoker    Quit date: 12/17/1980  . Smokeless tobacco: Never Used     Comment: smoked 1966- 1982, up to < 5 cigarettes / day  . Alcohol use 3.6 oz/week    6 Shots of liquor per week     Comment:  socially  . Drug use: No  . Sexual activity: Not on file   Other Topics Concern  . Not on file   Social History Narrative  . No narrative on file     Family History  Problem Relation Age of Onset  . Heart attack Father 49  . Hypertension Mother   . Stroke Mother     X82  . Diabetes Sister     "pre Diabetes"  . Cancer Neg Hx   . Colon cancer Neg Hx   . Rectal cancer Neg Hx   . Stomach cancer Neg Hx       Review of Systems: All other systems reviewed and are otherwise negative except as noted above.  Physical Exam: Vitals:   11/20/16 1655 11/20/16 2136 11/21/16 0142 11/21/16 0524  BP: (!) 144/80 135/71 130/72 135/80  Pulse: 64 63 (!) 59 63  Resp:  18 18 18   Temp: 97.8 F (36.6 C) 98 F (36.7 C) 97.5 F (36.4 C) 97.6 F (36.4 C)  TempSrc:  Oral Oral Oral Oral  SpO2: 100% 98% 95% 93%  Weight:      Height:        GEN- The patient is well appearing, alert and oriented x 3 today.   Head- normocephalic, atraumatic Eyes-  Sclera clear, conjunctiva pink Ears- hearing intact Oropharynx- clear Neck- supple Lungs- Clear to ausculation bilaterally,  normal work of breathing Heart- Regular rate and rhythm, no murmurs, rubs or gallops  GI- soft, NT, ND Extremities- no clubbing, cyanosis, or edema MS- no significant deformity or atrophy Skin- no rash or lesion Psych- euthymic mood, full affect   Labs:   Lab Results  Component Value Date   WBC 5.9 11/20/2016   HGB 14.5 11/20/2016   HCT 40.0 11/20/2016   MCV 84.9 11/20/2016   PLT 145 (L) 11/20/2016    Recent Labs Lab 11/20/16 0259  NA 136  K 3.8  CL 104  CO2 26  BUN 17  CREATININE 1.20  CALCIUM 9.2  PROT 6.2*  BILITOT 0.8  ALKPHOS 60  ALT 19  AST 18  GLUCOSE 221*   No results found for: CKTOTAL, CKMB, CKMBINDEX, TROPONINI Lab Results  Component Value Date   CHOL 147 11/20/2016   CHOL 150 12/30/2015   CHOL 153 05/25/2015   Lab Results  Component Value Date   HDL 33 (L) 11/20/2016   HDL 35.70 (L) 12/30/2015   HDL 38 (L) 05/25/2015   Lab Results  Component Value Date   LDLCALC 62 11/20/2016   LDLCALC 74 12/30/2015   LDLCALC 77 05/25/2015   Lab Results  Component Value Date   TRIG 259 (H) 11/20/2016   TRIG 199.0 (H) 12/30/2015   TRIG 188 (H) 05/25/2015   Lab Results  Component Value Date   CHOLHDL 4.5 11/20/2016   CHOLHDL 4 12/30/2015   CHOLHDL 4 03/16/2014   Lab Results  Component Value Date   LDLDIRECT 153.3 09/08/2007    No results found for: DDIMER   Radiology/Studies:   15/5/17 Brain MRI IMPRESSION: At least 2 additional punctate foci of acute/ early subacute infarction within the left posterior frontal and parietal lobe. Probable additional punctate focus of infarction in the right parietal white matter. Multiple vascular  territories suggests embolic etiology. No intracranial hemorrhage.    Ct Angio Head W Or Wo Contrast Addendum Date: 11/20/2016   ADDENDUM REPORT: 11/20/2016 11:39 ADDENDUM: Dr. Erlinda Hong reports patient has no neck pain to suggest acute vertebral dissection. The described proximal left vertebral narrowings could be atheromatous instead of a limited segment dissection. Electronically Signed   By: Monte Fantasia M.D.   On: 11/20/2016 11:39  Result Date: 11/20/2016 CLINICAL DATA:  Left-sided weakness.  Possible stroke. EXAM: CT ANGIOGRAPHY HEAD AND NECK TECHNIQUE: Multidetector CT imaging of the head and neck was performed using the standard protocol during bolus administration of intravenous contrast. Multiplanar CT image reconstructions and MIPs were obtained to evaluate the vascular anatomy. Carotid stenosis measurements (when applicable) are obtained utilizing NASCET criteria, using the distal internal carotid diameter as the denominator. CONTRAST:  50 cc Isovue 370 intravenous COMPARISON:  Head CT from earlier today FINDINGS: CTA NECK FINDINGS Aortic arch: Mild atheromatous changes. Two vessel branching. No acute finding Right carotid system: Noncalcified plaque on the common carotid and at the bifurcation. No stenosis, ulceration, or dissection. Left carotid system: Noncalcified plaque in the mid common carotid and at the bifurcation without dissection or flow limiting stenosis. Vertebral arteries: No proximal subclavian stenosis. Mild right vertebral artery origin stenosis. Left vertebral artery is mildly dominant. There is intermittent luminal irregularity and narrowing in the V1 and V2 segments compatible with dissection. Normalized appearance by the distal V2 segment. Skeleton: No acute or aggressive finding. Other neck: No incidental mass or adenopathy. Upper chest: Negative Review of the MIP images confirms the above findings CTA HEAD FINDINGS Anterior circulation: Atherosclerotic calcification on the  carotid siphons. Mild right supraclinoid ICA narrowing. No major branch occlusion. Hypoplastic right A1 segment with sizable anterior communicating artery. Negative for aneurysm Posterior circulation: Atheromatous wall thickening and calcification on the bilateral V4 segments without flow limiting stenosis. Dominant right AICA and left PICA. The basilar is smooth and widely patent patent PCA bilaterally. Venous sinuses: Patent Anatomic variants: Hypoplastic right A1 segment as above. Delayed phase: No parenchymal enhancement or mass. Review of the MIP images confirms the above findings IMPRESSION: 1. No emergent large vessel occlusion. 2. 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 3. Intracranial atherosclerosis with mild right supraclinoid ICA narrowing. Electronically Signed: By: Monte Fantasia M.D. On: 11/19/2016 15:04    Mr Brain Wo Contrast Result Date: 11/19/2016 CLINICAL DATA:  65 y/o  M; right arm weakness. EXAM: MRI HEAD WITHOUT CONTRAST TECHNIQUE: Multiplanar, multiecho pulse sequences of the brain and surrounding structures were obtained without intravenous contrast. COMPARISON:  11/19/2016 CT head and CT angiogram head and neck. FINDINGS: Brain: Punctate focus of diffusion restriction within the left paramedian parietal lobe (series 3, image 38 and series 4, image 9). No additional diffusion signal abnormality. No abnormal susceptibility hypointensity to indicate intracranial hemorrhage. Mild chronic microvascular ischemic changes. Mild brain parenchymal volume loss. No focal mass effect. No extra-axial collection. No effacement of basilar cisterns. No herniation. Vascular: Normal flow voids. Skull and upper cervical spine: Normal marrow signal. Sinuses/Orbits: Diffuse paranasal sinus disease greatest in left maxillary and left sphenoid sinuses. Partial left mastoid effusion. Orbits are unremarkable Other: None. IMPRESSION: 1. Punctate acute/early  subacute infarct in left paramedian parietal lobe. No intracranial hemorrhage. 2. Mild chronic microvascular ischemic changes and parenchymal volume loss. 3. Mild to moderate paranasal sinus disease. These results will be called to the ordering clinician or representative by the Radiologist Assistant, and communication documented in the PACS or zVision Dashboard. Electronically Signed   By: Kristine Garbe M.D.   On: 11/19/2016 21:19    12-lead ECG SR, 1st degree Avblock All prior EKG's in EPIC reviewed with no documented atrial fibrillation  Telemetry SR only, intermittent lead loss artifact  Assessment and Plan:  1. Cryptogenic stroke The patient presents with cryptogenic stroke.  The patient has a TEE planned for this AM.  I spoke at length with the patient about monitoring for afib with either a 30 day event monitor or an implantable loop recorder.  Risks, benefits, and alteratives to implantable loop recorder were discussed with the patient today.   At this time, the patient is very clear in their decision to proceed with implantable loop recorder.   Wound care was reviewed with the patient (keep incision clean and dry for 3 days).  Wound check will be scheduled for the patient.  Please call with questions.   Renee Dyane Dustman, PA-C 11/21/2016   I have seen, examined the patient, and reviewed the above assessment and plan.  On exam, RRR.  Changes to above are made where necessary.  TEE unremarkable.  Will therefore proceed with ILR placement to evaluate for arrhythmogenic cause of stroke including afib.  Co Sign: Thompson Grayer, MD 11/21/2016 12:51 PM

## 2016-11-21 NOTE — CV Procedure (Addendum)
Procedure: TEE  Indication: CVA  Sedation: Versed 4 mg IV, Fentanyl 75 mcg IV  Findings: See echo section for full report.  Normal LV size and thickness.  EF 55-60% with normal wall motion.  Normal RV size and systolic function.  Normal left atrial size, no LA appendage thrombus.  Normal right atrium.  No ASD/PFO by color doppler.  No significant mitral regurgitation.  No significant tricuspid regurgitation.  Trivial PI.  Trileaflet aortic valve with no significant regurgitation or stenosis.  Normal caliber aorta with grade IV plaque in 1 area of the descending thoracic aorta, nothing concerning in arch.    Impression: No source of embolus.  Alex Bryant 11/21/2016 11:35 AM

## 2016-11-21 NOTE — Progress Notes (Signed)
Discharge instructions reviewed with pt, wife and daughter. Emphasis on adherence to medication regimen, and follow up appointment.

## 2016-11-21 NOTE — Progress Notes (Signed)
  Echocardiogram Echocardiogram Transesophageal has been performed.  Alex Bryant 11/21/2016, 12:57 PM

## 2016-11-21 NOTE — Interval H&P Note (Signed)
History and Physical Interval Note:  11/21/2016 11:14 AM  Alex Bryant  has presented today for surgery, with the diagnosis of stroke  The various methods of treatment have been discussed with the patient and family. After consideration of risks, benefits and other options for treatment, the patient has consented to  Procedure(s): TRANSESOPHAGEAL ECHOCARDIOGRAM (TEE) (N/A) as a surgical intervention .  The patient's history has been reviewed, patient examined, no change in status, stable for surgery.  I have reviewed the patient's chart and labs.  Questions were answered to the patient's satisfaction.     [name] [employer]

## 2016-11-21 NOTE — Care Management Note (Signed)
Case Management Note  Patient Details  Name: Alex Bryant MRN: [id number] Date of Birth: 02/05/1951  Subjective/Objective:                    Action/Plan: Pt discharging to home with no needs per PT/OT and no DME. No further needs per CM.   Expected Discharge Date:                  Expected Discharge Plan:  Home/Self Care  In-House Referral:     Discharge planning Services     Post Acute Care Choice:    Choice offered to:     DME Arranged:    DME Agency:     HH Arranged:    [facility] Agency:     Status of Service:  Completed, signed off  If discussed at [employer] of Stay Meetings, dates discussed:    Additional Comments:  Pollie Friar, RN 11/21/2016, 3:37 PM

## 2016-11-22 ENCOUNTER — Encounter (HOSPITAL_COMMUNITY): Payer: Self-pay | Admitting: Cardiology

## 2016-11-22 NOTE — Progress Notes (Signed)
STROKE TEAM PROGRESS NOTE   SUBJECTIVE (INTERVAL HISTORY) Wife and daughter are at bedside. Pt had one episode yesterday afternoon resolved in 5 min. Gave plavix load and high dose statin. Repeat MRI showed additional two punctate acute infarcts at right and left MCA area. TEE done and loop recorder placed.    OBJECTIVE Temp:  [97.5 F (36.4 C)-98.8 F (37.1 C)] 98.8 F (37.1 C) (12/06 1347) Pulse Rate:  [59-72] 72 (12/06 1347) Resp:  [14-22] 20 (12/06 1347) BP: (130-179)/(72-95) 162/76 (12/06 1347) SpO2:  [92 %-100 %] 100 % (12/06 1347)  CBC:   Recent Labs Lab 11/19/16 1315 11/19/16 1335 11/20/16 0259  WBC 7.5  --  5.9  NEUTROABS 3.7  --   --   HGB 14.7 13.9 14.5  HCT 40.9 41.0 40.0  MCV 85.6  --  84.9  PLT 207  --  145*    Basic Metabolic Panel:   Recent Labs Lab 11/19/16 1315 11/19/16 1335 11/20/16 0259  NA 138 138 136  K 4.3 4.6 3.8  CL 105 103 104  CO2 25  --  26  GLUCOSE 191* 189* 221*  BUN 14 17 17   CREATININE 1.12 1.20 1.20  CALCIUM 9.4  --  9.2    Lipid Panel:     Component Value Date/Time   CHOL 147 11/20/2016 0259   CHOL 153 05/25/2015 0924   TRIG 259 (H) 11/20/2016 0259   TRIG 188 (H) 05/25/2015 0924   HDL 33 (L) 11/20/2016 0259   HDL 38 (L) 05/25/2015 0924   CHOLHDL 4.5 11/20/2016 0259   VLDL 52 (H) 11/20/2016 0259   LDLCALC 62 11/20/2016 0259   LDLCALC 77 05/25/2015 0924   HgbA1c:  Lab Results  Component Value Date   HGBA1C 7.8 (H) 11/20/2016   Urine Drug Screen:     Component Value Date/Time   LABOPIA NONE DETECTED 11/19/2016 1503   COCAINSCRNUR NONE DETECTED 11/19/2016 1503   LABBENZ NONE DETECTED 11/19/2016 1503   AMPHETMU NONE DETECTED 11/19/2016 1503   THCU NONE DETECTED 11/19/2016 1503   LABBARB NONE DETECTED 11/19/2016 1503      IMAGING I have personally reviewed the radiological images below and agree with the radiology interpretations.  Ct Angio Head W Or Wo Contrast Ct Angio Neck W Or Wo Contrast Date:  11/19/2016   11/20/2016 11:39  ADDENDUM: Dr. Erlinda Hong reports patient has no neck pain to suggest acute vertebral dissection. The described proximal left vertebral narrowings could be atheromatous instead of a limited segment dissection.  11/19/2016 15:04  1. No emergent large vessel occlusion. 2. 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 3. Intracranial atherosclerosis with mild right supraclinoid ICA narrowing.   Mr Brain Wo Contrast 11/19/2016 1. Punctate acute/early subacute infarct in left paramedian parietal lobe. No intracranial hemorrhage. 2. Mild chronic microvascular ischemic changes and parenchymal volume loss. 3. Mild to moderate paranasal sinus disease.   Ct Head Code Stroke W/o Cm 11/19/2016 1. Normal CT appearance the brain. 2. ASPECTS is 10/10   2-D echocardiogram - Left ventricle: The cavity size was normal. There was mild   concentric hypertrophy. Systolic function was normal. The   estimated ejection fraction was in the range of 60% to 65%. Wall   motion was normal; there were no regional wall motion   abnormalities. There was an increased relative contribution of   atrial contraction to ventricular filling. Doppler parameters are   consistent with abnormal left ventricular relaxation (grade  1   diastolic dysfunction). - Aorta: Aortic root dimension: 39 mm (ED). - Aortic root: The aortic root was mildly dilated.  Mr Brain Limited Wo Contrast 11/20/2016 IMPRESSION: At least 2 additional punctate foci of acute/ early subacute infarction within the left posterior frontal and parietal lobe. Probable additional punctate focus of infarction in the right parietal white matter. Multiple vascular territories suggests embolic etiology. No intracranial hemorrhage.   TEE -  Normal LV size and thickness.  EF 55-60% with normal wall motion.  Normal RV size and systolic function.  Normal left atrial size, no LA appendage thrombus.  Normal  right atrium.  No ASD/PFO by color doppler.  No significant mitral regurgitation.  No significant tricuspid regurgitation.  Trivial PI.  Trileaflet aortic valve with no significant regurgitation or stenosis.  Normal caliber aorta with grade IV plaque in 1 area of the descending thoracic aorta, nothing concerning in arch.   PHYSICAL EXAM  Temp:  [97.5 F (36.4 C)-98.8 F (37.1 C)] 98.8 F (37.1 C) (12/06 1347) Pulse Rate:  [59-72] 72 (12/06 1347) Resp:  [14-22] 20 (12/06 1347) BP: (130-179)/(72-95) 162/76 (12/06 1347) SpO2:  [92 %-100 %] 100 % (12/06 1347)  General - Well nourished, well developed, in no apparent distress.  Ophthalmologic - Sharp disc margins OU.   Cardiovascular - Regular rate and rhythm.  Mental Status -  Level of arousal and orientation to time, place, and person were intact. Language including expression, naming, repetition, comprehension was assessed and found intact. Fund of Knowledge was assessed and was intact.  Cranial Nerves II - XII - II - Visual field intact OU. III, IV, VI - Extraocular movements intact. V - Facial sensation intact bilaterally. VII - Facial movement intact bilaterally. VIII - Hearing & vestibular intact bilaterally. X - Palate elevates symmetrically. XI - Chin turning & shoulder shrug intact bilaterally. XII - Tongue protrusion intact.  Motor Strength - The patient's strength was normal in all extremities and pronator drift was absent.  Bulk was normal and fasciculations were absent.   Motor Tone - Muscle tone was assessed at the neck and appendages and was normal.  Reflexes - The patient's reflexes were 1+ in all extremities and he had no pathological reflexes.  Sensory - Light touch, temperature/pinprick were assessed and were symmetrical.    Coordination - The patient had normal movements in the hands with no ataxia or dysmetria.  Tremor was absent.  Gait and Station - The patient's transfers, posture, gait, station, and  turns were observed as normal.   ASSESSMENT/PLAN [name] is a 65 y.o. male with history of diabetes, hypertension, hypercholesterolemia and gout presenting with right arm weakness, numbness and dyscoordination. He did not receive IV t-PA due to mild deficit.   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 No LV dissection, LV atheromatous  2D Echo  EF 60-65%  LDL 62  HgbA1c 7.8  TEE unremarkable, no PFO  Loop recorder placed.   Lovenox 40 mg sq daily for VTE prophylaxis Diet - low sodium heart healthy Diet Carb Modified  aspirin 81 mg daily prior to admission, now on ASA and plavix. Continue aspirin 81 and plavix for 3 weeks and  then plavix alone.   Patient counseled to be compliant with his antithrombotic medications  Ongoing aggressive stroke risk factor management  Therapy recommendations:  No therapy needs  Disposition:  Return home  Hypertension  Stable  Permissive hypertension (OK if < 220/120) but gradually normalize in 5-7 days  Long-term BP goal normotensive  Hyperlipidemia  Home meds:  zocor 20, resumed in hospital  LDL 62, goal < 70  Continue statin at discharge  Diabetes  HgbA1c 7.8, goal < 7.0  uncontrolled  CBG monitoring  SSI  Close follow up with PCP  Other Stroke Risk Factors  Advanced age  ETOH use, advised to drink no more than 2 drink(s) a day  Overweight, Body mass index is 29.92 kg/m., recommend weight loss, diet and exercise as appropriate   Family hx stroke (mother)  Hospital day # 1  Neurology will sign off. Please call with questions. Pt will follow up with Dr. Erlinda Hong at [facility] in about 6 weeks. Thanks  for the consult.   Rosalin Hawking, MD PhD Stroke Neurology 11/22/2016 12:18 AM    To contact Stroke Continuity provider, please refer to [email or web address] After hours, contact General Neurology

## 2016-11-23 ENCOUNTER — Telehealth: Payer: Self-pay | Admitting: *Deleted

## 2016-11-23 NOTE — Telephone Encounter (Signed)
Tried calling pt to set up TCM hosp f/u appt no answer LMOM RTC.../lmb 

## 2016-11-26 NOTE — Telephone Encounter (Signed)
Called pt concerning hosp f/u. Wife states she called this am and made appt for 12/18. Completed TCM call below.../lmb   Transition Care Management Follow-up Telephone Call   Date discharged? 11/21/16   How have you been since you were released from the hospital? Wife states he is doing alright   Do you understand why you were in the hospital? YES   Do you understand the discharge instructions? YES   Where were you discharged to? Home   Items Reviewed:  Medications reviewed: YES  Allergies reviewed: YES  Dietary changes reviewed: YES, heart healhy & carb modified  Referrals reviewed: No referral   Functional Questionnaire:   Activities of Daily Living (ADLs):   She  states he are independent in the following: ambulation, bathing and hygiene, feeding, continence, grooming, toileting and dressing States he  require assistance with the following: ambulation   Any transportation issues/concerns?: NO   Any patient concerns? NO   Confirmed importance and date/time of follow-up visits scheduled YES, 12/03/16  Provider Appointment booked with Dr. Quay Burow   Confirmed with patient if condition begins to worsen call PCP or go to the ER.  Patient was given the office number and encouraged to call back with question or concerns.  : YES

## 2016-11-28 DIAGNOSIS — J029 Acute pharyngitis, unspecified: Secondary | ICD-10-CM | POA: Diagnosis not present

## 2016-11-28 DIAGNOSIS — R52 Pain, unspecified: Secondary | ICD-10-CM | POA: Diagnosis not present

## 2016-11-28 DIAGNOSIS — J0141 Acute recurrent pansinusitis: Secondary | ICD-10-CM | POA: Diagnosis not present

## 2016-11-29 ENCOUNTER — Telehealth: Payer: Self-pay | Admitting: *Deleted

## 2016-11-29 NOTE — Telephone Encounter (Signed)
Patient called requesting to have hospital stroke follow up scheduled. Scheduled him for 6 week follow up with Dr Erlinda Hong. He then stated his back surgeon is requesting clearance to prescribe him a prednisone patch for his feet due to his recent stroke. He stated the surgeon has prescribed this in the past for the numbness in the bottom of his feet, but he will not prescribe unless Dr Erlinda Hong gives clearance that it is okay. Please call.

## 2016-11-29 NOTE — Telephone Encounter (Signed)
I am not aware of any association of prednisone patch and stroke. Pt does have DM, and steroids may interfere with DM control which is a stroke risk factor, but there is no direction association between steroids and stroke. As long as pt DM controlled well with prednisone patch, I am Ok with it. Please let the pt know. Thanks.   Rosalin Hawking, MD PhD Stroke Neurology 11/29/2016 5:56 PM

## 2016-11-30 NOTE — Telephone Encounter (Signed)
Rn call patient back about prednisone patch and having a recent stroke. Rn stated per Dr. Erlinda Hong note, as long as his diabetes is controlled he can wear the patch. Rn stated per Dr. Erlinda Hong note there is no direct association between steroids and a stroke.Rn stated he wanted the Md to know of the clearance. Rn stated a call can be made to the office.PT gave name of Rachel(PA) and number of [phone].

## 2016-11-30 NOTE — Telephone Encounter (Addendum)
Rn call Rachel(PA) for patient. Rn gave the below message from Dr. Erlinda Hong regarding the prednisone patch. Apolonio Schneiders verbalized understanding of Dr. Erlinda Hong message. Rn stated Dr. Erlinda Hong is okay with the prednisone patch.Rn stated pt is on aspirin and plavix. Rn stated per Dr. Erlinda Hong note there is no direct association between steroids and and a stroke. Rachel(PA) verbalized understanding.

## 2016-12-02 NOTE — Progress Notes (Signed)
Subjective:    Patient ID: Alex Bryant, male    DOB: 09/26/1951, 65 y.o.   MRN: [id number]  HPI He is here for follow up from the hospital.          He was admitted 11/19/16 - 11/21/16 for a stroke.   He went to the ED with transient right arm numbness/weakness that occurred when he was driving.  He was not able to control his arm.  He did not have any headaches, difficulty speaking, change in vision, chest pain and SOB.  He went to the ED immediately.  He had right arm weakness in the ED.  Code stroke was called and neuro saw him.  His symptoms started to improve and was almost back to baseline.  He had a ct angio of the neck and head - CT was normal.    An MRI of his brain showed a small left paramedian infarct.  A repeat MRI of his brain showed 2 additional punctate foci of acute or early subacute infarction.  He had a loop recorder placed on 11/22/16.  His a1c on 12/5 is 7.8.  Watching sugars/carbs.  No sugars since he was hospitalized.  He is doing some walking and will increase his exercise when able.  He plans on starting wth a trainer. His metormin was increased to 1000 twice daily.  His sugars at home 110-190.    The bottom of his feel have decreased sensation since his back surgery.  he has a follow up with her surgeon to evaluate because he feels it is getting worse.    Insomnia:  He has been having difficulty sleeping.  The clonazpem is no longer effective. He tried a friend's [place] and it worked really well and he wondered if he could try it.    Recommendations for Outpatient Follow-up:  1. Follow up with PCP in 1-2 weeks 2. Please obtain BMP/CBC in one week 3. Repeat A1C in 3 months 4. ASA/Plavix for 3 weeks, then Plavix alone 5. Ensure follow up with cardiology and Neurology  Medications and allergies reviewed with patient and updated if appropriate.  Patient Active Problem List   Diagnosis Date Noted  . Cerebral thrombosis with cerebral infarction 11/20/2016  .  Acute CVA (cerebrovascular accident) ([place]) 11/20/2016  . CVA (cerebral vascular accident) ([place])   . Right arm numbness 11/19/2016  . Cough 02/29/2016  . Plantar fasciitis of right foot 02/17/2014  . Lumbosacral spondylosis without myelopathy 05/25/2013  . Essential hypertension 01/24/2011  . MEDIAL MENISCUS TEAR, RIGHT 07/06/2010  . Diabetes type 2, uncontrolled ([place]) 01/06/2009  . URTICARIA 01/06/2009  . Tubular adenoma of colon 08/26/2008  . Hyperlipidemia 02/24/2008  . History of gout 11/14/2007  . Sleep disorder 09/08/2007  . 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 aspirin 81 MG tablet Take 81 mg by mouth daily.      . clopidogrel (PLAVIX) 75 MG tablet Take 1 tablet (75 mg total) by mouth daily. 60 tablet 0  . colchicine 0.6 MG tablet Take 0.6 mg by mouth See admin instructions. Once a day beginning at onset of gout symptoms (as needed)    . glucose blood (FREESTYLE LITE) test strip 1 each by Other route 2 (two) times daily. Use to check blood sugars twice a day. Dx E11.9 100 each 3  . glucose blood test strip 1 each by Other route as  needed. Use as instructed     . Lancets (FREESTYLE) lancets 1 each by Other route 2 (two) times daily. Use to help check blood sugar twice a day. Dx E11.9 100 each 3  . metFORMIN (GLUCOPHAGE) 1000 MG tablet Take 1 tablet (1,000 mg total) by mouth 2 (two) times daily with a meal. 60 tablet 0  . Omega-3 Fatty Acids (FISH OIL) 1000 MG CAPS Take 1 capsule by mouth daily.     . ramipril (ALTACE) 5 MG capsule TAKE 1 CAPSULE(5 MG) BY MOUTH DAILY 90 capsule 0  . simvastatin (ZOCOR) 20 MG tablet TAKE 1 TABLET BY MOUTH DAILY (Patient taking differently: Take 20 mg by mouth once a day) 90 tablet 0   No current facility-administered medications on file prior to visit.     Past Medical History:  Diagnosis Date  . Diabetes mellitus    AODM  . Gout   .  Hyperlipidemia   . Hypertension   . Tick bite [id number]   Erlichiosis;Eulis Canner MD, ID    Past Surgical History:  Procedure Laterality Date  . COLONOSCOPY    . COLONOSCOPY W/ POLYPECTOMY  2010   Dr Olevia Perches  . EP IMPLANTABLE DEVICE N/A 11/21/2016   Procedure: Loop Recorder Insertion;  Surgeon: Thompson Grayer, MD;  Location: [facility];  Service: Cardiovascular;  Laterality: N/A;  . ESI  2014   @L5 -S1 X3   . FEMORAL HERNIA REPAIR     age 65  . fx right face     plating & pinning of maxilla  . lumbosacral fusion  10/17/2013   L5-S1 ; [place]  . POLYPECTOMY  2015   Dr Olevia Perches  . TEE WITHOUT CARDIOVERSION N/A 11/21/2016   Procedure: TRANSESOPHAGEAL ECHOCARDIOGRAM (TEE);  Surgeon: Larey Dresser, MD;  Location: [facility] ENDOSCOPY;  Service: Cardiovascular;  Laterality: N/A;    Social History   Social History  . Marital status: Married    Spouse name: N/A  . Number of children: N/A  . Years of education: N/A   Social History Main Topics  . Smoking status: Former Smoker    Quit date: 12/17/1980  . Smokeless tobacco: Never Used     Comment: smoked 1966- 1982, up to < 5 cigarettes / day  . Alcohol use 3.6 oz/week    6 Shots of liquor per week     Comment:  socially  . Drug use: No  . Sexual activity: Not on file   Other Topics Concern  . Not on file   Social History Narrative  . No narrative on file    Family History  Problem Relation Age of Onset  . Heart attack Father 63  . Hypertension Mother   . Stroke Mother     X30  . Diabetes Sister     "pre Diabetes"  . Cancer Neg Hx   . Colon cancer Neg Hx   . Rectal cancer Neg Hx   . Stomach cancer Neg Hx     Review of Systems  Constitutional: Negative for fever.  Respiratory: Negative for cough, shortness of breath and wheezing.   Cardiovascular: Positive for palpitations (occ). Negative for chest pain and leg swelling.  Neurological: Negative for light-headedness and headaches.       Objective:   Vitals:     12/03/16 1604  BP: 122/80  Pulse: 67  Resp: 16  Temp: 98 F (36.7 C)   Filed Weights   12/03/16 1604  Weight: 210 lb (95.3 kg)  Body mass index is 28.44 kg/m.   Physical Exam Constitutional: Appears well-developed and well-nourished. No distress.  HENT:  Head: Normocephalic and atraumatic.  Neck: Neck supple. No tracheal deviation present. No thyromegaly present.  No cervical lymphadenopathy Cardiovascular: Normal rate, regular rhythm and normal heart sounds.   No murmur heard. No carotid bruit .  No edema Pulmonary/Chest: Effort normal and breath sounds normal. No respiratory distress. No has no wheezes. No rales.  Skin: Skin is warm and dry. Not diaphoretic.  Psychiatric: Normal mood and affect. Behavior is normal.         Assessment & Plan:   See Problem List for Assessment and Plan of chronic medical problems.

## 2016-12-03 ENCOUNTER — Ambulatory Visit (INDEPENDENT_AMBULATORY_CARE_PROVIDER_SITE_OTHER): Payer: PPO | Admitting: Internal Medicine

## 2016-12-03 ENCOUNTER — Encounter: Payer: Self-pay | Admitting: Internal Medicine

## 2016-12-03 VITALS — BP 122/80 | HR 67 | Temp 98.0°F | Resp 16 | Wt 210.0 lb

## 2016-12-03 DIAGNOSIS — E1159 Type 2 diabetes mellitus with other circulatory complications: Secondary | ICD-10-CM | POA: Diagnosis not present

## 2016-12-03 DIAGNOSIS — Z8673 Personal history of transient ischemic attack (TIA), and cerebral infarction without residual deficits: Secondary | ICD-10-CM | POA: Diagnosis not present

## 2016-12-03 DIAGNOSIS — G479 Sleep disorder, unspecified: Secondary | ICD-10-CM

## 2016-12-03 DIAGNOSIS — I10 Essential (primary) hypertension: Secondary | ICD-10-CM | POA: Diagnosis not present

## 2016-12-03 DIAGNOSIS — Z23 Encounter for immunization: Secondary | ICD-10-CM

## 2016-12-03 DIAGNOSIS — E1165 Type 2 diabetes mellitus with hyperglycemia: Secondary | ICD-10-CM

## 2016-12-03 DIAGNOSIS — I639 Cerebral infarction, unspecified: Secondary | ICD-10-CM

## 2016-12-03 DIAGNOSIS — IMO0002 Reserved for concepts with insufficient information to code with codable children: Secondary | ICD-10-CM

## 2016-12-03 MED ORDER — ZOLPIDEM TARTRATE 5 MG PO TABS: 5.0000 mg | ORAL_TABLET | Freq: Every evening | ORAL | 2 refills | Status: DC | PRN

## 2016-12-03 NOTE — Assessment & Plan Note (Signed)
BP well controlled Current regimen effective and well tolerated Continue current medications at current doses Blood work at next visit

## 2016-12-03 NOTE — Assessment & Plan Note (Signed)
Clonazepam no longer effective Tried a friends Lorrin Mais and it worked well - no side effects Would like to try it [employer] 5 mg nightly  D/c clonazepam

## 2016-12-03 NOTE — Patient Instructions (Signed)
   Flu immunization administered today.   Medications reviewed and updated.   No changes recommended at this time.   Please followup in 3 months   

## 2016-12-03 NOTE — Progress Notes (Signed)
Pre visit review using our clinic review tool, if applicable. No additional management support is needed unless otherwise documented below in the visit note. 

## 2016-12-03 NOTE — Assessment & Plan Note (Signed)
Taking ASA and plavix - will stop ASA next week Continue plavix Has cardio and neuro follow up No concerning symptoms Follow up in 3 months Stressed good sugar control

## 2016-12-03 NOTE — Assessment & Plan Note (Signed)
Lab Results  Component Value Date   HGBA1C 7.8 (H) 11/20/2016   Not ideally controlled Metformin increased to 1000 mg BID Has changed diet and is walking - will increase exercise Has lost 5 lbs and will continue to work on weight loss F/u in 3 months - if a1c still elevated will add second medication

## 2016-12-05 ENCOUNTER — Ambulatory Visit (INDEPENDENT_AMBULATORY_CARE_PROVIDER_SITE_OTHER): Payer: PPO | Admitting: *Deleted

## 2016-12-05 DIAGNOSIS — Z95818 Presence of other cardiac implants and grafts: Secondary | ICD-10-CM

## 2016-12-05 DIAGNOSIS — I639 Cerebral infarction, unspecified: Secondary | ICD-10-CM

## 2016-12-05 LAB — CUP PACEART INCLINIC DEVICE CHECK
Date Time Interrogation Session: 20171220143016
MDC IDC PG IMPLANT DT: 20171206

## 2016-12-05 NOTE — Progress Notes (Signed)
Wound check appointment. Steri-strips removed. Wound without redness or edema. Incision edges approximated, wound well healed. Normal device function. Battery status: good. R-waves 0.73mV. No symptom, tachy, or AF episodes. Pause and brady detection off. Patient educated about Carelink monitor. Monthly summary reports and ROV with JA PRN.

## 2016-12-05 NOTE — Patient Instructions (Signed)
Follow-up with Dr. Rayann Heman as needed.

## 2016-12-19 DIAGNOSIS — R201 Hypoesthesia of skin: Secondary | ICD-10-CM | POA: Diagnosis not present

## 2016-12-19 DIAGNOSIS — Z981 Arthrodesis status: Secondary | ICD-10-CM | POA: Diagnosis not present

## 2016-12-20 ENCOUNTER — Other Ambulatory Visit: Payer: Self-pay | Admitting: Internal Medicine

## 2016-12-20 MED ORDER — METFORMIN HCL 1000 MG PO TABS: 1000.0000 mg | ORAL_TABLET | Freq: Two times a day (BID) | ORAL | 3 refills | Status: DC

## 2016-12-20 NOTE — Telephone Encounter (Signed)
Patient states that when he had his stroke the hospital increased his dosage to 1000mg  twice daily.  Pharmacy is requesting new script to be sent to them.

## 2016-12-20 NOTE — Telephone Encounter (Signed)
Please advise 

## 2016-12-21 ENCOUNTER — Ambulatory Visit (INDEPENDENT_AMBULATORY_CARE_PROVIDER_SITE_OTHER): Payer: PPO | Admitting: *Deleted

## 2016-12-21 DIAGNOSIS — I639 Cerebral infarction, unspecified: Secondary | ICD-10-CM | POA: Diagnosis not present

## 2016-12-21 NOTE — Progress Notes (Signed)
Carelink Summary Report / Loop Recorder 

## 2016-12-27 ENCOUNTER — Other Ambulatory Visit: Payer: Self-pay | Admitting: Orthopedic Surgery

## 2016-12-27 DIAGNOSIS — R201 Hypoesthesia of skin: Secondary | ICD-10-CM

## 2016-12-27 DIAGNOSIS — Z981 Arthrodesis status: Secondary | ICD-10-CM

## 2017-01-01 DIAGNOSIS — H52222 Regular astigmatism, left eye: Secondary | ICD-10-CM | POA: Diagnosis not present

## 2017-01-01 DIAGNOSIS — E119 Type 2 diabetes mellitus without complications: Secondary | ICD-10-CM | POA: Diagnosis not present

## 2017-01-01 DIAGNOSIS — H52221 Regular astigmatism, right eye: Secondary | ICD-10-CM | POA: Diagnosis not present

## 2017-01-01 DIAGNOSIS — H35033 Hypertensive retinopathy, bilateral: Secondary | ICD-10-CM | POA: Diagnosis not present

## 2017-01-01 DIAGNOSIS — H524 Presbyopia: Secondary | ICD-10-CM | POA: Diagnosis not present

## 2017-01-01 DIAGNOSIS — H2513 Age-related nuclear cataract, bilateral: Secondary | ICD-10-CM | POA: Diagnosis not present

## 2017-01-01 DIAGNOSIS — H5201 Hypermetropia, right eye: Secondary | ICD-10-CM | POA: Diagnosis not present

## 2017-01-01 LAB — HM DIABETES EYE EXAM

## 2017-01-07 ENCOUNTER — Ambulatory Visit (INDEPENDENT_AMBULATORY_CARE_PROVIDER_SITE_OTHER): Payer: PPO | Admitting: Neurology

## 2017-01-07 ENCOUNTER — Encounter: Payer: Self-pay | Admitting: Neurology

## 2017-01-07 VITALS — BP 125/76 | HR 68 | Ht 72.0 in | Wt 209.4 lb

## 2017-01-07 DIAGNOSIS — E1159 Type 2 diabetes mellitus with other circulatory complications: Secondary | ICD-10-CM | POA: Diagnosis not present

## 2017-01-07 DIAGNOSIS — I63413 Cerebral infarction due to embolism of bilateral middle cerebral arteries: Secondary | ICD-10-CM | POA: Diagnosis not present

## 2017-01-07 DIAGNOSIS — IMO0002 Reserved for concepts with insufficient information to code with codable children: Secondary | ICD-10-CM

## 2017-01-07 DIAGNOSIS — I10 Essential (primary) hypertension: Secondary | ICD-10-CM | POA: Diagnosis not present

## 2017-01-07 DIAGNOSIS — E782 Mixed hyperlipidemia: Secondary | ICD-10-CM | POA: Diagnosis not present

## 2017-01-07 DIAGNOSIS — E1165 Type 2 diabetes mellitus with hyperglycemia: Secondary | ICD-10-CM | POA: Diagnosis not present

## 2017-01-07 NOTE — Progress Notes (Signed)
STROKE NEUROLOGY FOLLOW UP NOTE  NAME: Alex Bryant DOB: [birth year]  REASON FOR VISIT: stroke follow up HISTORY FROM: pt and chart  Today we had the pleasure of seeing Alex Bryant in follow-up at our Neurology Clinic. Pt was accompanied by no one.   History Summary [name] is a 66 y.o. male with history of diabetes, hypertension, hypercholesterolemia and gout was admitted on 11/19/16 for right arm weakness, numbness and dyscoordination. 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 Symptoms resolved on admission. However, second day of admission, pt had another episode of right arm weakness and numbness lasted about 30 min and resolved. MRI limited showed additional 2 punctate infarcts at bilateral MCA territories, concerning for embolic pattern. CTA head and neck, TTE, TEE all unremarkable. LDL 62 and A1C 7.8. Loop recorder placed. He was discharged home with ASA and plavix and continued on zocor.    Interval History During the interval time, the patient has been doing well. No recurrent stroke like symptoms. No other complaints. He feels good and has lost 14 pounds during the time. Continued on dual antiplatelet and Zocor without side effect. BP 125/76. He stated that he is over much better controlled at home, height is 140, average 90s, but this morning only and 40s.    REVIEW OF SYSTEMS: Full 14 system review of systems performed and notable only for those listed below and in HPI above, all others are negative:  Constitutional:   Cardiovascular:  Ear/Nose/Throat:   Skin:  Eyes:   Respiratory:   Gastroitestinal:   Genitourinary:  Hematology/Lymphatic:   Endocrine:  Musculoskeletal:   Allergy/Immunology:   Neurological:   Psychiatric:  Sleep:   The following represents the patient's updated allergies and side effects list: Allergies  Allergen Reactions  . Oxycodone Anxiety    [name], confused, and constipation    The neurologically relevant  items on the patient's problem list were reviewed on today's visit.  Neurologic Examination  A problem focused neurological exam (12 or more points of the single system neurologic examination, vital signs counts as 1 point, cranial nerves count for 8 points) was performed.  Blood pressure 125/76, pulse 68, height 6' (1.829 m), weight 209 lb 6.4 oz (95 kg).  General - Well nourished, well developed, in no apparent distress.  Ophthalmologic - Sharp disc margins OU.   Cardiovascular - Regular rate and rhythm with no murmur.  Mental Status -  Level of arousal and orientation to time, place, and person were intact. Language including expression, naming, repetition, comprehension was assessed and found intact. Fund of Knowledge was assessed and was intact.  Cranial Nerves II - XII - II - Visual field intact OU. III, IV, VI - Extraocular movements intact. V - Facial sensation intact bilaterally. VII - Facial movement intact bilaterally. VIII - Hearing & vestibular intact bilaterally. X - Palate elevates symmetrically. XI - Chin turning & shoulder shrug intact bilaterally. XII - Tongue protrusion intact.  Motor Strength - The patient's strength was normal in all extremities and pronator drift was absent.  Bulk was normal and fasciculations were absent.   Motor Tone - Muscle tone was assessed at the neck and appendages and was normal.  Reflexes - The patient's reflexes were 1+ in all extremities and he had no pathological reflexes.  Sensory - Light touch, temperature/pinprick, vibration and proprioception, and Romberg testing were assessed and were normal.    Coordination - The patient had normal movements in the hands and feet  with no ataxia or dysmetria.  Tremor was absent.  Gait and Station - The patient's transfers, posture, gait, station, and turns were observed as normal.   Functional score  mRS = 0   0 - No symptoms.   1 - No significant disability. Able to carry out all  usual activities, despite some symptoms.   2 - Slight disability. Able to look after own affairs without assistance, but unable to carry out all previous activities.   3 - Moderate disability. Requires some help, but able to walk unassisted.   4 - Moderately severe disability. Unable to attend to own bodily needs without assistance, and unable to walk unassisted.   5 - Severe disability. Requires constant nursing care and attention, bedridden, incontinent.   6 - Dead.   NIH Stroke Scale = 0   Data reviewed: I personally reviewed the images and agree with the radiology interpretations.  Ct Angio Head W Or Wo Contrast Ct Angio Neck W Or Wo Contrast Date: 11/19/2016   11/20/2016 11:39  ADDENDUM: Dr. Erlinda Hong reports patient has no neck pain to suggest acute vertebral dissection. The described proximal left vertebral narrowings could be atheromatous instead of a limited segment dissection.  11/19/2016 15:04  1. No emergent large vessel occlusion. 2. 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 3. Intracranial atherosclerosis with mild right supraclinoid ICA narrowing.   Mr Brain Wo Contrast 11/19/2016 1. Punctate acute/early subacute infarct in left paramedian parietal lobe. No intracranial hemorrhage. 2. Mild chronic microvascular ischemic changes and parenchymal volume loss. 3. Mild to moderate paranasal sinus disease.   Ct Head Code Stroke W/o Cm 11/19/2016 1. Normal CT appearance the brain. 2. ASPECTS is 10/10   2-D echocardiogram - Left ventricle: The cavity size was normal. There was mild concentric hypertrophy. Systolic function was normal. The estimated ejection fraction was in the range of 60% to 65%. Wall motion was normal; there were no regional wall motion abnormalities. There was an increased relative contribution of atrial contraction to ventricular filling. Doppler parameters are consistent with abnormal left  ventricular relaxation (grade 1 diastolic dysfunction). - Aorta: Aortic root dimension: 39 mm (ED). - Aortic root: The aortic root was mildly dilated.  Mr Brain Limited Wo Contrast 11/20/2016 IMPRESSION: At least 2 additional punctate foci of acute/ early subacute infarction within the left posterior frontal and parietal lobe. Probable additional punctate focus of infarction in the right parietal white matter. Multiple vascular territories suggests embolic etiology. No intracranial hemorrhage.   TEE -  Normal LV size and thickness. EF 55-60% with normal wall motion. Normal RV size and systolic function. Normal left atrial size, no LA appendage thrombus. Normal right atrium. No ASD/PFO by color doppler. No significant mitral regurgitation. No significant tricuspid regurgitation. Trivial PI. Trileaflet aortic valve with no significant regurgitation or stenosis. Normal caliber aorta with grade IV plaque in 1 area of the descending thoracic aorta, nothing concerning in arch.  Component     Latest Ref Rng & Units 11/20/2016  Cholesterol     0 - 200 mg/dL 147  Triglycerides     <150 mg/dL 259 (H)  HDL Cholesterol     >40 mg/dL 33 (L)  Total CHOL/HDL Ratio     RATIO 4.5  VLDL     0 - 40 mg/dL 52 (H)  LDL (calc)     0 - 99 mg/dL 62  Hemoglobin A1C     4.8 - 5.6 % 7.8 (H)  Mean Plasma Glucose     mg/dL 177    Assessment: As you may recall, he is a 66 y.o. Caucasian male with PMH of diabetes, hypertension, hyperlipidemia and gout was admitted on 11/19/16 for left paramedian parietal lobe punctate infarcts. Symptoms resolved on admission. However, second day of admission, pt had another episode of similar symptoms lasted about 30 min and resolved. MRI limited showed additional 2 punctate infarcts at bilateral MCA territories, concerning for embolic pattern. CTA head and neck, TTE, TEE all unremarkable. LDL 62 and A1C 7.8. Loop recorder placed. He was discharged home with ASA and plavix  and continued on zocor. During the interval time, the patient has been doing well. No recurrent stroke like symptoms. BP and glucose under control.    Plan:  - continue ASA and plavix for another 1.5 months and then plavix alone.  - continue simvastatin for storke prevention - follow up with PCP for lipid monitoring. If TG still high, may consider treatment but relationship of TG with CVD not clear - Follow up with your primary care physician for stroke risk factor modification. Recommend maintain blood pressure goal <130/80, diabetes with hemoglobin A1c goal below 7.0% and lipids with LDL cholesterol goal below 70 mg/dL.  - check BP and glucose at home and record - continue loop recorder monitoring - health diet and regular exercise - follow up in 4 months with me.   No orders of the defined types were placed in this encounter.   No orders of the defined types were placed in this encounter.   Patient Instructions  - continue ASA and plavix for another 1.5 months and then plavix alone.  - continue simvastatin for storke prevention - follow up with PCP for lipid monitoring. If TG still high, may consider treatment but relationship of TG with CVD not clear - Follow up with your primary care physician for stroke risk factor modification. Recommend maintain blood pressure goal <130/80, diabetes with hemoglobin A1c goal below 7.0% and lipids with LDL cholesterol goal below 70 mg/dL.  - check BP and glucose at home and record - continue loop recorder monitoring - health diet and regular exercise - follow up in 4 months with me   Rosalin Hawking, MD PhD [facility] [address], [place], [place] [id number] [phone]

## 2017-01-07 NOTE — Patient Instructions (Addendum)
-   continue ASA and plavix for another 1.5 months and then plavix alone.  - continue simvastatin for storke prevention - follow up with PCP for lipid monitoring. If TG still high, may consider treatment but relationship of TG with CVD not clear - Follow up with your primary care physician for stroke risk factor modification. Recommend maintain blood pressure goal <130/80, diabetes with hemoglobin A1c goal below 7.0% and lipids with LDL cholesterol goal below 70 mg/dL.  - check BP and glucose at home and record - continue loop recorder monitoring - health diet and regular exercise - follow up in 4 months with me

## 2017-01-15 ENCOUNTER — Ambulatory Visit
Admission: RE | Admit: 2017-01-15 | Discharge: 2017-01-15 | Disposition: A | Discharge status: Home / Self Care | Payer: PPO | Source: Ambulatory Visit | Attending: Orthopedic Surgery | Admitting: Orthopedic Surgery

## 2017-01-15 DIAGNOSIS — R201 Hypoesthesia of skin: Secondary | ICD-10-CM

## 2017-01-15 DIAGNOSIS — Z981 Arthrodesis status: Secondary | ICD-10-CM

## 2017-01-15 DIAGNOSIS — M48061 Spinal stenosis, lumbar region without neurogenic claudication: Secondary | ICD-10-CM | POA: Diagnosis not present

## 2017-01-18 ENCOUNTER — Other Ambulatory Visit: Payer: Self-pay | Admitting: Internal Medicine

## 2017-01-18 MED ORDER — CLOPIDOGREL BISULFATE 75 MG PO TABS: 75.0000 mg | ORAL_TABLET | Freq: Every day | ORAL | 1 refills | Status: DC

## 2017-01-21 ENCOUNTER — Ambulatory Visit (INDEPENDENT_AMBULATORY_CARE_PROVIDER_SITE_OTHER): Payer: PPO | Admitting: *Deleted

## 2017-01-21 DIAGNOSIS — I639 Cerebral infarction, unspecified: Secondary | ICD-10-CM | POA: Diagnosis not present

## 2017-01-22 ENCOUNTER — Telehealth: Payer: Self-pay | Admitting: *Deleted

## 2017-01-22 DIAGNOSIS — E1140 Type 2 diabetes mellitus with diabetic neuropathy, unspecified: Secondary | ICD-10-CM | POA: Insufficient documentation

## 2017-01-22 DIAGNOSIS — E1142 Type 2 diabetes mellitus with diabetic polyneuropathy: Secondary | ICD-10-CM

## 2017-01-22 NOTE — Progress Notes (Signed)
Carelink Summary Report / Loop Recorder 

## 2017-01-22 NOTE — Telephone Encounter (Signed)
I called rachel and she stated that pt had L5/S1 disc fusion surgery 3 years ago. And lately, patient complaining of recurrent bilateral feet numbness at soles, constant and intermittently goes to calves. Had recent MRI lumbar spine was unremarkable. Now need to consider EMG/NCS to evaluate diabetic neuropathy. Apolonio Schneiders would like to know if we can order that and do it at our clinic. I agree and will put in order.   Mr Lumbar Spine Wo Contrast 01/15/2017 IMPRESSION: 1. At L4-5 there is a mild broad-based disc bulge. Moderate bilateral facet arthropathy. Bilateral lateral recess narrowing. Mild spinal stenosis. 2. Posterior lumbar interbody fusion at L5-S1 without recurrent foraminal or central canal stenosis.   Rosalin Hawking, MD PhD Stroke Neurology 01/22/2017 5:01 PM  Orders Placed This Encounter  Procedures  . NCV with EMG(electromyography)    Standing Status:   Future    Standing Expiration Date:   01/22/2018

## 2017-01-29 ENCOUNTER — Other Ambulatory Visit: Payer: Self-pay | Admitting: Internal Medicine

## 2017-01-30 IMAGING — MR MR LUMBAR SPINE W/O CM
7 series · 43 of 48 positions shown · non-contrast
Comparison: 05/28/2013

CLINICAL DATA: Lumbar surgery 4 years ago.  Numbness in both feet.

EXAM:
MRI LUMBAR SPINE WITHOUT CONTRAST
TECHNIQUE: Multiplanar, multisequence MR imaging of the lumbar spine was
performed. No intravenous contrast was administered.

[Series 3: T2 · sagittal · 4.0mm · 0.94mm/px · 4 of 13 slices shown (1 of 3)]
[im 1/13]
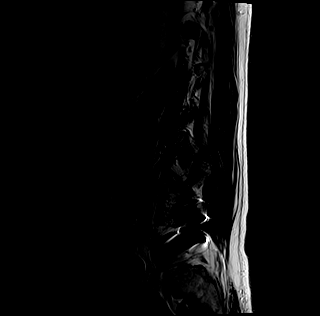
[im 5/13]
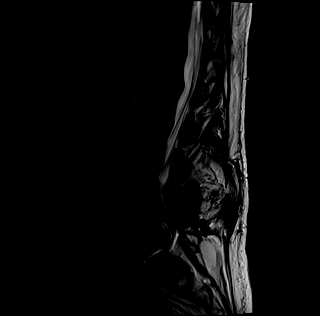
[im 9/13]
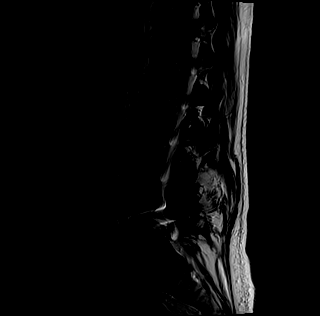
[im 13/13]
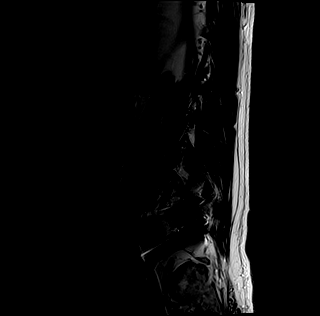

[Series 4: T1 · sagittal · 4.0mm · 0.94mm/px · 5 of 13 slices shown (1 of 3)]
[im 1/13]
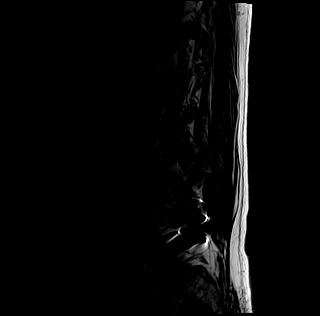
[im 4/13]
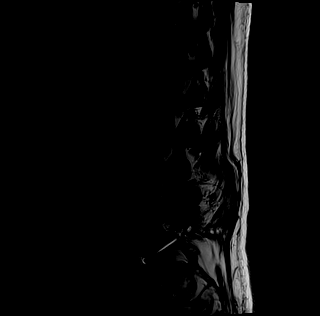
[im 7/13]
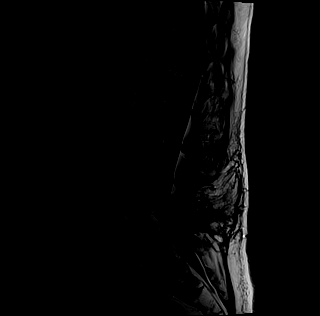
[im 10/13]
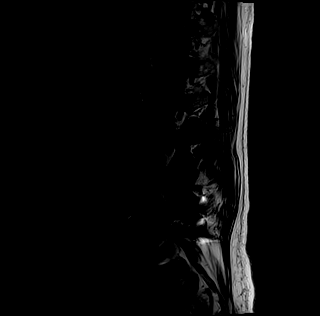
[im 13/13]
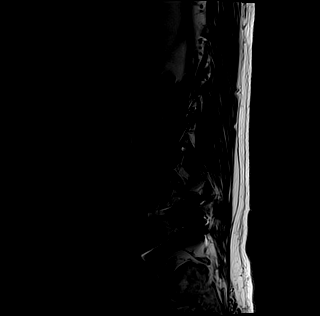

[Series 5: tirm sag · sagittal · 4.0mm · 0.59mm/px · 5 of 13 slices shown]
[im 1/13]
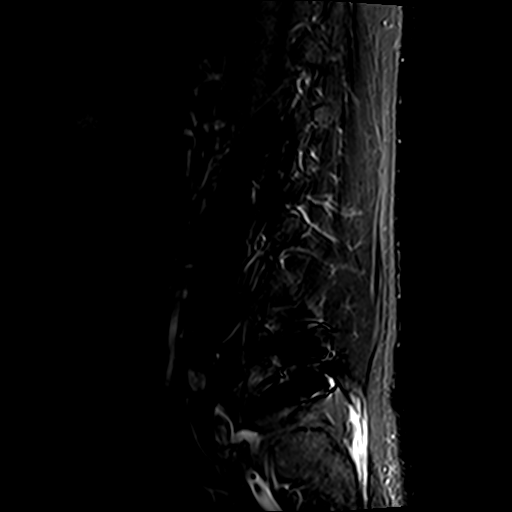
[im 4/13]
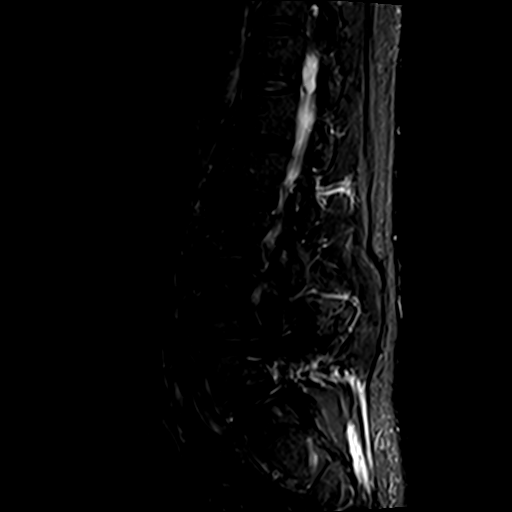
[im 7/13]
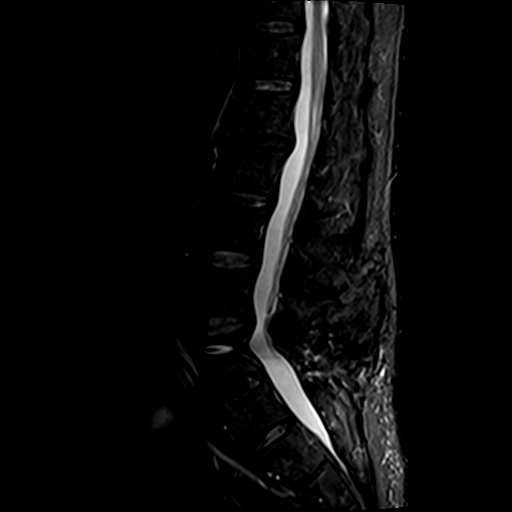
[im 10/13]
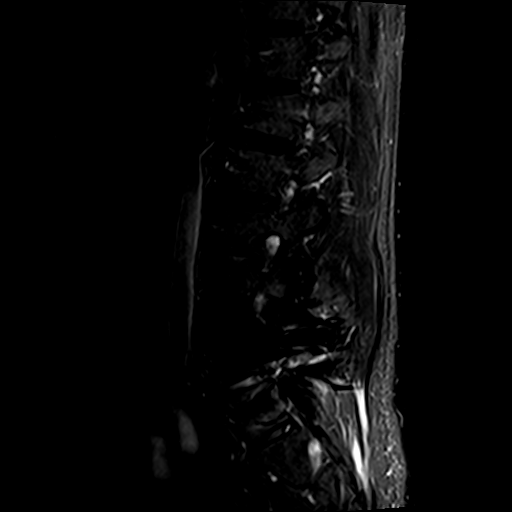
[im 13/13]
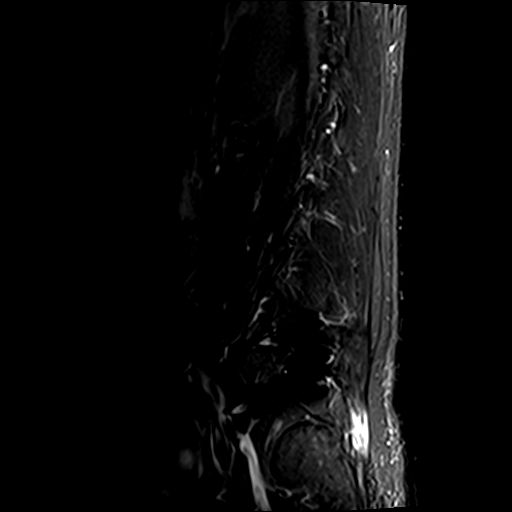

[Series 6: T1 · axial · 4.0mm · 0.78mm/px · z∈[-217,-27]mm · 8 of 35 slices shown (2 of 3)]
[im 1/35]
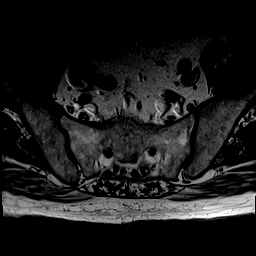
[im 7/35]
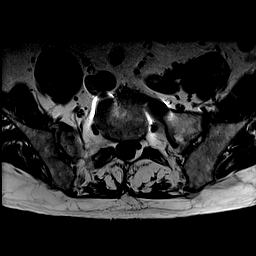
[im 10/35]
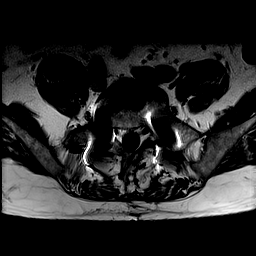
[im 16/35]
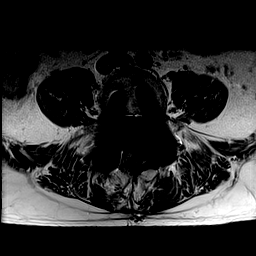
[im 19/35]
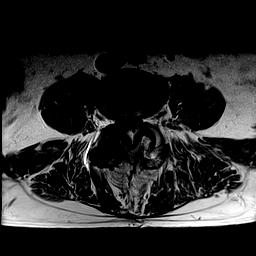
[im 25/35]
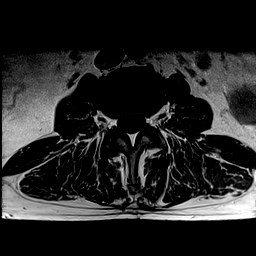
[im 28/35]
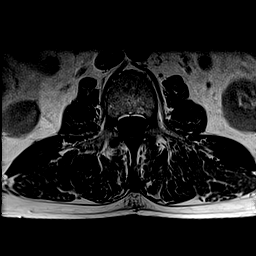
[im 35/35]
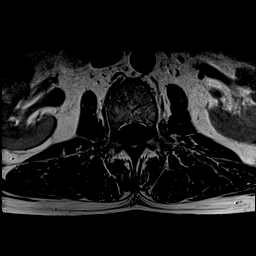

[Series 7: T2 · axial · 4.0mm · 0.78mm/px · z∈[-217,-27]mm · 11 of 35 slices shown (2 of 3)]
[im 1/35]
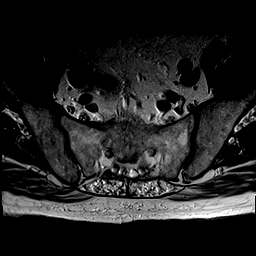
[im 4/35]
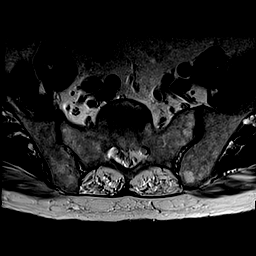
[im 7/35]
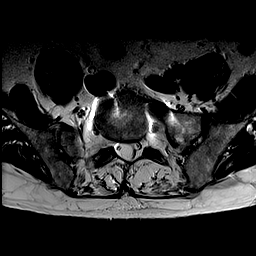
[im 10/35]
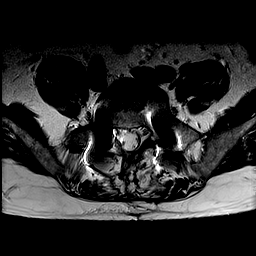
[im 13/35]
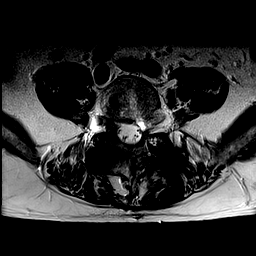
[im 16/35]
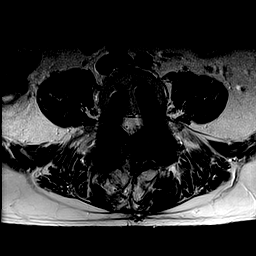
[im 19/35]
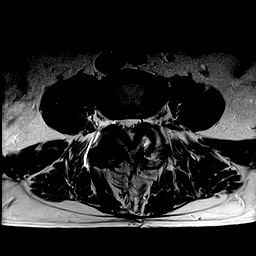
[im 25/35]
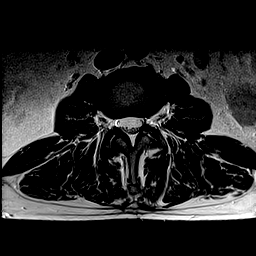
[im 28/35]
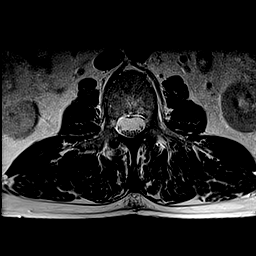
[im 31/35]
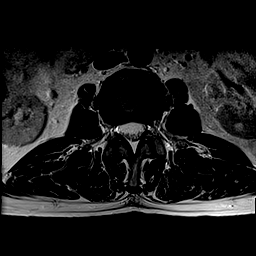
[im 35/35]
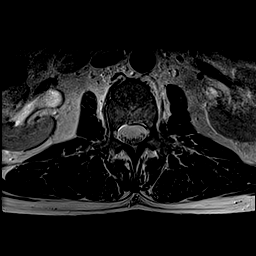

[Series 8: T2 · coronal · 5.0mm · 0.94mm/px · 5 of 13 slices shown (3 of 3)]
[im 1/13]
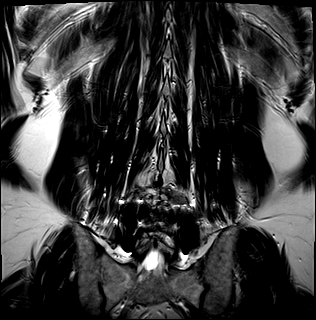
[im 4/13]
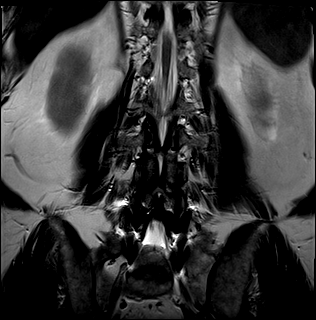
[im 7/13]
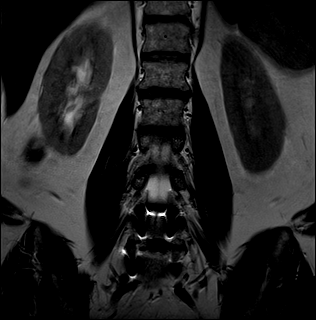
[im 10/13]
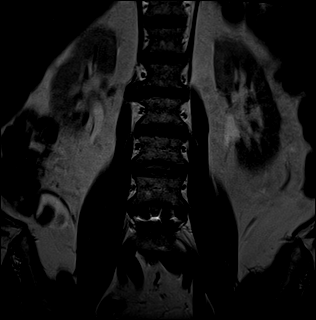
[im 13/13]
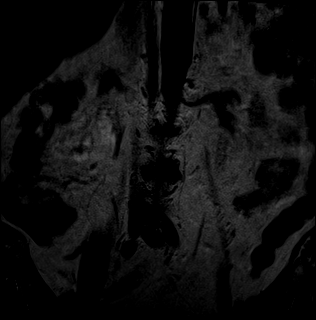

[Series 9: T1 · coronal · 5.0mm · 0.94mm/px · 5 of 13 slices shown (3 of 3)]
[im 1/13]
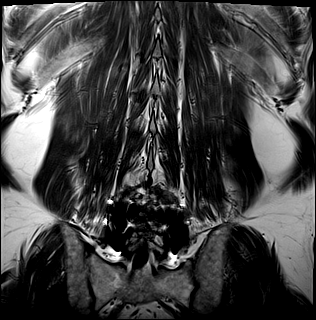
[im 4/13]
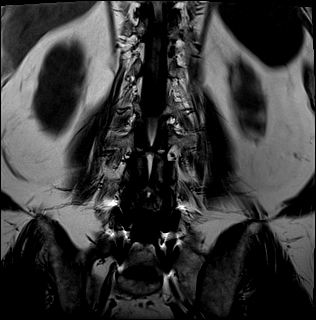
[im 7/13]
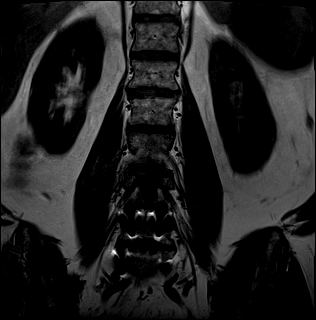
[im 10/13]
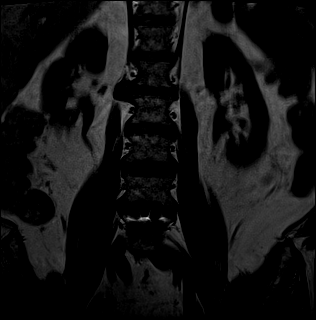
[im 13/13]
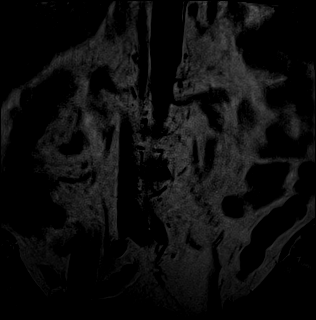

[43 of 48 positions shown; findings below may reference images not displayed]

FINDINGS: Segmentation: Transitional anatomy with lumbarization of the S1
vertebral body.

Alignment:  Physiologic.

Vertebrae:  No fracture, evidence of discitis, or bone lesion.

Conus medullaris: Extends to the L1 level and appears normal.

Paraspinal and other soft tissues: Postsurgical changes in the
posterior paraspinal soft tissues at L5-S1.

Disc levels:

Disc spaces: Posterior lumbar interbody fusion at L5-S1.

T12-L1: No significant disc bulge. No evidence of neural foraminal
stenosis. No central canal stenosis.

L1-L2: Broad-based disc bulge. No evidence of neural foraminal
stenosis. No central canal stenosis.

L2-L3: No significant disc bulge. No evidence of neural foraminal
stenosis. No central canal stenosis.

L3-L4: No significant disc bulge. Mild bilateral facet arthropathy.
No evidence of neural foraminal stenosis. No central canal stenosis.

L4-L5: Mild broad-based disc bulge. Moderate bilateral facet
arthropathy. Bilateral lateral recess narrowing. Mild spinal
stenosis. No evidence of neural foraminal stenosis.

L5-S1: Interbody fusion. No evidence of neural foraminal stenosis.
No central canal stenosis.
IMPRESSION: 1. At L4-5 there is a mild broad-based disc bulge. Moderate
bilateral facet arthropathy. Bilateral lateral recess narrowing.
Mild spinal stenosis.
2. Posterior lumbar interbody fusion at L5-S1 without recurrent
foraminal or central canal stenosis.

## 2017-02-03 LAB — CUP PACEART REMOTE DEVICE CHECK
Date Time Interrogation Session: 20180105173817
Implantable Pulse Generator Implant Date: 20171206

## 2017-02-03 NOTE — Progress Notes (Signed)
Carelink summary report received. Battery status OK. Normal device function. No new symptom episodes, tachy episodes, brady, or pause episodes. No new AF episodes. Monthly summary reports and ROV/PRN 

## 2017-02-15 ENCOUNTER — Other Ambulatory Visit: Payer: Self-pay | Admitting: Internal Medicine

## 2017-02-16 LAB — CUP PACEART REMOTE DEVICE CHECK
Date Time Interrogation Session: 20180204173845
MDC IDC PG IMPLANT DT: 20171206

## 2017-02-16 NOTE — Progress Notes (Signed)
Carelink summary report received. Battery status OK. Normal device function. No new symptom episodes, tachy episodes, brady, or pause episodes. No new AF episodes. Monthly summary reports and ROV/PRN 

## 2017-02-19 ENCOUNTER — Ambulatory Visit (INDEPENDENT_AMBULATORY_CARE_PROVIDER_SITE_OTHER): Payer: PPO | Admitting: *Deleted

## 2017-02-19 DIAGNOSIS — I639 Cerebral infarction, unspecified: Secondary | ICD-10-CM | POA: Diagnosis not present

## 2017-02-19 NOTE — Progress Notes (Signed)
Carelink Summary Report / Loop Recorder 

## 2017-03-04 ENCOUNTER — Ambulatory Visit (INDEPENDENT_AMBULATORY_CARE_PROVIDER_SITE_OTHER): Payer: Self-pay | Admitting: Neurology

## 2017-03-04 ENCOUNTER — Encounter: Payer: Self-pay | Admitting: Neurology

## 2017-03-04 ENCOUNTER — Ambulatory Visit (INDEPENDENT_AMBULATORY_CARE_PROVIDER_SITE_OTHER): Payer: PPO | Admitting: Neurology

## 2017-03-04 DIAGNOSIS — E1142 Type 2 diabetes mellitus with diabetic polyneuropathy: Secondary | ICD-10-CM | POA: Diagnosis not present

## 2017-03-04 NOTE — Progress Notes (Signed)
Please refer to EMG and nerve conduction study procedure note. 

## 2017-03-04 NOTE — Procedures (Signed)
     HISTORY:  Alex Bryant is a 66 year old gentleman with a prior history of lumbosacral spine surgery who is reporting some numbness in the feet that has been present with some increase over the last 6-8 months. The patient does have a history of diabetes. He is being evaluated for a possible peripheral neuropathy.  NERVE CONDUCTION STUDIES:  Nerve conduction studies were performed on both lower extremities. The distal motor latencies for the peroneal nerves were prolonged on the right, normal on the left, with a low motor amplitude on the right, normal on the left. The nerve conduction velocities for these nerves were normal bilaterally. The distal motor latencies for the posterior tibial nerves were prolonged bilaterally with normal motor amplitudes for these nerves bilaterally. There were normal nerve conduction velocities for these nerves. The H reflex latencies were unobtainable bilaterally. The sensory latencies for the peroneal nerves were unobtainable bilaterally.  EMG STUDIES:  EMG study was performed on the left lower extremity:  The tibialis anterior muscle reveals 2 to 4K motor units with decreased recruitment. No fibrillations or positive waves were seen. The peroneus tertius muscle reveals 2 to 5K motor units with decreased recruitment. No fibrillations or positive waves were seen. The medial gastrocnemius muscle reveals 1 to 3K motor units with full recruitment. No fibrillations or positive waves were seen. The vastus lateralis muscle reveals 2 to 4K motor units with full recruitment. No fibrillations or positive waves were seen. The iliopsoas muscle reveals 2 to 4K motor units with full recruitment. No fibrillations or positive waves were seen. The biceps femoris muscle (long head) reveals 2 to 4K motor units with full recruitment. No fibrillations or positive waves were seen. The lumbosacral paraspinal muscles were tested at 3 levels, and revealed no abnormalities of  insertional activity at all 3 levels tested. There was good relaxation.  EMG study was performed on the right lower extremity:  The tibialis anterior muscle reveals 2 to 4K motor units with decreased recruitment. No fibrillations or positive waves were seen. The peroneus tertius muscle reveals 2 to 5K motor units with decreased recruitment. No fibrillations or positive waves were seen. The medial gastrocnemius muscle reveals 1 to 4K motor units with decreased recruitment. No fibrillations or positive waves were seen. The vastus lateralis muscle reveals 2 to 4K motor units with full recruitment. No fibrillations or positive waves were seen. The iliopsoas muscle reveals 2 to 4K motor units with full recruitment. No fibrillations or positive waves were seen. The biceps femoris muscle (long head) reveals 2 to 4K motor units with full recruitment. No fibrillations or positive waves were seen. The lumbosacral paraspinal muscles were tested at 3 levels, and revealed no abnormalities of insertional activity at all 3 levels tested. There was good relaxation.   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 EMG evaluation of both lower extremities shows mild chronic distal signs of denervation consistent with the diagnosis of peripheral neuropathy. No evidence of an ongoing lumbosacral radiculopathy is seen on either side.  Jill Alexanders MD 03/04/2017 4:27 PM  [facility] [address] [place], [place] [address]  Phone [phone] Fax [phone]

## 2017-03-05 ENCOUNTER — Other Ambulatory Visit: Payer: Self-pay | Admitting: Internal Medicine

## 2017-03-05 NOTE — Telephone Encounter (Signed)
RX faxed to [facility] 

## 2017-03-07 ENCOUNTER — Telehealth: Payer: Self-pay

## 2017-03-07 NOTE — Telephone Encounter (Signed)
PA started via covermymeds.  Key: [id number]

## 2017-03-13 ENCOUNTER — Telehealth: Payer: Self-pay

## 2017-03-13 NOTE — Telephone Encounter (Signed)
Rn receive incoming call from pt about his EMG. Rn gave him results written by Dr. Glenna Durand verbalized understanding. RN ask pt was the gabapentin helping his neuropathy. Pt stated he is not taking gabapentin. Rn stated a rx was sent to his pharmacy about another MD. Pt stated he did take it for his back in the last couple of years. Pt was unaware it was sent. Rn stated a message will be sent to Dr. Erlinda Hong for a new rx. Pt knows he will be given a call once sent. ------

## 2017-03-13 NOTE — Telephone Encounter (Signed)
Patient needs script for gabapentin.

## 2017-03-13 NOTE — Telephone Encounter (Signed)
-----   Message from Rosalin Hawking, MD sent at 03/07/2017  3:25 PM EDT ----- Could you please let the pt know that recent nerve conduction study is consistent with neuropathy, likely due to his pre-diabetes vs. Diabetes. He is on right medication gabapentin for neuropathy treatment. Please let him call us if he still feels symptoms not under control. Thanks.   Rosalin Hawking, MD PhD Stroke Neurology 03/07/2017 3:25 PM

## 2017-03-14 NOTE — Telephone Encounter (Signed)
RN call patient back about Dr.Xu not prescribing gabapentin at this time. Rn stated if he was having any symptoms. Pt stated right now he is doing fine. Pt has not taken gabapentin since last year when he had back surgery. Rn stated per Dr .Erlinda Hong if she starts to have numbness,tingling, in hands and feet at extreme rate, he will do a new rx. Pt verbalized understanding.

## 2017-03-14 NOTE — Telephone Encounter (Signed)
Hi, Katrina, I do not know who prescribed gabapentin to him in the past but that was on his medication list. If he is not on gabapentin and no symptoms, that means he does not need to be on gabapentin, which is good. We will just need to continue monitor him. If in the future, he developed numbness tingling of hands and feet and not able to tolerate, we can consider gabapentin treatment at that time. Thanks.   Rosalin Hawking, MD PhD Stroke Neurology 03/14/2017 3:06 PM

## 2017-03-21 ENCOUNTER — Ambulatory Visit (INDEPENDENT_AMBULATORY_CARE_PROVIDER_SITE_OTHER): Payer: PPO | Admitting: *Deleted

## 2017-03-21 DIAGNOSIS — I639 Cerebral infarction, unspecified: Secondary | ICD-10-CM

## 2017-03-22 NOTE — Progress Notes (Signed)
Carelink Summary Report / Loop Recorder 

## 2017-03-23 LAB — CUP PACEART REMOTE DEVICE CHECK
Implantable Pulse Generator Implant Date: 20171206
MDC IDC SESS DTM: 20180306183606

## 2017-03-26 ENCOUNTER — Other Ambulatory Visit: Payer: Self-pay | Admitting: Internal Medicine

## 2017-03-27 ENCOUNTER — Telehealth: Payer: Self-pay | Admitting: Internal Medicine

## 2017-03-27 ENCOUNTER — Telehealth: Payer: Self-pay | Admitting: General Practice

## 2017-03-27 MED ORDER — RAMIPRIL 5 MG PO CAPS: 5.0000 mg | ORAL_CAPSULE | Freq: Every day | ORAL | 0 refills | Status: DC

## 2017-03-27 NOTE — Telephone Encounter (Signed)
Pt called requesting refill of ramipril (ALTACE) 5 MG capsule   to get him thorough to his appointment 5/1

## 2017-03-27 NOTE — Telephone Encounter (Signed)
I spoke with the patient and confirmed that Dr. Quay Burow is now his PCP since Dr. Linna Darner retired. Alex Bryant ([place]

## 2017-03-27 NOTE — Telephone Encounter (Signed)
RX sent

## 2017-03-30 LAB — CUP PACEART REMOTE DEVICE CHECK
Date Time Interrogation Session: 20180405184025
Implantable Pulse Generator Implant Date: 20171206

## 2017-03-30 NOTE — Progress Notes (Signed)
Carelink summary report received. Battery status OK. Normal device function. No new symptom episodes, tachy episodes, brady, or pause episodes. No new AF episodes. Monthly summary reports and ROV/PRN 

## 2017-04-02 ENCOUNTER — Ambulatory Visit: Payer: PPO | Admitting: Internal Medicine

## 2017-04-04 ENCOUNTER — Other Ambulatory Visit: Payer: Self-pay | Admitting: Internal Medicine

## 2017-04-04 ENCOUNTER — Telehealth: Payer: Self-pay | Admitting: Cardiology

## 2017-04-04 NOTE — Telephone Encounter (Signed)
Spoke w/ pt and requested that he send a manual transmission b/c his home monitor has not updated in at least 14 days.   

## 2017-04-16 ENCOUNTER — Encounter: Payer: Self-pay | Admitting: Internal Medicine

## 2017-04-16 ENCOUNTER — Ambulatory Visit (INDEPENDENT_AMBULATORY_CARE_PROVIDER_SITE_OTHER): Payer: PPO | Admitting: Internal Medicine

## 2017-04-16 VITALS — BP 132/74 | HR 78 | Temp 98.3°F | Resp 16 | Ht 72.0 in | Wt 204.0 lb

## 2017-04-16 DIAGNOSIS — E1159 Type 2 diabetes mellitus with other circulatory complications: Secondary | ICD-10-CM

## 2017-04-16 DIAGNOSIS — IMO0002 Reserved for concepts with insufficient information to code with codable children: Secondary | ICD-10-CM

## 2017-04-16 DIAGNOSIS — Z8673 Personal history of transient ischemic attack (TIA), and cerebral infarction without residual deficits: Secondary | ICD-10-CM

## 2017-04-16 DIAGNOSIS — G479 Sleep disorder, unspecified: Secondary | ICD-10-CM

## 2017-04-16 DIAGNOSIS — Z23 Encounter for immunization: Secondary | ICD-10-CM | POA: Diagnosis not present

## 2017-04-16 DIAGNOSIS — E1142 Type 2 diabetes mellitus with diabetic polyneuropathy: Secondary | ICD-10-CM | POA: Diagnosis not present

## 2017-04-16 DIAGNOSIS — I10 Essential (primary) hypertension: Secondary | ICD-10-CM | POA: Diagnosis not present

## 2017-04-16 DIAGNOSIS — Z0000 Encounter for general adult medical examination without abnormal findings: Secondary | ICD-10-CM

## 2017-04-16 DIAGNOSIS — Z125 Encounter for screening for malignant neoplasm of prostate: Secondary | ICD-10-CM

## 2017-04-16 DIAGNOSIS — E782 Mixed hyperlipidemia: Secondary | ICD-10-CM | POA: Diagnosis not present

## 2017-04-16 DIAGNOSIS — E1165 Type 2 diabetes mellitus with hyperglycemia: Secondary | ICD-10-CM

## 2017-04-16 NOTE — Assessment & Plan Note (Signed)
BP well controlled Current regimen effective and well tolerated Continue current medications at current doses  

## 2017-04-16 NOTE — Progress Notes (Signed)
Pre visit review using our clinic review tool, if applicable. No additional management support is needed unless otherwise documented below in the visit note. 

## 2017-04-16 NOTE — Assessment & Plan Note (Signed)
Taking ambien - no side effects Will continue

## 2017-04-16 NOTE — Progress Notes (Signed)
Subjective:    Patient ID: Alex Bryant, male    DOB: [birth year], 66 y.o.   MRN: [id number]  HPI Here for medicare wellness exam and annual physical exam.   I have personally reviewed and have noted 1.The patient's medical and social history 2.Their use of alcohol, tobacco or illicit drugs 3.Their current medications and supplements 4.The patient's functional ability including ADL's, fall risks, home safety                 risks and hearing or visual impairment. 5.Diet and physical activities 6.Evidence for depression or mood disorders 7.Care team reviewed  -  Neurology - Dr Erlinda Hong  He occasionally does not feel well - he feels funny.  He has felt lightheaded when he gets up in the morning.  There is no pattern to him not feeling well - he wonders if it is from his stroke.    His blood sugar this morning was 101, 107, highest after a meal 130.  He checked his BP the past three days and it was high, but he is not sure if his cuff is accurate.  His BP here is very good. .    He has lost about 20 lbs since his CVA.      Are there smokers in your home (other than you)? No  Risk Factors Exercise: does a lot of walking Dietary issues discussed: has decreased portions, lots of veges, salad, lean meats, avoid sweets, avoid bread  Cardiac risk factors: advanced age, hypertension, hyperlipidemia, h/o CVA  Depression Screen  Have you felt down, depressed or hopeless? No  Have you felt little interest or pleasure in doing things?  No  Activities of Daily Living In your present state of health, do you have any difficulty performing the following activities?:  Driving? No Managing money?  No Feeding yourself? No Getting from bed to chair? No Climbing a flight of stairs? No Preparing food and eating?: No Bathing or showering? No Getting dressed: No Getting to/using the toilet? No Moving around from place to place:  No In the past year have you fallen or had a near fall?: No   Are you sexually active?  yes  Do you have more than one partner?  no  Hearing Difficulties: No Do you often ask people to speak up or repeat themselves? No Do you experience ringing or noises in your ears? No Do you have difficulty understanding soft or whispered voices? No Vision:              Any change in vision:  No               Up to date with eye exam: yes Memory:  Do you feel that you have a problem with memory? No  Do you often misplace items? No  Do you feel safe at home?  Yes  Cognitive Testing  Alert, Orientated? Yes  Normal Appearance? Yes  Recall of three objects?  Yes  Can perform simple calculations? Yes  Displays appropriate judgment? Yes  Can read the correct time from a watch face? Yes   Advanced Directives have been discussed with the patient? Yes   Medications and allergies reviewed with patient and updated if appropriate.  Patient Active Problem List   Diagnosis Date Noted  . Diabetic neuropathy ([place]) 01/22/2017  . Cerebral thrombosis with cerebral infarction 11/20/2016  . History of CVA (cerebrovascular accident) without residual deficits 11/20/2016  . Plantar fasciitis of right foot 02/17/2014  .  Lumbosacral spondylosis without myelopathy 05/25/2013  . Essential hypertension 01/24/2011  . MEDIAL MENISCUS TEAR, RIGHT 07/06/2010  . Diabetes type 2, uncontrolled ([place]) 01/06/2009  . URTICARIA 01/06/2009  . Tubular adenoma of colon 08/26/2008  . Hyperlipidemia 02/24/2008  . History of gout 11/14/2007  . Sleep disorder 09/08/2007  . 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 aspirin 81 MG tablet Take 81 mg by mouth daily.      . clonazePAM (KLONOPIN) 0.5 MG tablet Take 0.25 mg by mouth at bedtime.     . clopidogrel (PLAVIX) 75 MG tablet Take 1 tablet (75 mg total) by mouth daily.  90 tablet 1  . colchicine 0.6 MG tablet Take 0.6 mg by mouth See admin instructions. Once a day beginning at onset of gout symptoms (as needed)    . gabapentin (NEURONTIN) 300 MG capsule Take 300 mg by mouth 2 (two) times daily.   1  . glucose blood (FREESTYLE LITE) test strip 1 each by Other route 2 (two) times daily. Use to check blood sugars twice a day. Dx E11.9 100 each 3  . glucose blood test strip 1 each by Other route as needed. Use as instructed     . Lancets (FREESTYLE) lancets 1 each by Other route 2 (two) times daily. Use to help check blood sugar twice a day. Dx E11.9 100 each 3  . metFORMIN (GLUCOPHAGE) 1000 MG tablet Take 1 tablet (1,000 mg total) by mouth 2 (two) times daily with a meal. 180 tablet 3  . Omega-3 Fatty Acids (FISH OIL) 1000 MG CAPS Take 1 capsule by mouth daily.     . ramipril (ALTACE) 5 MG capsule Take 1 capsule (5 mg total) by mouth daily. 30 capsule 0  . simvastatin (ZOCOR) 20 MG tablet TAKE 1 TABLET BY MOUTH DAILY 90 tablet 1  . zolpidem (AMBIEN) 5 MG tablet TAKE 1 TABLET BY MOUTH AT BEDTIME AS NEEDED FOR SLEEP 30 tablet 0   No current facility-administered medications on file prior to visit.     Past Medical History:  Diagnosis Date  . Diabetes mellitus    AODM  . Gout   . Hyperlipidemia   . Hypertension   . Stroke ([place])   . Tick bite [id number]   Erlichiosis;Eulis Canner MD, ID    Past Surgical History:  Procedure Laterality Date  . COLONOSCOPY    . COLONOSCOPY W/ POLYPECTOMY  2010   Dr Olevia Perches  . EP IMPLANTABLE DEVICE N/A 11/21/2016   Procedure: Loop Recorder Insertion;  Surgeon: Thompson Grayer, MD;  Location: [facility];  Service: Cardiovascular;  Laterality: N/A;  . ESI  2014   @L5 -S1 X3   . FEMORAL HERNIA REPAIR     age 74  . fx right face     plating & pinning of maxilla  . lumbosacral fusion  10/17/2013   L5-S1 ; [place]  . POLYPECTOMY  2015   Dr Olevia Perches  . TEE WITHOUT CARDIOVERSION N/A 11/21/2016   Procedure: TRANSESOPHAGEAL  ECHOCARDIOGRAM (TEE);  Surgeon: Larey Dresser, MD;  Location: [facility];  Service: Cardiovascular;  Laterality: N/A;    Social History   Social History  . Marital status: Married    Spouse name: N/A  . Number of children: N/A  . Years of education: N/A   Social History Main Topics  . Smoking status: Former Smoker  Quit date: 12/17/1980  . Smokeless tobacco: Never Used     Comment: smoked 1966- 1982, up to < 5 cigarettes / day  . Alcohol use 3.6 oz/week    6 Shots of liquor per week     Comment:  socially  . Drug use: No  . Sexual activity: Not Asked   Other Topics Concern  . None   Social History Narrative  . None    Family History  Problem Relation Age of Onset  . Heart attack Father 35  . Hypertension Mother   . Stroke Mother     X73  . Diabetes Sister     "pre Diabetes"  . Stroke Maternal Uncle   . Cancer Neg Hx   . Colon cancer Neg Hx   . Rectal cancer Neg Hx   . Stomach cancer Neg Hx     Review of Systems  Constitutional: Negative for appetite change, chills and fever.  HENT: Negative for hearing loss.   Eyes: Negative for visual disturbance.  Respiratory: Negative for cough, shortness of breath and wheezing.   Cardiovascular: Negative for chest pain, palpitations and leg swelling.  Gastrointestinal: Negative for abdominal pain, blood in stool, constipation, diarrhea and nausea.       No gerd  Genitourinary: Negative for dysuria and hematuria.  Musculoskeletal: Negative for arthralgias and back pain.  Skin: Negative for color change and rash.  Neurological: Positive for light-headedness and headaches (occasional). Negative for dizziness.  Psychiatric/Behavioral: Negative for dysphoric mood. The patient is not nervous/anxious.        Objective:   Vitals:   04/16/17 1456  BP: 132/74  Pulse: 78  Resp: 16  Temp: 98.3 F (36.8 C)   Filed Weights   04/16/17 1456  Weight: 204 lb (92.5 kg)   Body mass index is 27.67 kg/m.  Wt Readings from  Last 3 Encounters:  04/16/17 204 lb (92.5 kg)  01/07/17 209 lb 6.4 oz (95 kg)  12/03/16 210 lb (95.3 kg)     Physical Exam Constitutional: He appears well-developed and well-nourished. No distress.  HENT:  Head: Normocephalic and atraumatic.  Right Ear: External ear normal.  Left Ear: External ear normal.  Mouth/Throat: Oropharynx is clear and moist.  Normal ear canals and TM b/l  Eyes: Conjunctivae and EOM are normal.  Neck: Neck supple. No tracheal deviation present. No thyromegaly present.  No carotid bruit  Cardiovascular: Normal rate, regular rhythm, normal heart sounds and intact distal pulses.   No murmur heard. Pulmonary/Chest: Effort normal and breath sounds normal. No respiratory distress. He has no wheezes. He has no rales.  Abdominal: Soft. Bowel sounds are normal. He exhibits no distension. There is no tenderness.  Genitourinary: deferred  Musculoskeletal: He exhibits no edema.  Lymphadenopathy:   He has no cervical adenopathy.  Skin: Skin is warm and dry. He is not diaphoretic.  Psychiatric: He has a normal mood and affect. His behavior is normal.       Assessment & Plan:   Wellness Exam: Immunizations  prevnar today, Td  Due - will check with insurance, discussed shingrix Colonoscopy   Up to date  Eye exam  Up to date  Hearing loss    none Memory concerns/difficulties     none Independent of ADLs  fully Stressed the importance of regular exercise   Patient received copy of preventative screening tests/immunizations recommended for the next 5-10 years.    Physical exam: Screening blood work   ordered Immunizations   Td and  prevnar due, discussed shingrix Colonoscopy   Up to date  Eye exams  Up to date  EKG done 11/2016 - not indicated today Exercise  - regular Weight - has lost 20 lbs in past few months Skin    No concerns Substance abuse   none  See Problem List for Assessment and Plan of chronic medical problems.   Fu in 6 months

## 2017-04-16 NOTE — Assessment & Plan Note (Signed)
Check lipid panel  Continue daily statin Regular exercise and healthy diet encouraged  

## 2017-04-16 NOTE — Assessment & Plan Note (Signed)
Likely more from prior back surgery - less likely from diabetes Continue neurontin

## 2017-04-16 NOTE — Patient Instructions (Addendum)
Alex Bryant , Thank you for taking time to come for your Medicare Wellness Visit. I appreciate your ongoing commitment to your health goals. Please review the following plan we discussed and let me know if I can assist you in the future.   These are the goals we discussed: Goals    None      This is a list of the screening recommended for you and due dates:  Health Maintenance  Topic Date Due  . Tetanus Vaccine  11/20/1968  . Pneumonia vaccines (1 of 2 - PCV13) today  . Eye exam for diabetics  11/19/2015  . Hemoglobin A1C  05/21/2017  . Flu Shot  07/17/2017  . Complete foot exam   04/16/2018  . Colon Cancer Screening  10/21/2019  .  Hepatitis C: One time screening is recommended by Center for Disease Control  (CDC) for  adults born from [birth year] through 1967.   Completed     Test(s) ordered today. Your results will be released to [facility] (or called to you) after review, usually within 72hours after test completion. If any changes need to be made, you will be notified at that same time.  All other Health Maintenance issues reviewed.   All recommended immunizations and age-appropriate screenings are up-to-date or discussed.  Prevnar pneumonia immunization administered today.   Medications reviewed and updated.  No changes recommended at this time.   Please followup in 6 months    Health Maintenance, Male A healthy lifestyle and preventive care is important for your health and wellness. Ask your health care provider about what schedule of regular examinations is right for you. What should I know about weight and diet?  Eat a Healthy Diet  Eat plenty of vegetables, fruits, whole grains, low-fat dairy products, and lean protein.  Do not eat a lot of foods high in solid fats, added sugars, or salt. Maintain a Healthy Weight  Regular exercise can help you achieve or maintain a healthy weight. You should:  Do at least 150 minutes of exercise each week. The exercise should increase  your heart rate and make you sweat (moderate-intensity exercise).  Do strength-training exercises at least twice a week. Watch Your Levels of Cholesterol and Blood Lipids  Have your blood tested for lipids and cholesterol every 5 years starting at 68 years of age. If you are at high risk for heart disease, you should start having your blood tested when you are 68 years old. You may need to have your cholesterol levels checked more often if:  Your lipid or cholesterol levels are high.  You are older than 68 years of age.  You are at high risk for heart disease. What should I know about cancer screening? Many types of cancers can be detected early and may often be prevented. Lung Cancer  You should be screened every year for lung cancer if:  You are a current smoker who has smoked for at least 30 years.  You are a former smoker who has quit within the past 15 years.  Talk to your health care provider about your screening options, when you should start screening, and how often you should be screened. Colorectal Cancer  Routine colorectal cancer screening usually begins at 68 years of age and should be repeated every 5-10 years until you are 68 years old. You may need to be screened more often if early forms of precancerous polyps or small growths are found. Your health care provider may recommend screening at an earlier  age if you have risk factors for colon cancer.  Your health care provider may recommend using home test kits to check for hidden blood in the stool.  A small camera at the end of a tube can be used to examine your colon (sigmoidoscopy or colonoscopy). This checks for the earliest forms of colorectal cancer. Prostate and Testicular Cancer  Depending on your age and overall health, your health care provider may do certain tests to screen for prostate and testicular cancer.  Talk to your health care provider about any symptoms or concerns you have about testicular or  prostate cancer. Skin Cancer  Check your skin from head to toe regularly.  Tell your health care provider about any new moles or changes in moles, especially if:  There is a change in a mole's size, shape, or color.  You have a mole that is larger than a pencil eraser.  Always use sunscreen. Apply sunscreen liberally and repeat throughout the day.  Protect yourself by wearing long sleeves, pants, a wide-brimmed hat, and sunglasses when outside. What should I know about heart disease, diabetes, and high blood pressure?  If you are 62-43 years of age, have your blood pressure checked every 3-5 years. If you are 47 years of age or older, have your blood pressure checked every year. You should have your blood pressure measured twice-once when you are at a hospital or clinic, and once when you are not at a hospital or clinic. Record the average of the two measurements. To check your blood pressure when you are not at a hospital or clinic, you can use:  An automated blood pressure machine at a pharmacy.  A home blood pressure monitor.  Talk to your health care provider about your target blood pressure.  If you are between 67-42 years old, ask your health care provider if you should take aspirin to prevent heart disease.  Have regular diabetes screenings by checking your fasting blood sugar level.  If you are at a normal weight and have a low risk for diabetes, have this test once every three years after the age of 30.  If you are overweight and have a high risk for diabetes, consider being tested at a younger age or more often.  A one-time screening for abdominal aortic aneurysm (AAA) by ultrasound is recommended for men aged 20-75 years who are current or former smokers. What should I know about preventing infection? Hepatitis B  If you have a higher risk for hepatitis B, you should be screened for this virus. Talk with your health care provider to find out if you are at risk for  hepatitis B infection. Hepatitis C  Blood testing is recommended for:  Everyone born from [birth year] through 1967.  Anyone with known risk factors for hepatitis C. Sexually Transmitted Diseases (STDs)  You should be screened each year for STDs including gonorrhea and chlamydia if:  You are sexually active and are younger than 68 years of age.  You are older than 68 years of age and your health care provider tells you that you are at risk for this type of infection.  Your sexual activity has changed since you were last screened and you are at an increased risk for chlamydia or gonorrhea. Ask your health care provider if you are at risk.  Talk with your health care provider about whether you are at high risk of being infected with HIV. Your health care provider may recommend a prescription medicine to help prevent  HIV infection. What else can I do?  Schedule regular health, dental, and eye exams.  Stay current with your vaccines (immunizations).  Do not use any tobacco products, such as cigarettes, chewing tobacco, and e-cigarettes. If you need help quitting, ask your health care provider.  Limit alcohol intake to no more than 2 drinks per day. One drink equals 12 ounces of beer, 5 ounces of wine, or 1 ounces of hard liquor.  Do not use street drugs.  Do not share needles.  Ask your health care provider for help if you need support or information about quitting drugs.  Tell your health care provider if you often feel depressed.  Tell your health care provider if you have ever been abused or do not feel safe at home. This information is not intended to replace advice given to you by your health care provider. Make sure you discuss any questions you have with your health care provider. Document Released: 05/31/2008 Document Revised: 08/01/2016 Document Reviewed: 09/06/2015 Elsevier Interactive Patient Education  2017 [employer].

## 2017-04-16 NOTE — Assessment & Plan Note (Signed)
Following with neuro Continue asa, plavix, statin Good BP controlled

## 2017-04-16 NOTE — Assessment & Plan Note (Signed)
Check a1c Low sugar / carb diet Stressed regular exercise, keeping weight down/weight loss Hopefully will be able to decrease metformin

## 2017-04-19 ENCOUNTER — Other Ambulatory Visit (INDEPENDENT_AMBULATORY_CARE_PROVIDER_SITE_OTHER): Payer: PPO

## 2017-04-19 DIAGNOSIS — Z0000 Encounter for general adult medical examination without abnormal findings: Secondary | ICD-10-CM

## 2017-04-19 DIAGNOSIS — E1142 Type 2 diabetes mellitus with diabetic polyneuropathy: Secondary | ICD-10-CM

## 2017-04-19 DIAGNOSIS — I10 Essential (primary) hypertension: Secondary | ICD-10-CM

## 2017-04-19 DIAGNOSIS — E782 Mixed hyperlipidemia: Secondary | ICD-10-CM | POA: Diagnosis not present

## 2017-04-19 DIAGNOSIS — Z125 Encounter for screening for malignant neoplasm of prostate: Secondary | ICD-10-CM

## 2017-04-19 DIAGNOSIS — IMO0002 Reserved for concepts with insufficient information to code with codable children: Secondary | ICD-10-CM

## 2017-04-19 DIAGNOSIS — E1159 Type 2 diabetes mellitus with other circulatory complications: Secondary | ICD-10-CM | POA: Diagnosis not present

## 2017-04-19 DIAGNOSIS — E1165 Type 2 diabetes mellitus with hyperglycemia: Secondary | ICD-10-CM | POA: Diagnosis not present

## 2017-04-19 LAB — COMPREHENSIVE METABOLIC PANEL
ALBUMIN: 4.3 g/dL (ref 3.5–5.2)
ALT: 13 U/L (ref 0–53)
AST: 14 U/L (ref 0–37)
Alkaline Phosphatase: 53 U/L (ref 39–117)
BILIRUBIN TOTAL: 0.6 mg/dL (ref 0.2–1.2)
BUN: 19 mg/dL (ref 6–23)
CALCIUM: 9.5 mg/dL (ref 8.4–10.5)
CO2: 27 meq/L (ref 19–32)
Chloride: 107 mEq/L (ref 96–112)
Creatinine, Ser: 1.02 mg/dL (ref 0.40–1.50)
GFR: 77.33 mL/min (ref >60.00)
Glucose, Bld: 135 mg/dL — ABNORMAL HIGH (ref 70–99)
Potassium: 4.4 mEq/L (ref 3.5–5.1)
Sodium: 140 mEq/L (ref 135–145)
Total Protein: 6.5 g/dL (ref 6.0–8.3)

## 2017-04-19 LAB — CBC WITH DIFFERENTIAL/PLATELET
Basophils Absolute: 0.1 10*3/uL (ref 0.0–0.1)
Basophils Relative: 1.2 % (ref 0.0–3.0)
EOS PCT: 10.6 % — AB (ref 0.0–5.0)
Eosinophils Absolute: 0.5 10*3/uL (ref 0.0–0.7)
HEMATOCRIT: 39.2 % (ref 39.0–52.0)
Hemoglobin: 13.7 g/dL (ref 13.0–17.0)
LYMPHS ABS: 1.3 10*3/uL (ref 0.7–4.0)
Lymphocytes Relative: 27.2 % (ref 12.0–46.0)
MCHC: 35 g/dL (ref 30.0–36.0)
MCV: 87.3 fl (ref 78.0–100.0)
MONO ABS: 0.4 10*3/uL (ref 0.1–1.0)
Monocytes Relative: 8.7 % (ref 3.0–12.0)
Neutro Abs: 2.4 10*3/uL (ref 1.4–7.7)
Neutrophils Relative %: 52.3 % (ref 43.0–77.0)
PLATELETS: 205 10*3/uL (ref 150.0–400.0)
RBC: 4.49 Mil/uL (ref 4.22–5.81)
RDW: 13.6 % (ref 11.5–15.5)
WBC: 4.7 10*3/uL (ref 4.0–10.5)

## 2017-04-19 LAB — TSH: TSH: 1.24 u[IU]/mL (ref 0.35–4.50)

## 2017-04-19 LAB — LIPID PANEL
CHOLESTEROL: 126 mg/dL (ref 0–200)
HDL: 42.6 mg/dL (ref >39.00)
LDL Cholesterol: 62 mg/dL (ref 0–99)
NonHDL: 83.53
TRIGLYCERIDES: 106 mg/dL (ref 0.0–149.0)
Total CHOL/HDL Ratio: 3
VLDL: 21.2 mg/dL (ref 0.0–40.0)

## 2017-04-19 LAB — PSA, MEDICARE: PSA: 1.04 ng/mL (ref 0.10–4.00)

## 2017-04-19 LAB — HEMOGLOBIN A1C: HEMOGLOBIN A1C: 6.4 % (ref 4.6–6.5)

## 2017-04-20 ENCOUNTER — Encounter: Payer: Self-pay | Admitting: Internal Medicine

## 2017-04-22 ENCOUNTER — Ambulatory Visit (INDEPENDENT_AMBULATORY_CARE_PROVIDER_SITE_OTHER): Payer: PPO | Admitting: *Deleted

## 2017-04-22 DIAGNOSIS — I639 Cerebral infarction, unspecified: Secondary | ICD-10-CM

## 2017-04-23 NOTE — Progress Notes (Signed)
Carelink Summary Report / Loop Recorder 

## 2017-04-24 ENCOUNTER — Telehealth: Payer: Self-pay | Admitting: Cardiology

## 2017-04-24 NOTE — Telephone Encounter (Signed)
Spoke w/ pt and requested that he send a manual transmission b/c his home monitor has not updated in at least 14 days.   

## 2017-04-29 ENCOUNTER — Other Ambulatory Visit: Payer: Self-pay | Admitting: Internal Medicine

## 2017-05-02 ENCOUNTER — Encounter: Payer: Self-pay | Admitting: Internal Medicine

## 2017-05-06 ENCOUNTER — Other Ambulatory Visit: Payer: Self-pay | Admitting: Internal Medicine

## 2017-05-06 LAB — CUP PACEART REMOTE DEVICE CHECK
Date Time Interrogation Session: 20180505193920
Implantable Pulse Generator Implant Date: 20171206

## 2017-05-20 ENCOUNTER — Ambulatory Visit (INDEPENDENT_AMBULATORY_CARE_PROVIDER_SITE_OTHER): Payer: PPO | Admitting: *Deleted

## 2017-05-20 DIAGNOSIS — I639 Cerebral infarction, unspecified: Secondary | ICD-10-CM | POA: Diagnosis not present

## 2017-05-21 NOTE — Progress Notes (Signed)
Carelink Summary Report / Loop Recorder 

## 2017-05-22 LAB — CUP PACEART REMOTE DEVICE CHECK
Date Time Interrogation Session: 20180604201457
MDC IDC PG IMPLANT DT: 20171206

## 2017-06-06 ENCOUNTER — Telehealth: Payer: Self-pay | Admitting: Cardiology

## 2017-06-06 NOTE — Telephone Encounter (Signed)
LMOVM requesting that pt send manual transmission b/c home monitor has not updated in at least 14 days.    

## 2017-06-10 ENCOUNTER — Ambulatory Visit (INDEPENDENT_AMBULATORY_CARE_PROVIDER_SITE_OTHER): Payer: PPO | Admitting: Neurology

## 2017-06-10 ENCOUNTER — Encounter: Payer: Self-pay | Admitting: Neurology

## 2017-06-10 VITALS — BP 134/76 | HR 55 | Ht 72.0 in | Wt 200.0 lb

## 2017-06-10 DIAGNOSIS — E1142 Type 2 diabetes mellitus with diabetic polyneuropathy: Secondary | ICD-10-CM

## 2017-06-10 DIAGNOSIS — E1159 Type 2 diabetes mellitus with other circulatory complications: Secondary | ICD-10-CM

## 2017-06-10 DIAGNOSIS — E782 Mixed hyperlipidemia: Secondary | ICD-10-CM

## 2017-06-10 DIAGNOSIS — I63413 Cerebral infarction due to embolism of bilateral middle cerebral arteries: Secondary | ICD-10-CM

## 2017-06-10 DIAGNOSIS — I10 Essential (primary) hypertension: Secondary | ICD-10-CM | POA: Diagnosis not present

## 2017-06-10 MED ORDER — GABAPENTIN 400 MG PO CAPS: ORAL_CAPSULE | ORAL | 3 refills | Status: DC

## 2017-06-10 NOTE — Patient Instructions (Addendum)
-   continueplavix for stroke prevention - stop ASA.  - continue simvastatin for storke prevention - Follow up with your primary care physician for stroke risk factor modification. Recommend maintain blood pressure goal <130/80, diabetes with hemoglobin A1c goal below 7.0% and lipids with LDL cholesterol goal below 70 mg/dL.  - check BP and glucose at home and record - continue loop recorder monitoring - health diet and regular exercise - will start gabapentin 400mg  twice a day for 7 days and then 800mg  twice a day for neuropathy, if you experenced drowsy sleepy side effects, please let us know and we will need to adjust dosage. - follow up in 6 months.

## 2017-06-10 NOTE — Progress Notes (Signed)
STROKE NEUROLOGY FOLLOW UP NOTE  NAME: Alex Bryant DOB: February 19, 1951  REASON FOR VISIT: stroke follow up HISTORY FROM: pt and chart  Today we had the pleasure of seeing Alex Bryant in follow-up at our Neurology Clinic. Pt was accompanied by no one.   History Summary [name] is a 66 y.o. male with history of diabetes, hypertension, hypercholesterolemia and gout was admitted on 11/19/16 for right arm weakness, numbness and dyscoordination. 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 Symptoms resolved on admission. However, second day of admission, pt had another episode of right arm weakness and numbness lasted about 30 min and resolved. MRI limited showed additional 2 punctate infarcts at bilateral MCA territories, concerning for embolic pattern. CTA head and neck, TTE, TEE all unremarkable. LDL 62 and A1C 7.8. Loop recorder placed. He was discharged home with ASA and plavix and continued on zocor.    01/07/17 follow up - the patient has been doing well. No recurrent stroke like symptoms. No other complaints. He feels good and has lost 14 pounds during the time. Continued on dual antiplatelet and Zocor without side effect. BP 125/76. He stated that he is over much better controlled at home, height is 140, average 90s, but this morning only and 40s.   Interval History During the interval time, pt has been doing well. Still on DAPT, did not stop ASA as instructed last time. His lipid panel showed improvement, LDL stable and TG now normalized. Loop recorder showed no afib. His EMG showed peripheral neuropathy and we started him on gabapentin 300mg  bid but pt stated not effective, and he stopped on his own. But he continued to have b/l sole numbness and tingling. BP 134/76 today.  REVIEW OF SYSTEMS: Full 14 system review of systems performed and notable only for those listed below and in HPI above, all others are negative:  Constitutional:   Cardiovascular:    Ear/Nose/Throat:   Skin:  Eyes:   Respiratory:   Gastroitestinal:   Genitourinary:  Hematology/Lymphatic:   Endocrine:  Musculoskeletal:   Allergy/Immunology:   Neurological:   Psychiatric:  Sleep:   The following represents the patient's updated allergies and side effects list: Allergies  Allergen Reactions  . Oxycodone Anxiety    [name], confused, and constipation    The neurologically relevant items on the patient's problem list were reviewed on today's visit.  Neurologic Examination  A problem focused neurological exam (12 or more points of the single system neurologic examination, vital signs counts as 1 point, cranial nerves count for 8 points) was performed.  Blood pressure 134/76, pulse (!) 55, height 6' (1.829 m), weight 200 lb (90.7 kg).  General - Well nourished, well developed, in no apparent distress.  Ophthalmologic - Sharp disc margins OU.   Cardiovascular - Regular rate and rhythm with no murmur.  Mental Status -  Level of arousal and orientation to time, place, and person were intact. Language including expression, naming, repetition, comprehension was assessed and found intact. Fund of Knowledge was assessed and was intact.  Cranial Nerves II - XII - II - Visual field intact OU. III, IV, VI - Extraocular movements intact. V - Facial sensation intact bilaterally. VII - Facial movement intact bilaterally. VIII - Hearing & vestibular intact bilaterally. X - Palate elevates symmetrically. XI - Chin turning & shoulder shrug intact bilaterally. XII - Tongue protrusion intact.  Motor Strength - The patient's strength was normal in all extremities and pronator drift was absent.  Bulk was normal and fasciculations were absent.   Motor Tone - Muscle tone was assessed at the neck and appendages and was normal.  Reflexes - The patient's reflexes were 1+ in all extremities and he had no pathological reflexes.  Sensory - Light touch, temperature/pinprick,  vibration and proprioception, and Romberg testing were assessed and were normal.    Coordination - The patient had normal movements in the hands and feet with no ataxia or dysmetria.  Tremor was absent.  Gait and Station - The patient's transfers, posture, gait, station, and turns were observed as normal.   Data reviewed: I personally reviewed the images and agree with the radiology interpretations.  Ct Angio Head W Or Wo Contrast Ct Angio Neck W Or Wo Contrast Date: 11/19/2016   11/20/2016 11:39  ADDENDUM: Dr. Erlinda Hong reports patient has no neck pain to suggest acute vertebral dissection. The described proximal left vertebral narrowings could be atheromatous instead of a limited segment dissection.  11/19/2016 15:04  1. No emergent large vessel occlusion. 2. 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 3. Intracranial atherosclerosis with mild right supraclinoid ICA narrowing.   Mr Brain Wo Contrast 11/19/2016 1. Punctate acute/early subacute infarct in left paramedian parietal lobe. No intracranial hemorrhage. 2. Mild chronic microvascular ischemic changes and parenchymal volume loss. 3. Mild to moderate paranasal sinus disease.   Ct Head Code Stroke W/o Cm 11/19/2016 1. Normal CT appearance the brain. 2. ASPECTS is 10/10   2-D echocardiogram - Left ventricle: The cavity size was normal. There was mild concentric hypertrophy. Systolic function was normal. The estimated ejection fraction was in the range of 60% to 65%. Wall motion was normal; there were no regional wall motion abnormalities. There was an increased relative contribution of atrial contraction to ventricular filling. Doppler parameters are consistent with abnormal left ventricular relaxation (grade 1 diastolic dysfunction). - Aorta: Aortic root dimension: 39 mm (ED). - Aortic root: The aortic root was mildly dilated.  Mr Brain Limited Wo  Contrast 11/20/2016 IMPRESSION: At least 2 additional punctate foci of acute/ early subacute infarction within the left posterior frontal and parietal lobe. Probable additional punctate focus of infarction in the right parietal white matter. Multiple vascular territories suggests embolic etiology. No intracranial hemorrhage.   TEE -  Normal LV size and thickness. EF 55-60% with normal wall motion. Normal RV size and systolic function. Normal left atrial size, no LA appendage thrombus. Normal right atrium. No ASD/PFO by color doppler. No significant mitral regurgitation. No significant tricuspid regurgitation. Trivial PI. Trileaflet aortic valve with no significant regurgitation or stenosis. Normal caliber aorta with grade IV plaque in 1 area of the descending thoracic aorta, nothing concerning in arch.  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 EMG evaluation of both lower extremities shows mild chronic distal signs of denervation consistent with the diagnosis of peripheral neuropathy. No evidence of an ongoing lumbosacral radiculopathy is seen on either side.  Component     Latest Ref Rng & Units 11/20/2016  Cholesterol     0 - 200 mg/dL 147  Triglycerides     <150 mg/dL 259 (H)  HDL Cholesterol     >40 mg/dL 33 (L)  Total CHOL/HDL Ratio     RATIO 4.5  VLDL     0 - 40 mg/dL 52 (H)  LDL (calc)     0 - 99 mg/dL 62  Hemoglobin A1C     4.8 - 5.6 % 7.8 (H)  Mean Plasma Glucose     mg/dL 177   Component     Latest Ref Rng & Units 04/19/2017  Cholesterol     0 - 200 mg/dL 126  Triglycerides     0.0 - 149.0 mg/dL 106.0  HDL Cholesterol     >39.00 mg/dL 42.60  VLDL     0.0 - 40.0 mg/dL 21.2  LDL (calc)     0 - 99 mg/dL 62  Total CHOL/HDL Ratio      3  NonHDL      83.53  Hemoglobin A1C     4.6 - 6.5 % 6.4  TSH     0.35 - 4.50 uIU/mL 1.24    Assessment: As you may recall,  he is a 66 y.o. Caucasian male with PMH of diabetes, hypertension, hyperlipidemia and gout was admitted on 11/19/16 for left paramedian parietal lobe punctate infarcts. Symptoms resolved on admission. However, second day of admission, pt had another episode of similar symptoms lasted about 30 min and resolved. MRI limited showed additional 2 punctate infarcts at bilateral MCA territories, concerning for embolic pattern. CTA head and neck, TTE, TEE all unremarkable. LDL 62 and A1C 7.8. Loop recorder placed. He was discharged home with ASA and plavix and continued on zocor. During the interval time, he still on DAPT now. TG much improved. Still has b/l sole numbness, EMG/NCS consistent with peripheral neuropathy. Had gabapentin 300mg  bid but not effective, also no side effects.   Plan:  - continueplavix for stroke prevention and stop ASA.  - continue simvastatin for storke prevention - Follow up with your primary care physician for stroke risk factor modification. Recommend maintain blood pressure goal <130/80, diabetes with hemoglobin A1c goal below 7.0% and lipids with LDL cholesterol goal below 70 mg/dL.  - check BP and glucose at home and record - continue loop recorder monitoring - health diet and regular exercise - will start gabapentin 400mg  twice a day for 7 days and then 800mg  twice a day for neuropathy. Will adjust dosage if side effects - follow up in 6 months.   No orders of the defined types were placed in this encounter.   Meds ordered this encounter  Medications  . gabapentin (NEURONTIN) 400 MG capsule    Sig: Take 400mg  bid for 7 days and then 800mg  bid.    Dispense:  120 capsule    Refill:  3    Patient Instructions  - continueplavix for stroke prevention - stop ASA.  - continue simvastatin for storke prevention - Follow up with your primary care physician for stroke risk factor modification. Recommend maintain blood pressure goal <130/80, diabetes with hemoglobin A1c goal  below 7.0% and lipids with LDL cholesterol goal below 70 mg/dL.  - check BP and glucose at home and record - continue loop recorder monitoring - health diet and regular exercise - will start gabapentin 400mg  twice a day for 7 days and then 800mg  twice a day for neuropathy, if you experenced drowsy sleepy side effects, please let us know and we will need to adjust dosage. - follow up in 6 months.   Rosalin Hawking, MD PhD [facility] [address], [place], [place] [address] [phone]

## 2017-06-20 ENCOUNTER — Ambulatory Visit (INDEPENDENT_AMBULATORY_CARE_PROVIDER_SITE_OTHER): Payer: PPO | Admitting: *Deleted

## 2017-06-20 DIAGNOSIS — I639 Cerebral infarction, unspecified: Secondary | ICD-10-CM | POA: Diagnosis not present

## 2017-06-21 NOTE — Progress Notes (Signed)
Carelink Summary Report / Loop Recorder 

## 2017-06-24 LAB — CUP PACEART REMOTE DEVICE CHECK
Implantable Pulse Generator Implant Date: 20171206
MDC IDC SESS DTM: 20180704200948

## 2017-06-27 ENCOUNTER — Telehealth: Payer: Self-pay | Admitting: Cardiology

## 2017-06-27 NOTE — Telephone Encounter (Signed)
LMOVM requesting that pt send manual transmission b/c home monitor has not updated in at least 14 days.    

## 2017-07-03 ENCOUNTER — Encounter: Payer: Self-pay | Admitting: Cardiology

## 2017-07-08 ENCOUNTER — Other Ambulatory Visit: Payer: Self-pay | Admitting: Internal Medicine

## 2017-07-08 NOTE — Telephone Encounter (Signed)
[place] Controlled Substance Database checked. Okay to fill RX. RX last filled on 06/07/17

## 2017-07-18 ENCOUNTER — Telehealth: Payer: Self-pay | Admitting: Cardiology

## 2017-07-18 NOTE — Telephone Encounter (Signed)
LMOVM requesting that pt send manual transmission b/c home monitor has not updated in at least 7 days.    

## 2017-07-19 ENCOUNTER — Ambulatory Visit (INDEPENDENT_AMBULATORY_CARE_PROVIDER_SITE_OTHER): Payer: PPO | Admitting: *Deleted

## 2017-07-19 DIAGNOSIS — I639 Cerebral infarction, unspecified: Secondary | ICD-10-CM

## 2017-07-22 ENCOUNTER — Other Ambulatory Visit: Payer: Self-pay | Admitting: Internal Medicine

## 2017-07-23 NOTE — Progress Notes (Signed)
Carelink Summary Report / Loop Recorder 

## 2017-07-29 ENCOUNTER — Other Ambulatory Visit: Payer: Self-pay | Admitting: Internal Medicine

## 2017-07-30 LAB — CUP PACEART REMOTE DEVICE CHECK
Date Time Interrogation Session: 20180803203948
MDC IDC PG IMPLANT DT: 20171206

## 2017-08-05 ENCOUNTER — Other Ambulatory Visit: Payer: Self-pay | Admitting: Internal Medicine

## 2017-08-05 NOTE — Telephone Encounter (Signed)
[place] Controlled Substance Database checked. Last filled on 07/08/17 

## 2017-08-06 NOTE — Telephone Encounter (Signed)
RX faxed to [facility] 

## 2017-08-20 ENCOUNTER — Ambulatory Visit (INDEPENDENT_AMBULATORY_CARE_PROVIDER_SITE_OTHER): Payer: PPO | Admitting: *Deleted

## 2017-08-20 DIAGNOSIS — M25552 Pain in left hip: Secondary | ICD-10-CM | POA: Diagnosis not present

## 2017-08-20 DIAGNOSIS — I639 Cerebral infarction, unspecified: Secondary | ICD-10-CM

## 2017-08-20 DIAGNOSIS — M545 Low back pain: Secondary | ICD-10-CM | POA: Diagnosis not present

## 2017-08-22 NOTE — Progress Notes (Signed)
Carelink Summary Report / Loop Recorder 

## 2017-08-25 LAB — CUP PACEART REMOTE DEVICE CHECK
Date Time Interrogation Session: 20180902213804
MDC IDC PG IMPLANT DT: 20171206

## 2017-09-02 ENCOUNTER — Telehealth: Payer: Self-pay | Admitting: Internal Medicine

## 2017-09-02 ENCOUNTER — Other Ambulatory Visit: Payer: Self-pay | Admitting: Internal Medicine

## 2017-09-02 MED ORDER — ZOLPIDEM TARTRATE 5 MG PO TABS: 5.0000 mg | ORAL_TABLET | Freq: Every evening | ORAL | 0 refills | Status: DC | PRN

## 2017-09-02 NOTE — Telephone Encounter (Signed)
[place] Controlled Substance Database checked. Last filled on 08/06/17

## 2017-09-02 NOTE — Telephone Encounter (Signed)
Notified pt refill has been called into [facility]. Had to leave on pharmacy vm....Alex Bryant

## 2017-09-02 NOTE — Telephone Encounter (Signed)
Pt called for a refill of his zolpidem (AMBIEN) 5 MG tablet  Has med fu on 04/2017 Please advise

## 2017-09-02 NOTE — Telephone Encounter (Signed)
Check [name] registry last filled 08/06/2017...Alex Bryant

## 2017-09-02 NOTE — Telephone Encounter (Signed)
Ok to fill 

## 2017-09-17 ENCOUNTER — Ambulatory Visit (INDEPENDENT_AMBULATORY_CARE_PROVIDER_SITE_OTHER): Payer: PPO | Admitting: *Deleted

## 2017-09-17 DIAGNOSIS — I639 Cerebral infarction, unspecified: Secondary | ICD-10-CM | POA: Diagnosis not present

## 2017-09-18 NOTE — Progress Notes (Signed)
Carelink Summary Report / Loop Recorder 

## 2017-09-19 LAB — CUP PACEART REMOTE DEVICE CHECK
Date Time Interrogation Session: 20181002220900
MDC IDC PG IMPLANT DT: 20171206

## 2017-10-09 ENCOUNTER — Other Ambulatory Visit: Payer: Self-pay | Admitting: Internal Medicine

## 2017-10-09 NOTE — Telephone Encounter (Signed)
RX faxed to [facility] 

## 2017-10-09 NOTE — Telephone Encounter (Signed)
Pt called regarding this , please advise  °

## 2017-10-09 NOTE — Telephone Encounter (Signed)
[place] Controlled Substance Database checked. Last filled on 09/02/17. Pt due for OV, note on RX

## 2017-10-10 DIAGNOSIS — J209 Acute bronchitis, unspecified: Secondary | ICD-10-CM | POA: Diagnosis not present

## 2017-10-10 DIAGNOSIS — F419 Anxiety disorder, unspecified: Secondary | ICD-10-CM | POA: Diagnosis not present

## 2017-10-10 DIAGNOSIS — I10 Essential (primary) hypertension: Secondary | ICD-10-CM | POA: Diagnosis not present

## 2017-10-10 DIAGNOSIS — R509 Fever, unspecified: Secondary | ICD-10-CM | POA: Diagnosis not present

## 2017-10-13 DIAGNOSIS — E119 Type 2 diabetes mellitus without complications: Secondary | ICD-10-CM | POA: Diagnosis not present

## 2017-10-13 DIAGNOSIS — J22 Unspecified acute lower respiratory infection: Secondary | ICD-10-CM | POA: Diagnosis not present

## 2017-10-17 ENCOUNTER — Ambulatory Visit (INDEPENDENT_AMBULATORY_CARE_PROVIDER_SITE_OTHER): Payer: PPO | Admitting: *Deleted

## 2017-10-17 DIAGNOSIS — I639 Cerebral infarction, unspecified: Secondary | ICD-10-CM

## 2017-10-18 NOTE — Progress Notes (Signed)
Carelink Summary Report / Loop Recorder 

## 2017-10-21 LAB — CUP PACEART REMOTE DEVICE CHECK
Date Time Interrogation Session: 20181101220916
MDC IDC PG IMPLANT DT: 20171206

## 2017-10-30 ENCOUNTER — Telehealth: Payer: Self-pay | Admitting: Cardiology

## 2017-10-30 NOTE — Telephone Encounter (Signed)
LMOVM requesting that pt send manual transmission b/c home monitor has not updated in at least 14 days.    

## 2017-11-06 ENCOUNTER — Other Ambulatory Visit: Payer: Self-pay | Admitting: Internal Medicine

## 2017-11-09 ENCOUNTER — Encounter: Payer: Self-pay | Admitting: Emergency Medicine

## 2017-11-09 ENCOUNTER — Other Ambulatory Visit: Payer: Self-pay | Admitting: Family Medicine

## 2017-11-09 ENCOUNTER — Telehealth: Payer: Self-pay | Admitting: Emergency Medicine

## 2017-11-09 MED ORDER — ZOLPIDEM TARTRATE 5 MG PO TABS
5.0000 mg | ORAL_TABLET | Freq: Every evening | ORAL | 0 refills | Status: DC | PRN
Start: 2017-11-09 — End: 2017-11-12

## 2017-11-09 NOTE — Progress Notes (Signed)
Called my Nurse team for refill. He needs his Ambien to sleep but has not set up appt. Will call in limited supply and instructed him to make appt Mon.

## 2017-11-09 NOTE — Telephone Encounter (Signed)
Patient calling for refill of Ambien medication. Medication was denied by PCP. Patient is due for 6 month follow up appointment.

## 2017-11-11 NOTE — Telephone Encounter (Signed)
Pt notified by CMA below that an OV is needed for further refill, no refills will be sent until OV is made

## 2017-11-12 ENCOUNTER — Telehealth: Payer: Self-pay | Admitting: Internal Medicine

## 2017-11-12 MED ORDER — ZOLPIDEM TARTRATE 5 MG PO TABS: 5.0000 mg | ORAL_TABLET | Freq: Every evening | ORAL | 0 refills | Status: DC | PRN

## 2017-11-12 NOTE — Telephone Encounter (Signed)
Pt called for a refill of his  zolpidem (AMBIEN) 5 MG tablet  [facility] called in a short term supply for him untik he can get an appt with [facility]  Appt set up for 12/3, he would like enough to get him through to his appt  Please advise

## 2017-11-12 NOTE — Telephone Encounter (Signed)
sent 

## 2017-11-18 ENCOUNTER — Ambulatory Visit (INDEPENDENT_AMBULATORY_CARE_PROVIDER_SITE_OTHER): Payer: PPO | Admitting: *Deleted

## 2017-11-18 ENCOUNTER — Ambulatory Visit: Payer: PPO | Admitting: Internal Medicine

## 2017-11-18 DIAGNOSIS — I639 Cerebral infarction, unspecified: Secondary | ICD-10-CM

## 2017-11-18 NOTE — Progress Notes (Signed)
Carelink Summary Report / Loop Recorder 

## 2017-11-20 NOTE — Progress Notes (Signed)
Subjective:    Patient ID: Alex Bryant, male    DOB: 14-Apr-1951, 66 y.o.   MRN: [id number]  HPI The patient is here for follow up.  Diabetes: He is taking his medication daily as prescribed. He is compliant with a diabetic diet. He is exercising regularly - walking. He monitors his sugars and they have been well controlled - today it was 113. He checks his feet daily and denies foot lesions. He is up-to-date with an ophthalmology examination.  Neuropathy:  He has seen neurology.  ? Nerve damage related to diabetes or prior back surgery.  He has numbness, but no pain.  He has tried gabapentin, but it did not help so he does not take it.   Hypertension: He is taking his medication daily. He is compliant with a low sodium diet.  He denies chest pain, palpitations, edema, shortness of breath and regular headaches. He is exercising regularly.  He does monitor his blood pressure at home occasionally - often around 135/72.    H/o CVA:  He is taking his statin and plavix daily.  His bp is lower at home and he is taking his medication daily.  He is eating healthy and walks daily.   Hyperlipidemia: He is taking his medication daily. He is compliant with a low fat/cholesterol diet. He is exercising regularly. He denies myalgias.   Sleep disorder:   He uses the [place] almost every night.  He can not stay asleep without it.  He denies side effects.   Medications and allergies reviewed with patient and updated if appropriate.  Patient Active Problem List   Diagnosis Date Noted  . Diabetic neuropathy ([place]) 01/22/2017  . Cerebral thrombosis with cerebral infarction 11/20/2016  . History of CVA (cerebrovascular accident) without residual deficits 11/20/2016  . 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 Plantar fasciitis of right foot 02/17/2014  . Lumbosacral spondylosis without myelopathy 05/25/2013  . Essential hypertension 01/24/2011  .  MEDIAL MENISCUS TEAR, RIGHT 07/06/2010  . Diabetes mellitus ([place]) 01/06/2009  . URTICARIA 01/06/2009  . Tubular adenoma of colon 08/26/2008  . Mixed hyperlipidemia 02/24/2008  . History of gout 11/14/2007  . Sleep disorder 09/08/2007  . 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 aspirin 81 MG tablet Take 81 mg by mouth daily.      . clopidogrel (PLAVIX) 75 MG tablet TAKE 1 TABLET(75 MG) BY MOUTH DAILY 90 tablet 1  . colchicine 0.6 MG tablet Take 0.6 mg by mouth See admin instructions. Once a day beginning at onset of gout symptoms (as needed)    . glucose blood test strip 1 each by Other route as needed. Use as instructed     . Lancets (FREESTYLE) lancets 1 each by Other route 2 (two) times daily. Use to help check blood sugar twice a day. Dx E11.9 100 each 3  . metFORMIN (GLUCOPHAGE) 1000 MG tablet Take 1 tablet (1,000 mg total) by mouth 2 (two) times daily with a meal. (Patient taking differently: Take 1,000 mg by mouth daily. ) 180 tablet 3  . Omega-3 Fatty Acids (FISH OIL) 1000 MG CAPS Take 1 capsule by mouth daily.     . ramipril (ALTACE) 5 MG capsule Take 1 capsule (5 mg total) by mouth daily. --- Office visit needed for further refills 90 capsule 0  . simvastatin (ZOCOR)  20 MG tablet TAKE 1 TABLET BY MOUTH DAILY 90 tablet 1   No current facility-administered medications on file prior to visit.     Past Medical History:  Diagnosis Date  . Diabetes mellitus    AODM  . Gout   . Hyperlipidemia   . Hypertension   . Stroke ([place])   . 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 COLONOSCOPY    . COLONOSCOPY W/ POLYPECTOMY  2010   Dr Olevia Perches  . EP IMPLANTABLE DEVICE N/A 11/21/2016   Procedure: Loop Recorder Insertion;  Surgeon: Thompson Grayer, MD;  Location: [facility];  Service: Cardiovascular;  Laterality: N/A;    . ESI  2014   @L5 -S1 X3   . FEMORAL HERNIA REPAIR     age 63  . fx right face     plating & pinning of maxilla  . lumbosacral fusion  10/17/2013   L5-S1 ; [place]  . POLYPECTOMY  2015   Dr Olevia Perches  . TEE WITHOUT CARDIOVERSION N/A 11/21/2016   Procedure: TRANSESOPHAGEAL ECHOCARDIOGRAM (TEE);  Surgeon: Larey Dresser, MD;  Location: [facility]COPY;  Service: Cardiovascular;  Laterality: N/A;    Social History   Socioeconomic History  . Marital status: Married    Spouse name: None  . Number of children: None  . Years of education: None  . Highest education level: None  Social Needs  . Financial resource strain: None  . Food insecurity - worry: None  . Food insecurity - inability: None  . Transportation needs - medical: None  . Transportation needs - non-medical: None  Occupational History  . None  Tobacco Use  . Smoking status: Former Smoker    Last attempt to quit: 12/17/1980    Years since quitting: 36.9  . Smokeless tobacco: Never Used  . Tobacco comment: smoked 1966- 1982, up to < 5 cigarettes / day  Substance and Sexual Activity  . Alcohol use: Yes    Alcohol/week: 3.6 oz    Types: 6 Shots of liquor per week    Comment:  socially  . Drug use: No  . Sexual activity: None  Other Topics Concern  . None  Social History Narrative  . None    Family History  Problem Relation Age of Onset  . Heart attack Father 55  . Hypertension Mother   . Stroke Mother        X52  . Diabetes Sister        "pre Diabetes"  . Stroke Maternal Uncle   . Cancer Neg Hx   . Colon cancer Neg Hx   . Rectal cancer Neg Hx   . Stomach cancer Neg Hx     Review of Systems  Constitutional: Negative for chills and fever.  Respiratory: Negative for cough, shortness of breath and wheezing.   Cardiovascular: Negative for chest pain, palpitations and leg swelling.  Neurological: Positive for numbness. Negative for dizziness, light-headedness and headaches.       Objective:    Vitals:   11/21/17 1035  BP: (!) 152/72  Pulse: 62  Resp: 16  SpO2: 98%   Wt Readings from Last 3 Encounters:  11/21/17 208 lb (94.3 kg)  06/10/17 200 lb (90.7 kg)  04/16/17 204 lb (92.5 kg)   Body mass index is 28.21 kg/m.   Physical Exam    Constitutional: Appears well-developed and well-nourished. No distress.  HENT:  Head:  Normocephalic and atraumatic.  Neck: Neck supple. No tracheal deviation present. No thyromegaly present.  No cervical lymphadenopathy Cardiovascular: Normal rate, regular rhythm and normal heart sounds.   No murmur heard. No carotid bruit .  No edema Pulmonary/Chest: Effort normal and breath sounds normal. No respiratory distress. No has no wheezes. No rales.  Skin: Skin is warm and dry. Not diaphoretic.  Psychiatric: Normal mood and affect. Behavior is normal.      Assessment & Plan:    See Problem List for Assessment and Plan of chronic medical problems.

## 2017-11-21 ENCOUNTER — Other Ambulatory Visit (INDEPENDENT_AMBULATORY_CARE_PROVIDER_SITE_OTHER): Payer: PPO

## 2017-11-21 ENCOUNTER — Encounter: Payer: Self-pay | Admitting: Internal Medicine

## 2017-11-21 ENCOUNTER — Ambulatory Visit: Payer: PPO | Admitting: Internal Medicine

## 2017-11-21 VITALS — BP 152/72 | HR 62 | Resp 16 | Wt 208.0 lb

## 2017-11-21 DIAGNOSIS — E1159 Type 2 diabetes mellitus with other circulatory complications: Secondary | ICD-10-CM

## 2017-11-21 DIAGNOSIS — I10 Essential (primary) hypertension: Secondary | ICD-10-CM | POA: Diagnosis not present

## 2017-11-21 DIAGNOSIS — E782 Mixed hyperlipidemia: Secondary | ICD-10-CM

## 2017-11-21 DIAGNOSIS — E1142 Type 2 diabetes mellitus with diabetic polyneuropathy: Secondary | ICD-10-CM

## 2017-11-21 DIAGNOSIS — Z23 Encounter for immunization: Secondary | ICD-10-CM

## 2017-11-21 DIAGNOSIS — G479 Sleep disorder, unspecified: Secondary | ICD-10-CM | POA: Diagnosis not present

## 2017-11-21 DIAGNOSIS — Z8673 Personal history of transient ischemic attack (TIA), and cerebral infarction without residual deficits: Secondary | ICD-10-CM | POA: Diagnosis not present

## 2017-11-21 DIAGNOSIS — E538 Deficiency of other specified B group vitamins: Secondary | ICD-10-CM | POA: Insufficient documentation

## 2017-11-21 LAB — HEMOGLOBIN A1C: HEMOGLOBIN A1C: 6.5 % (ref 4.6–6.5)

## 2017-11-21 LAB — COMPREHENSIVE METABOLIC PANEL
ALBUMIN: 4.4 g/dL (ref 3.5–5.2)
ALT: 10 U/L (ref 0–53)
AST: 12 U/L (ref 0–37)
Alkaline Phosphatase: 65 U/L (ref 39–117)
BUN: 14 mg/dL (ref 6–23)
CHLORIDE: 105 meq/L (ref 96–112)
CO2: 27 mEq/L (ref 19–32)
CREATININE: 1.02 mg/dL (ref 0.40–1.50)
Calcium: 9.3 mg/dL (ref 8.4–10.5)
GFR: 77.19 mL/min (ref >60.00)
GLUCOSE: 139 mg/dL — AB (ref 70–99)
POTASSIUM: 4.4 meq/L (ref 3.5–5.1)
Sodium: 140 mEq/L (ref 135–145)
Total Bilirubin: 0.8 mg/dL (ref 0.2–1.2)
Total Protein: 6.8 g/dL (ref 6.0–8.3)

## 2017-11-21 LAB — LIPID PANEL
CHOL/HDL RATIO: 3
CHOLESTEROL: 135 mg/dL (ref 0–200)
HDL: 44.6 mg/dL (ref >39.00)
LDL CALC: 58 mg/dL (ref 0–99)
NONHDL: 90.6
Triglycerides: 161 mg/dL — ABNORMAL HIGH (ref 0.0–149.0)
VLDL: 32.2 mg/dL (ref 0.0–40.0)

## 2017-11-21 LAB — VITAMIN B12: VITAMIN B 12: 213 pg/mL (ref 211–911)

## 2017-11-21 MED ORDER — ZOLPIDEM TARTRATE 5 MG PO TABS: 5.0000 mg | ORAL_TABLET | Freq: Every evening | ORAL | 3 refills | Status: DC | PRN

## 2017-11-21 NOTE — Patient Instructions (Signed)
  Test(s) ordered today. Your results will be released to MyChart (or called to you) after review, usually within 72hours after test completion. If any changes need to be made, you will be notified at that same time.  All other Health Maintenance issues reviewed.   All recommended immunizations and age-appropriate screenings are up-to-date or discussed.  Flu immunization administered today.   Medications reviewed and updated.  No changes recommended at this time.  Your prescription(s) have been submitted to your pharmacy. Please take as directed and contact our office if you believe you are having problem(s) with the medication(s).   Please followup in 6 months   

## 2017-11-21 NOTE — Assessment & Plan Note (Signed)
Check lipids Will monitor BP at home to make sure it is controlled Continue statin, plavix

## 2017-11-21 NOTE — Assessment & Plan Note (Signed)
Sugars well controlled at home Check a1c Low sugar / carb diet Stressed regular exercise

## 2017-11-21 NOTE — Assessment & Plan Note (Signed)
BP slightly elevated here today Well controlled at home - advised for him to check regularly for now Continue current medications cmp

## 2017-11-21 NOTE — Assessment & Plan Note (Signed)
Check lipid panel  Continue daily statin Regular exercise and healthy diet encouraged  

## 2017-11-21 NOTE — Assessment & Plan Note (Signed)
Takes ambien nightly No side effects Will continue Refilled today

## 2017-11-21 NOTE — Assessment & Plan Note (Signed)
Numbness b/l plantar surfaces Will check B12 Check feet daily

## 2017-11-26 LAB — CUP PACEART REMOTE DEVICE CHECK
Implantable Pulse Generator Implant Date: 20171206
MDC IDC SESS DTM: 20181201223937

## 2017-11-27 ENCOUNTER — Telehealth: Payer: Self-pay | Admitting: Cardiology

## 2017-11-27 NOTE — Telephone Encounter (Signed)
Spoke w/ pt and requested that he send a manual transmission b/c his home monitor has not updated in at least 14 days.   

## 2017-12-04 ENCOUNTER — Other Ambulatory Visit: Payer: Self-pay | Admitting: Internal Medicine

## 2017-12-16 ENCOUNTER — Ambulatory Visit (INDEPENDENT_AMBULATORY_CARE_PROVIDER_SITE_OTHER): Payer: PPO | Admitting: *Deleted

## 2017-12-16 DIAGNOSIS — I639 Cerebral infarction, unspecified: Secondary | ICD-10-CM | POA: Diagnosis not present

## 2017-12-18 NOTE — Progress Notes (Signed)
Carelink Summary Report / Loop Recorder 

## 2017-12-26 LAB — CUP PACEART REMOTE DEVICE CHECK
Date Time Interrogation Session: 20181231223858
Implantable Pulse Generator Implant Date: 20171206

## 2018-01-15 ENCOUNTER — Ambulatory Visit (INDEPENDENT_AMBULATORY_CARE_PROVIDER_SITE_OTHER): Payer: PPO | Admitting: *Deleted

## 2018-01-15 DIAGNOSIS — I639 Cerebral infarction, unspecified: Secondary | ICD-10-CM

## 2018-01-16 NOTE — Progress Notes (Signed)
Carelink Summary Report / Loop Recorder 

## 2018-01-17 ENCOUNTER — Other Ambulatory Visit: Payer: Self-pay | Admitting: Internal Medicine

## 2018-01-27 ENCOUNTER — Other Ambulatory Visit: Payer: Self-pay | Admitting: Internal Medicine

## 2018-01-28 LAB — CUP PACEART REMOTE DEVICE CHECK
Date Time Interrogation Session: 20190130230940
MDC IDC PG IMPLANT DT: 20171206

## 2018-01-29 ENCOUNTER — Telehealth: Payer: Self-pay | Admitting: Cardiology

## 2018-01-29 NOTE — Telephone Encounter (Signed)
LMOVM requesting that pt send manual transmission b/c home monitor has not updated in at least 14 days.    

## 2018-02-17 ENCOUNTER — Ambulatory Visit (INDEPENDENT_AMBULATORY_CARE_PROVIDER_SITE_OTHER): Payer: PPO | Admitting: *Deleted

## 2018-02-17 DIAGNOSIS — I639 Cerebral infarction, unspecified: Secondary | ICD-10-CM | POA: Diagnosis not present

## 2018-02-18 NOTE — Progress Notes (Signed)
Carelink Summary Report / Loop Recorder 

## 2018-03-24 ENCOUNTER — Ambulatory Visit (INDEPENDENT_AMBULATORY_CARE_PROVIDER_SITE_OTHER): Payer: PPO | Admitting: *Deleted

## 2018-03-24 DIAGNOSIS — I639 Cerebral infarction, unspecified: Secondary | ICD-10-CM | POA: Diagnosis not present

## 2018-03-24 LAB — CUP PACEART REMOTE DEVICE CHECK
Date Time Interrogation Session: 20190305004010
MDC IDC PG IMPLANT DT: 20171206

## 2018-03-25 NOTE — Progress Notes (Signed)
Carelink Summary Report / Loop Recorder 

## 2018-04-08 ENCOUNTER — Other Ambulatory Visit: Payer: Self-pay | Admitting: Internal Medicine

## 2018-04-08 NOTE — Telephone Encounter (Signed)
[place] Controlled Substance Database checked. Last filled on 03/18/18 for 20 day supply.   RX phone into pharmacy.

## 2018-04-23 ENCOUNTER — Telehealth: Payer: Self-pay | Admitting: Cardiology

## 2018-04-23 NOTE — Telephone Encounter (Signed)
Attempted to call pt b/c his home monitor has not updated in at least 14 days. No answer and unable to leave a message.  

## 2018-04-24 ENCOUNTER — Ambulatory Visit (INDEPENDENT_AMBULATORY_CARE_PROVIDER_SITE_OTHER): Payer: PPO | Admitting: *Deleted

## 2018-04-24 DIAGNOSIS — I639 Cerebral infarction, unspecified: Secondary | ICD-10-CM

## 2018-04-25 LAB — CUP PACEART REMOTE DEVICE CHECK
Date Time Interrogation Session: 20190407003537
Implantable Pulse Generator Implant Date: 20171206

## 2018-04-28 NOTE — Progress Notes (Signed)
Carelink Summary Report / Loop Recorder 

## 2018-04-30 ENCOUNTER — Encounter: Payer: Self-pay | Admitting: Cardiology

## 2018-05-06 ENCOUNTER — Telehealth: Payer: Self-pay | Admitting: Emergency Medicine

## 2018-05-06 NOTE — Telephone Encounter (Signed)
Called patient to schedule AWV. Patient declined at this time. 

## 2018-05-13 ENCOUNTER — Other Ambulatory Visit: Payer: Self-pay | Admitting: Internal Medicine

## 2018-05-13 MED ORDER — ZOLPIDEM TARTRATE 5 MG PO TABS: ORAL_TABLET | ORAL | 0 refills | Status: DC

## 2018-05-13 NOTE — Telephone Encounter (Signed)
[place] Controlled Substance Database checked. Last filled on 04/08/18

## 2018-05-13 NOTE — Telephone Encounter (Signed)
Copied from [place] [phone]. Topic: Quick Communication - Rx Refill/Question >> May 13, 2018 11:28 AM Yvette Rack wrote: Medication: zolpidem (AMBIEN) 5 MG tablet  Has the patient contacted their pharmacy? Yes.   (Agent: If no, request that the patient contact the pharmacy for the refill.) (Agent: If yes, when and what did the pharmacy advise?)  Preferred Pharmacy (with phone number or street name): [facility] [place], [address] [place] [phone] (Phone) [phone] (Fax)      Agent: Please be advised that RX refills may take up to 3 business days. We ask that you follow-up with your pharmacy.

## 2018-05-13 NOTE — Telephone Encounter (Signed)
Pt would like for this medicine to be sent to the  [facility], [address] [place] [phone] (Phone) [phone] (Fax)   Ambien 5 mg because this was the only pharmacy opened yesterday and he had requested it there

## 2018-05-13 NOTE — Telephone Encounter (Signed)
Pt requesting refill of Ambien.  Last filled on 04/08/18 #30  LOV: 11/21/17 Next OV: 05/22/18 Dr. Quay Burow  [facility] [place]

## 2018-05-13 NOTE — Telephone Encounter (Signed)
Patient requesting call back, aware refill may take up to 3 days

## 2018-05-20 LAB — CUP PACEART REMOTE DEVICE CHECK
Implantable Pulse Generator Implant Date: 20171206
MDC IDC SESS DTM: 20190510003918

## 2018-05-21 NOTE — Patient Instructions (Addendum)
  Test(s) ordered today. Your results will be released to [place] (or called to you) after review, usually within 72hours after test completion. If any changes need to be made, you will be notified at that same time.  All other Health Maintenance issues reviewed.   All recommended immunizations and age-appropriate screenings are up-to-date or discussed.  Pneumovax immunization administered today.   Medications reviewed and updated.  No changes recommended at this time.  Your prescription(s) have been submitted to your pharmacy. Please take as directed and contact our office if you believe you are having problem(s) with the medication(s).   Please followup in 6 months

## 2018-05-21 NOTE — Progress Notes (Signed)
Subjective:    Patient ID: Alex Bryant, male    DOB: 17-Sep-1951, 67 y.o.   MRN: [id number]  HPI The patient is here for follow up.  Diabetes: He is taking his medication daily as prescribed. He is compliant with a diabetic diet. He is exercising regularly. He monitors his sugars and they have been controlled. He checks his feet.He is up-to-date with an ophthalmology examination - schedule for next week.   Hypertension: He is taking his medication daily. He is compliant with a low sodium diet.  He denies chest pain, palpitations, edema, shortness of breath and regular headaches. He is exercising regularly.     Hyperlipidemia: He is taking his medication daily. He is compliant with a low fat/cholesterol diet. He is exercising regularly. He denies myalgias.   H/o CVA:  He is taking his plavix and statin daily.  He is walking daily.    Sleep disorder:  He is taking [place] almost every night.  He is unable to sleep w/o it.    Medications and allergies reviewed with patient and updated if appropriate.  Patient Active Problem List   Diagnosis Date Noted  . Vitamin B 12 deficiency 11/21/2017  . Diabetic neuropathy ([place]) 01/22/2017  . Cerebral thrombosis with cerebral infarction 11/20/2016  . History of CVA (cerebrovascular accident) without residual deficits 11/20/2016  . 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 Plantar fasciitis of right foot 02/17/2014  . Lumbosacral spondylosis without myelopathy 05/25/2013  . Essential hypertension 01/24/2011  . MEDIAL MENISCUS TEAR, RIGHT 07/06/2010  . Diabetes mellitus ([place]) 01/06/2009  . URTICARIA 01/06/2009  . Tubular adenoma of colon 08/26/2008  . Mixed hyperlipidemia 02/24/2008  . History of gout 11/14/2007  . Sleep disorder 09/08/2007  . 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 clopidogrel (PLAVIX) 75 MG tablet TAKE 1 TABLET(75 MG) BY MOUTH DAILY 90 tablet 0  . glucose blood test strip 1 each by Other route as needed. Use as instructed     . Lancets (FREESTYLE) lancets 1 each by Other route 2 (two) times daily. Use to help check blood sugar twice a day. Dx E11.9 100 each 3  . metFORMIN (GLUCOPHAGE) 1000 MG tablet TAKE 1 TABLET(1000 MG) BY MOUTH TWICE DAILY WITH A MEAL 180 tablet 1  . Omega-3 Fatty Acids (FISH OIL) 1000 MG CAPS Take 1 capsule by mouth daily.     . ramipril (ALTACE) 5 MG capsule TAKE ONE CAPSULE BY MOUTH DAILY 90 capsule 1  . simvastatin (ZOCOR) 20 MG tablet TAKE 1 TABLET BY MOUTH DAILY 90 tablet 1  . zolpidem (AMBIEN) 5 MG tablet TAKE 1 TABLET(5 MG) BY MOUTH AT BEDTIME AS NEEDED 30 tablet 0   No current facility-administered medications on file prior to visit.     Past Medical History:  Diagnosis Date  . Diabetes mellitus    AODM  . Gout   . Hyperlipidemia   . Hypertension   . Stroke ([place])   . 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 COLONOSCOPY    . COLONOSCOPY W/ POLYPECTOMY  2010   Dr Olevia Perches  . EP IMPLANTABLE DEVICE N/A 11/21/2016   Procedure: Loop Recorder Insertion;  Surgeon: Thompson Grayer, MD;  Location: [facility];  Service: Cardiovascular;  Laterality: N/A;  . ESI  2014   @L5 -S1 X3   . FEMORAL HERNIA REPAIR     age 18  . fx right face     plating & pinning of maxilla  . lumbosacral fusion  10/17/2013   L5-S1 ; [place]  . POLYPECTOMY  2015   Dr Olevia Perches  . TEE WITHOUT CARDIOVERSION N/A 11/21/2016   Procedure: TRANSESOPHAGEAL ECHOCARDIOGRAM (TEE);  Surgeon: Larey Dresser, MD;  Location: [facility];  Service: Cardiovascular;  Laterality: N/A;    Social History   Socioeconomic History  . Marital status: Married    Spouse name: Not on file  . Number of children: Not on file  . Years of education: Not on file  . Highest education level: Not  on file  Occupational History  . Not on file  Social Needs  . Financial resource strain: Not on file  . Food insecurity:    Worry: Not on file    Inability: Not on file  . Transportation needs:    Medical: Not on file    Non-medical: Not on file  Tobacco Use  . Smoking status: Former Smoker    Last attempt to quit: 12/17/1980    Years since quitting: 37.4  . Smokeless tobacco: Never Used  . Tobacco comment: smoked 1966- 1982, up to < 5 cigarettes / day  Substance and Sexual Activity  . Alcohol use: Yes    Alcohol/week: 3.6 oz    Types: 6 Shots of liquor per week    Comment:  socially  . Drug use: No  . Sexual activity: Not on file  Lifestyle  . Physical activity:    Days per week: Not on file    Minutes per session: Not on file  . Stress: Not on file  Relationships  . Social connections:    Talks on phone: Not on file    Gets together: Not on file    Attends religious service: Not on file    Active member of club or organization: Not on file    Attends meetings of clubs or organizations: Not on file    Relationship status: Not on file  Other Topics Concern  . Not on file  Social History Narrative  . Not on file    Family History  Problem Relation Age of Onset  . Heart attack Father 14  . Hypertension Mother   . Stroke Mother        X61  . Diabetes Sister        "pre Diabetes"  . Stroke Maternal Uncle   . Cancer Neg Hx   . Colon cancer Neg Hx   . Rectal cancer Neg Hx   . Stomach cancer Neg Hx     Review of Systems  Constitutional: Negative for chills and fever.  Respiratory: Negative for cough, shortness of breath and wheezing.   Cardiovascular: Negative for chest pain, palpitations and leg swelling.  Gastrointestinal: Negative for abdominal pain.  Neurological: Negative for headaches.       Objective:   Vitals:   05/22/18 0747  BP: 120/66  Pulse: 63  Resp: 16  Temp: 97.9 F (36.6 C)  SpO2: 98%   BP Readings from Last 3 Encounters:  05/22/18  120/66  11/21/17 (!) 152/72  06/10/17 134/76   Wt Readings from Last 3 Encounters:  05/22/18 205 lb (93 kg)  11/21/17 208 lb (94.3 kg)  06/10/17 200 lb (90.7 kg)   Body mass index is 27.8  kg/m.   Physical Exam    Constitutional: Appears well-developed and well-nourished. No distress.  HENT:  Head: Normocephalic and atraumatic.  Neck: Neck supple. No tracheal deviation present. No thyromegaly present.  No cervical lymphadenopathy Cardiovascular: Normal rate, regular rhythm and normal heart sounds.   No murmur heard. No carotid bruit .  No edema Pulmonary/Chest: Effort normal and breath sounds normal. No respiratory distress. No has no wheezes. No rales.  Skin: Skin is warm and dry. Not diaphoretic.  Psychiatric: Normal mood and affect. Behavior is normal.      Assessment & Plan:    See Problem List for Assessment and Plan of chronic medical problems.

## 2018-05-22 ENCOUNTER — Other Ambulatory Visit (INDEPENDENT_AMBULATORY_CARE_PROVIDER_SITE_OTHER): Payer: PPO

## 2018-05-22 ENCOUNTER — Ambulatory Visit (INDEPENDENT_AMBULATORY_CARE_PROVIDER_SITE_OTHER): Payer: PPO | Admitting: Internal Medicine

## 2018-05-22 ENCOUNTER — Encounter: Payer: Self-pay | Admitting: Internal Medicine

## 2018-05-22 VITALS — BP 120/66 | HR 63 | Temp 97.9°F | Resp 16 | Ht 72.0 in | Wt 205.0 lb

## 2018-05-22 DIAGNOSIS — Z23 Encounter for immunization: Secondary | ICD-10-CM

## 2018-05-22 DIAGNOSIS — Z8673 Personal history of transient ischemic attack (TIA), and cerebral infarction without residual deficits: Secondary | ICD-10-CM

## 2018-05-22 DIAGNOSIS — G479 Sleep disorder, unspecified: Secondary | ICD-10-CM

## 2018-05-22 DIAGNOSIS — I10 Essential (primary) hypertension: Secondary | ICD-10-CM | POA: Diagnosis not present

## 2018-05-22 DIAGNOSIS — E782 Mixed hyperlipidemia: Secondary | ICD-10-CM

## 2018-05-22 DIAGNOSIS — E1159 Type 2 diabetes mellitus with other circulatory complications: Secondary | ICD-10-CM | POA: Diagnosis not present

## 2018-05-22 LAB — COMPREHENSIVE METABOLIC PANEL
ALBUMIN: 4.2 g/dL (ref 3.5–5.2)
ALK PHOS: 67 U/L (ref 39–117)
ALT: 13 U/L (ref 0–53)
AST: 14 U/L (ref 0–37)
BUN: 18 mg/dL (ref 6–23)
CALCIUM: 9.8 mg/dL (ref 8.4–10.5)
CHLORIDE: 105 meq/L (ref 96–112)
CO2: 27 mEq/L (ref 19–32)
Creatinine, Ser: 1.12 mg/dL (ref 0.40–1.50)
GFR: 69.19 mL/min (ref >60.00)
Glucose, Bld: 159 mg/dL — ABNORMAL HIGH (ref 70–99)
POTASSIUM: 4.6 meq/L (ref 3.5–5.1)
SODIUM: 139 meq/L (ref 135–145)
TOTAL PROTEIN: 6.9 g/dL (ref 6.0–8.3)
Total Bilirubin: 0.7 mg/dL (ref 0.2–1.2)

## 2018-05-22 LAB — HEMOGLOBIN A1C: Hgb A1c MFr Bld: 6.6 % — ABNORMAL HIGH (ref 4.6–6.5)

## 2018-05-22 LAB — LIPID PANEL
CHOL/HDL RATIO: 4
CHOLESTEROL: 141 mg/dL (ref 0–200)
HDL: 39 mg/dL — AB (ref >39.00)
LDL CALC: 70 mg/dL (ref 0–99)
NonHDL: 101.77
TRIGLYCERIDES: 161 mg/dL — AB (ref 0.0–149.0)
VLDL: 32.2 mg/dL (ref 0.0–40.0)

## 2018-05-22 MED ORDER — CLOPIDOGREL BISULFATE 75 MG PO TABS: ORAL_TABLET | ORAL | 3 refills | Status: DC

## 2018-05-22 MED ORDER — ZOLPIDEM TARTRATE 5 MG PO TABS: ORAL_TABLET | ORAL | 5 refills | Status: DC

## 2018-05-22 MED ORDER — RAMIPRIL 5 MG PO CAPS: 5.0000 mg | ORAL_CAPSULE | Freq: Every day | ORAL | 3 refills | Status: DC

## 2018-05-22 NOTE — Assessment & Plan Note (Signed)
Check lipid panel  Continue daily statin Regular exercise and healthy diet encouraged  

## 2018-05-22 NOTE — Assessment & Plan Note (Signed)
BP well controlled Continue statin, plavix Check lipid, cmp

## 2018-05-22 NOTE — Assessment & Plan Note (Signed)
Controlled at home Check a1c Continue current medications

## 2018-05-22 NOTE — Assessment & Plan Note (Signed)
Takes ambien most nights Will continue

## 2018-05-22 NOTE — Addendum Note (Signed)
Addended by: Terence Lux B on: 05/22/2018 08:32 AM   Modules accepted: Orders

## 2018-05-22 NOTE — Assessment & Plan Note (Signed)
BP well controlled Current regimen effective and well tolerated Continue current medications at current doses cmp  

## 2018-05-23 ENCOUNTER — Encounter: Payer: Self-pay | Admitting: Internal Medicine

## 2018-05-27 ENCOUNTER — Ambulatory Visit (INDEPENDENT_AMBULATORY_CARE_PROVIDER_SITE_OTHER): Payer: PPO | Admitting: *Deleted

## 2018-05-27 DIAGNOSIS — I639 Cerebral infarction, unspecified: Secondary | ICD-10-CM | POA: Diagnosis not present

## 2018-05-28 DIAGNOSIS — J069 Acute upper respiratory infection, unspecified: Secondary | ICD-10-CM | POA: Diagnosis not present

## 2018-05-28 NOTE — Progress Notes (Signed)
Carelink Summary Report / Loop Recorder 

## 2018-06-10 ENCOUNTER — Telehealth: Payer: Self-pay

## 2018-06-10 NOTE — Telephone Encounter (Signed)
Spoke with pt and reminded pt of remote transmission that is due today. Pt verbalized understanding.   

## 2018-06-30 ENCOUNTER — Ambulatory Visit (INDEPENDENT_AMBULATORY_CARE_PROVIDER_SITE_OTHER): Payer: PPO | Admitting: *Deleted

## 2018-06-30 DIAGNOSIS — I639 Cerebral infarction, unspecified: Secondary | ICD-10-CM

## 2018-07-01 NOTE — Progress Notes (Signed)
Carelink Summary Report / Loop Recorder 

## 2018-07-03 LAB — CUP PACEART REMOTE DEVICE CHECK
Implantable Pulse Generator Implant Date: 20171206
MDC IDC SESS DTM: 20190612014023

## 2018-07-25 DIAGNOSIS — H5203 Hypermetropia, bilateral: Secondary | ICD-10-CM | POA: Diagnosis not present

## 2018-07-25 DIAGNOSIS — H524 Presbyopia: Secondary | ICD-10-CM | POA: Diagnosis not present

## 2018-07-25 LAB — HM DIABETES EYE EXAM

## 2018-07-28 ENCOUNTER — Other Ambulatory Visit: Payer: Self-pay | Admitting: Internal Medicine

## 2018-07-29 ENCOUNTER — Encounter: Payer: Self-pay | Admitting: Internal Medicine

## 2018-08-01 ENCOUNTER — Ambulatory Visit (INDEPENDENT_AMBULATORY_CARE_PROVIDER_SITE_OTHER): Payer: PPO | Admitting: *Deleted

## 2018-08-01 DIAGNOSIS — I639 Cerebral infarction, unspecified: Secondary | ICD-10-CM

## 2018-08-03 DIAGNOSIS — H6592 Unspecified nonsuppurative otitis media, left ear: Secondary | ICD-10-CM | POA: Diagnosis not present

## 2018-08-03 DIAGNOSIS — J00 Acute nasopharyngitis [common cold]: Secondary | ICD-10-CM | POA: Diagnosis not present

## 2018-08-04 NOTE — Progress Notes (Signed)
Carelink Summary Report / Loop Recorder 

## 2018-08-12 ENCOUNTER — Encounter: Payer: Self-pay | Admitting: Cardiovascular Disease

## 2018-08-12 ENCOUNTER — Ambulatory Visit: Payer: PPO | Admitting: Cardiovascular Disease

## 2018-08-12 ENCOUNTER — Other Ambulatory Visit: Payer: Self-pay | Admitting: *Deleted

## 2018-08-12 VITALS — BP 146/74 | HR 70 | Ht 72.0 in | Wt 208.0 lb

## 2018-08-12 DIAGNOSIS — R072 Precordial pain: Secondary | ICD-10-CM

## 2018-08-12 DIAGNOSIS — I200 Unstable angina: Secondary | ICD-10-CM

## 2018-08-12 DIAGNOSIS — E782 Mixed hyperlipidemia: Secondary | ICD-10-CM

## 2018-08-12 DIAGNOSIS — I10 Essential (primary) hypertension: Secondary | ICD-10-CM | POA: Diagnosis not present

## 2018-08-12 HISTORY — DX: Unstable angina: I20.0

## 2018-08-12 NOTE — H&P (View-Only) (Signed)
08/12/2018 Alex Bryant   May 27, 1951  [id number]  Primary Physician Binnie Rail, MD Primary Cardiologist: Lorretta Harp MD Alex Bryant, [place]  HPI:  Alex Bryant is a 67 y.o. fit appearing married Caucasian male father of 58, grandfather for grandchildren who works as a Museum/gallery curator here in town.  He is self-referred for new onset exertional chest pain and dyspnea.  His primary care provider is Dr. Billey Gosling.  His risk factors include treated diabetes, hypertension hyperlipidemia.  He does have a family history of heart disease with a father who had a myocardial infarction and bypass surgery at age 91.  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  He had a loop recorder implanted by Dr. Rayann Heman December 2017 without evidence of A. fib.  He remains on clopidogrel.  Approximately 2 weeks ago he noticed some shortness of breath with activity.  He played golf last week in [place] and noticed when walking up an incline increasing dyspnea and substernal chest pain.  This happened as recently as last night when taking out the trash.   Current Meds  Medication Sig  . clopidogrel (PLAVIX) 75 MG tablet TAKE 1 TABLET(75 MG) BY MOUTH DAILY  . glucose blood test strip 1 each by Other route as needed. Use as instructed   . Lancets (FREESTYLE) lancets 1 each by Other route 2 (two) times daily. Use to help check blood sugar twice a day. Dx E11.9  . metFORMIN (GLUCOPHAGE) 1000 MG tablet TAKE 1 TABLET(1000 MG) BY MOUTH TWICE DAILY WITH A MEAL  . Omega-3 Fatty Acids (FISH OIL) 1000 MG CAPS Take 1 capsule by mouth daily.   . ramipril (ALTACE) 5 MG capsule Take 1 capsule (5 mg total) by mouth daily.  . simvastatin (ZOCOR) 20 MG tablet TAKE 1 TABLET BY MOUTH DAILY  . zolpidem (AMBIEN) 5 MG tablet TAKE 1 TABLET(5 MG) BY MOUTH AT BEDTIME AS NEEDED     Allergies  Allergen Reactions  . Oxycodone Anxiety    [name], confused, and constipation    Social  History   Socioeconomic History  . Marital status: Married    Spouse name: Not on file  . Number of children: Not on file  . Years of education: Not on file  . Highest education level: Not on file  Occupational History  . Not on file  Social Needs  . Financial resource strain: Not on file  . Food insecurity:    Worry: Not on file    Inability: Not on file  . Transportation needs:    Medical: Not on file    Non-medical: Not on file  Tobacco Use  . Smoking status: Former Smoker    Last attempt to quit: 12/17/1980    Years since quitting: 37.6  . Smokeless tobacco: Never Used  . Tobacco comment: smoked 1966- 1982, up to < 5 cigarettes / day  Substance and Sexual Activity  . Alcohol use: Yes    Alcohol/week: 6.0 standard drinks    Types: 6 Shots of liquor per week    Comment:  socially  . Drug use: No  . Sexual activity: Not on file  Lifestyle  . Physical activity:    Days per week: Not on file    Minutes per session: Not on file  . Stress: Not on file  Relationships  . Social connections:    Talks on phone: Not on  file    Gets together: Not on file    Attends religious service: Not on file    Active member of club or organization: Not on file    Attends meetings of clubs or organizations: Not on file    Relationship status: Not on file  . Intimate partner violence:    Fear of current or ex partner: Not on file    Emotionally abused: Not on file    Physically abused: Not on file    Forced sexual activity: Not on file  Other Topics Concern  . Not on file  Social History Narrative  . Not on file     Review of Systems: General: negative for chills, fever, night sweats or weight changes.  Cardiovascular: negative for chest pain, dyspnea on exertion, edema, orthopnea, palpitations, paroxysmal nocturnal dyspnea or shortness of breath Dermatological: negative for rash Respiratory: negative for cough or wheezing Urologic: negative for hematuria Abdominal: negative for  nausea, vomiting, diarrhea, bright red blood per rectum, melena, or hematemesis Neurologic: negative for visual changes, syncope, or dizziness All other systems reviewed and are otherwise negative except as noted above.    Blood pressure (!) 146/74, pulse 70, height 6' (1.829 m), weight 208 lb (94.3 kg).  General appearance: alert and no distress Neck: no adenopathy, no carotid bruit, no JVD, supple, symmetrical, trachea midline and thyroid not enlarged, symmetric, no tenderness/mass/nodules Lungs: clear to auscultation bilaterally Heart: regular rate and rhythm, S1, S2 normal, no murmur, click, rub or gallop Extremities: extremities normal, atraumatic, no cyanosis or edema Pulses: 2+ and symmetric Skin: Skin color, texture, turgor normal. No rashes or lesions Neurologic: Alert and oriented X 3, normal strength and tone. Normal symmetric reflexes. Normal coordination and gait  EKG sinus rhythm at 70 without ST or T wave changes.  Personally reviewed this EKG.  ASSESSMENT AND PLAN:   Mixed hyperlipidemia History of hyperlipidemia on statin therapy with lipid profile performed 05/22/2018 revealing total cholesterol 141, LDL 70 HDL 39.  Essential hypertension History of essential hypertension her blood pressure measured today at 146/74.  He is on ramipril.  Continue current meds at current dosing.  Chest pain Alex Bryant has a history of treated hypertension, diabetes and hyperlipidemia as well as family history of heart disease with father who had a myocardial infarctions and bypass surgery at age 12.  He has had 1 to 2 weeks of new onset exertional chest pain and dyspnea.  Based on his symptoms, and risk factor profile we decided to proceed with outpatient diagnostic coronary angiography to define his anatomy. The patient understands that risks included but are not limited to stroke (1 in 1000), death (1 in 20), kidney failure [usually temporary] (1 in 500), bleeding (1 in 200), allergic  reaction [possibly serious] (1 in 200). The patient understands and agrees to proceed      Lorretta Harp MD [facility], [facility] 08/12/2018 2:20 PM

## 2018-08-12 NOTE — Patient Instructions (Signed)
    [place] [address] Dept: [phone] Loc: [phone]  Alex Bryant  08/12/2018  You are scheduled for a Cardiac Catheterization on Thursday, August 29 with Dr. Quay Burow.  1. Please arrive at the [facility] (Main Entrance A) at [facility]: [address] [place], [place] [address] at 7:30 AM (This time is two hours before your procedure to ensure your preparation). Free valet parking service is available.   Special note: Every effort is made to have your procedure done on time. Please understand that emergencies sometimes delay scheduled procedures.  2. Diet: Do not eat solid foods after midnight.  The patient may have clear liquids until 5am upon the day of the procedure.  3. Labs: You will need to have blood drawn on TODAY   4. Medication instructions in preparation for your procedure:   DO NOT TAKE METFORMIN Thursday-Friday OR Saturday= RESTART SUNDAY  On the morning of your procedure, take your Aspirin and any morning medicines NOT listed above.  You may use sips of water.  5. Plan for one night stay--bring personal belongings. 6. Bring a current list of your medications and current insurance cards. 7. You MUST have a responsible person to drive you home. 8. Someone MUST be with you the first 24 hours after you arrive home or your discharge will be delayed. 9. Please wear clothes that are easy to get on and off and wear slip-on shoes.  Thank you for allowing [place] to care for you!   -- [facility]

## 2018-08-12 NOTE — Assessment & Plan Note (Signed)
History of hyperlipidemia on statin therapy with lipid profile performed 05/22/2018 revealing total cholesterol 141, LDL 70 HDL 39.

## 2018-08-12 NOTE — Progress Notes (Signed)
08/12/2018 Alex Bryant   [birth year]  [id number]  Primary Physician Alex Rail, MD Primary Cardiologist: Alex Harp MD Alex Bryant, [place]  HPI:  Alex Bryant is a 68 y.o. fit appearing married Caucasian male father of 39, grandfather for grandchildren who works as a Museum/gallery curator here in town.  He is self-referred for new onset exertional chest pain and dyspnea.  His primary care provider is Dr. Billey Bryant.  His risk factors include treated diabetes, hypertension hyperlipidemia.  He does have a family history of heart disease with a father who had a myocardial infarction and bypass surgery at age 62.  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  He had a loop recorder implanted by Dr. Rayann Bryant December 2017 without evidence of A. fib.  He remains on clopidogrel.  Approximately 2 weeks ago he noticed some shortness of breath with activity.  He played golf last week in [place] and noticed when walking up an incline increasing dyspnea and substernal chest pain.  This happened as recently as last night when taking out the trash.   Current Meds  Medication Sig  . clopidogrel (PLAVIX) 75 MG tablet TAKE 1 TABLET(75 MG) BY MOUTH DAILY  . glucose blood test strip 1 each by Other route as needed. Use as instructed   . Lancets (FREESTYLE) lancets 1 each by Other route 2 (two) times daily. Use to help check blood sugar twice a day. Dx E11.9  . metFORMIN (GLUCOPHAGE) 1000 MG tablet TAKE 1 TABLET(1000 MG) BY MOUTH TWICE DAILY WITH A MEAL  . Omega-3 Fatty Acids (FISH OIL) 1000 MG CAPS Take 1 capsule by mouth daily.   . ramipril (ALTACE) 5 MG capsule Take 1 capsule (5 mg total) by mouth daily.  . simvastatin (ZOCOR) 20 MG tablet TAKE 1 TABLET BY MOUTH DAILY  . zolpidem (AMBIEN) 5 MG tablet TAKE 1 TABLET(5 MG) BY MOUTH AT BEDTIME AS NEEDED     Allergies  Allergen Reactions  . Oxycodone Anxiety    [name], confused, and constipation    Social  History   Socioeconomic History  . Marital status: Married    Spouse name: Not on file  . Number of children: Not on file  . Years of education: Not on file  . Highest education level: Not on file  Occupational History  . Not on file  Social Needs  . Financial resource strain: Not on file  . Food insecurity:    Worry: Not on file    Inability: Not on file  . Transportation needs:    Medical: Not on file    Non-medical: Not on file  Tobacco Use  . Smoking status: Former Smoker    Last attempt to quit: 12/17/1980    Years since quitting: 37.6  . Smokeless tobacco: Never Used  . Tobacco comment: smoked 1966- 1982, up to < 5 cigarettes / day  Substance and Sexual Activity  . Alcohol use: Yes    Alcohol/week: 6.0 standard drinks    Types: 6 Shots of liquor per week    Comment:  socially  . Drug use: No  . Sexual activity: Not on file  Lifestyle  . Physical activity:    Days per week: Not on file    Minutes per session: Not on file  . Stress: Not on file  Relationships  . Social connections:    Talks on phone: Not on  file    Gets together: Not on file    Attends religious service: Not on file    Active member of club or organization: Not on file    Attends meetings of clubs or organizations: Not on file    Relationship status: Not on file  . Intimate partner violence:    Fear of current or ex partner: Not on file    Emotionally abused: Not on file    Physically abused: Not on file    Forced sexual activity: Not on file  Other Topics Concern  . Not on file  Social History Narrative  . Not on file     Review of Systems: General: negative for chills, fever, night sweats or weight changes.  Cardiovascular: negative for chest pain, dyspnea on exertion, edema, orthopnea, palpitations, paroxysmal nocturnal dyspnea or shortness of breath Dermatological: negative for rash Respiratory: negative for cough or wheezing Urologic: negative for hematuria Abdominal: negative for  nausea, vomiting, diarrhea, bright red blood per rectum, melena, or hematemesis Neurologic: negative for visual changes, syncope, or dizziness All other systems reviewed and are otherwise negative except as noted above.    Blood pressure (!) 146/74, pulse 70, height 6' (1.829 m), weight 208 lb (94.3 kg).  General appearance: alert and no distress Neck: no adenopathy, no carotid bruit, no JVD, supple, symmetrical, trachea midline and thyroid not enlarged, symmetric, no tenderness/mass/nodules Lungs: clear to auscultation bilaterally Heart: regular rate and rhythm, S1, S2 normal, no murmur, click, rub or gallop Extremities: extremities normal, atraumatic, no cyanosis or edema Pulses: 2+ and symmetric Skin: Skin color, texture, turgor normal. No rashes or lesions Neurologic: Alert and oriented X 3, normal strength and tone. Normal symmetric reflexes. Normal coordination and gait  EKG sinus rhythm at 70 without ST or T wave changes.  Personally reviewed this EKG.  ASSESSMENT AND PLAN:   Mixed hyperlipidemia History of hyperlipidemia on statin therapy with lipid profile performed 05/22/2018 revealing total cholesterol 141, LDL 70 HDL 39.  Essential hypertension History of essential hypertension her blood pressure measured today at 146/74.  He is on ramipril.  Continue current meds at current dosing.  Chest pain [name] has a history of treated hypertension, diabetes and hyperlipidemia as well as family history of heart disease with father who had a myocardial infarctions and bypass surgery at age 48.  He has had 1 to 2 weeks of new onset exertional chest pain and dyspnea.  Based on his symptoms, and risk factor profile we decided to proceed with outpatient diagnostic coronary angiography to define his anatomy. The patient understands that risks included but are not limited to stroke (1 in 1000), death (1 in 48), kidney failure [usually temporary] (1 in 500), bleeding (1 in 200), allergic  reaction [possibly serious] (1 in 200). The patient understands and agrees to proceed      Alex Harp MD [facility], [facility] 08/12/2018 2:20 PM

## 2018-08-12 NOTE — Assessment & Plan Note (Signed)
Alex Bryant has a history of treated hypertension, diabetes and hyperlipidemia as well as family history of heart disease with father who had a myocardial infarctions and bypass surgery at age 69.  He has had 1 to 2 weeks of new onset exertional chest pain and dyspnea.  Based on his symptoms, and risk factor profile we decided to proceed with outpatient diagnostic coronary angiography to define his anatomy. The patient understands that risks included but are not limited to stroke (1 in 1000), death (1 in 66), kidney failure [usually temporary] (1 in 500), bleeding (1 in 200), allergic reaction [possibly serious] (1 in 200). The patient understands and agrees to proceed

## 2018-08-12 NOTE — Assessment & Plan Note (Signed)
History of essential hypertension her blood pressure measured today at 146/74.  He is on ramipril.  Continue current meds at current dosing.

## 2018-08-13 LAB — CBC
HEMOGLOBIN: 13.9 g/dL (ref 13.0–17.7)
Hematocrit: 41 % (ref 37.5–51.0)
MCH: 30.4 pg (ref 26.6–33.0)
MCHC: 33.9 g/dL (ref 31.5–35.7)
MCV: 90 fL (ref 79–97)
Platelets: 228 10*3/uL (ref 150–450)
RBC: 4.57 x10E6/uL (ref 4.14–5.80)
RDW: 13.3 % (ref 12.3–15.4)
WBC: 5.6 10*3/uL (ref 3.4–10.8)

## 2018-08-13 LAB — BASIC METABOLIC PANEL
BUN / CREAT RATIO: 12 (ref 10–24)
BUN: 15 mg/dL (ref 8–27)
CHLORIDE: 99 mmol/L (ref 96–106)
CO2: 25 mmol/L (ref 20–29)
Calcium: 9.7 mg/dL (ref 8.6–10.2)
Creatinine, Ser: 1.23 mg/dL (ref 0.76–1.27)
GFR calc Af Amer: 69 mL/min/{1.73_m2} (ref >59)
GFR calc non Af Amer: 60 mL/min/{1.73_m2} (ref >59)
GLUCOSE: 186 mg/dL — AB (ref 65–99)
Potassium: 4.5 mmol/L (ref 3.5–5.2)
SODIUM: 138 mmol/L (ref 134–144)

## 2018-08-14 ENCOUNTER — Encounter (HOSPITAL_COMMUNITY): Payer: PPO

## 2018-08-14 ENCOUNTER — Other Ambulatory Visit (HOSPITAL_COMMUNITY): Payer: PPO

## 2018-08-14 ENCOUNTER — Inpatient Hospital Stay (HOSPITAL_COMMUNITY): Payer: PPO

## 2018-08-14 ENCOUNTER — Other Ambulatory Visit: Payer: Self-pay

## 2018-08-14 ENCOUNTER — Encounter (HOSPITAL_COMMUNITY)
Admission: AD | Disposition: A | Discharge status: Home / Self Care | Payer: Self-pay | Source: Home / Self Care | Attending: Cardiovascular Disease

## 2018-08-14 ENCOUNTER — Other Ambulatory Visit: Payer: Self-pay | Admitting: *Deleted

## 2018-08-14 ENCOUNTER — Encounter (HOSPITAL_COMMUNITY): Payer: Self-pay | Admitting: Cardiovascular Disease

## 2018-08-14 ENCOUNTER — Inpatient Hospital Stay (HOSPITAL_COMMUNITY)
Admission: AD | Admit: 2018-08-14 | Discharge: 2018-08-23 | DRG: 234 | Disposition: A | Discharge status: Home / Self Care | Payer: PPO | Attending: Cardiothoracic Surgery | Admitting: Cardiothoracic Surgery

## 2018-08-14 DIAGNOSIS — I25110 Atherosclerotic heart disease of native coronary artery with unstable angina pectoris: Principal | ICD-10-CM | POA: Diagnosis present

## 2018-08-14 DIAGNOSIS — I4891 Unspecified atrial fibrillation: Secondary | ICD-10-CM | POA: Diagnosis not present

## 2018-08-14 DIAGNOSIS — I200 Unstable angina: Secondary | ICD-10-CM | POA: Diagnosis not present

## 2018-08-14 DIAGNOSIS — I2510 Atherosclerotic heart disease of native coronary artery without angina pectoris: Secondary | ICD-10-CM

## 2018-08-14 DIAGNOSIS — Z833 Family history of diabetes mellitus: Secondary | ICD-10-CM | POA: Diagnosis not present

## 2018-08-14 DIAGNOSIS — Z01810 Encounter for preprocedural cardiovascular examination: Secondary | ICD-10-CM | POA: Diagnosis not present

## 2018-08-14 DIAGNOSIS — Z01811 Encounter for preprocedural respiratory examination: Secondary | ICD-10-CM

## 2018-08-14 DIAGNOSIS — Z7984 Long term (current) use of oral hypoglycemic drugs: Secondary | ICD-10-CM

## 2018-08-14 DIAGNOSIS — Z01818 Encounter for other preprocedural examination: Secondary | ICD-10-CM | POA: Diagnosis not present

## 2018-08-14 DIAGNOSIS — E782 Mixed hyperlipidemia: Secondary | ICD-10-CM | POA: Diagnosis present

## 2018-08-14 DIAGNOSIS — E119 Type 2 diabetes mellitus without complications: Secondary | ICD-10-CM

## 2018-08-14 DIAGNOSIS — E1140 Type 2 diabetes mellitus with diabetic neuropathy, unspecified: Secondary | ICD-10-CM | POA: Diagnosis present

## 2018-08-14 DIAGNOSIS — I5030 Unspecified diastolic (congestive) heart failure: Secondary | ICD-10-CM

## 2018-08-14 DIAGNOSIS — R072 Precordial pain: Secondary | ICD-10-CM

## 2018-08-14 DIAGNOSIS — I491 Atrial premature depolarization: Secondary | ICD-10-CM | POA: Diagnosis not present

## 2018-08-14 DIAGNOSIS — Z4682 Encounter for fitting and adjustment of non-vascular catheter: Secondary | ICD-10-CM | POA: Diagnosis not present

## 2018-08-14 DIAGNOSIS — D62 Acute posthemorrhagic anemia: Secondary | ICD-10-CM | POA: Diagnosis not present

## 2018-08-14 DIAGNOSIS — I081 Rheumatic disorders of both mitral and tricuspid valves: Secondary | ICD-10-CM | POA: Diagnosis not present

## 2018-08-14 DIAGNOSIS — Z8249 Family history of ischemic heart disease and other diseases of the circulatory system: Secondary | ICD-10-CM | POA: Diagnosis not present

## 2018-08-14 DIAGNOSIS — E538 Deficiency of other specified B group vitamins: Secondary | ICD-10-CM | POA: Diagnosis present

## 2018-08-14 DIAGNOSIS — Z951 Presence of aortocoronary bypass graft: Secondary | ICD-10-CM | POA: Diagnosis not present

## 2018-08-14 DIAGNOSIS — I459 Conduction disorder, unspecified: Secondary | ICD-10-CM | POA: Diagnosis not present

## 2018-08-14 DIAGNOSIS — G479 Sleep disorder, unspecified: Secondary | ICD-10-CM | POA: Diagnosis not present

## 2018-08-14 DIAGNOSIS — Z79899 Other long term (current) drug therapy: Secondary | ICD-10-CM | POA: Diagnosis not present

## 2018-08-14 DIAGNOSIS — Z885 Allergy status to narcotic agent status: Secondary | ICD-10-CM

## 2018-08-14 DIAGNOSIS — Z7902 Long term (current) use of antithrombotics/antiplatelets: Secondary | ICD-10-CM | POA: Diagnosis not present

## 2018-08-14 DIAGNOSIS — Z8673 Personal history of transient ischemic attack (TIA), and cerebral infarction without residual deficits: Secondary | ICD-10-CM | POA: Diagnosis not present

## 2018-08-14 DIAGNOSIS — E8770 Fluid overload, unspecified: Secondary | ICD-10-CM | POA: Diagnosis not present

## 2018-08-14 DIAGNOSIS — Z823 Family history of stroke: Secondary | ICD-10-CM

## 2018-08-14 DIAGNOSIS — Z87891 Personal history of nicotine dependence: Secondary | ICD-10-CM | POA: Diagnosis not present

## 2018-08-14 DIAGNOSIS — I10 Essential (primary) hypertension: Secondary | ICD-10-CM | POA: Diagnosis not present

## 2018-08-14 DIAGNOSIS — E1169 Type 2 diabetes mellitus with other specified complication: Secondary | ICD-10-CM | POA: Diagnosis present

## 2018-08-14 DIAGNOSIS — J9811 Atelectasis: Secondary | ICD-10-CM | POA: Diagnosis not present

## 2018-08-14 DIAGNOSIS — E1159 Type 2 diabetes mellitus with other circulatory complications: Secondary | ICD-10-CM | POA: Diagnosis present

## 2018-08-14 DIAGNOSIS — M109 Gout, unspecified: Secondary | ICD-10-CM | POA: Diagnosis present

## 2018-08-14 DIAGNOSIS — Z09 Encounter for follow-up examination after completed treatment for conditions other than malignant neoplasm: Secondary | ICD-10-CM

## 2018-08-14 DIAGNOSIS — I25118 Atherosclerotic heart disease of native coronary artery with other forms of angina pectoris: Secondary | ICD-10-CM | POA: Diagnosis not present

## 2018-08-14 HISTORY — DX: Atherosclerotic heart disease of native coronary artery without angina pectoris: I25.10

## 2018-08-14 HISTORY — DX: Unstable angina: I20.0

## 2018-08-14 HISTORY — PX: LEFT HEART CATH AND CORONARY ANGIOGRAPHY: CATH118249

## 2018-08-14 HISTORY — DX: Type 2 diabetes mellitus without complications: E11.9

## 2018-08-14 HISTORY — DX: Personal history of other diseases of the musculoskeletal system and connective tissue: Z87.39

## 2018-08-14 LAB — PLATELET INHIBITION P2Y12: Platelet Function  P2Y12: 165 [PRU] — ABNORMAL LOW (ref 194–418)

## 2018-08-14 LAB — GLUCOSE, CAPILLARY
GLUCOSE-CAPILLARY: 132 mg/dL — AB (ref 70–99)
GLUCOSE-CAPILLARY: 125 mg/dL — AB (ref 70–99)
Glucose-Capillary: 136 mg/dL — ABNORMAL HIGH (ref 70–99)

## 2018-08-14 LAB — ECHOCARDIOGRAM COMPLETE
Height: 72 in
Weight: 3296 oz

## 2018-08-14 SURGERY — LEFT HEART CATH AND CORONARY ANGIOGRAPHY
Anesthesia: LOCAL

## 2018-08-14 MED ORDER — VERAPAMIL HCL 2.5 MG/ML IV SOLN
INTRAVENOUS | Status: DC | PRN
  Administered 2018-08-14: 10 mL via INTRA_ARTERIAL

## 2018-08-14 MED ORDER — ASPIRIN 81 MG PO CHEW
81.0000 mg | CHEWABLE_TABLET | Freq: Every day | ORAL | Status: DC
  Administered 2018-08-15 – 2018-08-18 (×4): 81 mg via ORAL
  Filled 2018-08-14 (×4): qty 1

## 2018-08-14 MED ORDER — SODIUM CHLORIDE 0.9 % WEIGHT BASED INFUSION: 1.0000 mL/kg/h | INTRAVENOUS | Status: DC

## 2018-08-14 MED ORDER — ZOLPIDEM TARTRATE 5 MG PO TABS
5.0000 mg | ORAL_TABLET | Freq: Every evening | ORAL | Status: DC | PRN
  Administered 2018-08-14 – 2018-08-18 (×5): 5 mg via ORAL
  Filled 2018-08-14 (×5): qty 1

## 2018-08-14 MED ORDER — FENTANYL CITRATE (PF) 100 MCG/2ML IJ SOLN
INTRAMUSCULAR | Status: AC
  Filled 2018-08-14: qty 2

## 2018-08-14 MED ORDER — HEPARIN SODIUM (PORCINE) 1000 UNIT/ML IJ SOLN
INTRAMUSCULAR | Status: DC | PRN
  Administered 2018-08-14: 4500 [IU] via INTRAVENOUS

## 2018-08-14 MED ORDER — INSULIN ASPART 100 UNIT/ML ~~LOC~~ SOLN
0.0000 [IU] | Freq: Three times a day (TID) | SUBCUTANEOUS | Status: DC
  Administered 2018-08-15: 2 [IU] via SUBCUTANEOUS
  Administered 2018-08-16: 3 [IU] via SUBCUTANEOUS
  Administered 2018-08-16: 2 [IU] via SUBCUTANEOUS
  Administered 2018-08-16: 3 [IU] via SUBCUTANEOUS
  Administered 2018-08-17 (×2): 2 [IU] via SUBCUTANEOUS
  Administered 2018-08-17: 3 [IU] via SUBCUTANEOUS
  Administered 2018-08-18: 2 [IU] via SUBCUTANEOUS
  Administered 2018-08-18 – 2018-08-19 (×3): 3 [IU] via SUBCUTANEOUS

## 2018-08-14 MED ORDER — VERAPAMIL HCL 2.5 MG/ML IV SOLN
INTRAVENOUS | Status: AC
  Filled 2018-08-14: qty 2

## 2018-08-14 MED ORDER — HEPARIN SODIUM (PORCINE) 1000 UNIT/ML IJ SOLN
INTRAMUSCULAR | Status: AC
  Filled 2018-08-14: qty 1

## 2018-08-14 MED ORDER — MIDAZOLAM HCL 2 MG/2ML IJ SOLN
INTRAMUSCULAR | Status: DC | PRN
  Administered 2018-08-14: 1 mg via INTRAVENOUS

## 2018-08-14 MED ORDER — SIMVASTATIN 20 MG PO TABS
20.0000 mg | ORAL_TABLET | Freq: Every day | ORAL | Status: DC
  Administered 2018-08-14: 20 mg via ORAL
  Filled 2018-08-14: qty 1

## 2018-08-14 MED ORDER — LIDOCAINE HCL (PF) 1 % IJ SOLN
INTRAMUSCULAR | Status: AC
  Filled 2018-08-14: qty 30

## 2018-08-14 MED ORDER — IOHEXOL 350 MG/ML SOLN
INTRAVENOUS | Status: DC | PRN
  Administered 2018-08-14: 80 mL via INTRA_ARTERIAL

## 2018-08-14 MED ORDER — SODIUM CHLORIDE 0.9% FLUSH
3.0000 mL | INTRAVENOUS | Status: DC | PRN
  Administered 2018-08-18: 3 mL via INTRAVENOUS
  Filled 2018-08-14: qty 3

## 2018-08-14 MED ORDER — SODIUM CHLORIDE 0.9 % WEIGHT BASED INFUSION
3.0000 mL/kg/h | INTRAVENOUS | Status: DC
  Administered 2018-08-14: 3 mL/kg/h via INTRAVENOUS

## 2018-08-14 MED ORDER — FENTANYL CITRATE (PF) 100 MCG/2ML IJ SOLN
INTRAMUSCULAR | Status: DC | PRN
  Administered 2018-08-14: 25 ug via INTRAVENOUS

## 2018-08-14 MED ORDER — SODIUM CHLORIDE 0.9 % IV SOLN: 250.0000 mL | INTRAVENOUS | Status: DC | PRN

## 2018-08-14 MED ORDER — SODIUM CHLORIDE 0.9 % IV SOLN: INTRAVENOUS | Status: AC

## 2018-08-14 MED ORDER — LIDOCAINE HCL (PF) 1 % IJ SOLN
INTRAMUSCULAR | Status: DC | PRN
  Administered 2018-08-14: 3 mL

## 2018-08-14 MED ORDER — RAMIPRIL 5 MG PO CAPS
5.0000 mg | ORAL_CAPSULE | Freq: Every day | ORAL | Status: DC
  Administered 2018-08-14 – 2018-08-16 (×3): 5 mg via ORAL
  Filled 2018-08-14 (×2): qty 1

## 2018-08-14 MED ORDER — HEPARIN (PORCINE) IN NACL 1000-0.9 UT/500ML-% IV SOLN
INTRAVENOUS | Status: AC
  Filled 2018-08-14: qty 1500

## 2018-08-14 MED ORDER — HEPARIN (PORCINE) IN NACL 1000-0.9 UT/500ML-% IV SOLN
INTRAVENOUS | Status: DC | PRN
  Administered 2018-08-14 (×3): 500 mL

## 2018-08-14 MED ORDER — ASPIRIN 81 MG PO CHEW
81.0000 mg | CHEWABLE_TABLET | ORAL | Status: AC
  Administered 2018-08-14: 81 mg via ORAL
  Filled 2018-08-14: qty 1

## 2018-08-14 MED ORDER — SODIUM CHLORIDE 0.9% FLUSH: 3.0000 mL | Freq: Two times a day (BID) | INTRAVENOUS | Status: DC

## 2018-08-14 MED ORDER — SODIUM CHLORIDE 0.9% FLUSH: 3.0000 mL | INTRAVENOUS | Status: DC | PRN

## 2018-08-14 MED ORDER — ONDANSETRON HCL 4 MG/2ML IJ SOLN: 4.0000 mg | Freq: Four times a day (QID) | INTRAMUSCULAR | Status: DC | PRN

## 2018-08-14 MED ORDER — MIDAZOLAM HCL 2 MG/2ML IJ SOLN
INTRAMUSCULAR | Status: AC
  Filled 2018-08-14: qty 2

## 2018-08-14 MED ORDER — ACETAMINOPHEN 325 MG PO TABS
650.0000 mg | ORAL_TABLET | ORAL | Status: DC | PRN
  Administered 2018-08-14 – 2018-08-16 (×3): 650 mg via ORAL
  Filled 2018-08-14 (×3): qty 2

## 2018-08-14 MED ORDER — SODIUM CHLORIDE 0.9% FLUSH
3.0000 mL | Freq: Two times a day (BID) | INTRAVENOUS | Status: DC
  Administered 2018-08-15 – 2018-08-18 (×8): 3 mL via INTRAVENOUS

## 2018-08-14 SURGICAL SUPPLY — 14 items
CATH INFINITI 5FR ANG PIGTAIL (CATHETERS) ×2 IMPLANT
CATH INFINITI JR4 5F (CATHETERS) ×2 IMPLANT
CATH OPTITORQUE TIG 4.0 5F (CATHETERS) ×2 IMPLANT
CATH OPTITORQUE TIG 4.0 6F (CATHETERS) IMPLANT
DEVICE RAD COMP TR BAND LRG (VASCULAR PRODUCTS) ×2 IMPLANT
GLIDESHEATH SLEND A-KIT 6F 22G (SHEATH) ×2 IMPLANT
GUIDEWIRE INQWIRE 1.5J.035X260 (WIRE) ×1 IMPLANT
INQWIRE 1.5J .035X260CM (WIRE) ×2
KIT HEART LEFT (KITS) ×2 IMPLANT
PACK CARDIAC CATHETERIZATION (CUSTOM PROCEDURE TRAY) ×2 IMPLANT
SYR MEDRAD MARK V 150ML (SYRINGE) ×2 IMPLANT
TRANSDUCER W/STOPCOCK (MISCELLANEOUS) ×2 IMPLANT
TUBING CIL FLEX 10 FLL-RA (TUBING) ×2 IMPLANT
WIRE HI TORQ VERSACORE-J 145CM (WIRE) ×2 IMPLANT

## 2018-08-14 NOTE — Progress Notes (Signed)
MD notified about pt's glass of wine before bedtime. MD aware of pt's evening glass of wine with family.  Fransico Michael, RN

## 2018-08-14 NOTE — Interval H&P Note (Signed)
Cath Lab Visit (complete for each Cath Lab visit)  Clinical Evaluation Leading to the Procedure:   ACS: No.  Non-ACS:    Anginal Classification: CCS III  Anti-ischemic medical therapy: No Therapy  Non-Invasive Test Results: No non-invasive testing performed  Prior CABG: No previous CABG      History and Physical Interval Note:  08/14/2018 9:09 AM  [name]  has presented today for surgery, with the diagnosis of cp  The various methods of treatment have been discussed with the patient and family. After consideration of risks, benefits and other options for treatment, the patient has consented to  Procedure(s): LEFT HEART CATH AND CORONARY ANGIOGRAPHY (N/A) as a surgical intervention .  The patient's history has been reviewed, patient examined, no change in status, stable for surgery.  I have reviewed the patient's chart and labs.  Questions were answered to the patient's satisfaction.     Quay Burow

## 2018-08-14 NOTE — Progress Notes (Signed)
  Echocardiogram 2D Echocardiogram has been performed.  Alex Bryant 08/14/2018, 2:16 PM

## 2018-08-14 NOTE — Consult Note (Addendum)
Alex Bryant       [place] [address]             [phone]        Alex Bryant Alex Bryant Date of Birth: July 21, 1951  Referring: Dr. Gwenlyn Found, MD Primary Care: Binnie Rail, MD   Chief Complaint:   Exertional chest pain  Reason for consultation: Coronary artery disease  History of Present Illness:     This is a 67 year old male with a past medical history of stroke (on Plavix 75 mg daily), diabetes mellitus, hypertension, hyperlipidemia and family history (fatehr had MI and CABG at age 30) who presented to Dr. Kennon Holter office with complaints of 1-2 weeks of new onset exertional chest pain and shortness of breath. According to the patient, he was walking at [employer] [facility] [place] in [place] the other week when he had chest pain, shortness of breath, and diaphoresis. He also has felt more fatigued and a sensation of fullness of late. He denied nausea, emesis, syncope. EKG sinus rhythm at 70 without ST or T wave changes. He was admitted today to undergo a cardiac catheterization. Results showed LVEF 55-65% and multivessel significant coronary artery disease (for specifics please see below). Echo has been done but results are currently not available. Currently, he denies chest pain or shortness of breath.   Current Activity/ Functional Status: Patient is independent with mobility/ambulation, transfers, ADL's, IADL's.   Zubrod Score: At the time of surgery this patient's most appropriate activity status/level should be described as: []     0    Normal activity, no symptoms [x]     1    Restricted in physical strenuous activity but ambulatory, able to do out light work []     2    Ambulatory and capable of self care, unable to do work activities, up and about                 more than 50%  Of the time                            []     3    Only limited self care, in bed greater than 50% of waking hours []     4    Completely disabled, no  self care, confined to bed or chair []     5    Moribund  Past Medical History:  Diagnosis Date  . Diabetes mellitus    AODM  . Gout   . Hyperlipidemia   . Hypertension   . Stroke ([place])   . 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 COLONOSCOPY    . COLONOSCOPY W/ POLYPECTOMY  2010   Dr Olevia Perches  . EP IMPLANTABLE DEVICE N/A 11/21/2016   Procedure: Loop Recorder Insertion;  Surgeon: Thompson Grayer, MD;  Location: [facility];  Service: Cardiovascular;  Laterality: N/A;  . ESI  2014   @L5 -S1 X3   . FEMORAL HERNIA REPAIR     age 41  . fx right face     plating & pinning of maxilla  . LEFT HEART CATH AND CORONARY ANGIOGRAPHY N/A 08/14/2018   Procedure: LEFT HEART CATH AND CORONARY ANGIOGRAPHY;  Surgeon: Lorretta Harp, MD;  Location: [facility];  Service: Cardiovascular;  Laterality: N/A;  . lumbosacral  fusion  10/17/2013   L5-S1 ; [place]  . POLYPECTOMY  2015   Dr Olevia Perches  . TEE WITHOUT CARDIOVERSION N/A 11/21/2016   Procedure: TRANSESOPHAGEAL ECHOCARDIOGRAM (TEE);  Surgeon: Larey Dresser, MD;  Location: [facility]COPY;  Service: Cardiovascular;  Laterality: N/A;    Social History   Tobacco Use  Smoking Status Former Smoker  . Last attempt to quit: 12/17/1980  . Years since quitting: 37.6  Smokeless Tobacco Never Used  Tobacco Comment   smoked 1966- 1982, up to < 5 cigarettes / day    Social History   Substance and Sexual Activity  Alcohol Use Yes  . Alcohol/week: 6.0 standard drinks  . Types: 6 Shots of liquor per week   Comment:  socially     Allergies  Allergen Reactions  . Oxycodone Anxiety    [name], confused, and constipation    Current Facility-Administered Medications  Medication Dose Route Frequency Provider Last Rate Last Dose  . 0.9 %  sodium chloride infusion   Intravenous Continuous Lorretta Harp, MD 75 mL/hr at 08/14/18 1009 75 mL/hr at 08/14/18 1009  . 0.9 %   sodium chloride infusion  250 mL Intravenous PRN Lorretta Harp, MD      . acetaminophen (TYLENOL) tablet 650 mg  650 mg Oral Q4H PRN Lorretta Harp, MD      . Derrill Memo ON 08/15/2018] aspirin chewable tablet 81 mg  81 mg Oral Daily Lorretta Harp, MD      . insulin aspart (novoLOG) injection 0-15 Units  0-15 Units Subcutaneous TID WC Reino Bellis B, NP      . ondansetron [facility]) injection 4 mg  4 mg Intravenous Q6H PRN Lorretta Harp, MD      . ramipril (ALTACE) capsule 5 mg  5 mg Oral Daily Lorretta Harp, MD      . simvastatin (ZOCOR) tablet 20 mg  20 mg Oral q1800 Lorretta Harp, MD      . sodium chloride flush (NS) 0.9 % injection 3 mL  3 mL Intravenous Q12H Lorretta Harp, MD      . sodium chloride flush (NS) 0.9 % injection 3 mL  3 mL Intravenous PRN Lorretta Harp, MD      . zolpidem (AMBIEN) tablet 5 mg  5 mg Oral QHS PRN Lorretta Harp, MD        Medications Prior to Admission  Medication Sig Dispense Refill Last Dose  . metFORMIN (GLUCOPHAGE) 1000 MG tablet TAKE 1 TABLET(1000 MG) BY MOUTH TWICE DAILY WITH A MEAL (Patient taking differently: Take 1,000 mg by mouth 2 (two) times daily with a meal. ) 180 tablet 1 08/13/2018 at 1200  . Omega-3 Fatty Acids (FISH OIL) 1000 MG CAPS Take 1,000 capsules by mouth daily.    08/14/2018 at [place]  . ramipril (ALTACE) 5 MG capsule Take 1 capsule (5 mg total) by mouth daily. 90 capsule 3 08/13/2018 at 0645  . simvastatin (ZOCOR) 20 MG tablet TAKE 1 TABLET BY MOUTH DAILY 90 tablet 2 08/13/2018 at 2200  . zolpidem (AMBIEN) 5 MG tablet TAKE 1 TABLET(5 MG) BY MOUTH AT BEDTIME AS NEEDED 30 tablet 5 08/13/2018 at 2200  . glucose blood test strip 1 each by Other route as needed. Use as instructed    Taking  . Lancets (FREESTYLE) lancets 1 each by Other route 2 (two) times daily. Use to help check blood sugar twice a day. Dx E11.9 100 each 3 Taking  Family History  Problem Relation Age of Onset  . Heart attack Father 36  .  Hypertension Mother   . Stroke Mother        X7  . Diabetes Sister        "pre Diabetes"  . Stroke Maternal Uncle   . Cancer Neg Hx   . Colon cancer Neg Hx   . Rectal cancer Neg Hx   . Stomach cancer Neg Hx   He is the owner of [employer], is married,  and has 3 children. 2 daughters and one son. Oldest daughter lives in [place] but other 2 children live locally.    Review of Systems:    Cardiac Review of Systems: Y or  [ N   ]= no  Chest Pain [ Y   ] Resting SOB [ N  ] Exertional SOB  [ Y ]  Orthopnea [ N ]   Pedal Edema Aqua.Slicker   ]    Palpitations [ N ] Syncope  Aqua.Slicker  ]  Presyncope [ N  ]  General Review of Systems: [Y] = yes [N  ]=no Constitional:  fatigue [ N ]; nausea [ N ]; night sweats Aqua.Slicker  ]; fever [ N ]; or chills [ N ]                                                                Eye : blurred vision Aqua.Slicker  ]; diplopia [  N ];  Amaurosis fugax[N  ]; Resp: cough [ N ];  wheezing[N  ];  hemoptysis[N  ];  GI:  vomiting[ N ];  dysphagia[ N ]; melena[N  ];  hematochezia Aqua.Slicker  ]; GU:  hematuria[N  ];   dysuria Aqua.Slicker  ];              Skin: rash, swelling[ N ];  or itching[ N ]; Musculosketetal: myalgias[  ];  joint swelling[  ];  joint erythema[  ];  joint pain[  ];  back pain[  ];  Heme/Lymph: bruising[ N ];   anemia[ N ];  Neuro: TIA[ N ];    stroke[ Y ];  vertigo[ N ];  seizures[ N ];     Psych:depression[ N ]; anxiety[ N ];  Endocrine: diabetes[ N ];  thyroid dysfunction[N  ];             Last dental visit less than one year ago    Physical Exam: BP (!) 159/83 (BP Location: Left Arm)   Pulse 70   Temp 98.3 F (36.8 C) (Oral)   Resp 15   Ht 6' (1.829 m)   Wt 93.4 kg   SpO2 99%   BMI 27.94 kg/m    General appearance: alert, cooperative and no distress Head: Normocephalic, without obvious abnormality, atraumatic Neck: no carotid bruit, no JVD and supple, symmetrical, trachea midline Resp: clear to auscultation bilaterally Cardio: RRR, no murmur GI: Soft, slightly  protuberant, bowel sounds present, non tender Extremities: No LE edema;palpable DP and PT bilaterally Neurologic: Grossly normal  Diagnostic Studies & Laboratory data:    Lorretta Harp, MD (Primary) on 08/14/2018:    Procedures   LEFT HEART CATH AND CORONARY ANGIOGRAPHY  Conclusion     Prox Cx lesion is 75%  stenosed.  Mid Cx to Dist Cx lesion is 90% stenosed.  Ost 2nd Mrg lesion is 95% stenosed.  Ost 2nd Mrg to 2nd Mrg lesion is 70% stenosed.  Ost LAD to Prox LAD lesion is 95% stenosed.  Prox LAD lesion is 95% stenosed.  Ost Ramus to Ramus lesion is 90% stenosed.  Ramus lesion is 90% stenosed.  Prox RCA to Mid RCA lesion is 90% stenosed.  Dist RCA lesion is 95% stenosed.  The left ventricular systolic function is normal.  LV end diastolic pressure is normal.  The left ventricular ejection fraction is 55-65% by visual estimate.   Resting               Left Heart   Left Ventricle The left ventricular size is normal. The left ventricular systolic function is normal. LV end diastolic pressure is normal. The left ventricular ejection fraction is 55-65% by visual estimate. No regional wall motion abnormalities.  Coronary Diagrams   Diagnostic Diagram           Recent Radiology Findings:   No results found.   I have independently reviewed the above radiologic studies and discussed with the patient   Recent Lab Findings: Lab Results  Component Value Date   WBC 5.6 08/12/2018   HGB 13.9 08/12/2018   HCT 41.0 08/12/2018   PLT 228 08/12/2018   GLUCOSE 186 (H) 08/12/2018   CHOL 141 05/22/2018   TRIG 161.0 (H) 05/22/2018   HDL 39.00 (L) 05/22/2018   LDLDIRECT 153.3 09/08/2007   LDLCALC 70 05/22/2018   ALT 13 05/22/2018   AST 14 05/22/2018   NA 138 08/12/2018   K 4.5 08/12/2018   CL 99 08/12/2018   CREATININE 1.23 08/12/2018   BUN 15 08/12/2018   CO2 25 08/12/2018   TSH 1.24 04/19/2017   INR 1.07 11/19/2016   HGBA1C 6.6 (H) 05/22/2018    Assessment / Plan:   1. Coronary artery disease-would benefit from coronary artery bypass grafting surgery. He was on Plavix and therefore, we will wait until Plavix washout prior to performing heart surgery. He is scheduled for Tuesday September 3rd with Dr. Prescott Gum. 2. History of hypertension-on Ramipril 5 mg daily 3. History of hyperlipidemia-on Simvastatin 20 mg at hs 4. History of DM-on Insulin now but was on Metformin prior to admission. HGA1C 6.6. 5. History of stroke-was on Plavix prior to admission and last dose this am   I  spent 20 minutes counseling the patient face to face.   Alex Pinks PA-C 08/14/2018 2:14 PM  Patient examined, coronary angiogram and echocardiogram images personally reviewed and counseled with patient and wife 51 year old with well-controlled diabetes and hypertension presents with unstable angina and has been found to have severe three-vessel CAD with normal LV function.  No significant valve disease on echo.  Patient has been on Plavix since 2017 when he had a stroke not related to A. fib.  His platelet inhibition is significant with PT Y 12 assay at 165. He would benefit from multivessel bypass grafting to LAD diagonal posterior descending OM1 and possibly the distal circumflex.  Surgery scheduled after Plavix washout on September 3

## 2018-08-15 ENCOUNTER — Other Ambulatory Visit (HOSPITAL_COMMUNITY): Payer: PPO

## 2018-08-15 ENCOUNTER — Inpatient Hospital Stay (HOSPITAL_COMMUNITY): Payer: PPO

## 2018-08-15 ENCOUNTER — Encounter (HOSPITAL_COMMUNITY): Payer: Self-pay | Admitting: Physician Assistant

## 2018-08-15 ENCOUNTER — Encounter (HOSPITAL_COMMUNITY): Payer: PPO

## 2018-08-15 DIAGNOSIS — E1159 Type 2 diabetes mellitus with other circulatory complications: Secondary | ICD-10-CM

## 2018-08-15 DIAGNOSIS — E782 Mixed hyperlipidemia: Secondary | ICD-10-CM

## 2018-08-15 DIAGNOSIS — I200 Unstable angina: Secondary | ICD-10-CM

## 2018-08-15 DIAGNOSIS — I10 Essential (primary) hypertension: Secondary | ICD-10-CM

## 2018-08-15 DIAGNOSIS — I25118 Atherosclerotic heart disease of native coronary artery with other forms of angina pectoris: Secondary | ICD-10-CM

## 2018-08-15 LAB — HEMOGLOBIN A1C
Hgb A1c MFr Bld: 6.5 % — ABNORMAL HIGH (ref 4.8–5.6)
MEAN PLASMA GLUCOSE: 139.85 mg/dL

## 2018-08-15 LAB — SURGICAL PCR SCREEN
MRSA, PCR: NEGATIVE
Staphylococcus aureus: NEGATIVE

## 2018-08-15 LAB — PULMONARY FUNCTION TEST
DL/VA % pred: 78 %
DL/VA: 3.69 ml/min/mmHg/L
DLCO cor % pred: 78 %
DLCO cor: 27.63 ml/min/mmHg
DLCO unc % pred: 75 %
DLCO unc: 26.56 ml/min/mmHg
FEF 25-75 Post: 6.31 L/sec
FEF 25-75 Pre: 4.79 L/sec
FEF2575-%Change-Post: 31 %
FEF2575-%Pred-Post: 231 %
FEF2575-%Pred-Pre: 175 %
FEV1-%Change-Post: 9 %
FEV1-%Pred-Post: 126 %
FEV1-%Pred-Pre: 115 %
FEV1-Post: 4.51 L
FEV1-Pre: 4.12 L
FEV1FVC-%Change-Post: 8 %
FEV1FVC-%Pred-Pre: 113 %
FEV6-%Change-Post: 1 %
FEV6-%Pred-Post: 108 %
FEV6-%Pred-Pre: 107 %
FEV6-Post: 4.96 L
FEV6-Pre: 4.9 L
FEV6FVC-%Change-Post: 0 %
FEV6FVC-%Pred-Post: 105 %
FEV6FVC-%Pred-Pre: 104 %
FVC-%Change-Post: 1 %
FVC-%Pred-Post: 103 %
FVC-%Pred-Pre: 102 %
FVC-Post: 4.97 L
FVC-Pre: 4.92 L
Post FEV1/FVC ratio: 91 %
Post FEV6/FVC ratio: 100 %
Pre FEV1/FVC ratio: 84 %
Pre FEV6/FVC Ratio: 100 %
RV % pred: 121 %
RV: 3.07 L
TLC % pred: 113 %
TLC: 8.4 L

## 2018-08-15 LAB — BASIC METABOLIC PANEL
Anion gap: 9 (ref 5–15)
BUN: 19 mg/dL (ref 8–23)
CO2: 25 mmol/L (ref 22–32)
Calcium: 9.4 mg/dL (ref 8.9–10.3)
Chloride: 106 mmol/L (ref 98–111)
Creatinine, Ser: 1.16 mg/dL (ref 0.61–1.24)
GFR calc Af Amer: >60 mL/min (ref >60)
GFR calc non Af Amer: >60 mL/min (ref >60)
Glucose, Bld: 138 mg/dL — ABNORMAL HIGH (ref 70–99)
Potassium: 4.3 mmol/L (ref 3.5–5.1)
Sodium: 140 mmol/L (ref 135–145)

## 2018-08-15 LAB — PROTIME-INR
INR: 1.1
Prothrombin Time: 14.1 seconds (ref 11.4–15.2)

## 2018-08-15 LAB — CBC
HCT: 38.2 % — ABNORMAL LOW (ref 39.0–52.0)
Hemoglobin: 13.3 g/dL (ref 13.0–17.0)
MCH: 31 pg (ref 26.0–34.0)
MCHC: 34.8 g/dL (ref 30.0–36.0)
MCV: 89 fL (ref 78.0–100.0)
Platelets: 178 10*3/uL (ref 150–400)
RBC: 4.29 MIL/uL (ref 4.22–5.81)
RDW: 12.6 % (ref 11.5–15.5)
WBC: 5.6 10*3/uL (ref 4.0–10.5)

## 2018-08-15 LAB — GLUCOSE, CAPILLARY
Glucose-Capillary: 136 mg/dL — ABNORMAL HIGH (ref 70–99)
Glucose-Capillary: 127 mg/dL — ABNORMAL HIGH (ref 70–99)
Glucose-Capillary: 171 mg/dL — ABNORMAL HIGH (ref 70–99)

## 2018-08-15 LAB — HEPATIC FUNCTION PANEL
ALT: 16 U/L (ref 0–44)
AST: 13 U/L — ABNORMAL LOW (ref 15–41)
Albumin: 3.6 g/dL (ref 3.5–5.0)
Alkaline Phosphatase: 63 U/L (ref 38–126)
Bilirubin, Direct: 0.1 mg/dL (ref 0.0–0.2)
Indirect Bilirubin: 0.4 mg/dL (ref 0.3–0.9)
Total Bilirubin: 0.5 mg/dL (ref 0.3–1.2)
Total Protein: 6.4 g/dL — ABNORMAL LOW (ref 6.5–8.1)

## 2018-08-15 MED ORDER — ALBUTEROL SULFATE (2.5 MG/3ML) 0.083% IN NEBU
2.5000 mg | INHALATION_SOLUTION | Freq: Once | RESPIRATORY_TRACT | Status: AC
  Administered 2018-08-15: 2.5 mg via RESPIRATORY_TRACT

## 2018-08-15 MED ORDER — METOPROLOL SUCCINATE ER 25 MG PO TB24
25.0000 mg | ORAL_TABLET | Freq: Every day | ORAL | Status: DC
  Administered 2018-08-15 – 2018-08-16 (×2): 25 mg via ORAL
  Filled 2018-08-15 (×2): qty 1

## 2018-08-15 MED ORDER — ROSUVASTATIN CALCIUM 40 MG PO TABS
40.0000 mg | ORAL_TABLET | Freq: Every day | ORAL | Status: DC
  Administered 2018-08-15 – 2018-08-18 (×4): 40 mg via ORAL
  Filled 2018-08-15 (×4): qty 4

## 2018-08-15 NOTE — Progress Notes (Signed)
Progress Note  Patient Name: ESAW KNIPPEL Date of Encounter: 08/15/2018  Primary Cardiologist: Quay Burow, MD  Subjective   No chest pain or shortness of breath.  States he did not have chest pain or shortness of breath except for when exerting himself more than usual.  Results Reviewed with him, he reluctantly accepts the necessity hospital until the surgery.  Inpatient Medications    Scheduled Meds: . aspirin  81 mg Oral Daily  . insulin aspart  0-15 Units Subcutaneous TID WC  . ramipril  5 mg Oral Daily  . simvastatin  20 mg Oral q1800  . sodium chloride flush  3 mL Intravenous Q12H   Continuous Infusions: . sodium chloride     PRN Meds: sodium chloride, acetaminophen, ondansetron (ZOFRAN) IV, sodium chloride flush, zolpidem   Vital Signs    Vitals:   08/14/18 1243 08/14/18 2015 08/15/18 0455 08/15/18 1230  BP: (!) 159/83 (!) 156/86 (!) 148/81 133/83  Pulse: 70 64 (!) 58 62  Resp: 15   18  Temp: 98.3 F (36.8 C) 98.6 F (37 C) 97.7 F (36.5 C) 98.6 F (37 C)  TempSrc: Oral Oral Oral Oral  SpO2: 99% 97% 96% 95%  Weight:      Height:        Intake/Output Summary (Last 24 hours) at 08/15/2018 1248 Last data filed at 08/15/2018 0300 Gross per 24 hour  Intake 240 ml  Output 575 ml  Net -335 ml   Filed Weights   08/14/18 0747  Weight: 93.4 kg    Telemetry    Sinus rhythm, no significant- Personally Reviewed  ECG    None today- Personally Reviewed  Physical Exam   General: Well developed, well nourished, male appearing in no acute distress. Head: Normocephalic, atraumatic.  Neck: Supple without bruits, JVD elevated. Lungs:  Resp regular and unlabored, CTA. Heart: RRR, S1, S2, no S3, S4, soft murmur; no rub. Abdomen: Soft, non-tender, non-distended with normoactive bowel sounds. No hepatomegaly. No rebound/guarding. No obvious abdominal masses. Extremities: No clubbing, cyanosis, no edema. Distal pedal pulses are 2+ bilaterally.  Right  radial cath site without ecchymosis or hematoma, good distal pulse Neuro: Alert and oriented X 3. Moves all extremities spontaneously. Psych: Normal affect.  Labs    Hematology Recent Labs  Lab 08/12/18 1437 08/15/18 0259  WBC 5.6 5.6  RBC 4.57 4.29  HGB 13.9 13.3  HCT 41.0 38.2*  MCV 90 89.0  MCH 30.4 31.0  MCHC 33.9 34.8  RDW 13.3 12.6  PLT 228 178    Chemistry Recent Labs  Lab 08/12/18 1437 08/15/18 0259  NA 138 140  K 4.5 4.3  CL 99 106  CO2 25 25  GLUCOSE 186* 138*  BUN 15 19  CREATININE 1.23 1.16  CALCIUM 9.7 9.4  PROT  --  6.4*  ALBUMIN  --  3.6  AST  --  13*  ALT  --  16  ALKPHOS  --  63  BILITOT  --  0.5  GFRNONAA 60 >60  GFRAA 69 >60  ANIONGAP  --  9     Lab Results  Component Value Date   CHOL 141 05/22/2018   HDL 39.00 (L) 05/22/2018   LDLCALC 70 05/22/2018   LDLDIRECT 153.3 09/08/2007   TRIG 161.0 (H) 05/22/2018   CHOLHDL 4 05/22/2018     Radiology    Dg Chest 2 View  Result Date: 08/15/2018 CLINICAL DATA:  Preoperative evaluation EXAM: CHEST - 2 VIEW COMPARISON:  01/01/2011  FINDINGS: Cardiac shadow is within normal limits. Recording device is noted in the subcutaneous tissues of the left anterior chest wall. The lungs are well aerated bilaterally. Degenerative changes of the thoracic spine are noted. IMPRESSION: No acute abnormality noted. Electronically Signed   By: Inez Catalina M.D.   On: 08/15/2018 09:01     Cardiac Studies   ECHO:  08/14/2018 - Left ventricle: The cavity size was normal. There was moderate   concentric hypertrophy. Systolic function was normal. The   estimated ejection fraction was in the range of 60% to 65%. Wall   motion was normal; there were no regional wall motion   abnormalities. Doppler parameters are consistent with abnormal   left ventricular relaxation (grade 1 diastolic dysfunction).   There was no evidence of elevated ventricular filling pressure by   Doppler parameters. - Aortic valve: There  was no regurgitation. - Mitral valve: Calcified annulus. - Right ventricle: The cavity size was normal. Wall thickness was   normal. Systolic function was normal. - Right atrium: The atrium was normal in size. - Tricuspid valve: There was no regurgitation. - Inferior vena cava: The vessel was normal in size. - Pericardium, extracardiac: There was no pericardial effusion.   CATH: 08/14/2018  Prox Cx lesion is 75% stenosed.  Mid Cx to Dist Cx lesion is 90% stenosed.  Ost 2nd Mrg lesion is 95% stenosed.  Ost 2nd Mrg to 2nd Mrg lesion is 70% stenosed.  Ost LAD to Prox LAD lesion is 95% stenosed.  Prox LAD lesion is 95% stenosed.  Ost Ramus to Ramus lesion is 90% stenosed.  Ramus lesion is 90% stenosed.  Prox RCA to Mid RCA lesion is 90% stenosed.  Dist RCA lesion is 95% stenosed.  The left ventricular systolic function is normal.  LV end diastolic pressure is normal.  The left ventricular ejection fraction is 55-65% by visual estimate. IMPRESSION: Mr. Knoll has severe three-vessel disease with preserved LV function.  He will require coronary artery bypass grafting.  He has been on Plavix which may be problematic with regards to timing.  I am going to get a verify now P2 Y 12 test this morning to assess platelet reactivity.  I have spoken to the cardiothoracic surgeons who will see him today and make a final disposition with regards to timing.  Given the severity of his disease, I favor keeping him in the hospital awaiting his bypass graft surgery.  The sheath was removed and a TR band was placed on the right wrist to achieve patent hemostasis.  The patient left the lab in stable condition. Diagnostic Diagram        Patient Profile     67 y.o. male w/ hx CVA on Plavix, DM, HTN, HLD, FH CAD, was seen by Dr Gwenlyn Found 08/27 for exertional CP/SOB and scheduled for cath on 08/29.    Assessment & Plan    Principal Problem: 1.  Progressive angina ([place]) -Status post cath with  three-vessel disease  - Patient has been seen by Dr. Prescott Gum, surgery planned next week, after Plavix washout  Active Problems: Cardiac risk factors  2.  Diabetes mellitus ([place])   - Continue insulin, check A1c  3.  Mixed hyperlipidemia - Previously on simvastatin but LDL was 153. -Goal LDL is now 70 -Change to high-dose statin, Crestor 40.  4.  Essential hypertension -Continue ramipril, add low-dose beta-blocker  5.  CAD (coronary artery disease) See above  SignedRosaria Ferries , PA-C 12:48 PM 08/15/2018 Pager:  [phone]  

## 2018-08-15 NOTE — Progress Notes (Signed)
CARDIAC REHAB PHASE I   PRE:  Rate/Rhythm: 64 SR  BP:  Supine: 149/78  Sitting:   Standing:    SaO2: 95%RA  MODE:  Ambulation: 470 ft   POST:  Rate/Rhythm: 70 SR  BP:  Supine: 175/83  Sitting:   Standing:    SaO2: 98%RA 1025-1055 Pt walked 470 ft on RA with steady gait. Tolerated well with no CP. Gave pt IS and he can reach 2500 ml correctly. Discussed importance of IS and walking after surgery. Gave [facility] booklet, care guide and in the tube handout. Wife stated she will be available 24/7 after discharge. Wrote down how to view pre op video.   Graylon Good, RN BSN  08/15/2018 10:58 AM

## 2018-08-16 LAB — GLUCOSE, CAPILLARY
GLUCOSE-CAPILLARY: 165 mg/dL — AB (ref 70–99)
Glucose-Capillary: 171 mg/dL — ABNORMAL HIGH (ref 70–99)
Glucose-Capillary: 152 mg/dL — ABNORMAL HIGH (ref 70–99)
Glucose-Capillary: 138 mg/dL — ABNORMAL HIGH (ref 70–99)
Glucose-Capillary: 169 mg/dL — ABNORMAL HIGH (ref 70–99)

## 2018-08-16 MED ORDER — RAMIPRIL 10 MG PO CAPS
10.0000 mg | ORAL_CAPSULE | Freq: Every day | ORAL | Status: DC
  Administered 2018-08-17 – 2018-08-18 (×2): 10 mg via ORAL
  Filled 2018-08-16 (×2): qty 1

## 2018-08-16 MED ORDER — CARVEDILOL 3.125 MG PO TABS
3.1250 mg | ORAL_TABLET | Freq: Two times a day (BID) | ORAL | Status: DC
  Administered 2018-08-16 – 2018-08-18 (×5): 3.125 mg via ORAL
  Filled 2018-08-16 (×5): qty 1

## 2018-08-16 NOTE — Progress Notes (Signed)
CARDIAC REHAB PHASE I   PRE:  Rate/Rhythm: 76 SB  BP:  Supine:   Sitting: 127/76  Standing:    SaO2: 97% RA  MODE:  Ambulation: 500 ft   POST:  Rate/Rhythm: 62 SR  BP:  Supine:   Sitting: 151/81  Standing:    SaO2: 100% RA   [id number] Patient tolerated ambulation well, walking independently without chest pain, no c/o. To chair after walk, call bell within reach.  Sol Passer, MS, ACSM CEP

## 2018-08-16 NOTE — Plan of Care (Signed)
  Problem: Health Behavior/Discharge Planning: Goal: Ability to safely manage health-related needs after discharge will improve Outcome: Progressing   Problem: Education: Goal: Knowledge of General Education information will improve Description Including pain rating scale, medication(s)/side effects and non-pharmacologic comfort measures Outcome: Progressing   Problem: Clinical Measurements: Goal: Ability to maintain clinical measurements within normal limits will improve Outcome: Progressing

## 2018-08-16 NOTE — Progress Notes (Signed)
Progress Note  Patient Name: Alex Bryant Date of Encounter: 08/16/2018  Primary Cardiologist: Quay Burow, MD  Subjective   Feels good. No CP or SOB.   Inpatient Medications    Scheduled Meds: . aspirin  81 mg Oral Daily  . insulin aspart  0-15 Units Subcutaneous TID WC  . metoprolol succinate  25 mg Oral Daily  . ramipril  5 mg Oral Daily  . rosuvastatin  40 mg Oral q1800  . sodium chloride flush  3 mL Intravenous Q12H   Continuous Infusions: . sodium chloride     PRN Meds: sodium chloride, acetaminophen, ondansetron (ZOFRAN) IV, sodium chloride flush, zolpidem   Vital Signs    Vitals:   08/15/18 1230 08/15/18 2040 08/16/18 0625 08/16/18 0828  BP: 133/83 (!) 149/87 (!) 147/86   Pulse: 62 63 62 64  Resp: 18     Temp: 98.6 F (37 C) 98.1 F (36.7 C) 98.1 F (36.7 C)   TempSrc: Oral Oral Oral   SpO2: 95% 99% 96%   Weight:      Height:       No intake or output data in the 24 hours ending 08/16/18 1204 Filed Weights   08/14/18 0747  Weight: 93.4 kg    Telemetry    Sinus rhythm 60s Personally reviewed   ECG    None today- Personally Reviewed  Physical Exam   General:  Well appearing. No resp difficulty HEENT: normal Neck: supple. no JVD. Carotids 2+ bilat; no bruits. No lymphadenopathy or thryomegaly appreciated. Cor: PMI nondisplaced. Regular rate & rhythm. No rubs, gallops or murmurs. Lungs: clear Abdomen: soft, nontender, nondistended. No hepatosplenomegaly. No bruits or masses. Good bowel sounds. Extremities: no cyanosis, clubbing, rash, edema Neuro: alert & orientedx3, cranial nerves grossly intact. moves all 4 extremities w/o difficulty. Affect pleasant   Labs    Hematology Recent Labs  Lab 08/12/18 1437 08/15/18 0259  WBC 5.6 5.6  RBC 4.57 4.29  HGB 13.9 13.3  HCT 41.0 38.2*  MCV 90 89.0  MCH 30.4 31.0  MCHC 33.9 34.8  RDW 13.3 12.6  PLT 228 178    Chemistry Recent Labs  Lab 08/12/18 1437 08/15/18 0259  NA 138  140  K 4.5 4.3  CL 99 106  CO2 25 25  GLUCOSE 186* 138*  BUN 15 19  CREATININE 1.23 1.16  CALCIUM 9.7 9.4  PROT  --  6.4*  ALBUMIN  --  3.6  AST  --  13*  ALT  --  16  ALKPHOS  --  63  BILITOT  --  0.5  GFRNONAA 60 >60  GFRAA 69 >60  ANIONGAP  --  9     Lab Results  Component Value Date   CHOL 141 05/22/2018   HDL 39.00 (L) 05/22/2018   LDLCALC 70 05/22/2018   LDLDIRECT 153.3 09/08/2007   TRIG 161.0 (H) 05/22/2018   CHOLHDL 4 05/22/2018     Radiology    Dg Chest 2 View  Result Date: 08/15/2018 CLINICAL DATA:  Preoperative evaluation EXAM: CHEST - 2 VIEW COMPARISON:  01/01/2011 FINDINGS: Cardiac shadow is within normal limits. Recording device is noted in the subcutaneous tissues of the left anterior chest wall. The lungs are well aerated bilaterally. Degenerative changes of the thoracic spine are noted. IMPRESSION: No acute abnormality noted. Electronically Signed   By: Inez Catalina M.D.   On: 08/15/2018 09:01     Cardiac Studies   ECHO:  08/14/2018 - Left ventricle: The cavity  size was normal. There was moderate   concentric hypertrophy. Systolic function was normal. The   estimated ejection fraction was in the range of 60% to 65%. Wall   motion was normal; there were no regional wall motion   abnormalities. Doppler parameters are consistent with abnormal   left ventricular relaxation (grade 1 diastolic dysfunction).   There was no evidence of elevated ventricular filling pressure by   Doppler parameters. - Aortic valve: There was no regurgitation. - Mitral valve: Calcified annulus. - Right ventricle: The cavity size was normal. Wall thickness was   normal. Systolic function was normal. - Right atrium: The atrium was normal in size. - Tricuspid valve: There was no regurgitation. - Inferior vena cava: The vessel was normal in size. - Pericardium, extracardiac: There was no pericardial effusion.   CATH: 08/14/2018  Prox Cx lesion is 75% stenosed.  Mid  Cx to Dist Cx lesion is 90% stenosed.  Ost 2nd Mrg lesion is 95% stenosed.  Ost 2nd Mrg to 2nd Mrg lesion is 70% stenosed.  Ost LAD to Prox LAD lesion is 95% stenosed.  Prox LAD lesion is 95% stenosed.  Ost Ramus to Ramus lesion is 90% stenosed.  Ramus lesion is 90% stenosed.  Prox RCA to Mid RCA lesion is 90% stenosed.  Dist RCA lesion is 95% stenosed.  The left ventricular systolic function is normal.  LV end diastolic pressure is normal.  The left ventricular ejection fraction is 55-65% by visual estimate. IMPRESSION: Mr. Cimo has severe three-vessel disease with preserved LV function.  He will require coronary artery bypass grafting.  He has been on Plavix which may be problematic with regards to timing.  I am going to get a verify now P2 Y 12 test this morning to assess platelet reactivity.  I have spoken to the cardiothoracic surgeons who will see him today and make a final disposition with regards to timing.  Given the severity of his disease, I favor keeping him in the hospital awaiting his bypass graft surgery.  The sheath was removed and a TR band was placed on the right wrist to achieve patent hemostasis.  The patient left the lab in stable condition. Diagnostic Diagram        Patient Profile     67 y.o. male w/ hx CVA on Plavix, DM, HTN, HLD, FH CAD, was seen by Dr Gwenlyn Found 08/27 for exertional CP/SOB and scheduled for cath on 08/29.    Assessment & Plan    Principal Problem: 1.  Progressive angina (HCC) with 3v CAD - Status post cath with three-vessel disease. EF 55-65% - Patient has been seen by Dr. Prescott Gum, surgery planned next Tuesday after Plavix washout - on ASA, statin and b-blocker  2.  Diabetes mellitus ([place])   - Continue insulin, check A1c  - Consier Jardaince on d/c  3.  Mixed hyperlipidemia - Previously on simvastatin but LDL was 153. -Goal LDL is now 70 -Changed to high-dose statin, Crestor 40.  4.  Essential hypertension - BP remains  elevated on ACE and b-blokcer - Switch toprol to carvedilol. Increase ramipril.    Signed, Glori Bickers , MD 12:04 PM 08/16/2018 Pager: [phone]

## 2018-08-17 ENCOUNTER — Other Ambulatory Visit (HOSPITAL_COMMUNITY): Payer: PPO

## 2018-08-17 LAB — GLUCOSE, CAPILLARY
GLUCOSE-CAPILLARY: 211 mg/dL — AB (ref 70–99)
Glucose-Capillary: 172 mg/dL — ABNORMAL HIGH (ref 70–99)
Glucose-Capillary: 134 mg/dL — ABNORMAL HIGH (ref 70–99)
Glucose-Capillary: 145 mg/dL — ABNORMAL HIGH (ref 70–99)

## 2018-08-17 MED ORDER — TRANEXAMIC ACID 1000 MG/10ML IV SOLN
1.5000 mg/kg/h | INTRAVENOUS | Status: AC
  Administered 2018-08-19: 1.5 mg/kg/h via INTRAVENOUS
  Filled 2018-08-17: qty 25

## 2018-08-17 MED ORDER — DEXMEDETOMIDINE HCL IN NACL 400 MCG/100ML IV SOLN
0.1000 ug/kg/h | INTRAVENOUS | Status: DC
  Filled 2018-08-17: qty 100

## 2018-08-17 MED ORDER — SODIUM CHLORIDE 0.9 % IV SOLN
INTRAVENOUS | Status: DC
  Filled 2018-08-17: qty 30

## 2018-08-17 MED ORDER — BISACODYL 5 MG PO TBEC
5.0000 mg | DELAYED_RELEASE_TABLET | Freq: Once | ORAL | Status: AC
  Administered 2018-08-17: 5 mg via ORAL
  Filled 2018-08-17: qty 1

## 2018-08-17 MED ORDER — TRANEXAMIC ACID (OHS) PUMP PRIME SOLUTION
2.0000 mg/kg | INTRAVENOUS | Status: DC
  Filled 2018-08-17: qty 1.87

## 2018-08-17 MED ORDER — DOPAMINE-DEXTROSE 3.2-5 MG/ML-% IV SOLN
0.0000 ug/kg/min | INTRAVENOUS | Status: DC
  Filled 2018-08-17: qty 250

## 2018-08-17 MED ORDER — ENOXAPARIN SODIUM 40 MG/0.4ML ~~LOC~~ SOLN
40.0000 mg | SUBCUTANEOUS | Status: DC
  Administered 2018-08-17 – 2018-08-18 (×2): 40 mg via SUBCUTANEOUS
  Filled 2018-08-17 (×2): qty 0.4

## 2018-08-17 MED ORDER — SODIUM CHLORIDE 0.9 % IV SOLN
1.5000 g | INTRAVENOUS | Status: AC
  Administered 2018-08-19: 1.5 g via INTRAVENOUS
  Filled 2018-08-17: qty 1.5

## 2018-08-17 MED ORDER — VANCOMYCIN HCL 10 G IV SOLR
1500.0000 mg | INTRAVENOUS | Status: AC
  Administered 2018-08-19: 1500 mg via INTRAVENOUS
  Filled 2018-08-17: qty 1500

## 2018-08-17 MED ORDER — MILRINONE LACTATE IN DEXTROSE 20-5 MG/100ML-% IV SOLN
0.1250 ug/kg/min | INTRAVENOUS | Status: AC
  Administered 2018-08-19: 0.125 ug/kg/min via INTRAVENOUS
  Filled 2018-08-17: qty 100

## 2018-08-17 MED ORDER — EPINEPHRINE PF 1 MG/ML IJ SOLN
0.0000 ug/min | INTRAVENOUS | Status: DC
  Filled 2018-08-17: qty 4

## 2018-08-17 MED ORDER — NITROGLYCERIN IN D5W 200-5 MCG/ML-% IV SOLN
2.0000 ug/min | INTRAVENOUS | Status: AC
  Administered 2018-08-19: 16.6 ug/min via INTRAVENOUS
  Filled 2018-08-17: qty 250

## 2018-08-17 MED ORDER — MAGNESIUM SULFATE 50 % IJ SOLN
40.0000 meq | INTRAMUSCULAR | Status: DC
  Filled 2018-08-17: qty 9.85

## 2018-08-17 MED ORDER — POTASSIUM CHLORIDE 2 MEQ/ML IV SOLN
80.0000 meq | INTRAVENOUS | Status: DC
  Filled 2018-08-17: qty 40

## 2018-08-17 MED ORDER — SODIUM CHLORIDE 0.9 % IV SOLN
30.0000 ug/min | INTRAVENOUS | Status: DC
  Filled 2018-08-17: qty 2

## 2018-08-17 MED ORDER — TRANEXAMIC ACID (OHS) BOLUS VIA INFUSION
15.0000 mg/kg | INTRAVENOUS | Status: AC
  Administered 2018-08-19: 1401 mg via INTRAVENOUS
  Filled 2018-08-17: qty 1401

## 2018-08-17 MED ORDER — SODIUM CHLORIDE 0.9 % IV SOLN
750.0000 mg | INTRAVENOUS | Status: AC
  Administered 2018-08-19: .75 g via INTRAVENOUS
  Filled 2018-08-17: qty 750

## 2018-08-17 MED ORDER — SODIUM CHLORIDE 0.9 % IV SOLN
INTRAVENOUS | Status: DC
  Filled 2018-08-17: qty 1

## 2018-08-17 MED ORDER — PLASMA-LYTE 148 IV SOLN
INTRAVENOUS | Status: DC
  Filled 2018-08-17: qty 2.5

## 2018-08-17 NOTE — Plan of Care (Signed)
  Problem: Health Behavior/Discharge Planning: Goal: Ability to safely manage health-related needs after discharge will improve Outcome: Progressing   Problem: Education: Goal: Knowledge of General Education information will improve Description Including pain rating scale, medication(s)/side effects and non-pharmacologic comfort measures Outcome: Progressing   Problem: Health Behavior/Discharge Planning: Goal: Ability to manage health-related needs will improve Outcome: Progressing   Problem: Clinical Measurements: Goal: Ability to maintain clinical measurements within normal limits will improve Outcome: Progressing

## 2018-08-17 NOTE — Progress Notes (Signed)
Progress Note  Patient Name: Alex Bryant Date of Encounter: 08/17/2018  Primary Cardiologist: Quay Burow, MD  Subjective   Able to walk halls without CP or SOB. Rhythm stable.   Inpatient Medications    Scheduled Meds: . aspirin  81 mg Oral Daily  . carvedilol  3.125 mg Oral BID WC  . insulin aspart  0-15 Units Subcutaneous TID WC  . ramipril  10 mg Oral Daily  . rosuvastatin  40 mg Oral q1800  . sodium chloride flush  3 mL Intravenous Q12H   Continuous Infusions: . sodium chloride     PRN Meds: sodium chloride, acetaminophen, ondansetron (ZOFRAN) IV, sodium chloride flush, zolpidem   Vital Signs    Vitals:   08/16/18 0828 08/16/18 1701 08/16/18 2116 08/17/18 0458  BP:  (!) 166/85 (!) 155/88 (!) 146/79  Pulse: 64 65 68 62  Resp:   17 17  Temp:   98 F (36.7 C) 97.6 F (36.4 C)  TempSrc:   Oral Oral  SpO2:   96% 95%  Weight:      Height:        Intake/Output Summary (Last 24 hours) at 08/17/2018 0833 Last data filed at 08/17/2018 0458 Gross per 24 hour  Intake -  Output 600 ml  Net -600 ml   Filed Weights   08/14/18 0747  Weight: 93.4 kg    Telemetry    Sinus rhythm 60s Personally reviewed   ECG    None today- Personally Reviewed  Physical Exam   General:  Well appearing. No resp difficulty HEENT: normal Neck: supple. no JVD. Carotids 2+ bilat; no bruits. No lymphadenopathy or thryomegaly appreciated. Cor: PMI nondisplaced. Regular rate & rhythm. No rubs, gallops or murmurs. Lungs: clear Abdomen: soft, nontender, nondistended. No hepatosplenomegaly. No bruits or masses. Good bowel sounds. Extremities: no cyanosis, clubbing, rash, edema Neuro: alert & orientedx3, cranial nerves grossly intact. moves all 4 extremities w/o difficulty. Affect pleasant   Labs    Hematology Recent Labs  Lab 08/12/18 1437 08/15/18 0259  WBC 5.6 5.6  RBC 4.57 4.29  HGB 13.9 13.3  HCT 41.0 38.2*  MCV 90 89.0  MCH 30.4 31.0  MCHC 33.9 34.8  RDW 13.3  12.6  PLT 228 178    Chemistry Recent Labs  Lab 08/12/18 1437 08/15/18 0259  NA 138 140  K 4.5 4.3  CL 99 106  CO2 25 25  GLUCOSE 186* 138*  BUN 15 19  CREATININE 1.23 1.16  CALCIUM 9.7 9.4  PROT  --  6.4*  ALBUMIN  --  3.6  AST  --  13*  ALT  --  16  ALKPHOS  --  63  BILITOT  --  0.5  GFRNONAA 60 >60  GFRAA 69 >60  ANIONGAP  --  9     Lab Results  Component Value Date   CHOL 141 05/22/2018   HDL 39.00 (L) 05/22/2018   LDLCALC 70 05/22/2018   LDLDIRECT 153.3 09/08/2007   TRIG 161.0 (H) 05/22/2018   CHOLHDL 4 05/22/2018     Radiology    Dg Chest 2 View  Result Date: 08/15/2018 CLINICAL DATA:  Preoperative evaluation EXAM: CHEST - 2 VIEW COMPARISON:  01/01/2011 FINDINGS: Cardiac shadow is within normal limits. Recording device is noted in the subcutaneous tissues of the left anterior chest wall. The lungs are well aerated bilaterally. Degenerative changes of the thoracic spine are noted. IMPRESSION: No acute abnormality noted. Electronically Signed   By: Inez Catalina  M.D.   On: 08/15/2018 09:01     Cardiac Studies   ECHO:  08/14/2018 - Left ventricle: The cavity size was normal. There was moderate   concentric hypertrophy. Systolic function was normal. The   estimated ejection fraction was in the range of 60% to 65%. Wall   motion was normal; there were no regional wall motion   abnormalities. Doppler parameters are consistent with abnormal   left ventricular relaxation (grade 1 diastolic dysfunction).   There was no evidence of elevated ventricular filling pressure by   Doppler parameters. - Aortic valve: There was no regurgitation. - Mitral valve: Calcified annulus. - Right ventricle: The cavity size was normal. Wall thickness was   normal. Systolic function was normal. - Right atrium: The atrium was normal in size. - Tricuspid valve: There was no regurgitation. - Inferior vena cava: The vessel was normal in size. - Pericardium, extracardiac: There  was no pericardial effusion.   CATH: 08/14/2018  Prox Cx lesion is 75% stenosed.  Mid Cx to Dist Cx lesion is 90% stenosed.  Ost 2nd Mrg lesion is 95% stenosed.  Ost 2nd Mrg to 2nd Mrg lesion is 70% stenosed.  Ost LAD to Prox LAD lesion is 95% stenosed.  Prox LAD lesion is 95% stenosed.  Ost Ramus to Ramus lesion is 90% stenosed.  Ramus lesion is 90% stenosed.  Prox RCA to Mid RCA lesion is 90% stenosed.  Dist RCA lesion is 95% stenosed.  The left ventricular systolic function is normal.  LV end diastolic pressure is normal.  The left ventricular ejection fraction is 55-65% by visual estimate. IMPRESSION: Mr. Fallen has severe three-vessel disease with preserved LV function.  He will require coronary artery bypass grafting.  He has been on Plavix which may be problematic with regards to timing.  I am going to get a verify now P2 Y 12 test this morning to assess platelet reactivity.  I have spoken to the cardiothoracic surgeons who will see him today and make a final disposition with regards to timing.  Given the severity of his disease, I favor keeping him in the hospital awaiting his bypass graft surgery.  The sheath was removed and a TR band was placed on the right wrist to achieve patent hemostasis.  The patient left the lab in stable condition. Diagnostic Diagram        Patient Profile     67 y.o. male w/ hx CVA on Plavix, DM, HTN, HLD, FH CAD, was seen by Dr Gwenlyn Found 08/27 for exertional CP/SOB and scheduled for cath on 08/29.    Assessment & Plan    Principal Problem: 1.  Progressive angina (HCC) with 3v CAD - Status post cath with three-vessel disease. EF 55-65% - Patient has been seen by Dr. Prescott Gum, surgery planned for Tuesday after Plavix washout - on ASA, statin and b-blocker  2.  Diabetes mellitus ([place])   - Continue insulin, check A1c  - Consider Jardaince on d/c (I discussed this with them)  3.  Mixed hyperlipidemia - Previously on simvastatin but LDL  was 153. -Goal LDL is now 70 -Changed to high-dose statin, Crestor 40.  4.  Essential hypertension - BP remains elevated on ACE and b-blokcer - Meds adjusted yesterday. BP slightly improved. Will adjust again after CABG   Signed, Glori Bickers , MD 8:33 AM 08/17/2018 Pager: [phone]

## 2018-08-18 ENCOUNTER — Inpatient Hospital Stay (HOSPITAL_COMMUNITY): Payer: PPO

## 2018-08-18 DIAGNOSIS — I25110 Atherosclerotic heart disease of native coronary artery with unstable angina pectoris: Secondary | ICD-10-CM

## 2018-08-18 DIAGNOSIS — Z01810 Encounter for preprocedural cardiovascular examination: Secondary | ICD-10-CM

## 2018-08-18 LAB — URINALYSIS, ROUTINE W REFLEX MICROSCOPIC
Bilirubin Urine: NEGATIVE
Glucose, UA: NEGATIVE mg/dL
Hgb urine dipstick: NEGATIVE
Ketones, ur: NEGATIVE mg/dL
Leukocytes, UA: NEGATIVE
Nitrite: NEGATIVE
Protein, ur: NEGATIVE mg/dL
Specific Gravity, Urine: 1.021 (ref 1.005–1.030)
pH: 5 (ref 5.0–8.0)

## 2018-08-18 LAB — BLOOD GAS, ARTERIAL
Acid-base deficit: 0.2 mmol/L (ref 0.0–2.0)
Bicarbonate: 23.6 mmol/L (ref 20.0–28.0)
Drawn by: 519031
FIO2: 21
O2 Saturation: 95.9 %
Patient temperature: 97.1
pCO2 arterial: 35.3 mmHg (ref 32.0–48.0)
pH, Arterial: 7.437 (ref 7.350–7.450)
pO2, Arterial: 75.5 mmHg — ABNORMAL LOW (ref 83.0–108.0)

## 2018-08-18 LAB — ABO/RH: ABO/RH(D): A POS

## 2018-08-18 LAB — BASIC METABOLIC PANEL
Anion gap: 7 (ref 5–15)
BUN: 18 mg/dL (ref 8–23)
CO2: 24 mmol/L (ref 22–32)
Calcium: 8.9 mg/dL (ref 8.9–10.3)
Chloride: 106 mmol/L (ref 98–111)
Creatinine, Ser: 1.09 mg/dL (ref 0.61–1.24)
GFR calc Af Amer: >60 mL/min (ref >60)
GFR calc non Af Amer: >60 mL/min (ref >60)
Glucose, Bld: 152 mg/dL — ABNORMAL HIGH (ref 70–99)
Potassium: 4.2 mmol/L (ref 3.5–5.1)
Sodium: 137 mmol/L (ref 135–145)

## 2018-08-18 LAB — CUP PACEART REMOTE DEVICE CHECK
Implantable Pulse Generator Implant Date: 20171206
MDC IDC SESS DTM: 20190715020910

## 2018-08-18 LAB — PREPARE RBC (CROSSMATCH)

## 2018-08-18 LAB — CBC
HCT: 40.4 % (ref 39.0–52.0)
Hemoglobin: 14 g/dL (ref 13.0–17.0)
MCH: 30.6 pg (ref 26.0–34.0)
MCHC: 34.7 g/dL (ref 30.0–36.0)
MCV: 88.4 fL (ref 78.0–100.0)
Platelets: 200 10*3/uL (ref 150–400)
RBC: 4.57 MIL/uL (ref 4.22–5.81)
RDW: 12.7 % (ref 11.5–15.5)
WBC: 7 10*3/uL (ref 4.0–10.5)

## 2018-08-18 LAB — GLUCOSE, CAPILLARY
GLUCOSE-CAPILLARY: 165 mg/dL — AB (ref 70–99)
Glucose-Capillary: 159 mg/dL — ABNORMAL HIGH (ref 70–99)
Glucose-Capillary: 123 mg/dL — ABNORMAL HIGH (ref 70–99)
Glucose-Capillary: 139 mg/dL — ABNORMAL HIGH (ref 70–99)

## 2018-08-18 LAB — PLATELET INHIBITION P2Y12: Platelet Function  P2Y12: 200 [PRU] (ref 194–418)

## 2018-08-18 MED ORDER — CHLORHEXIDINE GLUCONATE 0.12 % MT SOLN
15.0000 mL | Freq: Once | OROMUCOSAL | Status: AC
  Administered 2018-08-19: 15 mL via OROMUCOSAL
  Filled 2018-08-18: qty 15

## 2018-08-18 MED ORDER — DIAZEPAM 5 MG PO TABS
5.0000 mg | ORAL_TABLET | Freq: Once | ORAL | Status: AC
  Administered 2018-08-19: 5 mg via ORAL
  Filled 2018-08-18: qty 1

## 2018-08-18 MED ORDER — METOPROLOL TARTRATE 12.5 MG HALF TABLET
12.5000 mg | ORAL_TABLET | Freq: Once | ORAL | Status: AC
  Administered 2018-08-19: 12.5 mg via ORAL
  Filled 2018-08-18: qty 1

## 2018-08-18 MED ORDER — ALPRAZOLAM 0.25 MG PO TABS: 0.2500 mg | ORAL_TABLET | ORAL | Status: DC | PRN

## 2018-08-18 MED ORDER — TEMAZEPAM 7.5 MG PO CAPS: 15.0000 mg | ORAL_CAPSULE | Freq: Once | ORAL | Status: DC | PRN

## 2018-08-18 MED ORDER — CHLORHEXIDINE GLUCONATE 4 % EX LIQD
60.0000 mL | Freq: Once | CUTANEOUS | Status: AC
  Administered 2018-08-18: 4 via TOPICAL
  Filled 2018-08-18: qty 60

## 2018-08-18 MED ORDER — CHLORHEXIDINE GLUCONATE 4 % EX LIQD
60.0000 mL | Freq: Once | CUTANEOUS | Status: AC
  Administered 2018-08-19: 4 via TOPICAL
  Filled 2018-08-18: qty 60

## 2018-08-18 MED ORDER — BISACODYL 5 MG PO TBEC
5.0000 mg | DELAYED_RELEASE_TABLET | Freq: Once | ORAL | Status: AC
  Administered 2018-08-18: 5 mg via ORAL
  Filled 2018-08-18: qty 1

## 2018-08-18 NOTE — Progress Notes (Signed)
Progress Note  Patient Name: Alex Bryant Date of Encounter: 08/18/2018  Primary Cardiologist: Alex Burow, MD   Subjective   [name] had left heart cath via right radial approach by myself last Thursday.  He remained in the hospital for Plavix washout anticipating coronary artery bypass grafting tomorrow morning by Dr. Darcey Bryant.  He is remained stable and denies chest pain.  Inpatient Medications    Scheduled Meds: . aspirin  81 mg Oral Daily  . carvedilol  3.125 mg Oral BID WC  . enoxaparin (LOVENOX) injection  40 mg Subcutaneous Q24H  . [START ON 08/19/2018] heparin-papaverine-plasmalyte irrigation   Irrigation To OR  . insulin aspart  0-15 Units Subcutaneous TID WC  . [START ON 08/19/2018] magnesium sulfate  40 mEq Other To OR  . [START ON 08/19/2018] potassium chloride  80 mEq Other To OR  . ramipril  10 mg Oral Daily  . rosuvastatin  40 mg Oral q1800  . sodium chloride flush  3 mL Intravenous Q12H  . [START ON 08/19/2018] tranexamic acid  15 mg/kg Intravenous To OR  . [START ON 08/19/2018] tranexamic acid  2 mg/kg Intracatheter To OR   Continuous Infusions: . sodium chloride    . [START ON 08/19/2018] cefUROXime (ZINACEF)  IV    . [START ON 08/19/2018] cefUROXime (ZINACEF)  IV    . [START ON 08/19/2018] dexmedetomidine    . [START ON 08/19/2018] DOPamine    . [START ON 08/19/2018] epinephrine    . [START ON 08/19/2018] heparin 30,000 units/NS 1000 mL solution for CELLSAVER    . [START ON 08/19/2018] insulin (NOVOLIN-R) infusion    . [START ON 08/19/2018] milrinone    . [START ON 08/19/2018] nitroGLYCERIN    . [START ON 08/19/2018] phenylephrine 20mg /239mL NS (0.08mg /ml) infusion    . [START ON 08/19/2018] tranexamic acid (CYKLOKAPRON) infusion ([facility])    . [START ON 08/19/2018] vancomycin     PRN Meds: sodium chloride, acetaminophen, ondansetron (ZOFRAN) IV, sodium chloride flush, zolpidem   Vital Signs    Vitals:   08/17/18 1156 08/17/18 1732 08/17/18 2142 08/18/18 0503  BP: 139/81 (!)  149/81 125/64 133/77  Pulse: 60 65 62 63  Resp:   18 18  Temp: 97.7 F (36.5 C)  98 F (36.7 C) 97.9 F (36.6 C)  TempSrc: Oral  Oral Oral  SpO2: 96%  96% 96%  Weight:      Height:       No intake or output data in the 24 hours ending 08/18/18 0745 Filed Weights   08/14/18 0747  Weight: 93.4 kg    Telemetry    Sinus rhythm- Personally Reviewed  ECG    Not performed today- Personally Reviewed  Physical Exam   GEN: No acute distress.   Neck: No JVD Cardiac: RRR, no murmurs, rubs, or gallops.  Respiratory: Clear to auscultation bilaterally. GI: Soft, nontender, non-distended  MS: No edema; No deformity. Neuro:  Nonfocal  Psych: Normal affect   Labs    Chemistry Recent Labs  Lab 08/12/18 1437 08/15/18 0259 08/18/18 0355  NA 138 140 137  K 4.5 4.3 4.2  CL 99 106 106  CO2 25 25 24   GLUCOSE 186* 138* 152*  BUN 15 19 18   CREATININE 1.23 1.16 1.09  CALCIUM 9.7 9.4 8.9  PROT  --  6.4*  --   ALBUMIN  --  3.6  --   AST  --  13*  --   ALT  --  16  --  ALKPHOS  --  63  --   BILITOT  --  0.5  --   GFRNONAA 60 >60 >60  GFRAA 69 >60 >60  ANIONGAP  --  9 7     Hematology Recent Labs  Lab 08/12/18 1437 08/15/18 0259 08/18/18 0355  WBC 5.6 5.6 7.0  RBC 4.57 4.29 4.57  HGB 13.9 13.3 14.0  HCT 41.0 38.2* 40.4  MCV 90 89.0 88.4  MCH 30.4 31.0 30.6  MCHC 33.9 34.8 34.7  RDW 13.3 12.6 12.7  PLT 228 178 200    Cardiac EnzymesNo results for input(s): TROPONINI in the last 168 hours. No results for input(s): TROPIPOC in the last 168 hours.   BNPNo results for input(s): BNP, PROBNP in the last 168 hours.   DDimer No results for input(s): DDIMER in the last 168 hours.   Radiology    No results Bryant.  Cardiac Studies   2D echocardiogram (8/29)  Study Conclusions  - Left ventricle: The cavity size was normal. There was moderate   concentric hypertrophy. Systolic function was normal. The   estimated ejection fraction was in the range of 60% to 65%.  Wall   motion was normal; there were no regional wall motion   abnormalities. Doppler parameters are consistent with abnormal   left ventricular relaxation (grade 1 diastolic dysfunction).   There was no evidence of elevated ventricular filling pressure by   Doppler parameters. - Aortic valve: There was no regurgitation. - Mitral valve: Calcified annulus. - Right ventricle: The cavity size was normal. Wall thickness was   normal. Systolic function was normal. - Right atrium: The atrium was normal in size. - Tricuspid valve: There was no regurgitation. - Inferior vena cava: The vessel was normal in size. - Pericardium, extracardiac: There was no pericardial effusion.  Cardiac catheterization (08/14/2018)   LEFT HEART CATH AND CORONARY ANGIOGRAPHY  Conclusion     Prox Cx lesion is 75% stenosed.  Mid Cx to Dist Cx lesion is 90% stenosed.  Ost 2nd Mrg lesion is 95% stenosed.  Ost 2nd Mrg to 2nd Mrg lesion is 70% stenosed.  Ost LAD to Prox LAD lesion is 95% stenosed.  Prox LAD lesion is 95% stenosed.  Ost Ramus to Ramus lesion is 90% stenosed.  Ramus lesion is 90% stenosed.  Prox RCA to Mid RCA lesion is 90% stenosed.  Dist RCA lesion is 95% stenosed.  The left ventricular systolic function is normal.  LV end diastolic pressure is normal.  The left ventricular ejection fraction is 55-65% by visual estimate.     Patient Profile     67 y.o. male w/ hx CVA on Plavix, DM, HTN, HLD, FH CAD, was seen by Dr Alex Bryant 08/27 for exertional CP/SOB and scheduled for cath on 08/29  Assessment & Plan    1: Coronary artery disease- accelerated angina with cardiac catheterization performed 08/14/2018 by myself radially revealing three-vessel disease with preserved LV function.  He was on Plavix for prior stroke 2 years ago.  He was kept in the hospital for "Plavix washout" in anticipation of coronary artery bypass grafting scheduled for tomorrow morning by Dr. Darcey Bryant.  2:  Diabetes- hemoglobin A1c 6.5.  Agree with potentially beginning Jardiance at discharge.  3: Hyperlipidemia- on high-dose statin therapy.  We will check a lipid liver profile after bypass surgery.  4: Essential hypertension- blood pressure well controlled on carvedilol and enalapril.  For questions or updates, please contact [facility] Please consult [email or web address] for contact info under Cardiology/STEMI.  Signed, Alex Burow, MD  08/18/2018, 7:45 AM

## 2018-08-18 NOTE — Progress Notes (Signed)
Pre-op Cardiac Surgery  Carotid Findings:  1-39% ICA plaquing. Vertebral artery flow is antegrade.   Upper Extremity Right Left  Brachial Pressures 130T 128T  Radial Waveforms T T  Ulnar Waveforms T T  Palmar Arch (Allen's Test) Doppler signal remains normal with radial compression and diminishes 50% with radial comrpession Doppler signal remains normal with radial compression and diminishes 50% with radial comrpession   Findings:      Lower  Extremity Right Left  Dorsalis Pedis 145T 146T  Anterior Tibial    Posterior Tibial 146T 153T  Ankle/Brachial Indices 1.12 1.18    Findings:  WNL

## 2018-08-18 NOTE — Progress Notes (Signed)
Patient ambulated in hall approx. 470 ft independantly

## 2018-08-18 NOTE — Progress Notes (Signed)
4 Days Post-Op Procedure(s) (LRB): LEFT HEART CATH AND CORONARY ANGIOGRAPHY (N/A) Subjective: No angina Pre cabg dopplers completed and are WNL P2Y12 > 200 CABG in am- I have discussed the expected benefits of CABG, he expected postop recovery and the potential risks including sroke, organ failure, bleeding requiring transfusion, and death. He understands and agrees to proceed with surgery in am  Objective: Vital signs in last 24 hours: Temp:  [97.9 F (36.6 C)-98.6 F (37 C)] 98.6 F (37 C) (09/02 1224) Pulse Rate:  [62-82] 67 (09/02 1224) Cardiac Rhythm: Normal sinus rhythm (09/02 0724) Resp:  [18] 18 (09/02 0503) BP: (121-147)/(64-77) 121/74 (09/02 1224) SpO2:  [95 %-96 %] 95 % (09/02 1224)  Hemodynamic parameters for last 24 hours:    Intake/Output from previous day: No intake/output data recorded. Intake/Output this shift: No intake/output data recorded.       Exam    General- alert and comfortable    Neck- no JVD, no cervical adenopathy palpable, no carotid bruit   Lungs- clear without rales, wheezes   Cor- regular rate and rhythm, no murmur , gallop   Abdomen- soft, non-tender   Extremities - warm, non-tender, minimal edema   Neuro- oriented, appropriate, no focal weakness   Lab Results: Recent Labs    08/18/18 0355  WBC 7.0  HGB 14.0  HCT 40.4  PLT 200   BMET:  Recent Labs    08/18/18 0355  NA 137  K 4.2  CL 106  CO2 24  GLUCOSE 152*  BUN 18  CREATININE 1.09  CALCIUM 8.9    PT/INR: No results for input(s): LABPROT, INR in the last 72 hours. ABG    Component Value Date/Time   PHART 7.437 08/18/2018 1028   HCO3 23.6 08/18/2018 1028   TCO2 25 11/19/2016 1335   ACIDBASEDEF 0.2 08/18/2018 1028   O2SAT 95.9 08/18/2018 1028   CBG (last 3)  Recent Labs    08/18/18 0639 08/18/18 1132 08/18/18 1621  GLUCAP 159* 123* 165*    Assessment/Plan: S/P Procedure(s) (LRB): LEFT HEART CATH AND CORONARY ANGIOGRAPHY (N/A) Multivessel cabg in  am   LOS: 4 days    [name] 08/18/2018

## 2018-08-19 ENCOUNTER — Inpatient Hospital Stay (HOSPITAL_COMMUNITY): Payer: PPO | Admitting: Anesthesiology

## 2018-08-19 ENCOUNTER — Encounter (HOSPITAL_COMMUNITY): Payer: Self-pay | Admitting: Certified Registered Nurse Anesthetist

## 2018-08-19 ENCOUNTER — Inpatient Hospital Stay (HOSPITAL_COMMUNITY)
Admission: AD | Disposition: A | Discharge status: Home / Self Care | Payer: Self-pay | Source: Home / Self Care | Attending: Cardiovascular Disease

## 2018-08-19 ENCOUNTER — Inpatient Hospital Stay (HOSPITAL_COMMUNITY): Payer: PPO

## 2018-08-19 DIAGNOSIS — Z951 Presence of aortocoronary bypass graft: Secondary | ICD-10-CM

## 2018-08-19 HISTORY — PX: TEE WITHOUT CARDIOVERSION: SHX5443

## 2018-08-19 HISTORY — PX: CORONARY ARTERY BYPASS GRAFT: SHX141

## 2018-08-19 LAB — CBC
HCT: 45.7 % (ref 39.0–52.0)
HCT: 30.2 % — ABNORMAL LOW (ref 39.0–52.0)
HEMOGLOBIN: 10.5 g/dL — AB (ref 13.0–17.0)
Hemoglobin: 15.6 g/dL (ref 13.0–17.0)
MCH: 30.5 pg (ref 26.0–34.0)
MCH: 30.6 pg (ref 26.0–34.0)
MCHC: 34.1 g/dL (ref 30.0–36.0)
MCHC: 34.8 g/dL (ref 30.0–36.0)
MCV: 89.3 fL (ref 78.0–100.0)
MCV: 88 fL (ref 78.0–100.0)
Platelets: 233 10*3/uL (ref 150–400)
Platelets: 145 10*3/uL — ABNORMAL LOW (ref 150–400)
RBC: 5.12 MIL/uL (ref 4.22–5.81)
RBC: 3.43 MIL/uL — AB (ref 4.22–5.81)
RDW: 12.8 % (ref 11.5–15.5)
RDW: 12.7 % (ref 11.5–15.5)
WBC: 9.4 10*3/uL (ref 4.0–10.5)
WBC: 9.4 10*3/uL (ref 4.0–10.5)

## 2018-08-19 LAB — POCT I-STAT, CHEM 8
BUN: 20 mg/dL (ref 8–23)
BUN: 16 mg/dL (ref 8–23)
BUN: 20 mg/dL (ref 8–23)
BUN: 17 mg/dL (ref 8–23)
BUN: 17 mg/dL (ref 8–23)
BUN: 17 mg/dL (ref 8–23)
BUN: 15 mg/dL (ref 8–23)
CALCIUM ION: 1.06 mmol/L — AB (ref 1.15–1.40)
CALCIUM ION: 1.09 mmol/L — AB (ref 1.15–1.40)
CALCIUM ION: 1.12 mmol/L — AB (ref 1.15–1.40)
CALCIUM ION: 1.15 mmol/L (ref 1.15–1.40)
CHLORIDE: 100 mmol/L (ref 98–111)
CHLORIDE: 104 mmol/L (ref 98–111)
CHLORIDE: 100 mmol/L (ref 98–111)
CREATININE: 0.8 mg/dL (ref 0.61–1.24)
CREATININE: 0.9 mg/dL (ref 0.61–1.24)
CREATININE: 0.7 mg/dL (ref 0.61–1.24)
CREATININE: 0.9 mg/dL (ref 0.61–1.24)
CREATININE: 1 mg/dL (ref 0.61–1.24)
Calcium, Ion: 1.28 mmol/L (ref 1.15–1.40)
Calcium, Ion: 1.3 mmol/L (ref 1.15–1.40)
Calcium, Ion: 1.1 mmol/L — ABNORMAL LOW (ref 1.15–1.40)
Chloride: 104 mmol/L (ref 98–111)
Chloride: 101 mmol/L (ref 98–111)
Chloride: 103 mmol/L (ref 98–111)
Chloride: 107 mmol/L (ref 98–111)
Creatinine, Ser: 0.7 mg/dL (ref 0.61–1.24)
Creatinine, Ser: 0.9 mg/dL (ref 0.61–1.24)
GLUCOSE: 120 mg/dL — AB (ref 70–99)
GLUCOSE: 131 mg/dL — AB (ref 70–99)
Glucose, Bld: 127 mg/dL — ABNORMAL HIGH (ref 70–99)
Glucose, Bld: 110 mg/dL — ABNORMAL HIGH (ref 70–99)
Glucose, Bld: 143 mg/dL — ABNORMAL HIGH (ref 70–99)
Glucose, Bld: 151 mg/dL — ABNORMAL HIGH (ref 70–99)
Glucose, Bld: 111 mg/dL — ABNORMAL HIGH (ref 70–99)
HCT: 34 % — ABNORMAL LOW (ref 39.0–52.0)
HCT: 26 % — ABNORMAL LOW (ref 39.0–52.0)
HCT: 27 % — ABNORMAL LOW (ref 39.0–52.0)
HCT: 26 % — ABNORMAL LOW (ref 39.0–52.0)
HCT: 28 % — ABNORMAL LOW (ref 39.0–52.0)
HEMATOCRIT: 35 % — AB (ref 39.0–52.0)
HEMATOCRIT: 26 % — AB (ref 39.0–52.0)
HEMOGLOBIN: 11.9 g/dL — AB (ref 13.0–17.0)
HEMOGLOBIN: 8.8 g/dL — AB (ref 13.0–17.0)
HEMOGLOBIN: 9.5 g/dL — AB (ref 13.0–17.0)
Hemoglobin: 11.6 g/dL — ABNORMAL LOW (ref 13.0–17.0)
Hemoglobin: 8.8 g/dL — ABNORMAL LOW (ref 13.0–17.0)
Hemoglobin: 8.8 g/dL — ABNORMAL LOW (ref 13.0–17.0)
Hemoglobin: 9.2 g/dL — ABNORMAL LOW (ref 13.0–17.0)
POTASSIUM: 4.1 mmol/L (ref 3.5–5.1)
POTASSIUM: 3.9 mmol/L (ref 3.5–5.1)
POTASSIUM: 4.4 mmol/L (ref 3.5–5.1)
Potassium: 4.5 mmol/L (ref 3.5–5.1)
Potassium: 4.5 mmol/L (ref 3.5–5.1)
Potassium: 4.2 mmol/L (ref 3.5–5.1)
Potassium: 4.3 mmol/L (ref 3.5–5.1)
SODIUM: 139 mmol/L (ref 135–145)
Sodium: 139 mmol/L (ref 135–145)
Sodium: 138 mmol/L (ref 135–145)
Sodium: 139 mmol/L (ref 135–145)
Sodium: 136 mmol/L (ref 135–145)
Sodium: 136 mmol/L (ref 135–145)
Sodium: 138 mmol/L (ref 135–145)
TCO2: 25 mmol/L (ref 22–32)
TCO2: 26 mmol/L (ref 22–32)
TCO2: 26 mmol/L (ref 22–32)
TCO2: 25 mmol/L (ref 22–32)
TCO2: 25 mmol/L (ref 22–32)
TCO2: 24 mmol/L (ref 22–32)
TCO2: 21 mmol/L — AB (ref 22–32)

## 2018-08-19 LAB — POCT I-STAT 3, ART BLOOD GAS (G3+)
Acid-base deficit: 2 mmol/L (ref 0.0–2.0)
Acid-base deficit: 2 mmol/L (ref 0.0–2.0)
Acid-base deficit: 3 mmol/L — ABNORMAL HIGH (ref 0.0–2.0)
Acid-base deficit: 4 mmol/L — ABNORMAL HIGH (ref 0.0–2.0)
Acid-base deficit: 3 mmol/L — ABNORMAL HIGH (ref 0.0–2.0)
Acid-base deficit: 2 mmol/L (ref 0.0–2.0)
BICARBONATE: 24 mmol/L (ref 20.0–28.0)
BICARBONATE: 23.3 mmol/L (ref 20.0–28.0)
BICARBONATE: 23.7 mmol/L (ref 20.0–28.0)
Bicarbonate: 22.8 mmol/L (ref 20.0–28.0)
Bicarbonate: 21.8 mmol/L (ref 20.0–28.0)
Bicarbonate: 22.1 mmol/L (ref 20.0–28.0)
O2 SAT: 100 %
O2 SAT: 100 %
O2 Saturation: 77 %
O2 Saturation: 95 %
O2 Saturation: 98 %
O2 Saturation: 96 %
PCO2 ART: 46.7 mmHg (ref 32.0–48.0)
PH ART: 7.297 — AB (ref 7.350–7.450)
PH ART: 7.348 — AB (ref 7.350–7.450)
PH ART: 7.34 — AB (ref 7.350–7.450)
PO2 ART: 359 mmHg — AB (ref 83.0–108.0)
PO2 ART: 84 mmHg (ref 83.0–108.0)
Patient temperature: 35.5
Patient temperature: 37.2
TCO2: 25 mmol/L (ref 22–32)
TCO2: 24 mmol/L (ref 22–32)
TCO2: 25 mmol/L (ref 22–32)
TCO2: 23 mmol/L (ref 22–32)
TCO2: 23 mmol/L (ref 22–32)
TCO2: 25 mmol/L (ref 22–32)
pCO2 arterial: 47.9 mmHg (ref 32.0–48.0)
pCO2 arterial: 42.7 mmHg (ref 32.0–48.0)
pCO2 arterial: 39.1 mmHg (ref 32.0–48.0)
pCO2 arterial: 40.9 mmHg (ref 32.0–48.0)
pCO2 arterial: 42.7 mmHg (ref 32.0–48.0)
pH, Arterial: 7.307 — ABNORMAL LOW (ref 7.350–7.450)
pH, Arterial: 7.345 — ABNORMAL LOW (ref 7.350–7.450)
pH, Arterial: 7.353 (ref 7.350–7.450)
pO2, Arterial: 221 mmHg — ABNORMAL HIGH (ref 83.0–108.0)
pO2, Arterial: 46 mmHg — ABNORMAL LOW (ref 83.0–108.0)
pO2, Arterial: 73 mmHg — ABNORMAL LOW (ref 83.0–108.0)
pO2, Arterial: 110 mmHg — ABNORMAL HIGH (ref 83.0–108.0)

## 2018-08-19 LAB — BASIC METABOLIC PANEL
Anion gap: 8 (ref 5–15)
Anion gap: 7 (ref 5–15)
BUN: 20 mg/dL (ref 8–23)
BUN: 15 mg/dL (ref 8–23)
CO2: 27 mmol/L (ref 22–32)
CO2: 22 mmol/L (ref 22–32)
Calcium: 9.6 mg/dL (ref 8.9–10.3)
Calcium: 7.9 mg/dL — ABNORMAL LOW (ref 8.9–10.3)
Chloride: 103 mmol/L (ref 98–111)
Chloride: 111 mmol/L (ref 98–111)
Creatinine, Ser: 1.2 mg/dL (ref 0.61–1.24)
Creatinine, Ser: 1 mg/dL (ref 0.61–1.24)
GFR calc Af Amer: >60 mL/min (ref >60)
GFR calc Af Amer: >60 mL/min (ref >60)
GFR calc non Af Amer: >60 mL/min (ref >60)
GFR calc non Af Amer: >60 mL/min (ref >60)
Glucose, Bld: 172 mg/dL — ABNORMAL HIGH (ref 70–99)
Glucose, Bld: 110 mg/dL — ABNORMAL HIGH (ref 70–99)
Potassium: 4.7 mmol/L (ref 3.5–5.1)
Potassium: 4.3 mmol/L (ref 3.5–5.1)
Sodium: 138 mmol/L (ref 135–145)
Sodium: 140 mmol/L (ref 135–145)

## 2018-08-19 LAB — CBC WITH DIFFERENTIAL/PLATELET
Abs Immature Granulocytes: 0 10*3/uL (ref 0.0–0.1)
Basophils Absolute: 0 10*3/uL (ref 0.0–0.1)
Basophils Relative: 0 %
Eosinophils Absolute: 0 10*3/uL (ref 0.0–0.7)
Eosinophils Relative: 0 %
HCT: 29.2 % — ABNORMAL LOW (ref 39.0–52.0)
Hemoglobin: 10.3 g/dL — ABNORMAL LOW (ref 13.0–17.0)
Immature Granulocytes: 0 %
Lymphocytes Relative: 7 %
Lymphs Abs: 0.6 10*3/uL — ABNORMAL LOW (ref 0.7–4.0)
MCH: 31 pg (ref 26.0–34.0)
MCHC: 35.3 g/dL (ref 30.0–36.0)
MCV: 88 fL (ref 78.0–100.0)
Monocytes Absolute: 0.7 10*3/uL (ref 0.1–1.0)
Monocytes Relative: 8 %
Neutro Abs: 7.8 10*3/uL — ABNORMAL HIGH (ref 1.7–7.7)
Neutrophils Relative %: 85 %
Platelets: 151 10*3/uL (ref 150–400)
RBC: 3.32 MIL/uL — ABNORMAL LOW (ref 4.22–5.81)
RDW: 12.6 % (ref 11.5–15.5)
WBC: 9.2 10*3/uL (ref 4.0–10.5)

## 2018-08-19 LAB — HEMOGLOBIN AND HEMATOCRIT, BLOOD
HCT: 29.7 % — ABNORMAL LOW (ref 39.0–52.0)
Hemoglobin: 10.2 g/dL — ABNORMAL LOW (ref 13.0–17.0)

## 2018-08-19 LAB — POCT I-STAT 4, (NA,K, GLUC, HGB,HCT)
Glucose, Bld: 116 mg/dL — ABNORMAL HIGH (ref 70–99)
HEMATOCRIT: 29 % — AB (ref 39.0–52.0)
HEMOGLOBIN: 9.9 g/dL — AB (ref 13.0–17.0)
Potassium: 3.8 mmol/L (ref 3.5–5.1)
Sodium: 141 mmol/L (ref 135–145)

## 2018-08-19 LAB — GLUCOSE, CAPILLARY
GLUCOSE-CAPILLARY: 164 mg/dL — AB (ref 70–99)
GLUCOSE-CAPILLARY: 115 mg/dL — AB (ref 70–99)
GLUCOSE-CAPILLARY: 110 mg/dL — AB (ref 70–99)
GLUCOSE-CAPILLARY: 126 mg/dL — AB (ref 70–99)
Glucose-Capillary: 123 mg/dL — ABNORMAL HIGH (ref 70–99)
Glucose-Capillary: 104 mg/dL — ABNORMAL HIGH (ref 70–99)
Glucose-Capillary: 103 mg/dL — ABNORMAL HIGH (ref 70–99)
Glucose-Capillary: 105 mg/dL — ABNORMAL HIGH (ref 70–99)
Glucose-Capillary: 115 mg/dL — ABNORMAL HIGH (ref 70–99)

## 2018-08-19 LAB — MAGNESIUM: Magnesium: 3 mg/dL — ABNORMAL HIGH (ref 1.7–2.4)

## 2018-08-19 LAB — PROTIME-INR
INR: 1.44
Prothrombin Time: 17.4 seconds — ABNORMAL HIGH (ref 11.4–15.2)

## 2018-08-19 LAB — PLATELET COUNT: Platelets: 146 10*3/uL — ABNORMAL LOW (ref 150–400)

## 2018-08-19 LAB — APTT: APTT: 31 s (ref 24–36)

## 2018-08-19 SURGERY — CORONARY ARTERY BYPASS GRAFTING (CABG)
Anesthesia: General | Site: Chest

## 2018-08-19 MED ORDER — PROPOFOL 10 MG/ML IV BOLUS
INTRAVENOUS | Status: DC | PRN
  Administered 2018-08-19: 50 mg via INTRAVENOUS
  Administered 2018-08-19: 100 mg via INTRAVENOUS

## 2018-08-19 MED ORDER — HEPARIN SODIUM (PORCINE) 1000 UNIT/ML IJ SOLN
INTRAMUSCULAR | Status: DC | PRN
  Administered 2018-08-19: 26 mL via INTRAVENOUS
  Administered 2018-08-19: 2 mL via INTRAVENOUS

## 2018-08-19 MED ORDER — SODIUM CHLORIDE 0.9% FLUSH
3.0000 mL | Freq: Two times a day (BID) | INTRAVENOUS | Status: DC
  Administered 2018-08-20 – 2018-08-23 (×4): 3 mL via INTRAVENOUS

## 2018-08-19 MED ORDER — VANCOMYCIN HCL IN DEXTROSE 1-5 GM/200ML-% IV SOLN
1000.0000 mg | Freq: Once | INTRAVENOUS | Status: AC
  Administered 2018-08-19: 1000 mg via INTRAVENOUS
  Filled 2018-08-19: qty 200

## 2018-08-19 MED ORDER — FAMOTIDINE IN NACL 20-0.9 MG/50ML-% IV SOLN
20.0000 mg | Freq: Two times a day (BID) | INTRAVENOUS | Status: AC
  Administered 2018-08-19 (×2): 20 mg via INTRAVENOUS
  Filled 2018-08-19: qty 50

## 2018-08-19 MED ORDER — INSULIN REGULAR BOLUS VIA INFUSION
0.0000 [IU] | Freq: Three times a day (TID) | INTRAVENOUS | Status: DC
  Filled 2018-08-19: qty 10

## 2018-08-19 MED ORDER — PLASMA-LYTE 148 IV SOLN
INTRAVENOUS | Status: DC | PRN
  Administered 2018-08-19: 500 mL

## 2018-08-19 MED ORDER — LACTATED RINGERS IV SOLN: 500.0000 mL | Freq: Once | INTRAVENOUS | Status: DC | PRN

## 2018-08-19 MED ORDER — MAGNESIUM SULFATE 4 GM/100ML IV SOLN
4.0000 g | Freq: Once | INTRAVENOUS | Status: AC
  Administered 2018-08-19: 4 g via INTRAVENOUS
  Filled 2018-08-19: qty 100

## 2018-08-19 MED ORDER — SODIUM CHLORIDE 0.45 % IV SOLN
INTRAVENOUS | Status: DC | PRN
  Administered 2018-08-19: 14:00:00 via INTRAVENOUS

## 2018-08-19 MED ORDER — SODIUM CHLORIDE 0.9 % IV SOLN
INTRAVENOUS | Status: DC | PRN
  Administered 2018-08-19: 20 ug/min via INTRAVENOUS

## 2018-08-19 MED ORDER — ONDANSETRON HCL 4 MG/2ML IJ SOLN
4.0000 mg | Freq: Four times a day (QID) | INTRAMUSCULAR | Status: DC | PRN
  Administered 2018-08-20: 4 mg via INTRAVENOUS
  Filled 2018-08-19: qty 2

## 2018-08-19 MED ORDER — LACTATED RINGERS IV SOLN
INTRAVENOUS | Status: DC | PRN
  Administered 2018-08-19: 07:00:00 via INTRAVENOUS

## 2018-08-19 MED ORDER — LACTATED RINGERS IV SOLN
INTRAVENOUS | Status: DC | PRN
  Administered 2018-08-19 (×2): via INTRAVENOUS

## 2018-08-19 MED ORDER — ROCURONIUM BROMIDE 50 MG/5ML IV SOSY
PREFILLED_SYRINGE | INTRAVENOUS | Status: AC
  Filled 2018-08-19: qty 15

## 2018-08-19 MED ORDER — ALBUMIN HUMAN 5 % IV SOLN
250.0000 mL | INTRAVENOUS | Status: AC | PRN
  Administered 2018-08-19 (×4): 250 mL via INTRAVENOUS
  Filled 2018-08-19 (×2): qty 250

## 2018-08-19 MED ORDER — LACTATED RINGERS IV SOLN
INTRAVENOUS | Status: DC
  Administered 2018-08-19: 14:00:00 via INTRAVENOUS

## 2018-08-19 MED ORDER — BISACODYL 10 MG RE SUPP: 10.0000 mg | Freq: Every day | RECTAL | Status: DC

## 2018-08-19 MED ORDER — SODIUM CHLORIDE 0.9 % IV SOLN
1.5000 g | Freq: Two times a day (BID) | INTRAVENOUS | Status: AC
  Administered 2018-08-19 – 2018-08-21 (×4): 1.5 g via INTRAVENOUS
  Filled 2018-08-19 (×4): qty 1.5

## 2018-08-19 MED ORDER — CHLORHEXIDINE GLUCONATE 0.12 % MT SOLN
15.0000 mL | OROMUCOSAL | Status: AC
  Administered 2018-08-19: 15 mL via OROMUCOSAL

## 2018-08-19 MED ORDER — PROPOFOL 10 MG/ML IV BOLUS
INTRAVENOUS | Status: AC
  Filled 2018-08-19: qty 20

## 2018-08-19 MED ORDER — SODIUM CHLORIDE 0.9% FLUSH
3.0000 mL | INTRAVENOUS | Status: DC | PRN
  Administered 2018-08-22: 3 mL via INTRAVENOUS
  Filled 2018-08-19: qty 3

## 2018-08-19 MED ORDER — LACTATED RINGERS IV SOLN: INTRAVENOUS | Status: DC

## 2018-08-19 MED ORDER — METOPROLOL TARTRATE 5 MG/5ML IV SOLN
2.5000 mg | INTRAVENOUS | Status: DC | PRN
  Administered 2018-08-22: 5 mg via INTRAVENOUS
  Filled 2018-08-19 (×2): qty 5

## 2018-08-19 MED ORDER — ASPIRIN 81 MG PO CHEW: 324.0000 mg | CHEWABLE_TABLET | Freq: Every day | ORAL | Status: DC

## 2018-08-19 MED ORDER — MORPHINE SULFATE (PF) 2 MG/ML IV SOLN
1.0000 mg | INTRAVENOUS | Status: DC | PRN
  Administered 2018-08-19 (×2): 2 mg via INTRAVENOUS
  Filled 2018-08-19 (×2): qty 1

## 2018-08-19 MED ORDER — ACETAMINOPHEN 160 MG/5ML PO SOLN: 1000.0000 mg | Freq: Four times a day (QID) | ORAL | Status: DC

## 2018-08-19 MED ORDER — 0.9 % SODIUM CHLORIDE (POUR BTL) OPTIME
TOPICAL | Status: DC | PRN
  Administered 2018-08-19: 5000 mL

## 2018-08-19 MED ORDER — FENTANYL CITRATE (PF) 250 MCG/5ML IJ SOLN
INTRAMUSCULAR | Status: AC
  Filled 2018-08-19: qty 25

## 2018-08-19 MED ORDER — MIDAZOLAM HCL 2 MG/2ML IJ SOLN
2.0000 mg | INTRAMUSCULAR | Status: DC | PRN
  Administered 2018-08-19: 2 mg via INTRAVENOUS
  Filled 2018-08-19: qty 2

## 2018-08-19 MED ORDER — MIDAZOLAM HCL 5 MG/5ML IJ SOLN
INTRAMUSCULAR | Status: DC | PRN
  Administered 2018-08-19 (×2): 2 mg via INTRAVENOUS
  Administered 2018-08-19: 1 mg via INTRAVENOUS
  Administered 2018-08-19: 2 mg via INTRAVENOUS

## 2018-08-19 MED ORDER — DOCUSATE SODIUM 100 MG PO CAPS
200.0000 mg | ORAL_CAPSULE | Freq: Every day | ORAL | Status: DC
  Administered 2018-08-20 – 2018-08-23 (×4): 200 mg via ORAL
  Filled 2018-08-19 (×4): qty 2

## 2018-08-19 MED ORDER — LIDOCAINE HCL (CARDIAC) PF 100 MG/5ML IV SOSY
PREFILLED_SYRINGE | INTRAVENOUS | Status: DC | PRN
  Administered 2018-08-19: 40 mg via INTRAVENOUS

## 2018-08-19 MED ORDER — ACETAMINOPHEN 160 MG/5ML PO SOLN: 650.0000 mg | Freq: Once | ORAL | Status: AC

## 2018-08-19 MED ORDER — MIDAZOLAM HCL 10 MG/2ML IJ SOLN
INTRAMUSCULAR | Status: AC
  Filled 2018-08-19: qty 2

## 2018-08-19 MED ORDER — ROCURONIUM BROMIDE 100 MG/10ML IV SOLN
INTRAVENOUS | Status: DC | PRN
  Administered 2018-08-19: 50 mg via INTRAVENOUS
  Administered 2018-08-19: 20 mg via INTRAVENOUS
  Administered 2018-08-19 (×3): 50 mg via INTRAVENOUS

## 2018-08-19 MED ORDER — NITROGLYCERIN IN D5W 200-5 MCG/ML-% IV SOLN: 0.0000 ug/min | INTRAVENOUS | Status: DC

## 2018-08-19 MED ORDER — METOPROLOL TARTRATE 12.5 MG HALF TABLET
12.5000 mg | ORAL_TABLET | Freq: Two times a day (BID) | ORAL | Status: DC
  Administered 2018-08-20: 12.5 mg via ORAL
  Filled 2018-08-19: qty 1

## 2018-08-19 MED ORDER — KETOROLAC TROMETHAMINE 15 MG/ML IJ SOLN
15.0000 mg | Freq: Four times a day (QID) | INTRAMUSCULAR | Status: AC
  Administered 2018-08-19 – 2018-08-20 (×4): 15 mg via INTRAVENOUS
  Filled 2018-08-19 (×4): qty 1

## 2018-08-19 MED ORDER — PHENYLEPHRINE 40 MCG/ML (10ML) SYRINGE FOR IV PUSH (FOR BLOOD PRESSURE SUPPORT)
PREFILLED_SYRINGE | INTRAVENOUS | Status: AC
  Filled 2018-08-19: qty 20

## 2018-08-19 MED ORDER — ACETAMINOPHEN 650 MG RE SUPP
650.0000 mg | Freq: Once | RECTAL | Status: AC
  Administered 2018-08-19: 650 mg via RECTAL

## 2018-08-19 MED ORDER — PANTOPRAZOLE SODIUM 40 MG PO TBEC
40.0000 mg | DELAYED_RELEASE_TABLET | Freq: Every day | ORAL | Status: DC
  Administered 2018-08-21 – 2018-08-23 (×3): 40 mg via ORAL
  Filled 2018-08-19 (×3): qty 1

## 2018-08-19 MED ORDER — SODIUM CHLORIDE 0.9 % IV SOLN
20.0000 ug | Freq: Once | INTRAVENOUS | Status: AC
  Administered 2018-08-19: 20 ug via INTRAVENOUS
  Filled 2018-08-19 (×2): qty 5

## 2018-08-19 MED ORDER — PHENYLEPHRINE HCL 10 MG/ML IJ SOLN
INTRAMUSCULAR | Status: DC | PRN
  Administered 2018-08-19: 80 ug via INTRAVENOUS

## 2018-08-19 MED ORDER — LIDOCAINE 2% (20 MG/ML) 5 ML SYRINGE
INTRAMUSCULAR | Status: AC
  Filled 2018-08-19: qty 5

## 2018-08-19 MED ORDER — FENTANYL CITRATE (PF) 250 MCG/5ML IJ SOLN
INTRAMUSCULAR | Status: DC | PRN
  Administered 2018-08-19 (×2): 250 ug via INTRAVENOUS
  Administered 2018-08-19: 50 ug via INTRAVENOUS
  Administered 2018-08-19: 200 ug via INTRAVENOUS
  Administered 2018-08-19: 250 ug via INTRAVENOUS

## 2018-08-19 MED ORDER — ROCURONIUM BROMIDE 50 MG/5ML IV SOSY
PREFILLED_SYRINGE | INTRAVENOUS | Status: AC
  Filled 2018-08-19: qty 5

## 2018-08-19 MED ORDER — MORPHINE SULFATE (PF) 2 MG/ML IV SOLN
2.0000 mg | INTRAVENOUS | Status: DC | PRN
  Administered 2018-08-20: 4 mg via INTRAVENOUS
  Administered 2018-08-20: 2 mg via INTRAVENOUS
  Administered 2018-08-20: 4 mg via INTRAVENOUS
  Administered 2018-08-20: 5 mg via INTRAVENOUS
  Administered 2018-08-20: 4 mg via INTRAVENOUS
  Filled 2018-08-19 (×3): qty 2
  Filled 2018-08-19: qty 3
  Filled 2018-08-19: qty 2
  Filled 2018-08-19: qty 1

## 2018-08-19 MED ORDER — SODIUM CHLORIDE 0.9 % IV SOLN
INTRAVENOUS | Status: DC
  Filled 2018-08-19 (×2): qty 1

## 2018-08-19 MED ORDER — OXYCODONE HCL 5 MG PO TABS
5.0000 mg | ORAL_TABLET | ORAL | Status: DC | PRN
  Administered 2018-08-20: 5 mg via ORAL
  Filled 2018-08-19: qty 1
  Filled 2018-08-19: qty 2

## 2018-08-19 MED ORDER — SODIUM CHLORIDE 0.9 % IV SOLN
INTRAVENOUS | Status: DC | PRN
  Administered 2018-08-19: 1 [IU]/h via INTRAVENOUS

## 2018-08-19 MED ORDER — EPHEDRINE SULFATE 50 MG/ML IJ SOLN
INTRAMUSCULAR | Status: DC | PRN
  Administered 2018-08-19: 10 mg via INTRAVENOUS

## 2018-08-19 MED ORDER — METOPROLOL TARTRATE 25 MG/10 ML ORAL SUSPENSION: 12.5000 mg | Freq: Two times a day (BID) | ORAL | Status: DC

## 2018-08-19 MED ORDER — SODIUM CHLORIDE 0.9 % IV SOLN
INTRAVENOUS | Status: DC
  Administered 2018-08-19: 14:00:00 via INTRAVENOUS

## 2018-08-19 MED ORDER — HEMOSTATIC AGENTS (NO CHARGE) OPTIME
TOPICAL | Status: DC | PRN
  Administered 2018-08-19: 1 via TOPICAL

## 2018-08-19 MED ORDER — SODIUM CHLORIDE 0.9 % IV SOLN: 250.0000 mL | INTRAVENOUS | Status: DC

## 2018-08-19 MED ORDER — DEXMEDETOMIDINE HCL IN NACL 200 MCG/50ML IV SOLN
0.0000 ug/kg/h | INTRAVENOUS | Status: DC
  Administered 2018-08-19: 0.7 ug/kg/h via INTRAVENOUS
  Filled 2018-08-19: qty 50

## 2018-08-19 MED ORDER — HEMOSTATIC AGENTS (NO CHARGE) OPTIME
TOPICAL | Status: DC | PRN
  Administered 2018-08-19: 2 via TOPICAL

## 2018-08-19 MED ORDER — SODIUM CHLORIDE 0.9 % IV SOLN
INTRAVENOUS | Status: DC | PRN
  Administered 2018-08-19: 0.2 ug/kg/h via INTRAVENOUS

## 2018-08-19 MED ORDER — ALBUMIN HUMAN 5 % IV SOLN
INTRAVENOUS | Status: DC | PRN
  Administered 2018-08-19 (×3): via INTRAVENOUS

## 2018-08-19 MED ORDER — ASPIRIN EC 325 MG PO TBEC: 325.0000 mg | DELAYED_RELEASE_TABLET | Freq: Every day | ORAL | Status: DC

## 2018-08-19 MED ORDER — ACETAMINOPHEN 500 MG PO TABS
1000.0000 mg | ORAL_TABLET | Freq: Four times a day (QID) | ORAL | Status: DC
  Administered 2018-08-19 – 2018-08-23 (×13): 1000 mg via ORAL
  Filled 2018-08-19 (×14): qty 2

## 2018-08-19 MED ORDER — TRAMADOL HCL 50 MG PO TABS
50.0000 mg | ORAL_TABLET | ORAL | Status: DC | PRN
  Administered 2018-08-20 – 2018-08-22 (×4): 100 mg via ORAL
  Filled 2018-08-19 (×4): qty 2

## 2018-08-19 MED ORDER — POTASSIUM CHLORIDE 10 MEQ/50ML IV SOLN
10.0000 meq | INTRAVENOUS | Status: AC
  Administered 2018-08-19 (×3): 10 meq via INTRAVENOUS

## 2018-08-19 MED ORDER — KETOROLAC TROMETHAMINE 30 MG/ML IJ SOLN
INTRAMUSCULAR | Status: AC
  Filled 2018-08-19: qty 1

## 2018-08-19 MED ORDER — BISACODYL 5 MG PO TBEC
10.0000 mg | DELAYED_RELEASE_TABLET | Freq: Every day | ORAL | Status: DC
  Administered 2018-08-20 – 2018-08-22 (×3): 10 mg via ORAL
  Filled 2018-08-19 (×4): qty 2

## 2018-08-19 MED ORDER — PROTAMINE SULFATE 10 MG/ML IV SOLN
INTRAVENOUS | Status: DC | PRN
  Administered 2018-08-19: 30 mg via INTRAVENOUS
  Administered 2018-08-19: 50 mg via INTRAVENOUS
  Administered 2018-08-19: 40 mg via INTRAVENOUS
  Administered 2018-08-19 (×2): 30 mg via INTRAVENOUS
  Administered 2018-08-19: 70 mg via INTRAVENOUS

## 2018-08-19 MED ORDER — PROTAMINE SULFATE 10 MG/ML IV SOLN
INTRAVENOUS | Status: AC
  Filled 2018-08-19: qty 25

## 2018-08-19 MED ORDER — HEPARIN SODIUM (PORCINE) 1000 UNIT/ML IJ SOLN
INTRAMUSCULAR | Status: AC
  Filled 2018-08-19: qty 1

## 2018-08-19 MED ORDER — MILRINONE LACTATE IN DEXTROSE 20-5 MG/100ML-% IV SOLN: 0.1250 ug/kg/min | INTRAVENOUS | Status: DC

## 2018-08-19 MED ORDER — LACTATED RINGERS IV SOLN
INTRAVENOUS | Status: DC | PRN
  Administered 2018-08-19 (×2): via INTRAVENOUS

## 2018-08-19 MED ORDER — PHENYLEPHRINE HCL-NACL 20-0.9 MG/250ML-% IV SOLN
0.0000 ug/min | INTRAVENOUS | Status: DC
  Filled 2018-08-19: qty 250

## 2018-08-19 MED ORDER — SODIUM CHLORIDE 0.9 % IJ SOLN
INTRAMUSCULAR | Status: AC
  Filled 2018-08-19: qty 10

## 2018-08-19 MED ORDER — ORAL CARE MOUTH RINSE
15.0000 mL | Freq: Two times a day (BID) | OROMUCOSAL | Status: DC
  Administered 2018-08-20 (×2): 15 mL via OROMUCOSAL

## 2018-08-19 MED ORDER — EPHEDRINE 5 MG/ML INJ
INTRAVENOUS | Status: AC
  Filled 2018-08-19: qty 10

## 2018-08-19 SURGICAL SUPPLY — 100 items
ADAPTER CARDIO PERF ANTE/RETRO (ADAPTER) ×4 IMPLANT
BAG DECANTER FOR FLEXI CONT (MISCELLANEOUS) ×4 IMPLANT
BANDAGE ACE 4X5 VEL STRL LF (GAUZE/BANDAGES/DRESSINGS) ×4 IMPLANT
BANDAGE ACE 6X5 VEL STRL LF (GAUZE/BANDAGES/DRESSINGS) ×4 IMPLANT
BANDAGE ELASTIC 4 VELCRO ST LF (GAUZE/BANDAGES/DRESSINGS) ×4 IMPLANT
BANDAGE ELASTIC 6 VELCRO ST LF (GAUZE/BANDAGES/DRESSINGS) ×4 IMPLANT
BASKET HEART  (ORDER IN 25'S) (MISCELLANEOUS) ×1
BASKET HEART (ORDER IN 25'S) (MISCELLANEOUS) ×1
BASKET HEART (ORDER IN 25S) (MISCELLANEOUS) ×2 IMPLANT
BLADE CLIPPER SURG (BLADE) ×4 IMPLANT
BLADE STERNUM SYSTEM 6 (BLADE) ×4 IMPLANT
BLADE SURG 12 STRL SS (BLADE) ×4 IMPLANT
BNDG GAUZE ELAST 4 BULKY (GAUZE/BANDAGES/DRESSINGS) ×4 IMPLANT
CANISTER SUCT 3000ML PPV (MISCELLANEOUS) ×4 IMPLANT
CANNULA ARTERIAL NVNT 3/8 20FR (MISCELLANEOUS) ×4 IMPLANT
CANNULA GUNDRY RCSP 15FR (MISCELLANEOUS) ×4 IMPLANT
CATH CPB KIT VANTRIGT (MISCELLANEOUS) ×4 IMPLANT
CATH ROBINSON RED A/P 18FR (CATHETERS) ×12 IMPLANT
CATH THORACIC 36FR RT ANG (CATHETERS) ×4 IMPLANT
CLIP FOGARTY SPRING 6M (CLIP) ×4 IMPLANT
CRADLE DONUT ADULT HEAD (MISCELLANEOUS) ×4 IMPLANT
DERMABOND ADVANCED (GAUZE/BANDAGES/DRESSINGS) ×2
DERMABOND ADVANCED .7 DNX12 (GAUZE/BANDAGES/DRESSINGS) ×2 IMPLANT
DRAIN CHANNEL 32F RND 10.7 FF (WOUND CARE) ×4 IMPLANT
DRAPE CARDIOVASCULAR INCISE (DRAPES) ×2
DRAPE SLUSH/WARMER DISC (DRAPES) ×4 IMPLANT
DRAPE SRG 135X102X78XABS (DRAPES) ×2 IMPLANT
DRSG AQUACEL AG ADV 3.5X14 (GAUZE/BANDAGES/DRESSINGS) ×4 IMPLANT
ELECT BLADE 4.0 EZ CLEAN MEGAD (MISCELLANEOUS) ×4
ELECT BLADE 6.5 EXT (BLADE) ×4 IMPLANT
ELECT CAUTERY BLADE 6.4 (BLADE) ×4 IMPLANT
ELECT REM PT RETURN 9FT ADLT (ELECTROSURGICAL) ×8
ELECTRODE BLDE 4.0 EZ CLN MEGD (MISCELLANEOUS) ×2 IMPLANT
ELECTRODE REM PT RTRN 9FT ADLT (ELECTROSURGICAL) ×4 IMPLANT
FELT TEFLON 1X6 (MISCELLANEOUS) ×4 IMPLANT
GAUZE SPONGE 4X4 12PLY STRL (GAUZE/BANDAGES/DRESSINGS) ×8 IMPLANT
GAUZE SPONGE 4X4 12PLY STRL LF (GAUZE/BANDAGES/DRESSINGS) ×8 IMPLANT
GLOVE BIO SURGEON STRL SZ7.5 (GLOVE) ×12 IMPLANT
GLOVE BIOGEL M STRL SZ7.5 (GLOVE) ×8 IMPLANT
GLOVE BIOGEL PI IND STRL 6 (GLOVE) ×2 IMPLANT
GLOVE BIOGEL PI IND STRL 8.5 (GLOVE) ×2 IMPLANT
GLOVE BIOGEL PI INDICATOR 6 (GLOVE) ×2
GLOVE BIOGEL PI INDICATOR 8.5 (GLOVE) ×2
GOWN STRL REUS W/ TWL LRG LVL3 (GOWN DISPOSABLE) ×16 IMPLANT
GOWN STRL REUS W/TWL LRG LVL3 (GOWN DISPOSABLE) ×16
HEMOSTAT POWDER SURGIFOAM 1G (HEMOSTASIS) IMPLANT
HEMOSTAT SURGICEL 2X14 (HEMOSTASIS) ×4 IMPLANT
INSERT FOGARTY XLG (MISCELLANEOUS) IMPLANT
KIT BASIN OR (CUSTOM PROCEDURE TRAY) ×4 IMPLANT
KIT SUCTION CATH 14FR (SUCTIONS) ×4 IMPLANT
KIT TURNOVER KIT B (KITS) ×4 IMPLANT
KIT VASOVIEW HEMOPRO VH 3000 (KITS) ×4 IMPLANT
LEAD PACING MYOCARDI (MISCELLANEOUS) ×4 IMPLANT
MARKER GRAFT CORONARY BYPASS (MISCELLANEOUS) ×12 IMPLANT
NS IRRIG 1000ML POUR BTL (IV SOLUTION) ×20 IMPLANT
PACK E OPEN HEART (SUTURE) ×4 IMPLANT
PACK OPEN HEART (CUSTOM PROCEDURE TRAY) ×4 IMPLANT
PAD ARMBOARD 7.5X6 YLW CONV (MISCELLANEOUS) ×8 IMPLANT
PAD ELECT DEFIB RADIOL ZOLL (MISCELLANEOUS) ×4 IMPLANT
PENCIL BUTTON HOLSTER BLD 10FT (ELECTRODE) ×4 IMPLANT
POWDER SURGICEL 3.0 GRAM (HEMOSTASIS) ×8 IMPLANT
PUNCH AORTIC ROTATE 4.0MM (MISCELLANEOUS) IMPLANT
PUNCH AORTIC ROTATE 4.5MM 8IN (MISCELLANEOUS) ×4 IMPLANT
PUNCH AORTIC ROTATE 5MM 8IN (MISCELLANEOUS) IMPLANT
SET CARDIOPLEGIA MPS 5001102 (MISCELLANEOUS) ×4 IMPLANT
SPONGE LAP 18X18 X RAY DECT (DISPOSABLE) ×4 IMPLANT
SPONGE LAP 4X18 RFD (DISPOSABLE) ×4 IMPLANT
SURGIFLO W/THROMBIN 8M KIT (HEMOSTASIS) ×8 IMPLANT
SUT BONE WAX W31G (SUTURE) ×4 IMPLANT
SUT MNCRL AB 4-0 PS2 18 (SUTURE) IMPLANT
SUT PROLENE 3 0 SH DA (SUTURE) IMPLANT
SUT PROLENE 3 0 SH1 36 (SUTURE) IMPLANT
SUT PROLENE 4 0 RB 1 (SUTURE) ×2
SUT PROLENE 4 0 SH DA (SUTURE) ×8 IMPLANT
SUT PROLENE 4-0 RB1 .5 CRCL 36 (SUTURE) ×2 IMPLANT
SUT PROLENE 5 0 C 1 36 (SUTURE) IMPLANT
SUT PROLENE 6 0 C 1 30 (SUTURE) ×20 IMPLANT
SUT PROLENE 6 0 CC (SUTURE) ×12 IMPLANT
SUT PROLENE 8 0 BV175 6 (SUTURE) ×8 IMPLANT
SUT PROLENE BLUE 7 0 (SUTURE) ×4 IMPLANT
SUT SILK  1 MH (SUTURE)
SUT SILK 1 MH (SUTURE) IMPLANT
SUT SILK 2 0 SH CR/8 (SUTURE) ×8 IMPLANT
SUT SILK 3 0 SH CR/8 (SUTURE) ×4 IMPLANT
SUT STEEL 6MS V (SUTURE) ×8 IMPLANT
SUT STEEL SZ 6 DBL 3X14 BALL (SUTURE) ×4 IMPLANT
SUT VIC AB 1 CTX 36 (SUTURE) ×6
SUT VIC AB 1 CTX36XBRD ANBCTR (SUTURE) ×6 IMPLANT
SUT VIC AB 2-0 CT1 27 (SUTURE) ×2
SUT VIC AB 2-0 CT1 TAPERPNT 27 (SUTURE) ×2 IMPLANT
SUT VIC AB 2-0 CTX 27 (SUTURE) IMPLANT
SUT VIC AB 3-0 X1 27 (SUTURE) ×4 IMPLANT
SYSTEM SAHARA CHEST DRAIN ATS (WOUND CARE) ×4 IMPLANT
TAPE CLOTH SURG 4X10 WHT LF (GAUZE/BANDAGES/DRESSINGS) ×4 IMPLANT
TOWEL GREEN STERILE (TOWEL DISPOSABLE) ×4 IMPLANT
TOWEL GREEN STERILE FF (TOWEL DISPOSABLE) ×4 IMPLANT
TRAY FOLEY SLVR 16FR TEMP STAT (SET/KITS/TRAYS/PACK) ×4 IMPLANT
TUBING INSUFFLATION (TUBING) ×4 IMPLANT
UNDERPAD 30X30 (UNDERPADS AND DIAPERS) ×4 IMPLANT
WATER STERILE IRR 1000ML POUR (IV SOLUTION) ×8 IMPLANT

## 2018-08-19 NOTE — Brief Op Note (Signed)
08/14/2018 - 08/19/2018  11:49 AM  PATIENT:  Alex Bryant  67 y.o. male  PRE-OPERATIVE DIAGNOSIS:  CAD  POST-OPERATIVE DIAGNOSIS:  CAD   PROCEDURE:  Procedure(s): CORONARY ARTERY BYPASS GRAFTING (CABG) x  USING LEFT INTERNAL MAMMARY ARTERY AND ENDOSCOPICALLY HARVESTED RIGHT SAPHENOUS VEIN. (N/A) TRANSESOPHAGEAL ECHOCARDIOGRAM (TEE) (N/A)  LIMA-LAD SEQ SVG-OM1-OM2 SVG-RAMUS SVG-PDA   SURGEON:  Surgeon(s) and Role:    Ivin Poot, MD - Primary  PHYSICIAN ASSISTANT: WAYNE GOLD PA-C  ANESTHESIA:   general  EBL: 400 cc  BLOOD ADMINISTERED:none  DRAINS: PLEURAL AND PERICARDIAL  CHEST TUBES   LOCAL MEDICATIONS USED:  NONE  SPECIMEN:  No Specimen  DISPOSITION OF SPECIMEN:  N/A  COUNTS:  YES  TOURNIQUET:  * No tourniquets in log *  DICTATION: .Other Dictation: Dictation Number PENDING  PLAN OF CARE: Admit to inpatient   PATIENT DISPOSITION:  ICU - intubated and hemodynamically stable.   Delay start of Pharmacological VTE agent (>24hrs) due to surgical blood loss or risk of bleeding: yes

## 2018-08-19 NOTE — Procedures (Signed)
Extubation Procedure Note  Patient Details:   Name: Alex Bryant DOB: 12/18/1950 MRN: [id number]   Airway Documentation:    Vent end date: 08/19/18 Vent end time: 1738   Evaluation  O2 sats: stable throughout Complications: No apparent complications Patient did tolerate procedure well. Bilateral Breath Sounds: Clear   Yes   Patient had a VC of 1.1L and a NIF of -25. Cuff leak was heard. No stridor was noted. RN was at the bedside with RT during extubation. Patient was placed on a 4L [place].  Tiburcio Bash 08/19/2018, 5:48 PM

## 2018-08-19 NOTE — Progress Notes (Signed)
  Echocardiogram Echocardiogram Transesophageal has been performed.  Alex Bryant M 08/19/2018, 8:28 AM

## 2018-08-19 NOTE — Anesthesia Procedure Notes (Signed)
Central Venous Catheter Insertion Performed by: Catalina Gravel, MD, anesthesiologist Start/End9/02/2018 6:50 AM, 08/19/2018 6:55 AM Patient location: Pre-op. Preanesthetic checklist: patient identified, IV checked, site marked, risks and benefits discussed, surgical consent, monitors and equipment checked, pre-op evaluation, timeout performed and anesthesia consent Hand hygiene performed  and maximum sterile barriers used  Total catheter length 100. PA cath was placed.Swan type:thermodilution PA Cath depth:57 Procedure performed without using ultrasound guided technique. Attempts: 1 Patient tolerated the procedure well with no immediate complications.

## 2018-08-19 NOTE — Progress Notes (Signed)
Pre Procedure note for inpatients:   Alex Bryant has been scheduled for Procedure(s): CORONARY ARTERY BYPASS GRAFTING (CABG) (N/A) TRANSESOPHAGEAL ECHOCARDIOGRAM (TEE) (N/A) today. The various methods of treatment have been discussed with the patient. After consideration of the risks, benefits and treatment options the patient has consented to the planned procedure.   The patient has been seen and labs reviewed. There are no changes in the patient's condition to prevent proceeding with the planned procedure today.  Recent labs:  Lab Results  Component Value Date   WBC 9.4 08/19/2018   HGB 15.6 08/19/2018   HCT 45.7 08/19/2018   PLT 233 08/19/2018   GLUCOSE 172 (H) 08/19/2018   CHOL 141 05/22/2018   TRIG 161.0 (H) 05/22/2018   HDL 39.00 (L) 05/22/2018   LDLDIRECT 153.3 09/08/2007   LDLCALC 70 05/22/2018   ALT 16 08/15/2018   AST 13 (L) 08/15/2018   NA 138 08/19/2018   K 4.7 08/19/2018   CL 103 08/19/2018   CREATININE 1.20 08/19/2018   BUN 20 08/19/2018   CO2 27 08/19/2018   TSH 1.24 04/19/2017   PSA 1.04 04/19/2017   INR 1.10 08/15/2018   HGBA1C 6.5 (H) 08/15/2018   MICROALBUR 1.2 05/25/2015    Len Childs, MD 08/19/2018 6:57 AM

## 2018-08-19 NOTE — Anesthesia Procedure Notes (Signed)
Arterial Line Insertion Start/End9/02/2018 7:05 AM, 08/19/2018 7:15 AM Performed by: Seri Kimmer T, Immunologist, CRNA  Patient location: Pre-op. Preanesthetic checklist: patient identified, IV checked, site marked, risks and benefits discussed, surgical consent, monitors and equipment checked and pre-op evaluation Lidocaine 1% used for infiltration and patient sedated Left, radial was placed Catheter size: 20 G  Attempts: 1 Procedure performed without using ultrasound guided technique. Following insertion, dressing applied and Biopatch. Post procedure assessment: normal  Patient tolerated the procedure well with no immediate complications.

## 2018-08-19 NOTE — Anesthesia Procedure Notes (Signed)
Procedure Name: Intubation Date/Time: 08/19/2018 8:00 AM Performed by: Tyrik Stetzer T, CRNA Pre-anesthesia Checklist: Patient identified, Emergency Drugs available, Suction available and Patient being monitored Patient Re-evaluated:Patient Re-evaluated prior to induction Oxygen Delivery Method: Circle system utilized Preoxygenation: Pre-oxygenation with 100% oxygen Induction Type: IV induction Ventilation: Mask ventilation with difficulty, Oral airway inserted - appropriate to patient size and Two handed mask ventilation required Laryngoscope Size: Glidescope and 4 Grade View: Grade I Tube type: Subglottic suction tube Tube size: 8.0 mm Number of attempts: 1 Airway Equipment and Method: Patient positioned with wedge pillow,  Stylet and Video-laryngoscopy Placement Confirmation: ETT inserted through vocal cords under direct vision,  positive ETCO2 and breath sounds checked- equal and bilateral Secured at: 22 cm Tube secured with: Tape Dental Injury: Teeth and Oropharynx as per pre-operative assessment

## 2018-08-19 NOTE — Anesthesia Procedure Notes (Signed)
Central Venous Catheter Insertion Performed by: Catalina Gravel, MD, anesthesiologist Start/End9/02/2018 6:40 AM, 08/19/2018 6:50 AM Patient location: Pre-op. Preanesthetic checklist: patient identified, IV checked, site marked, risks and benefits discussed, surgical consent, monitors and equipment checked, pre-op evaluation, timeout performed and anesthesia consent Lidocaine 1% used for infiltration and patient sedated Hand hygiene performed  and maximum sterile barriers used  Catheter size: 8.5 Fr Central line was placed.Sheath introducer Procedure performed using ultrasound guided technique. Ultrasound Notes:anatomy identified, needle tip was noted to be adjacent to the nerve/plexus identified, no ultrasound evidence of intravascular and/or intraneural injection and image(s) printed for medical record Attempts: 1 Following insertion, line sutured, dressing applied and Biopatch. Post procedure assessment: blood return through all ports, free fluid flow and no air  Patient tolerated the procedure well with no immediate complications.

## 2018-08-19 NOTE — Op Note (Signed)
NAME: Alex Bryant, Alex Bryant. MEDICAL RECORD YP:[id number] ACCOUNT [id number] DATE OF BIRTH:[birth year] FACILITY: [facility] LOCATION: [facility] PHYSICIAN:Willmar Stockinger VAN TRIGT III, MD  OPERATIVE REPORT  DATE OF PROCEDURE:  08/19/2018  OPERATION: 1.  Coronary artery bypass grafting x5 (left internal mammary artery to left anterior descending, saphenous vein graft to ramus intermediate, sequential saphenous vein graft to obtuse marginal 1 and obtuse marginal 2, saphenous vein graft to posterior  descending. 2.  Endoscopic harvest of right leg greater saphenous vein.  SURGEON:  Ivin Poot, III, MD  ASSISTANT:  Jadene Pierini PA-C.  PREOPERATIVE DIAGNOSES:  Severe 3-vessel coronary artery disease, unstable angina.  POSTOPERATIVE DIAGNOSES:  Severe 3-vessel coronary artery disease, unstable angina.  ANESTHESIA:  General by Dr. Roberts Gaudy.  CLINICAL NOTE:  The patient is a 67 year old hypertensive diabetic male with dyslipidemia and recent symptoms of unstable angina.  Cardiac catheterization demonstrated severe 3-vessel coronary artery disease with preserved LV function.  Echocardiogram  showed no significant valvular disease.  He was recommended for heart bypass surgery.  I saw the patient in consultation in the hospital after his cardiac catheterization after reviewing his coronary angiogram films.  I agreed with the recommendation for  CABG and discussed the procedure in detail with the patient and his family.  We discussed the details of the operation including the use of general anesthesia and cardiopulmonary bypass, the location of the surgical incisions, the expected postoperative  hospital recovery, and the long-term benefits of surgery including improved survival preservation of LV function and improvement in symptoms.  I discussed the potential risks of CABG with the patient and his family including the risk of stroke,  bleeding, infection, blood transfusion, postoperative arrhythmia, postoperative  pulmonary problems including pleural effusion, infection and death.  He demonstrated his understanding and agreed to proceed with surgery and what I felt was informed  consent.  OPERATIVE FINDINGS: 1.  Severe calcified diffuse coronary disease of the LAD, ramus vessels. 2.  Adequate conduit. 3.  No blood products required for the surgery.  DESCRIPTION OF PROCEDURE:  The patient was brought to the operating room and placed supine on the operating table and general anesthesia was induced under invasive hemodynamic monitoring.  A transesophageal echo probe was placed by the anesthesia team.   The chest, abdomen and legs were prepped with Betadine, draped as a sterile field.  A sternal incision was made as the saphenous vein was harvested endoscopically from the right leg.  The left internal mammary artery was harvested as a pedicle graft from  its origin at the subclavian vessels.  It was 1.5 mm vessel with good flow.  The sternal retractor was placed and the pericardium opened and suspended.  Pursestrings were placed in the ascending aorta and right atrium and after heparin was administered  and ACT was documented as being therapeutic, the patient was cannulated and placed on cardiopulmonary bypass.  The coronaries were identified for grafting, and the mammary artery and vein grafts were prepared for the distal anastomoses.  Cardioplegic  cannulas were placed in both antegrade and retrograde cold blood cardioplegia.  The patient was cooled to 32 degrees.  The aortic crossclamp was applied.  One liter of cold blood cardioplegia was delivered in split doses between the antegrade aortic and  retrograde coronary sinus catheters.  There was good cardioplegic arrest and temperature dropped less than 12 degrees.  The distal coronary anastomoses were then performed.  The first distal anastomosis was the posterior descending.  This was a small 1.2 mm vessel,  proximal 95% stenosis.  Reverse saphenous vein was  sewn end-to-side with running 7-0 Prolene with good flow  through the graft.  Cardioplegia was redosed.  The second distal anastomosis was the sequential vein graft to the OM1 and OM2.  The OM1 was a 1.5 mm vessel, proximal 95% stenosis.  The OM2 was a smaller 1.2 mm vessel also with proximal 95% stenosis.  The OM1 anastomosis was created side-to-side with  running 7-0 Prolene, and the vein was then continued to the OM2 in an end-to-side anastomosis with running 8-0 Prolene.  Cardioplegia was delivered through the vein grafts.  The fourth distal anastomosis was the ramus intermediate.  This had a significant calcified plaque.  It became intramyocardial.  The anastomosis was created in an area of minimal disease with a reverse saphenous vein using running 7-0 Prolene.  There was  good flow through the graft.  Cardioplegia was redosed.  The fifth and final distal anastomosis was the distal third of the LAD.  There was a proximal 95% stenosis.  The left IMA pedicle was brought through an opening in the left lateral pericardium and was brought down onto the LAD and sewn end-to-side with  running 8-0 Prolene.  There was good flow through the anastomosis after briefly releasing the pedicle bulldog and the mammary artery.  The bulldog was reapplied and the pedicle was secured to the epicardium with 6-0 Prolenes.  Cardioplegia was redosed.  While the cross clamp was still in place, 3 proximal vein anastomoses were performed on the ascending aorta using a 4.5 mm punch and running 6-0 Prolene.  Prior to tying down the final proximal anastomosis, air was vented from the coronaries with a dose  of retrograde warm blood cardioplegia.  The crossclamp was removed.  The vein grafts were deaired and opened.  Each had good flow, and hemostasis was documented at the proximal and distal sites.  The heart resumed a spontaneous rhythm.  The patient was rewarmed and reperfused.  Temporary pacing wires were applied.  The   lungs were expanded and the ventilator was resumed.  The patient was weaned from cardiopulmonary bypass without difficulty.  Echo showed preserved LV function.  Hemostasis was achieved with topical agents and electrocautery and irrigation.  The superior  pericardial fat was closed over the aorta and vein grafts.  Anterior mediastinal left pleural chest tubes were placed and brought out through separate incisions.  The sternum was closed with a wire.  The pectoralis fascia was closed with a running #1  Vicryl.  Subcutaneous and skin layers were closed with running Vicryl and sterile dressings were applied.  The patient returned to the ICU in stable condition.  Total cardiopulmonary bypass time was 135 minutes.  AN/NUANCE  D:08/19/2018 T:08/19/2018 JOB:[id number]

## 2018-08-19 NOTE — Anesthesia Preprocedure Evaluation (Signed)
Anesthesia Evaluation  Patient identified by MRN, date of birth, ID band Patient awake    Reviewed: Allergy & Precautions, NPO status , Patient's Chart, lab work & pertinent test results  Airway Mallampati: II  TM Distance: >3 FB Neck ROM: Full    Dental  (+) Teeth Intact, Dental Advisory Given   Pulmonary former smoker,    breath sounds clear to auscultation       Cardiovascular hypertension,  Rhythm:Regular Rate:Normal     Neuro/Psych    GI/Hepatic   Endo/Other  diabetes  Renal/GU      Musculoskeletal   Abdominal   Peds  Hematology   Anesthesia Other Findings   Reproductive/Obstetrics                             Anesthesia Physical Anesthesia Plan  ASA: IV  Anesthesia Plan: General   Post-op Pain Management:    Induction: Intravenous  PONV Risk Score and Plan: Ondansetron and Dexamethasone  Airway Management Planned: Oral ETT  Additional Equipment: Arterial line, PA Cath, 3D TEE and Ultrasound Guidance Line Placement  Intra-op Plan:   Post-operative Plan: Post-operative intubation/ventilation  Informed Consent: I have reviewed the patients History and Physical, chart, labs and discussed the procedure including the risks, benefits and alternatives for the proposed anesthesia with the patient or authorized representative who has indicated his/her understanding and acceptance.   Dental advisory given  Plan Discussed with: CRNA and Anesthesiologist  Anesthesia Plan Comments:         Anesthesia Quick Evaluation

## 2018-08-19 NOTE — Progress Notes (Signed)
Patient ID: Alex Bryant, male   DOB: 10-24-1951, 67 y.o.   MRN: [id number]  TCTS Evening Rounds:   Hemodynamically stable  CI = 1.8 on milrinone  Extubated.  Urine output good  CT output low  CBC    Component Value Date/Time   WBC 9.4 08/19/2018 1403   RBC 3.43 (L) 08/19/2018 1403   HGB 10.5 (L) 08/19/2018 1403   HGB 13.9 08/12/2018 1437   HCT 30.2 (L) 08/19/2018 1403   HCT 41.0 08/12/2018 1437   PLT 145 (L) 08/19/2018 1403   PLT 228 08/12/2018 1437   MCV 88.0 08/19/2018 1403   MCV 90 08/12/2018 1437   MCH 30.6 08/19/2018 1403   MCHC 34.8 08/19/2018 1403   RDW 12.7 08/19/2018 1403   RDW 13.3 08/12/2018 1437   LYMPHSABS 1.3 04/19/2017 0754   MONOABS 0.4 04/19/2017 0754   EOSABS 0.5 04/19/2017 0754   BASOSABS 0.1 04/19/2017 0754     BMET    Component Value Date/Time   NA 138 08/19/2018 1235   NA 138 08/12/2018 1437   K 4.2 08/19/2018 1235   CL 103 08/19/2018 1235   CO2 27 08/19/2018 0505   GLUCOSE 151 (H) 08/19/2018 1235   BUN 17 08/19/2018 1235   BUN 15 08/12/2018 1437   CREATININE 0.90 08/19/2018 1235   CALCIUM 9.6 08/19/2018 0505   GFRNONAA >60 08/19/2018 0505   GFRAA >60 08/19/2018 0505     A/P:  Stable postop course. Continue current plans

## 2018-08-19 NOTE — Progress Notes (Signed)
Pt transferred to surgery holding with pt's wife via Brewing technologist per holding staff and [place] RN. Pt ao x 4, breathing even and unlabored, denies any pain at the time of transfer. Bed side report given to [place] notified. Pt's wife took belongings.

## 2018-08-19 NOTE — Transfer of Care (Signed)
Immediate Anesthesia Transfer of Care Note  Patient: RISHAN OYAMA  Procedure(s) Performed: CORONARY ARTERY BYPASS GRAFTING (CABG) x 5  USING LEFT INTERNAL MAMMARY ARTERY AND ENDOSCOPICALLY HARVESTED RIGHT SAPHENOUS VEIN. (N/A Chest) TRANSESOPHAGEAL ECHOCARDIOGRAM (TEE) (N/A )  Patient Location: ICU  Anesthesia Type:General  Level of Consciousness: Patient remains intubated per anesthesia plan  Airway & Oxygen Therapy: Patient remains intubated per anesthesia plan and Patient placed on Ventilator (see vital sign flow sheet for setting)  Post-op Assessment: Report given to RN and Post -op Vital signs reviewed and stable  Post vital signs: Reviewed and stable  Last Vitals:  Vitals Value Taken Time  BP    Temp 35.5 C 08/19/2018  2:18 PM  Pulse 89 08/19/2018  2:18 PM  Resp 33 08/19/2018  2:18 PM  SpO2 97 % 08/19/2018  2:18 PM  Vitals shown include unvalidated device data.  Last Pain:  Vitals:   08/19/18 0357  TempSrc: Oral  PainSc: 0-No pain         Complications: No apparent anesthesia complications

## 2018-08-20 ENCOUNTER — Encounter (HOSPITAL_COMMUNITY): Payer: Self-pay | Admitting: Cardiothoracic Surgery

## 2018-08-20 ENCOUNTER — Inpatient Hospital Stay (HOSPITAL_COMMUNITY): Payer: PPO

## 2018-08-20 DIAGNOSIS — Z951 Presence of aortocoronary bypass graft: Secondary | ICD-10-CM

## 2018-08-20 LAB — BASIC METABOLIC PANEL
Anion gap: 6 (ref 5–15)
BUN: 15 mg/dL (ref 8–23)
CHLORIDE: 111 mmol/L (ref 98–111)
CO2: 22 mmol/L (ref 22–32)
Calcium: 8 mg/dL — ABNORMAL LOW (ref 8.9–10.3)
Creatinine, Ser: 1.01 mg/dL (ref 0.61–1.24)
GFR calc Af Amer: >60 mL/min (ref >60)
GFR calc non Af Amer: >60 mL/min (ref >60)
Glucose, Bld: 108 mg/dL — ABNORMAL HIGH (ref 70–99)
POTASSIUM: 4.1 mmol/L (ref 3.5–5.1)
SODIUM: 139 mmol/L (ref 135–145)

## 2018-08-20 LAB — CBC
HEMATOCRIT: 28.8 % — AB (ref 39.0–52.0)
HEMATOCRIT: 31.7 % — AB (ref 39.0–52.0)
HEMOGLOBIN: 9.9 g/dL — AB (ref 13.0–17.0)
Hemoglobin: 10.6 g/dL — ABNORMAL LOW (ref 13.0–17.0)
MCH: 30.7 pg (ref 26.0–34.0)
MCH: 30.4 pg (ref 26.0–34.0)
MCHC: 34.4 g/dL (ref 30.0–36.0)
MCHC: 33.4 g/dL (ref 30.0–36.0)
MCV: 89.2 fL (ref 78.0–100.0)
MCV: 90.8 fL (ref 78.0–100.0)
Platelets: 146 10*3/uL — ABNORMAL LOW (ref 150–400)
Platelets: 144 10*3/uL — ABNORMAL LOW (ref 150–400)
RBC: 3.23 MIL/uL — AB (ref 4.22–5.81)
RBC: 3.49 MIL/uL — ABNORMAL LOW (ref 4.22–5.81)
RDW: 12.8 % (ref 11.5–15.5)
RDW: 13.1 % (ref 11.5–15.5)
WBC: 7.7 10*3/uL (ref 4.0–10.5)
WBC: 8.8 10*3/uL (ref 4.0–10.5)

## 2018-08-20 LAB — GLUCOSE, CAPILLARY
GLUCOSE-CAPILLARY: 106 mg/dL — AB (ref 70–99)
GLUCOSE-CAPILLARY: 106 mg/dL — AB (ref 70–99)
GLUCOSE-CAPILLARY: 111 mg/dL — AB (ref 70–99)
GLUCOSE-CAPILLARY: 132 mg/dL — AB (ref 70–99)
GLUCOSE-CAPILLARY: 158 mg/dL — AB (ref 70–99)
GLUCOSE-CAPILLARY: 161 mg/dL — AB (ref 70–99)
GLUCOSE-CAPILLARY: 169 mg/dL — AB (ref 70–99)
GLUCOSE-CAPILLARY: 184 mg/dL — AB (ref 70–99)
Glucose-Capillary: 103 mg/dL — ABNORMAL HIGH (ref 70–99)
Glucose-Capillary: 103 mg/dL — ABNORMAL HIGH (ref 70–99)
Glucose-Capillary: 107 mg/dL — ABNORMAL HIGH (ref 70–99)
Glucose-Capillary: 107 mg/dL — ABNORMAL HIGH (ref 70–99)
Glucose-Capillary: 112 mg/dL — ABNORMAL HIGH (ref 70–99)
Glucose-Capillary: 134 mg/dL — ABNORMAL HIGH (ref 70–99)
Glucose-Capillary: 143 mg/dL — ABNORMAL HIGH (ref 70–99)
Glucose-Capillary: 155 mg/dL — ABNORMAL HIGH (ref 70–99)

## 2018-08-20 LAB — CREATININE, SERUM: Creatinine, Ser: 1.12 mg/dL (ref 0.61–1.24)

## 2018-08-20 LAB — MAGNESIUM
MAGNESIUM: 2.4 mg/dL (ref 1.7–2.4)
Magnesium: 2.3 mg/dL (ref 1.7–2.4)

## 2018-08-20 LAB — POCT I-STAT, CHEM 8
BUN: 14 mg/dL (ref 8–23)
CHLORIDE: 102 mmol/L (ref 98–111)
Calcium, Ion: 1.2 mmol/L (ref 1.15–1.40)
Creatinine, Ser: 1.1 mg/dL (ref 0.61–1.24)
Glucose, Bld: 167 mg/dL — ABNORMAL HIGH (ref 70–99)
HCT: 29 % — ABNORMAL LOW (ref 39.0–52.0)
HEMOGLOBIN: 9.9 g/dL — AB (ref 13.0–17.0)
Potassium: 4.3 mmol/L (ref 3.5–5.1)
Sodium: 136 mmol/L (ref 135–145)
TCO2: 23 mmol/L (ref 22–32)

## 2018-08-20 MED ORDER — ROSUVASTATIN CALCIUM 10 MG PO TABS
40.0000 mg | ORAL_TABLET | Freq: Every day | ORAL | Status: DC
  Administered 2018-08-20 – 2018-08-22 (×3): 40 mg via ORAL
  Filled 2018-08-20 (×2): qty 1
  Filled 2018-08-20: qty 4

## 2018-08-20 MED ORDER — FUROSEMIDE 10 MG/ML IJ SOLN
20.0000 mg | Freq: Once | INTRAMUSCULAR | Status: AC
  Administered 2018-08-20: 20 mg via INTRAVENOUS
  Filled 2018-08-20: qty 2

## 2018-08-20 MED ORDER — RAMIPRIL 2.5 MG PO CAPS
2.5000 mg | ORAL_CAPSULE | Freq: Every day | ORAL | Status: DC
  Administered 2018-08-20: 2.5 mg via ORAL
  Filled 2018-08-20 (×2): qty 1

## 2018-08-20 MED ORDER — ENOXAPARIN SODIUM 40 MG/0.4ML ~~LOC~~ SOLN
40.0000 mg | Freq: Every day | SUBCUTANEOUS | Status: DC
  Administered 2018-08-20 – 2018-08-21 (×2): 40 mg via SUBCUTANEOUS
  Filled 2018-08-20 (×2): qty 0.4

## 2018-08-20 MED ORDER — POTASSIUM CHLORIDE CRYS ER 20 MEQ PO TBCR
20.0000 meq | EXTENDED_RELEASE_TABLET | Freq: Two times a day (BID) | ORAL | Status: DC
  Administered 2018-08-20 (×2): 20 meq via ORAL
  Filled 2018-08-20 (×2): qty 1

## 2018-08-20 MED ORDER — VANCOMYCIN HCL IN DEXTROSE 1-5 GM/200ML-% IV SOLN
1000.0000 mg | Freq: Once | INTRAVENOUS | Status: AC
  Administered 2018-08-20: 1000 mg via INTRAVENOUS
  Filled 2018-08-20: qty 200

## 2018-08-20 MED ORDER — OXYCODONE HCL 5 MG PO TABS
10.0000 mg | ORAL_TABLET | ORAL | Status: DC | PRN
  Administered 2018-08-20 (×2): 10 mg via ORAL
  Filled 2018-08-20 (×2): qty 2

## 2018-08-20 MED ORDER — MENTHOL 3 MG MT LOZG
1.0000 | LOZENGE | OROMUCOSAL | Status: DC | PRN
  Filled 2018-08-20: qty 9

## 2018-08-20 MED ORDER — INSULIN DETEMIR 100 UNIT/ML ~~LOC~~ SOLN
10.0000 [IU] | Freq: Two times a day (BID) | SUBCUTANEOUS | Status: DC
  Administered 2018-08-20 (×2): 10 [IU] via SUBCUTANEOUS
  Filled 2018-08-20 (×3): qty 0.1

## 2018-08-20 MED ORDER — ALBUMIN HUMAN 5 % IV SOLN
INTRAVENOUS | Status: AC
  Filled 2018-08-20: qty 500

## 2018-08-20 MED ORDER — INSULIN ASPART 100 UNIT/ML ~~LOC~~ SOLN
0.0000 [IU] | SUBCUTANEOUS | Status: DC
  Administered 2018-08-20: 2 [IU] via SUBCUTANEOUS
  Administered 2018-08-20: 4 [IU] via SUBCUTANEOUS
  Administered 2018-08-20 – 2018-08-21 (×2): 2 [IU] via SUBCUTANEOUS

## 2018-08-20 MED ORDER — CLOPIDOGREL BISULFATE 75 MG PO TABS
75.0000 mg | ORAL_TABLET | Freq: Every day | ORAL | Status: DC
  Administered 2018-08-21: 75 mg via ORAL
  Filled 2018-08-20: qty 1

## 2018-08-20 MED ORDER — ASPIRIN EC 81 MG PO TBEC
81.0000 mg | DELAYED_RELEASE_TABLET | Freq: Every day | ORAL | Status: DC
  Administered 2018-08-20 – 2018-08-23 (×4): 81 mg via ORAL
  Filled 2018-08-20 (×4): qty 1

## 2018-08-20 MED ORDER — METOPROLOL TARTRATE 25 MG PO TABS
25.0000 mg | ORAL_TABLET | Freq: Two times a day (BID) | ORAL | Status: DC
  Administered 2018-08-20 – 2018-08-23 (×6): 25 mg via ORAL
  Filled 2018-08-20 (×6): qty 1

## 2018-08-20 MED ORDER — MIDAZOLAM HCL 2 MG/2ML IJ SOLN: 2.0000 mg | INTRAMUSCULAR | Status: DC | PRN

## 2018-08-20 MED ORDER — FUROSEMIDE 10 MG/ML IJ SOLN
40.0000 mg | Freq: Every day | INTRAMUSCULAR | Status: DC
  Administered 2018-08-20 – 2018-08-21 (×2): 40 mg via INTRAVENOUS
  Filled 2018-08-20 (×2): qty 4

## 2018-08-20 MED ORDER — KETOROLAC TROMETHAMINE 15 MG/ML IJ SOLN
15.0000 mg | Freq: Three times a day (TID) | INTRAMUSCULAR | Status: AC
  Administered 2018-08-20 – 2018-08-21 (×2): 15 mg via INTRAVENOUS
  Filled 2018-08-20 (×2): qty 1

## 2018-08-20 MED ORDER — HYDROMORPHONE HCL 2 MG PO TABS
1.0000 mg | ORAL_TABLET | ORAL | Status: DC | PRN
  Administered 2018-08-20: 1 mg via ORAL
  Filled 2018-08-20: qty 1

## 2018-08-20 NOTE — Progress Notes (Signed)
CT surgery p.m. Rounds  Up in chair and ambulated in hallway Maintaining sinus rhythm Pain under adequate control P.m. labs satisfactory Continue current care

## 2018-08-20 NOTE — Progress Notes (Signed)
Progress Note  Patient Name: Alex Bryant Date of Encounter: 08/20/2018  Primary Cardiologist: Quay Burow, MD   Subjective   Postop day #1 CABG x5 by Dr. Dahlia Byes with a LIMA to the LAD, SVG to OM 1 and 2 sequentially, ramus branch and PDA.  Extubated successfully.  Alert awake and communicative.  Off drips.  In the process of getting lines removed.  Inpatient Medications    Scheduled Meds: . acetaminophen  1,000 mg Oral Q6H   Or  . acetaminophen (TYLENOL) oral liquid 160 mg/5 mL  1,000 mg Per Tube Q6H  . aspirin EC  81 mg Oral Daily  . bisacodyl  10 mg Oral Daily   Or  . bisacodyl  10 mg Rectal Daily  . [START ON 08/21/2018] clopidogrel  75 mg Oral Daily  . docusate sodium  200 mg Oral Daily  . enoxaparin (LOVENOX) injection  40 mg Subcutaneous QHS  . furosemide  40 mg Intravenous Daily  . insulin aspart  0-24 Units Subcutaneous Q4H  . insulin detemir  10 Units Subcutaneous BID  . ketorolac  15 mg Intravenous Q6H  . mouth rinse  15 mL Mouth Rinse BID  . metoprolol tartrate  12.5 mg Oral BID   Or  . metoprolol tartrate  12.5 mg Per Tube BID  . [START ON 08/21/2018] pantoprazole  40 mg Oral Daily  . potassium chloride  20 mEq Oral BID  . ramipril  2.5 mg Oral Daily  . rosuvastatin  40 mg Oral q1800  . sodium chloride flush  3 mL Intravenous Q12H   Continuous Infusions: . sodium chloride Stopped (08/19/18 1731)  . sodium chloride    . sodium chloride 20 mL/hr at 08/19/18 2300  . cefUROXime (ZINACEF)  IV Stopped (08/20/18 0846)  . insulin (NOVOLIN-R) infusion 3 mL/hr at 08/20/18 0900  . lactated ringers    . lactated ringers    . lactated ringers 20 mL/hr at 08/20/18 0900  . nitroGLYCERIN Stopped (08/19/18 2343)  . phenylephrine (NEO-SYNEPHRINE) Adult infusion Stopped (08/19/18 2109)  . vancomycin 1,000 mg (08/20/18 0913)   PRN Meds: sodium chloride, HYDROmorphone, lactated ringers, metoprolol tartrate, midazolam, morphine injection, ondansetron (ZOFRAN) IV,  sodium chloride flush, traMADol   Vital Signs    Vitals:   08/20/18 0600 08/20/18 0810 08/20/18 0815 08/20/18 0900  BP:   113/72   Pulse: 64  74 68  Resp: (!) 23   17  Temp: 98.6 F (37 C) 98.8 F (37.1 C)  98.6 F (37 C)  TempSrc:      SpO2: 98%   95%  Weight:      Height:        Intake/Output Summary (Last 24 hours) at 08/20/2018 0922 Last data filed at 08/20/2018 0900 Gross per 24 hour  Intake 8481.92 ml  Output 4792 ml  Net 3689.92 ml   Filed Weights   08/14/18 0747 08/19/18 1400 08/20/18 0500  Weight: 93.4 kg 95 kg 98.3 kg    Telemetry    Sinus rhythm with intermittent atrial pacing- Personally Reviewed  ECG    Normal sinus rhythm at 66 without ST or T wave changes.- Personally Reviewed  Physical Exam   GEN: No acute distress.   Neck: No JVD Cardiac: RRR, no murmurs, rubs, or gallops.  Respiratory: Clear to auscultation bilaterally. GI: Soft, nontender, non-distended  MS: No edema; No deformity. Neuro:  Nonfocal  Psych: Normal affect   Labs    Chemistry Recent Labs  Lab 08/15/18 [phone]  08/19/18 0505  08/19/18 2013 08/19/18 2015 08/20/18 0403  NA 140   < > 138   < > 140 139 139  K 4.3   < > 4.7   < > 4.3 4.3 4.1  CL 106   < > 103   < > 111 107 111  CO2 25   < > 27  --  22  --  22  GLUCOSE 138*   < > 172*   < > 110* 111* 108*  BUN 19   < > 20   < > 15 15 15   CREATININE 1.16   < > 1.20   < > 1.00 1.00 1.01  CALCIUM 9.4   < > 9.6  --  7.9*  --  8.0*  PROT 6.4*  --   --   --   --   --   --   ALBUMIN 3.6  --   --   --   --   --   --   AST 13*  --   --   --   --   --   --   ALT 16  --   --   --   --   --   --   ALKPHOS 63  --   --   --   --   --   --   BILITOT 0.5  --   --   --   --   --   --   GFRNONAA >60   < > >60  --  >60  --  >60  GFRAA >60   < > >60  --  >60  --  >60  ANIONGAP 9   < > 8  --  7  --  6   < > = values in this interval not displayed.     Hematology Recent Labs  Lab 08/19/18 1403  08/19/18 2013 08/19/18 2015  08/20/18 0403  WBC 9.4  --  9.2  --  7.7  RBC 3.43*  --  3.32*  --  3.23*  HGB 10.5*   < > 10.3* 9.5* 9.9*  HCT 30.2*   < > 29.2* 28.0* 28.8*  MCV 88.0  --  88.0  --  89.2  MCH 30.6  --  31.0  --  30.7  MCHC 34.8  --  35.3  --  34.4  RDW 12.7  --  12.6  --  12.8  PLT 145*  --  151  --  146*   < > = values in this interval not displayed.    Cardiac EnzymesNo results for input(s): TROPONINI in the last 168 hours. No results for input(s): TROPIPOC in the last 168 hours.   BNPNo results for input(s): BNP, PROBNP in the last 168 hours.   DDimer No results for input(s): DDIMER in the last 168 hours.   Radiology    Dg Chest Port 1 View  Result Date: 08/19/2018 CLINICAL DATA:  CABG. EXAM: PORTABLE CHEST 1 VIEW COMPARISON:  08/15/2018. FINDINGS: NG tube noted with its tip in the upper most portion of the stomach. Advancement of approximately 10 cm should be considered endotracheal tube, mediastinal drainage catheter, left chest noted good anatomic position. No pneumothorax. Prior CABG. Bibasilar atelectasis/infiltrates. IMPRESSION: 1. NG tube noted with its tip in the upper most portion of the stomach. Advancement of approximately 10 cm suggested. 2. Remaining lines and tubes including left chest tube as above. No pneumothorax. 3.  Prior CABG.  Cardiomegaly.  No pulmonary venous congestion. 4.  Mild bibasilar atelectasis/infiltrates. Electronically Signed   By: Marcello Moores  Register   On: 08/19/2018 14:43    Cardiac Studies   2D echocardiogram (08/14/2018)  Study Conclusions  - Left ventricle: The cavity size was normal. There was moderate   concentric hypertrophy. Systolic function was normal. The   estimated ejection fraction was in the range of 60% to 65%. Wall   motion was normal; there were no regional wall motion   abnormalities. Doppler parameters are consistent with abnormal   left ventricular relaxation (grade 1 diastolic dysfunction).   There was no evidence of elevated ventricular  filling pressure by   Doppler parameters. - Aortic valve: There was no regurgitation. - Mitral valve: Calcified annulus. - Right ventricle: The cavity size was normal. Wall thickness was   normal. Systolic function was normal. - Right atrium: The atrium was normal in size. - Tricuspid valve: There was no regurgitation. - Inferior vena cava: The vessel was normal in size. - Pericardium, extracardiac: There was no pericardial effusion.  Cardiac catheterization (8/29//19)   Prox Cx lesion is 75% stenosed.  Mid Cx to Dist Cx lesion is 90% stenosed.  Ost 2nd Mrg lesion is 95% stenosed.  Ost 2nd Mrg to 2nd Mrg lesion is 70% stenosed.  Ost LAD to Prox LAD lesion is 95% stenosed.  Prox LAD lesion is 95% stenosed.  Ost Ramus to Ramus lesion is 90% stenosed.  Ramus lesion is 90% stenosed.  Prox RCA to Mid RCA lesion is 90% stenosed.  Dist RCA lesion is 95% stenosed.  The left ventricular systolic function is normal.  LV end diastolic pressure is normal.  The left ventricular ejection fraction is 55-65% by visual estimate.   Patient Profile     67 y.o. male married with 3 children history of hypertension, hyperlipidemia and diabetes is lost prior TIA who underwent diagnostic coronary angiography via the right radial approach on 08/14/2018 revealing severe three-vessel disease and preserved LV function.  He underwent CABG x5 yesterday by Dr. Dahlia Byes without complication.  His EF was normal.  He was extubated without incident has remained hemodynamic with stable with good oxygenation.  Vital signs are stable.  Assessment & Plan    1: Coronary artery disease- cardiac catheterization revealing three-vessel disease postop day 1 CABG doing well.  2: Essential hypertension- on ramipril and metoprolol  3: Hyperlipidemia- on high-dose statin therapy  Overall doing as expected.  He is clinically stable.  Normal progression per TCT S.  Out of bed once Swan, pacer and chest tubes  are removed.  Normal ambulation.  We will continue to follow.      For questions or updates, please contact [place] Please consult [email or web address] for contact info under Cardiology/STEMI.      Signed, Quay Burow, MD  08/20/2018, 9:22 AM

## 2018-08-20 NOTE — Progress Notes (Signed)
Discontinued mediastinal chest tube and converted to single pleural chest tube per Dr. Ron Agee

## 2018-08-20 NOTE — Anesthesia Postprocedure Evaluation (Signed)
Anesthesia Post Note  Patient: Alex Bryant  Procedure(s) Performed: CORONARY ARTERY BYPASS GRAFTING (CABG) x 5  USING LEFT INTERNAL MAMMARY ARTERY AND ENDOSCOPICALLY HARVESTED RIGHT SAPHENOUS VEIN. (N/A Chest) TRANSESOPHAGEAL ECHOCARDIOGRAM (TEE) (N/A )     Patient location during evaluation: SICU Anesthesia Type: General Level of consciousness: awake, oriented and awake and alert Pain management: pain level controlled Vital Signs Assessment: post-procedure vital signs reviewed and stable Respiratory status: spontaneous breathing, respiratory function stable and nonlabored ventilation Cardiovascular status: blood pressure returned to baseline Anesthetic complications: no    Last Vitals:  Vitals:   08/20/18 0810 08/20/18 0815  BP:  113/72  Pulse:  74  Resp:    Temp: 37.1 C   SpO2:      Last Pain:  Vitals:   08/20/18 0735  TempSrc:   PainSc: 0-No pain                 Braydon Kullman COKER

## 2018-08-20 NOTE — Progress Notes (Signed)
1 Day Post-Op Procedure(s) (LRB): CORONARY ARTERY BYPASS GRAFTING (CABG) x 5  USING LEFT INTERNAL MAMMARY ARTERY AND ENDOSCOPICALLY HARVESTED RIGHT SAPHENOUS VEIN. (N/A) TRANSESOPHAGEAL ECHOCARDIOGRAM (TEE) (N/A) Subjective: Stable postop CABG x5 BP up  NSR  Objective: Vital signs in last 24 hours: Temp:  [95.7 F (35.4 C)-99.3 F (37.4 C)] 98.6 F (37 C) (09/04 0600) Pulse Rate:  [64-89] 64 (09/04 0600) Cardiac Rhythm: Heart block (09/04 0400) Resp:  [0-26] 23 (09/04 0600) BP: (106)/(76) 106/76 (09/03 1545) SpO2:  [93 %-100 %] 98 % (09/04 0600) Arterial Line BP: (98-172)/(44-78) 172/56 (09/04 0600) FiO2 (%):  [40 %-50 %] 40 % (09/03 1650) Weight:  [95 kg-98.3 kg] 98.3 kg (09/04 0500)  Hemodynamic parameters for last 24 hours: PAP: (15-35)/(1-17) 35/11 CO:  [3.6 L/min-6.7 L/min] 5.5 L/min CI:  [1.7 L/min/m2-3.1 L/min/m2] 2.6 L/min/m2  Intake/Output from previous day: 09/03 0701 - 09/04 0700 In: 7827.4 [P.O.:240; I.V.:2849; Blood:395; IV Piggyback:4343.4] Out: 4322 [Urine:3105; Blood:660; Chest Tube:557] Intake/Output this shift: No intake/output data recorded.       Exam    General- alert and comfortable    Neck- no JVD, no cervical adenopathy palpable, no carotid bruit   Lungs- clear without rales, wheezes   Cor- regular rate and rhythm, no murmur , gallop   Abdomen- soft, non-tender   Extremities - warm, non-tender, minimal edema   Neuro- oriented, appropriate, no focal weakness   Lab Results: Recent Labs    08/19/18 2013 08/19/18 2015 08/20/18 0403  WBC 9.2  --  7.7  HGB 10.3* 9.5* 9.9*  HCT 29.2* 28.0* 28.8*  PLT 151  --  146*   BMET:  Recent Labs    08/19/18 2013 08/19/18 2015 08/20/18 0403  NA 140 139 139  K 4.3 4.3 4.1  CL 111 107 111  CO2 22  --  22  GLUCOSE 110* 111* 108*  BUN 15 15 15   CREATININE 1.00 1.00 1.01  CALCIUM 7.9*  --  8.0*    PT/INR:  Recent Labs    08/19/18 1403  LABPROT 17.4*  INR 1.44   ABG    Component Value  Date/Time   PHART 7.353 08/19/2018 1831   HCO3 23.7 08/19/2018 1831   TCO2 21 (L) 08/19/2018 2015   ACIDBASEDEF 2.0 08/19/2018 1831   O2SAT 96.0 08/19/2018 1831   CBG (last 3)  Recent Labs    08/20/18 0405 08/20/18 0506 08/20/18 0609  GLUCAP 106* 106* 111*    Assessment/Plan: S/P Procedure(s) (LRB): CORONARY ARTERY BYPASS GRAFTING (CABG) x 5  USING LEFT INTERNAL MAMMARY ARTERY AND ENDOSCOPICALLY HARVESTED RIGHT SAPHENOUS VEIN. (N/A) TRANSESOPHAGEAL ECHOCARDIOGRAM (TEE) (N/A) Mobilize Diuresis Diabetes control d/c tubes/lines See progression orders resume preop plavix tomorrow for remote CVA   LOS: 6 days    Alex Bryant 08/20/2018

## 2018-08-20 NOTE — Progress Notes (Signed)
Anesthesiology Follow-up:  Awake and alert, neuro intact, minimal pain, in good spirits.  VS: T- 37.0 BP- 136/46 HR- 66 (SR) RR- 21 O2 Sat 98% PAP- 36/9 CO/CI- 5.5/2.6  K-4.1 Na-139 BUN/Cr.- 15/1.01 glucose- 140 H/H- 9.9/28.8 Platelets- 146,000  Extubated 3 hours post-op.  67 year old male one day S/P CABG X 5 for unstable angina with diffuse CAD and preserved LV function. Stable post-op course. No apparent complications.  Alex Bryant

## 2018-08-21 ENCOUNTER — Inpatient Hospital Stay (HOSPITAL_COMMUNITY): Payer: PPO

## 2018-08-21 LAB — GLUCOSE, CAPILLARY
GLUCOSE-CAPILLARY: 133 mg/dL — AB (ref 70–99)
Glucose-Capillary: 113 mg/dL — ABNORMAL HIGH (ref 70–99)
Glucose-Capillary: 177 mg/dL — ABNORMAL HIGH (ref 70–99)
Glucose-Capillary: 185 mg/dL — ABNORMAL HIGH (ref 70–99)
Glucose-Capillary: 145 mg/dL — ABNORMAL HIGH (ref 70–99)

## 2018-08-21 LAB — BASIC METABOLIC PANEL
Anion gap: 6 (ref 5–15)
BUN: 16 mg/dL (ref 8–23)
CO2: 24 mmol/L (ref 22–32)
Calcium: 8.2 mg/dL — ABNORMAL LOW (ref 8.9–10.3)
Chloride: 107 mmol/L (ref 98–111)
Creatinine, Ser: 1.05 mg/dL (ref 0.61–1.24)
GFR calc Af Amer: >60 mL/min (ref >60)
GFR calc non Af Amer: >60 mL/min (ref >60)
Glucose, Bld: 123 mg/dL — ABNORMAL HIGH (ref 70–99)
Potassium: 4.7 mmol/L (ref 3.5–5.1)
Sodium: 137 mmol/L (ref 135–145)

## 2018-08-21 LAB — CBC
HCT: 28 % — ABNORMAL LOW (ref 39.0–52.0)
Hemoglobin: 9.2 g/dL — ABNORMAL LOW (ref 13.0–17.0)
MCH: 30.4 pg (ref 26.0–34.0)
MCHC: 32.9 g/dL (ref 30.0–36.0)
MCV: 92.4 fL (ref 78.0–100.0)
Platelets: 130 10*3/uL — ABNORMAL LOW (ref 150–400)
RBC: 3.03 MIL/uL — ABNORMAL LOW (ref 4.22–5.81)
RDW: 13.2 % (ref 11.5–15.5)
WBC: 8.2 10*3/uL (ref 4.0–10.5)

## 2018-08-21 MED ORDER — OXYCODONE HCL 5 MG PO TABS
10.0000 mg | ORAL_TABLET | ORAL | Status: DC | PRN
  Administered 2018-08-21: 10 mg via ORAL
  Filled 2018-08-21: qty 2

## 2018-08-21 MED ORDER — MIDAZOLAM HCL 2 MG/2ML IJ SOLN: 1.0000 mg | INTRAMUSCULAR | Status: DC | PRN

## 2018-08-21 MED ORDER — RAMIPRIL 2.5 MG PO CAPS
2.5000 mg | ORAL_CAPSULE | Freq: Every day | ORAL | Status: DC
  Administered 2018-08-21 – 2018-08-23 (×3): 2.5 mg via ORAL
  Filled 2018-08-21 (×3): qty 1

## 2018-08-21 MED ORDER — TEMAZEPAM 7.5 MG PO CAPS
30.0000 mg | ORAL_CAPSULE | Freq: Every evening | ORAL | Status: DC | PRN
  Administered 2018-08-21 – 2018-08-22 (×2): 30 mg via ORAL
  Filled 2018-08-21: qty 2
  Filled 2018-08-21: qty 4

## 2018-08-21 MED ORDER — SODIUM CHLORIDE 0.9% FLUSH: 3.0000 mL | INTRAVENOUS | Status: DC | PRN

## 2018-08-21 MED ORDER — ALUM & MAG HYDROXIDE-SIMETH 200-200-20 MG/5ML PO SUSP: 15.0000 mL | ORAL | Status: DC | PRN

## 2018-08-21 MED ORDER — SORBITOL 70 % PO SOLN
60.0000 mL | Freq: Every morning | ORAL | Status: DC
  Administered 2018-08-21: 60 mL via ORAL
  Filled 2018-08-21 (×2): qty 60

## 2018-08-21 MED ORDER — SODIUM CHLORIDE 0.9 % IV SOLN
250.0000 mL | INTRAVENOUS | Status: DC | PRN
  Administered 2018-08-21: 250 mL via INTRAVENOUS

## 2018-08-21 MED ORDER — MOVING RIGHT ALONG BOOK
Freq: Once | Status: AC
  Administered 2018-08-21: 09:00:00
  Filled 2018-08-21: qty 1

## 2018-08-21 MED ORDER — BLISTEX MEDICATED EX OINT
TOPICAL_OINTMENT | CUTANEOUS | Status: DC | PRN
  Filled 2018-08-21: qty 6.3

## 2018-08-21 MED ORDER — CHLORHEXIDINE GLUCONATE CLOTH 2 % EX PADS
6.0000 | MEDICATED_PAD | Freq: Every day | CUTANEOUS | Status: DC
  Administered 2018-08-21: 6 via TOPICAL

## 2018-08-21 MED ORDER — INSULIN ASPART 100 UNIT/ML ~~LOC~~ SOLN
0.0000 [IU] | Freq: Three times a day (TID) | SUBCUTANEOUS | Status: DC
  Administered 2018-08-21: 4 [IU] via SUBCUTANEOUS
  Administered 2018-08-21: 2 [IU] via SUBCUTANEOUS
  Administered 2018-08-21 – 2018-08-22 (×3): 4 [IU] via SUBCUTANEOUS
  Administered 2018-08-22: 2 [IU] via SUBCUTANEOUS
  Administered 2018-08-23: 4 [IU] via SUBCUTANEOUS
  Administered 2018-08-23: 2 [IU] via SUBCUTANEOUS

## 2018-08-21 MED ORDER — MAGNESIUM HYDROXIDE 400 MG/5ML PO SUSP: 30.0000 mL | Freq: Every day | ORAL | Status: DC | PRN

## 2018-08-21 MED ORDER — SODIUM CHLORIDE 0.9% FLUSH
3.0000 mL | Freq: Two times a day (BID) | INTRAVENOUS | Status: DC
  Administered 2018-08-21 – 2018-08-22 (×3): 3 mL via INTRAVENOUS

## 2018-08-21 MED ORDER — INSULIN DETEMIR 100 UNIT/ML ~~LOC~~ SOLN
10.0000 [IU] | Freq: Every day | SUBCUTANEOUS | Status: DC
  Filled 2018-08-21: qty 0.1

## 2018-08-21 MED ORDER — SODIUM CHLORIDE 0.9% FLUSH: 10.0000 mL | INTRAVENOUS | Status: DC | PRN

## 2018-08-21 MED ORDER — FE FUMARATE-B12-VIT C-FA-IFC PO CAPS
1.0000 | ORAL_CAPSULE | Freq: Two times a day (BID) | ORAL | Status: DC
  Administered 2018-08-21 – 2018-08-23 (×5): 1 via ORAL
  Filled 2018-08-21 (×5): qty 1

## 2018-08-21 NOTE — Progress Notes (Signed)
2 Days Post-Op Procedure(s) (LRB): CORONARY ARTERY BYPASS GRAFTING (CABG) x 5  USING LEFT INTERNAL MAMMARY ARTERY AND ENDOSCOPICALLY HARVESTED RIGHT SAPHENOUS VEIN. (N/A) TRANSESOPHAGEAL ECHOCARDIOGRAM (TEE) (N/A) Subjective: Continues to do well with decreased pain Ambulated in hallway Sinus rhythm 2 L nasal cannula sat 95% Ready for transfer to stepdown  Objective: Vital signs in last 24 hours: Temp:  [97.8 F (36.6 C)-99 F (37.2 C)] 98.2 F (36.8 C) (09/05 0743) Pulse Rate:  [64-80] 72 (09/05 0743) Cardiac Rhythm: Normal sinus rhythm (09/05 0400) Resp:  [10-26] 15 (09/05 0743) BP: (103-155)/(55-103) 113/66 (09/05 0743) SpO2:  [88 %-96 %] 93 % (09/05 0743) Arterial Line BP: (154-173)/(59-66) 154/59 (09/04 1000) Weight:  [97.6 kg] 97.6 kg (09/05 0600)  Hemodynamic parameters for last 24 hours: PAP: (28-37)/(8-11) 28/8  Intake/Output from previous day: 09/04 0701 - 09/05 0700 In: 1144.9 [P.O.:720; I.V.:124.9; IV Piggyback:300.1] Out: 2225 [Urine:1925; Chest Tube:300] Intake/Output this shift: No intake/output data recorded.       Exam    General- alert and comfortable    Neck- no JVD, no cervical adenopathy palpable, no carotid bruit   Lungs- clear without rales, wheezes   Cor- regular rate and rhythm, no murmur , gallop   Abdomen- soft, non-tender   Extremities - warm, non-tender, minimal edema   Neuro- oriented, appropriate, no focal weakness   Lab Results: Recent Labs    08/20/18 1619 08/20/18 1624 08/21/18 0500  WBC 8.8  --  8.2  HGB 10.6* 9.9* 9.2*  HCT 31.7* 29.0* 28.0*  PLT 144*  --  130*   BMET:  Recent Labs    08/20/18 0403  08/20/18 1624 08/21/18 0500  NA 139  --  136 137  K 4.1  --  4.3 4.7  CL 111  --  102 107  CO2 22  --   --  24  GLUCOSE 108*  --  167* 123*  BUN 15  --  14 16  CREATININE 1.01   < > 1.10 1.05  CALCIUM 8.0*  --   --  8.2*   < > = values in this interval not displayed.    PT/INR:  Recent Labs    08/19/18 1403   LABPROT 17.4*  INR 1.44   ABG    Component Value Date/Time   PHART 7.353 08/19/2018 1831   HCO3 23.7 08/19/2018 1831   TCO2 23 08/20/2018 1624   ACIDBASEDEF 2.0 08/19/2018 1831   O2SAT 96.0 08/19/2018 1831   CBG (last 3)  Recent Labs    08/20/18 2343 08/21/18 0403 08/21/18 0747  GLUCAP 158* 113* 133*    Assessment/Plan: S/P Procedure(s) (LRB): CORONARY ARTERY BYPASS GRAFTING (CABG) x 5  USING LEFT INTERNAL MAMMARY ARTERY AND ENDOSCOPICALLY HARVESTED RIGHT SAPHENOUS VEIN. (N/A) TRANSESOPHAGEAL ECHOCARDIOGRAM (TEE) (N/A) Mobilize Diuresis Diabetes control Plan for transfer to step-down: see transfer orders Continue Plavix-taking preop for previous CVA   LOS: 7 days    Alex Bryant 08/21/2018

## 2018-08-21 NOTE — Progress Notes (Signed)
Progress Note  Patient Name: Alex Bryant Date of Encounter: 08/21/2018  Primary Cardiologist: Quay Burow, MD   Subjective   Postop day #2 CABG x5 by Dr. Dahlia Byes with a LIMA to the LAD, SVG to OM 1 and 2 sequentially, ramus branch and PDA.. Off all drips.  One chest tube remains but is going to be discontinued this morning.  He is up in a chair and has walked around the room without symptoms.  Inpatient Medications    Scheduled Meds: . acetaminophen  1,000 mg Oral Q6H   Or  . acetaminophen (TYLENOL) oral liquid 160 mg/5 mL  1,000 mg Per Tube Q6H  . aspirin EC  81 mg Oral Daily  . bisacodyl  10 mg Oral Daily   Or  . bisacodyl  10 mg Rectal Daily  . clopidogrel  75 mg Oral Daily  . docusate sodium  200 mg Oral Daily  . enoxaparin (LOVENOX) injection  40 mg Subcutaneous QHS  . ferrous [id number] C-folic acid  1 capsule Oral BID PC  . furosemide  40 mg Intravenous Daily  . insulin aspart  0-24 Units Subcutaneous TID AC & HS  . insulin detemir  10 Units Subcutaneous QHS  . metoprolol tartrate  25 mg Oral BID  . pantoprazole  40 mg Oral Daily  . rosuvastatin  40 mg Oral q1800  . sodium chloride flush  3 mL Intravenous Q12H  . sodium chloride flush  3 mL Intravenous Q12H  . sorbitol  60 mL Oral q morning - 10a   Continuous Infusions: . sodium chloride 250 mL (08/21/18 0915)  . cefUROXime (ZINACEF)  IV 1.5 g (08/21/18 0916)  . lactated ringers    . lactated ringers Stopped (08/20/18 1200)   PRN Meds: sodium chloride, alum & mag hydroxide-simeth, lip balm, magnesium hydroxide, menthol-cetylpyridinium, metoprolol tartrate, midazolam, morphine injection, ondansetron (ZOFRAN) IV, oxyCODONE, sodium chloride flush, sodium chloride flush, temazepam, traMADol   Vital Signs    Vitals:   08/21/18 0500 08/21/18 0600 08/21/18 0700 08/21/18 0743  BP: (!) 103/55  113/66 113/66  Pulse: 64 66 69 72  Resp: 11 (!) 22 16 15   Temp:    98.2 F (36.8 C)  TempSrc:     Oral  SpO2: 96% 92% (!) 89% 93%  Weight:  97.6 kg    Height:        Intake/Output Summary (Last 24 hours) at 08/21/2018 0926 Last data filed at 08/21/2018 0900 Gross per 24 hour  Intake 515.9 ml  Output 1870 ml  Net -1354.1 ml   Filed Weights   08/19/18 1400 08/20/18 0500 08/21/18 0600  Weight: 95 kg 98.3 kg 97.6 kg    Telemetry    Sinus rhythm with intermittent atrial pacing- Personally Reviewed  ECG    Not performed today.- Personally Reviewed  Physical Exam   GEN: No acute distress.   Neck: No JVD Cardiac: RRR, no murmurs, rubs, or gallops.  Respiratory: Clear to auscultation bilaterally. GI: Soft, nontender, non-distended  MS: No edema; No deformity. Neuro:  Nonfocal  Psych: Normal affect   Labs    Chemistry Recent Labs  Lab 08/15/18 0259  08/19/18 2013  08/20/18 0403 08/20/18 1619 08/20/18 1624 08/21/18 0500  NA 140   < > 140   < > 139  --  136 137  K 4.3   < > 4.3   < > 4.1  --  4.3 4.7  CL 106   < > 111   < >  111  --  102 107  CO2 25   < > 22  --  22  --   --  24  GLUCOSE 138*   < > 110*   < > 108*  --  167* 123*  BUN 19   < > 15   < > 15  --  14 16  CREATININE 1.16   < > 1.00   < > 1.01 1.12 1.10 1.05  CALCIUM 9.4   < > 7.9*  --  8.0*  --   --  8.2*  PROT 6.4*  --   --   --   --   --   --   --   ALBUMIN 3.6  --   --   --   --   --   --   --   AST 13*  --   --   --   --   --   --   --   ALT 16  --   --   --   --   --   --   --   ALKPHOS 63  --   --   --   --   --   --   --   BILITOT 0.5  --   --   --   --   --   --   --   GFRNONAA >60   < > >60  --  >60 >60  --  >60  GFRAA >60   < > >60  --  >60 >60  --  >60  ANIONGAP 9   < > 7  --  6  --   --  6   < > = values in this interval not displayed.     Hematology Recent Labs  Lab 08/20/18 0403 08/20/18 1619 08/20/18 1624 08/21/18 0500  WBC 7.7 8.8  --  8.2  RBC 3.23* 3.49*  --  3.03*  HGB 9.9* 10.6* 9.9* 9.2*  HCT 28.8* 31.7* 29.0* 28.0*  MCV 89.2 90.8  --  92.4  MCH 30.7 30.4  --  30.4    MCHC 34.4 33.4  --  32.9  RDW 12.8 13.1  --  13.2  PLT 146* 144*  --  130*    Cardiac EnzymesNo results for input(s): TROPONINI in the last 168 hours. No results for input(s): TROPIPOC in the last 168 hours.   BNPNo results for input(s): BNP, PROBNP in the last 168 hours.   DDimer No results for input(s): DDIMER in the last 168 hours.   Radiology    Dg Chest Port 1 View  Result Date: 08/20/2018 CLINICAL DATA:  Postoperative day 1 status post CABG EXAM: PORTABLE CHEST 1 VIEW COMPARISON:  08/19/2018 FINDINGS: Endotracheal and nasogastric tubes have been removed. Swan-Ganz catheter in the right descending pulmonary artery. Left chest tube remains in place. No pneumothorax. Loop recorder unchanged in position. Epicardial pacer leads faintly seen. Mild increase in atelectasis in the right lower lobe. Bandlike atelectasis in the left mid lung is stable. Low lung volumes. Mild enlargement of the cardiopericardial silhouette, without edema. IMPRESSION: 1. Mildly increased right lower lobe atelectasis. Stable atelectasis in the left mid lung. 2. Endotracheal and nasogastric tubes have been removed. 3. Stable mild cardiomegaly. Electronically Signed   By: Van Clines M.D.   On: 08/20/2018 10:35   Dg Chest Port 1 View  Result Date: 08/19/2018 CLINICAL DATA:  CABG. EXAM: PORTABLE CHEST 1 VIEW COMPARISON:  08/15/2018.  FINDINGS: NG tube noted with its tip in the upper most portion of the stomach. Advancement of approximately 10 cm should be considered endotracheal tube, mediastinal drainage catheter, left chest noted good anatomic position. No pneumothorax. Prior CABG. Bibasilar atelectasis/infiltrates. IMPRESSION: 1. NG tube noted with its tip in the upper most portion of the stomach. Advancement of approximately 10 cm suggested. 2. Remaining lines and tubes including left chest tube as above. No pneumothorax. 3.  Prior CABG.  Cardiomegaly.  No pulmonary venous congestion. 4.  Mild bibasilar  atelectasis/infiltrates. Electronically Signed   By: Marcello Moores  Register   On: 08/19/2018 14:43    Cardiac Studies   2D echocardiogram (08/14/2018)  Study Conclusions  - Left ventricle: The cavity size was normal. There was moderate   concentric hypertrophy. Systolic function was normal. The   estimated ejection fraction was in the range of 60% to 65%. Wall   motion was normal; there were no regional wall motion   abnormalities. Doppler parameters are consistent with abnormal   left ventricular relaxation (grade 1 diastolic dysfunction).   There was no evidence of elevated ventricular filling pressure by   Doppler parameters. - Aortic valve: There was no regurgitation. - Mitral valve: Calcified annulus. - Right ventricle: The cavity size was normal. Wall thickness was   normal. Systolic function was normal. - Right atrium: The atrium was normal in size. - Tricuspid valve: There was no regurgitation. - Inferior vena cava: The vessel was normal in size. - Pericardium, extracardiac: There was no pericardial effusion.  Cardiac catheterization (8/29//19)   Prox Cx lesion is 75% stenosed.  Mid Cx to Dist Cx lesion is 90% stenosed.  Ost 2nd Mrg lesion is 95% stenosed.  Ost 2nd Mrg to 2nd Mrg lesion is 70% stenosed.  Ost LAD to Prox LAD lesion is 95% stenosed.  Prox LAD lesion is 95% stenosed.  Ost Ramus to Ramus lesion is 90% stenosed.  Ramus lesion is 90% stenosed.  Prox RCA to Mid RCA lesion is 90% stenosed.  Dist RCA lesion is 95% stenosed.  The left ventricular systolic function is normal.  LV end diastolic pressure is normal.  The left ventricular ejection fraction is 55-65% by visual estimate.   Patient Profile     67 y.o. male married with 3 children history of hypertension, hyperlipidemia and diabetes is lost prior TIA who underwent diagnostic coronary angiography via the right radial approach on 08/14/2018 revealing severe three-vessel disease and preserved LV  function.  He underwent CABG x5 on 08/20/2018 by Dr. Dahlia Byes without complication.  His EF was normal.  He was extubated without incident has remained hemodynamic with stable with good oxygenation.  Vital signs are stable.  Assessment & Plan    1: Coronary artery disease- cardiac catheterization revealing three-vessel disease postop day #2 CABG x5 doing well.  2: Essential hypertension- on ramipril and metoprolol  3: Hyperlipidemia- on high-dose statin therapy  Overall doing better than expected.  He is clinically stable.  Normal progression per TCT S.  One chest tube remains which will be discontinued.  The patient is up and ambulatory.  We will see again on the last day of his hospitalization to arrange follow-up with me in the office in 2 to 3 weeks.  Appreciate Dr. Darcey Nora and his team's excellent care!!!     For questions or updates, please contact [place] Please consult [email or web address] for contact info under Cardiology/STEMI.      Signed, Quay Burow, MD  08/21/2018, 9:26 AM

## 2018-08-21 NOTE — Progress Notes (Signed)
      [address]       [place] [address]             [phone]      POD # 2 CABG  No complaints  BP (!) 151/78   Pulse 77   Temp 98.2 F (36.8 C) (Oral)   Resp (!) 26   Ht 6' (1.829 m)   Wt 97.6 kg   SpO2 93%   BMI 29.18 kg/m   Intake/Output Summary (Last 24 hours) at 08/21/2018 1812 Last data filed at 08/21/2018 1700 Gross per 24 hour  Intake 1163.2 ml  Output 1650 ml  Net -486.8 ml   Awaiting bed on step down  Remo Lipps C. Roxan Hockey, MD [facility] [phone]

## 2018-08-21 NOTE — Progress Notes (Signed)
Spoke to [employer] re D/C CVC order.  RN aware.

## 2018-08-22 ENCOUNTER — Inpatient Hospital Stay (HOSPITAL_COMMUNITY): Payer: PPO

## 2018-08-22 LAB — BPAM RBC
Blood Product Expiration Date: 201909172359
Blood Product Expiration Date: 201909182359
ISSUE DATE / TIME: 201908291313
Unit Type and Rh: 6200
Unit Type and Rh: 6200

## 2018-08-22 LAB — BASIC METABOLIC PANEL
Anion gap: 10 (ref 5–15)
BUN: 14 mg/dL (ref 8–23)
CO2: 24 mmol/L (ref 22–32)
Calcium: 8.5 mg/dL — ABNORMAL LOW (ref 8.9–10.3)
Chloride: 103 mmol/L (ref 98–111)
Creatinine, Ser: 1.11 mg/dL (ref 0.61–1.24)
GFR calc Af Amer: >60 mL/min (ref >60)
GFR calc non Af Amer: >60 mL/min (ref >60)
Glucose, Bld: 159 mg/dL — ABNORMAL HIGH (ref 70–99)
Potassium: 4.2 mmol/L (ref 3.5–5.1)
Sodium: 137 mmol/L (ref 135–145)

## 2018-08-22 LAB — GLUCOSE, CAPILLARY
GLUCOSE-CAPILLARY: 161 mg/dL — AB (ref 70–99)
GLUCOSE-CAPILLARY: 120 mg/dL — AB (ref 70–99)
GLUCOSE-CAPILLARY: 143 mg/dL — AB (ref 70–99)
Glucose-Capillary: 160 mg/dL — ABNORMAL HIGH (ref 70–99)

## 2018-08-22 LAB — TYPE AND SCREEN
ABO/RH(D): A POS
Antibody Screen: NEGATIVE
Unit division: 0
Unit division: 0

## 2018-08-22 LAB — CBC
HCT: 29.7 % — ABNORMAL LOW (ref 39.0–52.0)
Hemoglobin: 10.4 g/dL — ABNORMAL LOW (ref 13.0–17.0)
MCH: 31 pg (ref 26.0–34.0)
MCHC: 35 g/dL (ref 30.0–36.0)
MCV: 88.7 fL (ref 78.0–100.0)
Platelets: 145 10*3/uL — ABNORMAL LOW (ref 150–400)
RBC: 3.35 MIL/uL — ABNORMAL LOW (ref 4.22–5.81)
RDW: 12.7 % (ref 11.5–15.5)
WBC: 8.2 10*3/uL (ref 4.0–10.5)

## 2018-08-22 LAB — TSH: TSH: 3.433 u[IU]/mL (ref 0.350–4.500)

## 2018-08-22 MED ORDER — ENOXAPARIN SODIUM 40 MG/0.4ML ~~LOC~~ SOLN: 40.0000 mg | SUBCUTANEOUS | Status: DC

## 2018-08-22 MED ORDER — FUROSEMIDE 40 MG PO TABS
40.0000 mg | ORAL_TABLET | Freq: Every day | ORAL | Status: DC
  Administered 2018-08-23: 40 mg via ORAL
  Filled 2018-08-22: qty 1

## 2018-08-22 MED ORDER — KETOROLAC TROMETHAMINE 15 MG/ML IJ SOLN
15.0000 mg | Freq: Four times a day (QID) | INTRAMUSCULAR | Status: AC
  Administered 2018-08-22 (×2): 15 mg via INTRAVENOUS
  Filled 2018-08-22 (×2): qty 1

## 2018-08-22 MED ORDER — METFORMIN HCL 500 MG PO TABS: 1000.0000 mg | ORAL_TABLET | Freq: Two times a day (BID) | ORAL | Status: DC

## 2018-08-22 MED ORDER — AMIODARONE HCL IN DEXTROSE 360-4.14 MG/200ML-% IV SOLN
30.0000 mg/h | INTRAVENOUS | Status: DC
  Administered 2018-08-22 – 2018-08-23 (×2): 30 mg/h via INTRAVENOUS
  Filled 2018-08-22: qty 200

## 2018-08-22 MED ORDER — FUROSEMIDE 10 MG/ML IJ SOLN
40.0000 mg | Freq: Every day | INTRAMUSCULAR | Status: AC
  Administered 2018-08-22: 40 mg via INTRAVENOUS
  Filled 2018-08-22: qty 4

## 2018-08-22 MED ORDER — AMIODARONE HCL IN DEXTROSE 360-4.14 MG/200ML-% IV SOLN
60.0000 mg/h | INTRAVENOUS | Status: AC
  Administered 2018-08-22 (×2): 60 mg/h via INTRAVENOUS
  Filled 2018-08-22 (×2): qty 200

## 2018-08-22 MED ORDER — METFORMIN HCL 500 MG PO TABS
500.0000 mg | ORAL_TABLET | Freq: Two times a day (BID) | ORAL | Status: DC
  Administered 2018-08-22 – 2018-08-23 (×3): 500 mg via ORAL
  Filled 2018-08-22 (×3): qty 1

## 2018-08-22 MED FILL — Sodium Chloride IV Soln 0.9%: INTRAVENOUS | Qty: 2000 | Status: AC

## 2018-08-22 MED FILL — Lidocaine HCl(Cardiac) IV PF Soln Pref Syr 100 MG/5ML (2%): INTRAVENOUS | Qty: 5 | Status: AC

## 2018-08-22 MED FILL — Dexmedetomidine HCl in NaCl 0.9% IV Soln 400 MCG/100ML: INTRAVENOUS | Qty: 100 | Status: AC

## 2018-08-22 MED FILL — Mannitol IV Soln 20%: INTRAVENOUS | Qty: 500 | Status: AC

## 2018-08-22 MED FILL — Heparin Sodium (Porcine) Inj 1000 Unit/ML: INTRAMUSCULAR | Qty: 30 | Status: AC

## 2018-08-22 MED FILL — Electrolyte-R (PH 7.4) Solution: INTRAVENOUS | Qty: 5000 | Status: AC

## 2018-08-22 MED FILL — Magnesium Sulfate Inj 50%: INTRAMUSCULAR | Qty: 10 | Status: AC

## 2018-08-22 MED FILL — Sodium Bicarbonate IV Soln 8.4%: INTRAVENOUS | Qty: 50 | Status: AC

## 2018-08-22 MED FILL — Heparin Sodium (Porcine) Inj 1000 Unit/ML: INTRAMUSCULAR | Qty: 20 | Status: AC

## 2018-08-22 MED FILL — Potassium Chloride Inj 2 mEq/ML: INTRAVENOUS | Qty: 40 | Status: AC

## 2018-08-22 NOTE — Discharge Instructions (Signed)
Endoscopic Saphenous Vein Harvesting, Care After °Refer to this sheet in the next few weeks. These instructions provide you with information about caring for yourself after your procedure. Your health care provider may also give you more specific instructions. Your treatment has been planned according to current medical practices, but problems sometimes occur. Call your health care provider if you have any problems or questions after your procedure. °What can I expect after the procedure? °After the procedure, it is common to have: °· Pain. °· Bruising. °· Swelling. °· Numbness. ° °Follow these instructions at home: °Medicine °· Take over-the-counter and prescription medicines only as told by your health care provider. °· Do not drive or operate heavy machinery while taking prescription pain medicine. °Incision care ° °· Follow instructions from your health care provider about how to take care of the cut made during surgery (incision). Make sure you: °? Wash your hands with soap and water before you change your bandage (dressing). If soap and water are not available, use hand sanitizer. °? Change your dressing as told by your health care provider. °? Leave stitches (sutures), skin glue, or adhesive strips in place. These skin closures may need to be in place for 2 weeks or longer. If adhesive strip edges start to loosen and curl up, you may trim the loose edges. Do not remove adhesive strips completely unless your health care provider tells you to do that. °· Check your incision area every day for signs of infection. Check for: °? More redness, swelling, or pain. °? More fluid or blood. °? Warmth. °? Pus or a bad smell. °General instructions °· Raise (elevate) your legs above the level of your heart while you are sitting or lying down. °· Do any exercises your health care providers have given you. These may include deep breathing, coughing, and walking exercises. °· Do not shower, take baths, swim, or use a hot tub  unless told by your health care provider. °· Wear your elastic stocking if told by your health care provider. °· Keep all follow-up visits as told by your health care provider. This is important. °Contact a health care provider if: °· Medicine does not help your pain. °· Your pain gets worse. °· You have new leg bruises or your leg bruises get bigger. °· You have a fever. °· Your leg feels numb. °· You have more redness, swelling, or pain around your incision. °· You have more fluid or blood coming from your incision. °· Your incision feels warm to the touch. °· You have pus or a bad smell coming from your incision. °Get help right away if: °· Your pain is severe. °· You develop pain, tenderness, warmth, redness, or swelling in any part of your leg. °· You have chest pain. °· You have trouble breathing. °This information is not intended to replace advice given to you by your health care provider. Make sure you discuss any questions you have with your health care provider. °Document Released: 08/15/2011 Document Revised: 05/10/2016 Document Reviewed: 10/17/2015 °Elsevier Interactive Patient Education © 2018 [employer] °Coronary Artery Bypass Grafting, Care After °These instructions give you information on caring for yourself after your procedure. Your doctor may also give you more specific instructions. Call your doctor if you have any problems or questions after your procedure. °Follow these instructions at home: °· Only take medicine as told by your doctor. Take medicines exactly as told. Do not stop taking medicines or start any new medicines without talking to your doctor first. °·   Take your pulse as told by your doctor. °· Do deep breathing as told by your doctor. Use your breathing device (incentive spirometer), if given, to practice deep breathing several times a day. Support your chest with a pillow or your arms when you take deep breaths or cough. °· Keep the area clean, dry, and protected where the  surgery cuts (incisions) were made. Remove bandages (dressings) only as told by your doctor. If strips were applied to surgical area, do not take them off. They fall off on their own. °· Check the surgery area daily for puffiness (swelling), redness, or leaking fluid. °· If surgery cuts were made in your legs: °? Avoid crossing your legs. °? Avoid sitting for long periods of time. Change positions every 30 minutes. °? Raise your legs when you are sitting. Place them on pillows. °· Wear stockings that help keep blood clots from forming in your legs (compression stockings). °· Only take sponge baths until your doctor says it is okay to take showers. Pat the surgery area dry. Do not rub the surgery area with a washcloth or towel. Do not bathe, swim, or use a hot tub until your doctor says it is okay. °· Eat foods that are high in fiber. These include raw fruits and vegetables, whole grains, beans, and nuts. Choose lean meats. Avoid canned, processed, and fried foods. °· Drink enough fluids to keep your pee (urine) clear or pale yellow. °· Weigh yourself every day. °· Rest and limit activity as told by your doctor. You may be told to: °? Stop any activity if you have chest pain, shortness of breath, changes in heartbeat, or dizziness. Get help right away if this happens. °? Move around often for short amounts of time or take short walks as told by your doctor. Gradually become more active. You may need help to strengthen your muscles and build endurance. °? Avoid lifting, pushing, or pulling anything heavier than 10 pounds (4.5 kg) for at least 6 weeks after surgery. °· Do not drive until your doctor says it is okay. °· Ask your doctor when you can go back to work. °· Ask your doctor when you can begin sexual activity again. °· Follow up with your doctor as told. °Contact a doctor if: °· You have puffiness, redness, more pain, or fluid draining from the incision site. °· You have a fever. °· You have puffiness in your  ankles or legs. °· You have pain in your legs. °· You gain 2 or more pounds (0.9 kg) a day. °· You feel sick to your stomach (nauseous) or throw up (vomit). °· You have watery poop (diarrhea). °Get help right away if: °· You have chest pain that goes to your jaw or arms. °· You have shortness of breath. °· You have a fast or irregular heartbeat. °· You notice a "clicking" in your breastbone when you move. °· You have numbness or weakness in your arms or legs. °· You feel dizzy or light-headed. °This information is not intended to replace advice given to you by your health care provider. Make sure you discuss any questions you have with your health care provider. °Document Released: 12/08/2013 Document Revised: 05/10/2016 Document Reviewed: 05/12/2013 °Elsevier Interactive Patient Education © 2017 [employer] ° °

## 2018-08-22 NOTE — Discharge Summary (Addendum)
Physician Discharge Summary  Patient ID: Alex Bryant MRN: [id number] DOB/AGE: [birth year] 67 y.o.  Admit date: 08/14/2018 Discharge date: 08/23/2018  Admission Diagnoses: Progressive angina  Discharge Diagnoses:  Principal Problem:   Progressive angina ([place]) Active Problems:   Diabetes mellitus ([place])   Mixed hyperlipidemia   Essential hypertension   CAD (coronary artery disease)   S/P CABG x 5  Patient Active Problem List   Diagnosis Date Noted  . 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 CAD (coronary artery disease) 08/14/2018  . Progressive angina ([place]) 08/12/2018  . Vitamin B 12 deficiency 11/21/2017  . Diabetic neuropathy ([place]) 01/22/2017  . Cerebral thrombosis with cerebral infarction 11/20/2016  . History of CVA (cerebrovascular accident) without residual deficits 11/20/2016  . 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 Plantar fasciitis of right foot 02/17/2014  . Lumbosacral spondylosis without myelopathy 05/25/2013  . Essential hypertension 01/24/2011  . MEDIAL MENISCUS TEAR, RIGHT 07/06/2010  . Diabetes mellitus ([place]) 01/06/2009  . URTICARIA 01/06/2009  . Tubular adenoma of colon 08/26/2008  . Mixed hyperlipidemia 02/24/2008  . History of gout 11/14/2007  . Sleep disorder 09/08/2007  . 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  He has multiple cardiac risk factors including diabetes, hypertension and hyperlipidemia.  He has a family history of heart disease as well with his father having CABG at age 49.  He has a history of a previous CVA which was cryptogenic.  Has had a previous loop recorder implanted in 2017 which did not evidence atrial fibrillation.  He has remained on Plavix.  Approximately 2 weeks prior to admission he noticed some increasing shortness  of breath with activity.  While playing golf in [place] he noticed when walking up an incline he had increased dyspnea as well as substernal chest pain.  He was felt to require further evaluation and treatment including outpatient cardiac catheterization.  This was done and revealed severe multivessel coronary artery disease.  Due to these findings cardiothoracic surgical consultation was obtained with Dahlia Byes MD who evaluated the patient and studies and agreed with recommendations to proceed with CABG.  Ejection fraction was noted to be 55 to 65%.  Echocardiogram showed no significant valvular defects.  Discharged Condition: good  Hospital Course: The patient was admitted for outpatient cardiac catheterization.  Due to the findings described above but it was felt that he should remain inpatient and obtain surgical consultation with Dr. Darcey Nora.  He was medically stabilized and there was a time interval for Plavix washout.  On 08/19/2018 he was taken to the operating room where he underwent the below described procedure.  He tolerated well was taken to the surgical intensive care unit in stable condition.  Postoperative hospital course:  The patient is overall progressed well.  He was weaned from the ventilator without difficulty using standard protocols.  He has remained hemodynamically stable in sinus rhythm.  He does have occasional PACs but not had atrial fibrillation.  He has an expected acute blood loss anemia which is stable.  He has some postoperative volume overload which is responding well to diuretics.  His blood sugars have been under good control using standard measures.  He is being transition to his home Metformin dosage.  His Plavix will be resumed at time of discharge.  Oxygen has  been weaned and he maintains good saturations on room air.  Incisions are healing well without evidence of infection.  He is tolerating diet.  He is tolerating gradually increasing activities using  standard cardiac rehab phase 1 modalities. Wounds are clean, dry and continuing to heal. He did go into atrial fibrillation on 09/06 and quickly converted to sinus rhythm after being given IV Amiodarone. He is remaining in sinus rhythm. Dr. Prescott Gum saw and evaluated the patient this am. Per Dr. Prescott Gum, will transition to oral Amiodarone, remove EPW, chest tube sutures will remain and be removed on 09/18, and he will be continued on Lasix and a potassium supplement for 3 more days. Patient is felt surgically stable for discharge today.  Consults: cardiology  Significant Diagnostic Studies: angiography: Cardiac catheterization and echocardiogram  Treatments: surgery:  OPERATIVE REPORT   DATE OF PROCEDURE:  08/19/2018  OPERATION: 1.  Coronary artery bypass grafting x5 (left internal mammary artery to left anterior descending, saphenous vein graft to ramus intermediate, sequential saphenous vein graft to obtuse marginal 1 and obtuse marginal 2, saphenous vein graft to posterior  descending. 2.  Endoscopic harvest of right leg greater saphenous vein.  SURGEON:  Ivin Poot, III, MD  ASSISTANT:  Jadene Pierini PA-C.  PREOPERATIVE DIAGNOSES:  Severe 3-vessel coronary artery disease, unstable angina.  POSTOPERATIVE DIAGNOSES:  Severe 3-vessel coronary artery disease, unstable angina.  ANESTHESIA:  General by Dr. Roberts Gaudy. Discharge Exam: Blood pressure (!) 142/79, pulse 77, temperature 98.6 F (37 C), temperature source Oral, resp. rate 19, height 6' (1.829 m), weight 94.1 kg, SpO2 92 %. Cardiovascular: RRR Pulmonary: Clear to auscultation bilaterally Abdomen: Soft, non tender, bowel sounds present. Extremities: Trace bilateral lower extremity edema. Wounds: Clean and dry.  No erythema or signs of infection.  Disposition: Discharge disposition: 01-Home or Self Care       Allergies as of 08/23/2018      Reactions   Oxycodone Anxiety   [name], confused, and constipation       Medication List    STOP taking these medications   simvastatin 20 MG tablet Commonly known as:  ZOCOR     TAKE these medications   amiodarone 200 MG tablet Commonly known as:  PACERONE Take 2 tablets (400 mg total) by mouth 2 (two) times daily. For 5 days;then take Amiodarone 200 mg bid for one week; Then take Amiodarone 200 mg daily thereafter   aspirin 81 MG EC tablet Take 1 tablet (81 mg total) by mouth daily.   clopidogrel 75 MG tablet Commonly known as:  PLAVIX Take 1 tablet (75 mg total) by mouth daily.   Fish Oil 1000 MG Caps Take 1 capsule (1,000 mg total) by mouth daily. What changed:  how much to take   freestyle lancets 1 each by Other route 2 (two) times daily. Use to help check blood sugar twice a day. Dx E11.9   furosemide 40 MG tablet Commonly known as:  LASIX Take 1 tablet (40 mg total) by mouth daily. For 3 days then stop.   glucose blood test strip 1 each by Other route as needed. Use as instructed   metFORMIN 1000 MG tablet Commonly known as:  GLUCOPHAGE TAKE 1 TABLET(1000 MG) BY MOUTH TWICE DAILY WITH A MEAL What changed:  See the new instructions.   metoprolol tartrate 25 MG tablet Commonly known as:  LOPRESSOR Take 1 tablet (25 mg total) by mouth 2 (two) times daily.   Oxycodone HCl 10 MG Tabs Take  10 mg every 4-6 hours PRN severe pain.   potassium chloride 10 MEQ tablet Commonly known as:  K-DUR,KLOR-CON Take 2 tablets (20 mEq total) by mouth once for 1 dose. For 3 days then stop.   ramipril 2.5 MG capsule Commonly known as:  ALTACE Take 1 capsule (2.5 mg total) by mouth daily. What changed:    medication strength  how much to take   rosuvastatin 20 MG tablet Commonly known as:  CRESTOR Take 1 tablet (20 mg total) by mouth daily at 6 PM.   zolpidem 5 MG tablet Commonly known as:  AMBIEN TAKE 1 TABLET(5 MG) BY MOUTH AT BEDTIME AS NEEDED     The patient has been discharged on:   1.Beta Blocker:  Yes [ x  ]                               No   [   ]                              If No, reason:  2.Ace Inhibitor/ARB: Yes [ x  ]                                     No  [    ]                                     If No, reason:  3.Statin:   Yes [ x  ]                  No  [   ]                  If No, reason:  4.Ecasa:  Yes  [ x  ]                  No   [   ]                  If No, reason:    Follow-up Information    Ivin Poot, MD. Go on 09/17/2018.   Specialty:  Cardiothoracic Surgery Why:  Appointment to see the surgeon on September 17, 2018 at 2:30 PM.  Is obtain a chest x-ray at [facility] at 2 PM.  [facility] imaging is located in the same office complex on the first floor. Contact information: [address] [place] [address] [phone]        Lorretta Harp, MD Follow up.   Specialties:  Cardiology, Radiology Why:  Please see discharge paperwork for a 2-week follow-up appointment with cardiology. Contact information: [address] [place] [address] [phone]        Dr. Prescott Gum. Go on 09/03/2018.   Why:  Appointment is to have chest tube sutures removed only. Appointment time is at 9:30 am Contact information: [place] [address]          The patient has been discharged on:   1.Beta Blocker:  Yes [  y ]  No   [   ]                              If No, reason:  2.Ace Inhibitor/ARB: Yes [ y  ]                                     No  [    ]                                     If No, reason:  3.Statin:   Yes [ y  ]                  No  [   ]                  If No, reason:  4.Ecasa:  Yes  Blue.[place]   ]                  No   [   ]                  If No, reason:     Signed: Donielle Lang Snow 08/23/2018, 9:35 AM   patient examined and medical record reviewed,agree with above note. [name] 08/25/2018

## 2018-08-22 NOTE — Progress Notes (Signed)
Progress Note  Patient Name: Alex Bryant Date of Encounter: 08/22/2018  Primary Cardiologist: Quay Burow, MD   Subjective   Postop day #3 CABG x5 by Dr. Dahlia Byes with a LIMA to the LAD, SVG to OM 1 and 2 sequentially, ramus branch and PDA.. Off all drips.  All tubes and lines have been discontinued.  He is been ambulating around the unit without limitation.  He does complain of some chest wall pain this morning.   Inpatient Medications    Scheduled Meds: . acetaminophen  1,000 mg Oral Q6H   Or  . acetaminophen (TYLENOL) oral liquid 160 mg/5 mL  1,000 mg Per Tube Q6H  . aspirin EC  81 mg Oral Daily  . bisacodyl  10 mg Oral Daily   Or  . bisacodyl  10 mg Rectal Daily  . docusate sodium  200 mg Oral Daily  . ferrous [id number] C-folic acid  1 capsule Oral BID PC  . furosemide  40 mg Intravenous Daily  . [START ON 08/23/2018] furosemide  40 mg Oral Daily  . insulin aspart  0-24 Units Subcutaneous TID AC & HS  . ketorolac  15 mg Intravenous Q6H  . metFORMIN  500 mg Oral BID WC  . metoprolol tartrate  25 mg Oral BID  . pantoprazole  40 mg Oral Daily  . ramipril  2.5 mg Oral Daily  . rosuvastatin  40 mg Oral q1800  . sodium chloride flush  3 mL Intravenous Q12H  . sodium chloride flush  3 mL Intravenous Q12H   Continuous Infusions: . sodium chloride Stopped (08/21/18 1004)   PRN Meds: sodium chloride, alum & mag hydroxide-simeth, lip balm, magnesium hydroxide, menthol-cetylpyridinium, metoprolol tartrate, midazolam, ondansetron (ZOFRAN) IV, oxyCODONE, sodium chloride flush, sodium chloride flush, sodium chloride flush, temazepam, traMADol   Vital Signs    Vitals:   08/22/18 0400 08/22/18 0500 08/22/18 0600 08/22/18 0817  BP: 130/82   140/72  Pulse:    83  Resp: 17 20  (!) 30  Temp: 97.9 F (36.6 C)     TempSrc: Oral     SpO2:    93%  Weight:   95.3 kg   Height:        Intake/Output Summary (Last 24 hours) at 08/22/2018 0900 Last data filed at  08/22/2018 0800 Gross per 24 hour  Intake 923.2 ml  Output 1735 ml  Net -811.8 ml   Filed Weights   08/20/18 0500 08/21/18 0600 08/22/18 0600  Weight: 98.3 kg 97.6 kg 95.3 kg    Telemetry    Sinus rhythm - Personally Reviewed  ECG    Not performed today.- Personally Reviewed  Physical Exam   GEN: No acute distress.   Neck: No JVD Cardiac: RRR, no murmurs, rubs, or gallops.  Respiratory: Clear to auscultation bilaterally. GI: Soft, nontender, non-distended  MS: No edema; No deformity. Neuro:  Nonfocal  Psych: Normal affect   Labs    Chemistry Recent Labs  Lab 08/20/18 0403 08/20/18 1619 08/20/18 1624 08/21/18 0500 08/22/18 0320  NA 139  --  136 137 137  K 4.1  --  4.3 4.7 4.2  CL 111  --  102 107 103  CO2 22  --   --  24 24  GLUCOSE 108*  --  167* 123* 159*  BUN 15  --  14 16 14   CREATININE 1.01 1.12 1.10 1.05 1.11  CALCIUM 8.0*  --   --  8.2* 8.5*  GFRNONAA >60 >60  --  >  60 >60  GFRAA >60 >60  --  >60 >60  ANIONGAP 6  --   --  6 10     Hematology Recent Labs  Lab 08/20/18 1619 08/20/18 1624 08/21/18 0500 08/22/18 0320  WBC 8.8  --  8.2 8.2  RBC 3.49*  --  3.03* 3.35*  HGB 10.6* 9.9* 9.2* 10.4*  HCT 31.7* 29.0* 28.0* 29.7*  MCV 90.8  --  92.4 88.7  MCH 30.4  --  30.4 31.0  MCHC 33.4  --  32.9 35.0  RDW 13.1  --  13.2 12.7  PLT 144*  --  130* 145*    Cardiac EnzymesNo results for input(s): TROPONINI in the last 168 hours. No results for input(s): TROPIPOC in the last 168 hours.   BNPNo results for input(s): BNP, PROBNP in the last 168 hours.   DDimer No results for input(s): DDIMER in the last 168 hours.   Radiology    Dg Chest 2 View  Result Date: 08/22/2018 CLINICAL DATA:  Recent coronary artery bypass grafting. Chest tube removal. EXAM: CHEST - 2 VIEW COMPARISON:  August 21, 2018 FINDINGS: Chest tube has been removed from the left side without pneumothorax. Temporary pacemaker wires remain attached to the right ventricle. There is new  atelectatic change in the left mid lung. There is also atelectatic change in the posterior left base with minimal left pleural effusion. Right lung is clear. There is a minimal right pleural effusion. Heart is upper normal in size with pulmonary vascularity normal. No adenopathy. Patient is status post coronary artery bypass grafting. There is aortic atherosclerosis. No bone lesions. Loop recorder present on the left anteriorly. IMPRESSION: No pneumothorax following chest tube removal from the left side. There is new left midlung atelectasis. There is also atelectatic change in left base. There are small pleural effusions bilaterally. Stable cardiac silhouette. There is aortic atherosclerosis. Aortic Atherosclerosis (ICD10-I70.0). Electronically Signed   By: Lowella Grip III M.D.   On: 08/22/2018 08:27   Dg Chest Port 1 View  Result Date: 08/21/2018 CLINICAL DATA:  Status post CABG EXAM: PORTABLE CHEST 1 VIEW COMPARISON:  08/20/2018 FINDINGS: A Swan-Ganz catheter has been removed with RIGHT IJ venous sheath remaining. Cardiomegaly and CABG changes again noted. A LEFT thoracostomy tube remains. Mild bibasilar atelectasis identified. No pneumothorax. IMPRESSION: Swan-Ganz catheter removal without other significant change. Mild bibasilar atelectasis. No pneumothorax. Electronically Signed   By: Margarette Canada M.D.   On: 08/21/2018 10:43    Cardiac Studies   2D echocardiogram (08/14/2018)  Study Conclusions  - Left ventricle: The cavity size was normal. There was moderate   concentric hypertrophy. Systolic function was normal. The   estimated ejection fraction was in the range of 60% to 65%. Wall   motion was normal; there were no regional wall motion   abnormalities. Doppler parameters are consistent with abnormal   left ventricular relaxation (grade 1 diastolic dysfunction).   There was no evidence of elevated ventricular filling pressure by   Doppler parameters. - Aortic valve: There was no  regurgitation. - Mitral valve: Calcified annulus. - Right ventricle: The cavity size was normal. Wall thickness was   normal. Systolic function was normal. - Right atrium: The atrium was normal in size. - Tricuspid valve: There was no regurgitation. - Inferior vena cava: The vessel was normal in size. - Pericardium, extracardiac: There was no pericardial effusion.  Cardiac catheterization (8/29//19)   Prox Cx lesion is 75% stenosed.  Mid Cx to Dist Cx lesion is  90% stenosed.  Ost 2nd Mrg lesion is 95% stenosed.  Ost 2nd Mrg to 2nd Mrg lesion is 70% stenosed.  Ost LAD to Prox LAD lesion is 95% stenosed.  Prox LAD lesion is 95% stenosed.  Ost Ramus to Ramus lesion is 90% stenosed.  Ramus lesion is 90% stenosed.  Prox RCA to Mid RCA lesion is 90% stenosed.  Dist RCA lesion is 95% stenosed.  The left ventricular systolic function is normal.  LV end diastolic pressure is normal.  The left ventricular ejection fraction is 55-65% by visual estimate.   Patient Profile     67 y.o. male married with 3 children history of hypertension, hyperlipidemia and diabetes is lost prior TIA who underwent diagnostic coronary angiography via the right radial approach on 08/14/2018 revealing severe three-vessel disease and preserved LV function.  He underwent CABG x5 on 08/20/2018 by Dr. Dahlia Byes without complication.  His EF was normal.  He was extubated without incident has remained hemodynamic with stable with good oxygenation.  Vital signs are stable.  Assessment & Plan    1: Coronary artery disease- cardiac catheterization revealing three-vessel disease postop day #3 CABG x5 doing well.  He does have some moderate chest wall pain being treated with analgesics.  2: Essential hypertension- on ramipril and metoprolol  3: Hyperlipidemia- on high-dose statin therapy  Overall doing better than expected.  Acute blood loss the knee is stable.  He is getting diuresed for mild volume  overload per standard protocol.  He is clinically stable.  Normal progression per TCT S.  All lines and chest tubes have been discontinued.  The patient is up and ambulatory.  We will see again on the last day of his hospitalization to arrange follow-up with me in the office in 2 to 3 weeks.  Appreciate Dr. Darcey Nora and his team's excellent care!!!     For questions or updates, please contact [place] Please consult [email or web address] for contact info under Cardiology/STEMI.      Signed, Quay Burow, MD  08/22/2018, 9:00 AM

## 2018-08-22 NOTE — Progress Notes (Addendum)
[address]       [place] [address]             [phone]      3 Days Post-Op Procedure(s) (LRB): CORONARY ARTERY BYPASS GRAFTING (CABG) x 5  USING LEFT INTERNAL MAMMARY ARTERY AND ENDOSCOPICALLY HARVESTED RIGHT SAPHENOUS VEIN. (N/A) TRANSESOPHAGEAL ECHOCARDIOGRAM (TEE) (N/A) Subjective: Feels generally sore  Objective: Vital signs in last 24 hours: Temp:  [97.6 F (36.4 C)-98.7 F (37.1 C)] 97.9 F (36.6 C) (09/06 0400) Pulse Rate:  [68-78] 78 (09/05 1813) Cardiac Rhythm: Normal sinus rhythm (09/06 0400) Resp:  [14-30] 20 (09/06 0500) BP: (124-160)/(66-84) 130/82 (09/06 0400) SpO2:  [89 %-100 %] 96 % (09/05 2055) Weight:  [95.3 kg] 95.3 kg (09/06 0600)  Hemodynamic parameters for last 24 hours:    Intake/Output from previous day: 09/05 0701 - 09/06 0700 In: 1043.2 [P.O.:840; I.V.:3.2; IV Piggyback:200] Out: 1850 [Urine:1775; Stool:5; Chest Tube:70] Intake/Output this shift: No intake/output data recorded.  General appearance: alert, cooperative and no distress Heart: regular rate and rhythm Lungs: mildly dim in bases Abdomen: benign Extremities: + LE edemaR>L  Wound: evh site ok, chest dressing intact  Lab Results: Recent Labs    08/21/18 0500 08/22/18 0320  WBC 8.2 8.2  HGB 9.2* 10.4*  HCT 28.0* 29.7*  PLT 130* 145*   BMET:  Recent Labs    08/21/18 0500 08/22/18 0320  NA 137 137  K 4.7 4.2  CL 107 103  CO2 24 24  GLUCOSE 123* 159*  BUN 16 14  CREATININE 1.05 1.11  CALCIUM 8.2* 8.5*    PT/INR:  Recent Labs    08/19/18 1403  LABPROT 17.4*  INR 1.44   ABG    Component Value Date/Time   PHART 7.353 08/19/2018 1831   HCO3 23.7 08/19/2018 1831   TCO2 23 08/20/2018 1624   ACIDBASEDEF 2.0 08/19/2018 1831   O2SAT 96.0 08/19/2018 1831   CBG (last 3)  Recent Labs    08/21/18 1610 08/21/18 2144 08/22/18 0702  GLUCAP 185* 145* 161*    Meds Scheduled Meds: . acetaminophen  1,000 mg Oral Q6H   Or  .  acetaminophen (TYLENOL) oral liquid 160 mg/5 mL  1,000 mg Per Tube Q6H  . aspirin EC  81 mg Oral Daily  . bisacodyl  10 mg Oral Daily   Or  . bisacodyl  10 mg Rectal Daily  . Chlorhexidine Gluconate Cloth  6 each Topical Daily  . docusate sodium  200 mg Oral Daily  . ferrous [id number] C-folic acid  1 capsule Oral BID PC  . furosemide  40 mg Intravenous Daily  . [START ON 08/23/2018] furosemide  40 mg Oral Daily  . insulin aspart  0-24 Units Subcutaneous TID AC & HS  . metoprolol tartrate  25 mg Oral BID  . pantoprazole  40 mg Oral Daily  . ramipril  2.5 mg Oral Daily  . rosuvastatin  40 mg Oral q1800  . sodium chloride flush  3 mL Intravenous Q12H  . sodium chloride flush  3 mL Intravenous Q12H   Continuous Infusions: . sodium chloride Stopped (08/21/18 1004)   PRN Meds:.sodium chloride, alum & mag hydroxide-simeth, lip balm, magnesium hydroxide, menthol-cetylpyridinium, metoprolol tartrate, midazolam, ondansetron (ZOFRAN) IV, oxyCODONE, sodium chloride flush, sodium chloride flush, sodium chloride flush, temazepam, traMADol  Xrays Dg Chest Port 1 View  Result Date: 08/21/2018 CLINICAL DATA:  Status post CABG EXAM: PORTABLE CHEST 1 VIEW COMPARISON:  08/20/2018 FINDINGS: A Swan-Ganz  catheter has been removed with RIGHT IJ venous sheath remaining. Cardiomegaly and CABG changes again noted. A LEFT thoracostomy tube remains. Mild bibasilar atelectasis identified. No pneumothorax. IMPRESSION: Swan-Ganz catheter removal without other significant change. Mild bibasilar atelectasis. No pneumothorax. Electronically Signed   By: Margarette Canada M.D.   On: 08/21/2018 10:43    Assessment/Plan: S/P Procedure(s) (LRB): CORONARY ARTERY BYPASS GRAFTING (CABG) x 5  USING LEFT INTERNAL MAMMARY ARTERY AND ENDOSCOPICALLY HARVESTED RIGHT SAPHENOUS VEIN. (N/A) TRANSESOPHAGEAL ECHOCARDIOGRAM (TEE) (N/A)  1 conts to progress well, c/o soreness, discussed pain meds trying to stay ahead of pain 2  hemodyn stable in sinus, some PAC's- cont to monitor 3 CXR without significant atx/edema or effusions 4 ABL anemia is improved- monitor 5 mild volume overload- cont diuretic for 5 days post discharge  7 sugar control pretty good- restart metformin- half dose 8 crestor currently - resume zocor at discharge  9 epw's for removal in am with poss discharge tomorrow afternoon 10 resume plavix at discharge  11 to 4 e- cont pulm toilet/rehab- poss home tomorrow afternoon  LOS: 8 days    Alex Bryant 08/22/2018 Pager [phone]  2pm  Patient progressing well but now in afib Will start IV  patient examined and medical record reviewed,agree with above note. Alex Bryant 08/22/2018   amiodarone and monitor

## 2018-08-22 NOTE — Plan of Care (Signed)
Nutrition Education Note  RD consulted for nutrition education regarding a Heart Healthy diet and CHO Modified.  Pt reports that he is at work by 6:15 am but does not eat lunch until noon. Both his noon and dinner (8pm) meals are eaten outside of the home. We discussed eating more home cooked meals at dinner and what modifications to make to his outside meals.   Lipid Panel     Component Value Date/Time   CHOL 141 05/22/2018 0805   CHOL 153 05/25/2015 0924   TRIG 161.0 (H) 05/22/2018 0805   TRIG 188 (H) 05/25/2015 0924   HDL 39.00 (L) 05/22/2018 0805   HDL 38 (L) 05/25/2015 0924   CHOLHDL 4 05/22/2018 0805   VLDL 32.2 05/22/2018 0805   LDLCALC 70 05/22/2018 0805   LDLCALC 77 05/25/2015 0924    RD provided "Heart Healthy Nutrition Therapy" handout from the Academy of [employer]. Reviewed patient's dietary recall. Provided examples on ways to decrease sodium and fat intake in diet. Discouraged intake of processed foods and use of salt shaker. Encouraged fresh fruits and vegetables as well as whole grain sources of carbohydrates to maximize fiber intake. Teach back method used.  Expect good compliance.  Body mass index is 29.45 kg/m. Pt meets criteria for overweight based on current BMI.  Current diet order is CHO Modified/Heart Healthy, patient is consuming approximately 100% of meals at this time. Labs and medications reviewed. No further nutrition interventions warranted at this time. RD contact information provided. If additional nutrition issues arise, please re-consult RD.  [place], [place], [place] Pager [phone] After Hours Pager

## 2018-08-22 NOTE — Progress Notes (Signed)
  Amiodarone Drug - Drug Interaction Consult Note  Recommendations:   Amiodarone is metabolized by the cytochrome P450 system and therefore has the potential to cause many drug interactions. Amiodarone has an average plasma half-life of 50 days (range 20 to 100 days).   There is potential for drug interactions to occur several weeks or months after stopping treatment and the onset of drug interactions may be slow after initiating amiodarone.   [x]  Statins: Increased risk of myopathy. Simvastatin- restrict dose to 20mg  daily. Other statins: counsel patients to report any muscle pain or weakness immediately.      - on Crestor 40 mg daily. LFTs normal on 08/14/18  [x]  Beta blockers: increased risk of bradycardia, AV block and myocardial depression. Sotalol - avoid concomitant use.                - on Metoprolol 25 mg BID  [x]  Diuretics: increased risk of cardiotoxicity if hypokalemia occurs.               - lasix 40 mg PO daily to begin 9/7. K+ 4.2 today.  Kelvin Cellar, RPh 08/22/2018 3:58 PM

## 2018-08-23 ENCOUNTER — Other Ambulatory Visit: Payer: Self-pay | Admitting: Surgery

## 2018-08-23 ENCOUNTER — Other Ambulatory Visit: Payer: Self-pay | Admitting: Physician Assistant

## 2018-08-23 LAB — BASIC METABOLIC PANEL
Anion gap: 11 (ref 5–15)
BUN: 17 mg/dL (ref 8–23)
CO2: 23 mmol/L (ref 22–32)
Calcium: 8.3 mg/dL — ABNORMAL LOW (ref 8.9–10.3)
Chloride: 104 mmol/L (ref 98–111)
Creatinine, Ser: 1.18 mg/dL (ref 0.61–1.24)
GFR calc Af Amer: >60 mL/min (ref >60)
GFR calc non Af Amer: >60 mL/min (ref >60)
Glucose, Bld: 201 mg/dL — ABNORMAL HIGH (ref 70–99)
Potassium: 3.8 mmol/L (ref 3.5–5.1)
Sodium: 138 mmol/L (ref 135–145)

## 2018-08-23 LAB — CBC
HCT: 29.3 % — ABNORMAL LOW (ref 39.0–52.0)
Hemoglobin: 10.1 g/dL — ABNORMAL LOW (ref 13.0–17.0)
MCH: 30.7 pg (ref 26.0–34.0)
MCHC: 34.5 g/dL (ref 30.0–36.0)
MCV: 89.1 fL (ref 78.0–100.0)
Platelets: 181 10*3/uL (ref 150–400)
RBC: 3.29 MIL/uL — ABNORMAL LOW (ref 4.22–5.81)
RDW: 12.7 % (ref 11.5–15.5)
WBC: 7 10*3/uL (ref 4.0–10.5)

## 2018-08-23 LAB — GLUCOSE, CAPILLARY
GLUCOSE-CAPILLARY: 171 mg/dL — AB (ref 70–99)
GLUCOSE-CAPILLARY: 148 mg/dL — AB (ref 70–99)

## 2018-08-23 MED ORDER — FISH OIL 1000 MG PO CAPS: 1000.0000 mg | ORAL_CAPSULE | Freq: Every day | ORAL | 0 refills | Status: DC

## 2018-08-23 MED ORDER — ASPIRIN 81 MG PO TBEC: 81.0000 mg | DELAYED_RELEASE_TABLET | Freq: Every day | ORAL | Status: DC

## 2018-08-23 MED ORDER — AMIODARONE HCL 200 MG PO TABS: 400.0000 mg | ORAL_TABLET | Freq: Two times a day (BID) | ORAL | 1 refills | Status: DC

## 2018-08-23 MED ORDER — METOPROLOL TARTRATE 25 MG PO TABS: 25.0000 mg | ORAL_TABLET | Freq: Two times a day (BID) | ORAL | 1 refills | Status: DC

## 2018-08-23 MED ORDER — OXYCODONE HCL 10 MG PO TABS: ORAL_TABLET | ORAL | 0 refills | Status: DC

## 2018-08-23 MED ORDER — POTASSIUM CHLORIDE CRYS ER 10 MEQ PO TBCR: 20.0000 meq | EXTENDED_RELEASE_TABLET | Freq: Once | ORAL | 0 refills | Status: DC

## 2018-08-23 MED ORDER — ROSUVASTATIN CALCIUM 20 MG PO TABS: 20.0000 mg | ORAL_TABLET | Freq: Every day | ORAL | 1 refills | Status: DC

## 2018-08-23 MED ORDER — POTASSIUM CHLORIDE CRYS ER 20 MEQ PO TBCR
30.0000 meq | EXTENDED_RELEASE_TABLET | Freq: Once | ORAL | Status: AC
  Administered 2018-08-23: 30 meq via ORAL
  Filled 2018-08-23: qty 1

## 2018-08-23 MED ORDER — RAMIPRIL 2.5 MG PO CAPS: 2.5000 mg | ORAL_CAPSULE | Freq: Every day | ORAL | 1 refills | Status: DC

## 2018-08-23 MED ORDER — FUROSEMIDE 40 MG PO TABS: 40.0000 mg | ORAL_TABLET | Freq: Every day | ORAL | 0 refills | Status: DC

## 2018-08-23 MED ORDER — AMIODARONE HCL 200 MG PO TABS
400.0000 mg | ORAL_TABLET | Freq: Two times a day (BID) | ORAL | Status: DC
  Administered 2018-08-23: 400 mg via ORAL
  Filled 2018-08-23: qty 2

## 2018-08-23 MED ORDER — CLOPIDOGREL BISULFATE 75 MG PO TABS: 75.0000 mg | ORAL_TABLET | Freq: Every day | ORAL | 1 refills | Status: DC

## 2018-08-23 NOTE — Progress Notes (Signed)
CARDIAC REHAB PHASE I   PRE:  Rate/Rhythm: sinus rhythm 69  BP:  Supine:   Sitting:  154/79   Standing:    SaO2: 94% ra   MODE:  Ambulation: 450  ft   POST:  Rate/Rhythem: 73 sinus rhythm BP:  Supine:   Sitting:158/74    Standing:    SaO2: 95%  ra  1055-1148 Pt ambulated in hallway x assist.steady gait, asymptomatic.  Pt returned to chair, call light in reach, wife at bedside.  Education completed including risk factor modification, post op diet, exercise, and medication compliance.  Pt oriented to outpatient cardiac rehab.  At pt request, referral will be sent to [place].  Understanding verbalized Andi Hence, RN, BSN Cardiac Pulmonary Rehab      [facility]

## 2018-08-23 NOTE — Progress Notes (Signed)
  Amiodarone Drug - Drug Interaction Consult Note  Recommendations:   Amiodarone is metabolized by the cytochrome P450 system and therefore has the potential to cause many drug interactions. Amiodarone has an average plasma half-life of 50 days (range 20 to 100 days).   There is potential for drug interactions to occur several weeks or months after stopping treatment and the onset of drug interactions may be slow after initiating amiodarone.   [x]  Statins: Increased risk of myopathy. Simvastatin- restrict dose to 20mg  daily. Other statins: counsel patients to report any muscle pain or weakness immediately.      - on Crestor 40 mg daily. LFTs normal on 08/14/18  [x]  Beta blockers: increased risk of bradycardia, AV block and myocardial depression. Sotalol - avoid concomitant use.                - on Metoprolol 25 mg BID  [x]  Diuretics: increased risk of cardiotoxicity if hypokalemia occurs.               - lasix 40 mg PO daily to begin 9/7. K+ 3.8 today.   Georga Bora, PharmD Clinical Pharmacist 08/23/2018 2:40 PM Please check AMION for all [place] numbers

## 2018-08-23 NOTE — Progress Notes (Signed)
Pt given AVS handout; vebalized understanding. VSS; IV removed. Prescriptions given. Pt escorted to main entrance via wheelchair by RN.  Lilla Shook, BSN

## 2018-08-23 NOTE — Progress Notes (Addendum)
      [address]       [place] [address]             [phone]        4 Days Post-Op Procedure(s) (LRB): CORONARY ARTERY BYPASS GRAFTING (CABG) x 5  USING LEFT INTERNAL MAMMARY ARTERY AND ENDOSCOPICALLY HARVESTED RIGHT SAPHENOUS VEIN. (N/A) TRANSESOPHAGEAL ECHOCARDIOGRAM (TEE) (N/A)  Subjective: Patient sitting on edge of bed this am. He has no specific complaints. Dr. Prescott Gum has already seen and spoke with him this am.  Objective: Vital signs in last 24 hours: Temp:  [98 F (36.7 C)-98.7 F (37.1 C)] 98.6 F (37 C) (09/07 0832) Pulse Rate:  [70-138] 77 (09/07 0832) Cardiac Rhythm: Normal sinus rhythm (09/07 0832) Resp:  [19-29] 19 (09/07 0832) BP: (128-142)/(68-86) 142/79 (09/07 0832) SpO2:  [92 %-95 %] 92 % (09/07 0832) Weight:  [94.1 kg-98.5 kg] 94.1 kg (09/07 0504)  Pre op weight 95 kg Current Weight  08/23/18 94.1 kg       Intake/Output from previous day: 09/06 0701 - 09/07 0700 In: 705 [P.O.:480; I.V.:225] Out: 750 [Urine:750]   Physical Exam:  Cardiovascular: RRR Pulmonary: Clear to auscultation bilaterally Abdomen: Soft, non tender, bowel sounds present. Extremities: Trace bilateral lower extremity edema. Wounds: Clean and dry.  No erythema or signs of infection.  Lab Results: CBC: Recent Labs    08/22/18 0320 08/23/18 0259  WBC 8.2 7.0  HGB 10.4* 10.1*  HCT 29.7* 29.3*  PLT 145* 181   BMET:  Recent Labs    08/22/18 0320 08/23/18 0259  NA 137 138  K 4.2 3.8  CL 103 104  CO2 24 23  GLUCOSE 159* 201*  BUN 14 17  CREATININE 1.11 1.18  CALCIUM 8.5* 8.3*    PT/INR:  Lab Results  Component Value Date   INR 1.44 08/19/2018   INR 1.10 08/15/2018   INR 1.07 11/19/2016   ABG:  INR: Will add last result for INR, ABG once components are confirmed Will add last 4 CBG results once components are confirmed  Assessment/Plan:  1. CV - Had brief a fib yesterday but converted quickly back to sinus rhythm after  given Amiodarone. On Amiodarone drip, Lopressor 25 mg bid, Ramipril 2.5 mg daily. Stop Amiodarone drip one hour after first dose of Amiodarone 400 mg bid given. Per Dr. Prescott Gum, DAPT upon discharge 2.  Pulmonary - On room air. Encourage incentive spirometer. 3. Volume Overload - On Lasix 40 mg daily. Per Dr. Prescott Gum, continue for next 3 days as outpatient 4.  Acute blood loss anemia - H and H this am 10.1 and 29.3. On Triniscon 5. Supplement potassium 6. Remove EPW 7. Chest tube sutures to remain-will be removed in the office after discharge 8. Per Dr. Prescott Gum, ok to discharge later today (as long as vital signs stable after EPW removed)  Donielle M ZimmermanPA-C 08/23/2018,8:44 AM [phone]

## 2018-08-23 NOTE — Progress Notes (Signed)
[facility] will sign off.   Medication Recommendations: He is on appropriate secondary prevention with aspirin, metoprolol, and rosuvastatin.  Agree with starting anticoagulation and amiodarone at least through CT surgery follow-up. Other recommendations (labs, testing, etc):  He will need repeat lipids and a CMP in 6 to 8 weeks. Follow up as an outpatient: Scheduled in cardiology clinic 9/23.   Misaki Sozio C. Oval Linsey, MD, [facility] 08/23/2018 1:41 PM

## 2018-08-23 NOTE — Progress Notes (Signed)
EPW removed per protocol; VSS; pt tolerated well. Slight oozing to R EPW site;gauze applied. Pt educated to remain in bed until 1100. Vitals to continue for 3 hours post.  Will continue to monitor.  Lilla Shook, BSN

## 2018-08-25 ENCOUNTER — Telehealth (HOSPITAL_COMMUNITY): Payer: Self-pay

## 2018-08-25 NOTE — Telephone Encounter (Signed)
Called patient to see if he is interested in the Cardiac Rehab Program. Patient expressed interest. Explained scheduling process and went over insurance, patient verbalized understanding. Will contact patient for scheduling once f/u has been completed.  °

## 2018-08-25 NOTE — Telephone Encounter (Signed)
Pt insurance is active and benefits verified through [employer]. Co-pay $0.00, DED $0.00/$0.00 met, out of pocket $3,400.00/$33.91 met, co-insurance 0%.No pre-authorization. Demetrius/HTA, 08/25/18 @ 9:38AM, WNI#[id number]  Will contact patient to see if he is interested in the Cardiac Rehab Program. If interested, patient will need to complete follow up appt. Once completed, patient will be contacted for scheduling upon review by the RN Navigator.

## 2018-08-27 ENCOUNTER — Telehealth: Payer: Self-pay

## 2018-08-27 NOTE — Telephone Encounter (Signed)
Spoke w/ pt and requested that he send a manual transmission b/c him home monitor has not updated in at least 14 days.

## 2018-09-03 ENCOUNTER — Encounter: Payer: Self-pay | Admitting: *Deleted

## 2018-09-03 ENCOUNTER — Ambulatory Visit (INDEPENDENT_AMBULATORY_CARE_PROVIDER_SITE_OTHER): Payer: PPO | Admitting: *Deleted

## 2018-09-03 ENCOUNTER — Other Ambulatory Visit: Payer: Self-pay

## 2018-09-03 ENCOUNTER — Telehealth: Payer: Self-pay | Admitting: *Deleted

## 2018-09-03 ENCOUNTER — Ambulatory Visit (INDEPENDENT_AMBULATORY_CARE_PROVIDER_SITE_OTHER): Payer: Self-pay | Admitting: Cardiothoracic Surgery

## 2018-09-03 ENCOUNTER — Encounter: Payer: Self-pay | Admitting: Cardiology

## 2018-09-03 VITALS — Ht 72.0 in

## 2018-09-03 DIAGNOSIS — I639 Cerebral infarction, unspecified: Secondary | ICD-10-CM | POA: Diagnosis not present

## 2018-09-03 DIAGNOSIS — Z951 Presence of aortocoronary bypass graft: Secondary | ICD-10-CM

## 2018-09-03 DIAGNOSIS — I25110 Atherosclerotic heart disease of native coronary artery with unstable angina pectoris: Secondary | ICD-10-CM

## 2018-09-03 NOTE — Progress Notes (Signed)
  HPI: Patient returns for routine postoperative follow-up having undergone CABG on 9/3. The patient's early postoperative recovery while in the hospital was notable for afib. Since hospital discharge the patient reports no problems.   Current Outpatient Medications  Medication Sig Dispense Refill  . amiodarone (PACERONE) 200 MG tablet Take 2 tablets (400 mg total) by mouth 2 (two) times daily. For 5 days;then take Amiodarone 200 mg bid for one week; Then take Amiodarone 200 mg daily thereafter 60 tablet 1  . aspirin EC 81 MG EC tablet Take 1 tablet (81 mg total) by mouth daily.    . clopidogrel (PLAVIX) 75 MG tablet Take 1 tablet (75 mg total) by mouth daily. 30 tablet 1  . glucose blood test strip 1 each by Other route as needed. Use as instructed     . Lancets (FREESTYLE) lancets 1 each by Other route 2 (two) times daily. Use to help check blood sugar twice a day. Dx E11.9 100 each 3  . metFORMIN (GLUCOPHAGE) 1000 MG tablet TAKE 1 TABLET(1000 MG) BY MOUTH TWICE DAILY WITH A MEAL (Patient taking differently: Take 1,000 mg by mouth 2 (two) times daily with a meal. ) 180 tablet 1  . metoprolol tartrate (LOPRESSOR) 25 MG tablet Take 1 tablet (25 mg total) by mouth 2 (two) times daily. 60 tablet 1  . Omega-3 Fatty Acids (FISH OIL) 1000 MG CAPS Take 1 capsule (1,000 mg total) by mouth daily.  0  . oxyCODONE 10 MG TABS Take 10 mg every 4-6 hours PRN severe pain. 30 tablet 0  . ramipril (ALTACE) 2.5 MG capsule Take 1 capsule (2.5 mg total) by mouth daily. 30 capsule 1  . rosuvastatin (CRESTOR) 20 MG tablet Take 1 tablet (20 mg total) by mouth daily at 6 PM. 30 tablet 1  . zolpidem (AMBIEN) 5 MG tablet TAKE 1 TABLET(5 MG) BY MOUTH AT BEDTIME AS NEEDED 30 tablet 5  . furosemide (LASIX) 40 MG tablet Take 1 tablet (40 mg total) by mouth daily. For 3 days then stop. 3 tablet 0  . potassium chloride (K-DUR,KLOR-CON) 10 MEQ tablet Take 2 tablets (20 mEq total) by mouth once for 1 dose. For 3 days then  stop. 2 tablet 0   No current facility-administered medications for this visit.     Physical Exam: Sutures removed by RN  Diagnostic Tests: none  Impression: Doing well  Plan: rtc 1 week   Len Childs, MD [facility] [phone]

## 2018-09-03 NOTE — Telephone Encounter (Signed)
Manual transmission received and reviewed. Short duration AF episodes noted beginning 08/23/18. Most episodes recorded on 9/10 and 9/11, 66 total episodes noted by LINQ, total AF burden 1.3%. Per Dr. Lucianne Lei Trigt's d/c summary from 08/22/18, AF noted on 9/6, patient given IV amiodarone and transitioned to oral amiodarone. Will review with MD.

## 2018-09-03 NOTE — Telephone Encounter (Signed)
Spoke with patient to request that he send a manual Carelink transmission for review.  Patient agrees to send, though it may not be today as he is recovering from surgery. He denies needing any assistance with transmission. Advised I will call back after transmission received if there are any true episodes. Patient verbalizes agreement with plan.

## 2018-09-04 NOTE — Telephone Encounter (Signed)
Spoke with patient to advise that Dr. Rayann Heman reviewed AF episodes and recommended that we continue to monitor as patient is taking amiodarone. No changes at this time as patient has not had any additional episodes between 9/11-9/18 (last Carelink monitor update on 9/18). Patient is agreeable to plan and verbalizes understanding of information.

## 2018-09-04 NOTE — Progress Notes (Signed)
Carelink Summary Report / Loop Recorder 

## 2018-09-05 ENCOUNTER — Other Ambulatory Visit: Payer: Self-pay | Admitting: Internal Medicine

## 2018-09-05 NOTE — Progress Notes (Signed)
Cardiology Office Note:    Date:  09/08/2018   ID:  Alex Bryant, DOB 07-05-1951, MRN [id number]  PCP:  Binnie Rail, MD  Cardiologist:  Quay Burow, MD   Referring MD: Binnie Rail, MD   Chief Complaint  Patient presents with  . Hospitalization Follow-up    s/p CABG    History of Present Illness:    Alex Bryant is a 67 y.o. male with a hx of diabetes,  Hypertension, and hyperlipidemia. 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  Implantable loop recorder in 2017 was negative for A. Fib. He was on plavix after stroke. He recently saw cardiology for DOE and was scheduled for heart catheterization. Heart cath revealed multivessel disease and TCTS was consulted. He underwent CABG x 5 (LIMA-LAD, sequential SVG-OM1-OM2, SVG-Ramus, SVG-PDA) on 08/19/18. Post-op course complicated by atrial fibrillation treated with amiodarone.   He returns for follow-up.  Overall he is doing quite well.  EKG was normal sinus rhythm.  He was not taking a statin medication prior to surgery, LDL was 70.  He was discharged on 20 mg of Crestor and is doing well on this dose.  He is unsure that he is taking Plavix states that he is taking whenever he was on his discharge medication list.  He denies chest pain, shortness of breath, lower extremity swelling, orthopnea, and syncope.  Past Medical History:  Diagnosis Date  . Coronary artery disease   . Diet-controlled diabetes mellitus ([place])    AODM  . History of gout   . Hyperlipidemia   . Hypertension   . Progressive angina ([place]) 08/12/2018   Chest pain  . Stroke ([place]) 11/19/2016   "fully recovered" (08/14/2018)  . 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 BACK SURGERY    . COLONOSCOPY W/ POLYPECTOMY  2010; 2015   Dr Olevia Perches  . CORONARY ARTERY BYPASS GRAFT N/A 08/19/2018   Procedure: CORONARY ARTERY BYPASS GRAFTING (CABG) x 5  USING LEFT INTERNAL MAMMARY ARTERY AND ENDOSCOPICALLY HARVESTED  RIGHT SAPHENOUS VEIN.;  Surgeon: Ivin Poot, MD;  Location: [place];  Service: Open Heart Surgery;  Laterality: N/A;  . EP IMPLANTABLE DEVICE N/A 11/21/2016   Procedure: Loop Recorder Insertion;  Surgeon: Thompson Grayer, MD;  Location: [facility];  Service: Cardiovascular;  Laterality: N/A;  . ESI  2014   @L5 -S1 X3   . EXCISIONAL HEMORRHOIDECTOMY  ~ 1995  . FACIAL RECONSTRUCTION SURGERY Right 1990s   plating & pinning of maxilla  . INGUINAL HERNIA REPAIR Bilateral 1959  . LEFT HEART CATH AND CORONARY ANGIOGRAPHY N/A 08/14/2018   Procedure: LEFT HEART CATH AND CORONARY ANGIOGRAPHY;  Surgeon: Lorretta Harp, MD;  Location: [place] [facility];  Service: Cardiovascular;  Laterality: N/A;  . POSTERIOR LUMBAR FUSION  10/17/2013   L5-S1 ; [place]  . TEE WITHOUT CARDIOVERSION N/A 11/21/2016   Procedure: TRANSESOPHAGEAL ECHOCARDIOGRAM (TEE);  Surgeon: Larey Dresser, MD;  Location: [place];  Service: Cardiovascular;  Laterality: N/A;  . TEE WITHOUT CARDIOVERSION N/A 08/19/2018   Procedure: TRANSESOPHAGEAL ECHOCARDIOGRAM (TEE);  Surgeon: Prescott Gum, Collier Salina, MD;  Location: [place];  Service: Open Heart Surgery;  Laterality: N/A;    Current Medications: Current Meds  Medication Sig  . amiodarone (PACERONE) 200 MG tablet Take 1 tablet (200 mg total) by mouth daily.  Marland Kitchen aspirin EC 81 MG EC tablet Take 1 tablet (81  mg total) by mouth daily.  . clopidogrel (PLAVIX) 75 MG tablet Take 1 tablet (75 mg total) by mouth daily.  Marland Kitchen glucose blood test strip 1 each by Other route as needed. Use as instructed   . Lancets (FREESTYLE) lancets 1 each by Other route 2 (two) times daily. Use to help check blood sugar twice a day. Dx E11.9  . metFORMIN (GLUCOPHAGE) 1000 MG tablet TAKE 1 TABLET(1000 MG) BY MOUTH TWICE DAILY WITH A MEAL (Patient taking differently: Take 1,000 mg by mouth 2 (two) times daily with a meal. )  . metoprolol tartrate (LOPRESSOR) 25 MG tablet Take 1 tablet (25 mg total) by  mouth 2 (two) times daily.  . Omega-3 Fatty Acids (FISH OIL) 1000 MG CAPS Take 1 capsule (1,000 mg total) by mouth daily.  Marland Kitchen oxyCODONE 10 MG TABS Take 10 mg every 4-6 hours PRN severe pain.  . ramipril (ALTACE) 2.5 MG capsule Take 1 capsule (2.5 mg total) by mouth daily.  . rosuvastatin (CRESTOR) 20 MG tablet Take 1 tablet (20 mg total) by mouth daily at 6 PM.  . zolpidem (AMBIEN) 5 MG tablet TAKE 1 TABLET(5 MG) BY MOUTH AT BEDTIME AS NEEDED  . [DISCONTINUED] amiodarone (PACERONE) 200 MG tablet Take 2 tablets (400 mg total) by mouth 2 (two) times daily. For 5 days;then take Amiodarone 200 mg bid for one week; Then take Amiodarone 200 mg daily thereafter     Allergies:   Oxycodone   Social History   Socioeconomic History  . Marital status: Married    Spouse name: Not on file  . Number of children: Not on file  . Years of education: Not on file  . Highest education level: Not on file  Occupational History  . Not on file  Social Needs  . Financial resource strain: Not on file  . Food insecurity:    Worry: Not on file    Inability: Not on file  . Transportation needs:    Medical: Not on file    Non-medical: Not on file  Tobacco Use  . Smoking status: Former Smoker    Packs/day: 0.25    Years: 6.00    Pack years: 1.50    Types: Cigarettes    Last attempt to quit: 12/17/1980    Years since quitting: 37.7  . Smokeless tobacco: Never Used  . Tobacco comment: smoked off and on 1966- 1982, up to < 5 cigarettes / day  Substance and Sexual Activity  . Alcohol use: Yes    Alcohol/week: 4.0 standard drinks    Types: 4 Shots of liquor per week    Comment:  socially  . Drug use: Never  . Sexual activity: Yes  Lifestyle  . Physical activity:    Days per week: Not on file    Minutes per session: Not on file  . Stress: Not on file  Relationships  . Social connections:    Talks on phone: Not on file    Gets together: Not on file    Attends religious service: Not on file    Active  member of club or organization: Not on file    Attends meetings of clubs or organizations: Not on file    Relationship status: Not on file  Other Topics Concern  . Not on file  Social History Narrative  . Not on file     Family History: The patient's family history includes Diabetes in his sister; Heart attack (age of onset: 15) in his father; Hypertension in  his mother; Stroke in his maternal uncle and mother. There is no history of Cancer, Colon cancer, Rectal cancer, or Stomach cancer.  ROS:   Please see the history of present illness.     All other systems reviewed and are negative.  EKGs/Labs/Other Studies Reviewed:    The following studies were reviewed today:  Left heart cath 08/14/18:  Prox Cx lesion is 75% stenosed.  Mid Cx to Dist Cx lesion is 90% stenosed.  Ost 2nd Mrg lesion is 95% stenosed.  Ost 2nd Mrg to 2nd Mrg lesion is 70% stenosed.  Ost LAD to Prox LAD lesion is 95% stenosed.  Prox LAD lesion is 95% stenosed.  Ost Ramus to Ramus lesion is 90% stenosed.  Ramus lesion is 90% stenosed.  Prox RCA to Mid RCA lesion is 90% stenosed.  Dist RCA lesion is 95% stenosed.  The left ventricular systolic function is normal.  LV end diastolic pressure is normal.  The left ventricular ejection fraction is 55-65% by visual estimate.   IMPRESSION: Mr. 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  He will require coronary artery bypass grafting.  He has been on Plavix which may be problematic with regards to timing.  I am going to get a verify now P2 Y 12 test this morning to assess platelet reactivity.  I have spoken to the cardiothoracic surgeons who will see him today and make a final disposition with regards to timing.  Given the severity of his disease, I favor keeping him in the hospital awaiting his bypass graft surgery.  The sheath was removed and a TR band was placed on the right wrist to achieve patent hemostasis.  The patient left the  lab in stable condition.   Echo 08/14/18: Study Conclusions - Left ventricle: The cavity size was normal. There was moderate   concentric hypertrophy. Systolic function was normal. The   estimated ejection fraction was in the range of 60% to 65%. Wall   motion was normal; there were no regional wall motion   abnormalities. Doppler parameters are consistent with abnormal   left ventricular relaxation (grade 1 diastolic dysfunction).   There was no evidence of elevated ventricular filling pressure by   Doppler parameters. - Aortic valve: There was no regurgitation. - Mitral valve: Calcified annulus. - Right ventricle: The cavity size was normal. Wall thickness was   normal. Systolic function was normal. - Right atrium: The atrium was normal in size. - Tricuspid valve: There was no regurgitation. - Inferior vena cava: The vessel was normal in size. - Pericardium, extracardiac: There was no pericardial effusion.   EKG:  EKG is ordered today.  The ekg ordered today demonstrates NSR  Recent Labs: 08/15/2018: ALT 16 08/20/2018: Magnesium 2.3 08/22/2018: TSH 3.433 08/23/2018: BUN 17; Creatinine, Ser 1.18; Hemoglobin 10.1; Platelets 181; Potassium 3.8; Sodium 138  Recent Lipid Panel    Component Value Date/Time   CHOL 141 05/22/2018 0805   CHOL 153 05/25/2015 0924   TRIG 161.0 (H) 05/22/2018 0805   TRIG 188 (H) 05/25/2015 0924   HDL 39.00 (L) 05/22/2018 0805   HDL 38 (L) 05/25/2015 0924   CHOLHDL 4 05/22/2018 0805   VLDL 32.2 05/22/2018 0805   LDLCALC 70 05/22/2018 0805   LDLCALC 77 05/25/2015 0924   LDLDIRECT 153.3 09/08/2007 1133    Physical Exam:    VS:  BP (!) 128/54   Pulse 70   Ht 6' (1.829 m)   Wt 198 lb 3.2 oz (89.9 kg)  BMI 26.88 kg/m     Wt Readings from Last 3 Encounters:  09/08/18 198 lb 3.2 oz (89.9 kg)  08/23/18 207 lb 8 oz (94.1 kg)  08/12/18 208 lb (94.3 kg)     GEN:  Well nourished, well developed in no acute distress HEENT: Normal NECK: No JVD; No  carotid bruits CARDIAC: RRR, no murmurs, rubs, gallops RESPIRATORY:  Clear to auscultation without rales, wheezing or rhonchi  ABDOMEN: Soft, non-tender, non-distended MUSCULOSKELETAL:  No edema; No deformity  SKIN: Warm and dry NEUROLOGIC:  Alert and oriented x 3 PSYCHIATRIC:  Normal affect   ASSESSMENT:    1. Coronary artery disease involving native coronary artery of native heart with unstable angina pectoris ([place])   2. S/P CABG x 5   3. Progressive angina ([place])   4. Mixed hyperlipidemia   5. Essential hypertension   6. Paroxysmal atrial fibrillation (H[facility])    PLAN:    In order of problems listed above:  Coronary artery disease involving native coronary artery of native heart with unstable angina pectoris ([facility]) S/P CABG x 5 Progressive angina ([place]) - Plan: EKG 12-Lead He thinks he is taking 81 mg of aspirin, Plavix, Lopressor 25 mg twice daily, ramipril, and Crestor.  His blood pressure is well controlled today.  No medication changes. Sternotomy and drain sites C/D/I.   Mixed hyperlipidemia 05/22/2018: Cholesterol 141; HDL 39.00; LDL Cholesterol 70; Triglycerides 161.0; VLDL 32.2 He was not on a statin medication prior to surgery.  Continue 20 mg of Crestor.   Essential hypertension Pressure well controlled today.  No medication changes.   Post-operative Afib Wean amiodarone to 200 mg daily. EKG with NSR today. He also has a loop recorder in place. No anticoagulation.  We will see him back in 3 months.  If loop recorder and EKG both show sinus rhythm, consider discontinuing amiodarone.  LFTs during hospitalization were normal.   Follow-up in 3 months with Dr. Gwenlyn Found.   Medication Adjustments/Labs and Tests Ordered: Current medicines are reviewed at length with the patient today.  Concerns regarding medicines are outlined above.  Orders Placed This Encounter  Procedures  . EKG 12-Lead   Meds ordered this encounter  Medications  . amiodarone (PACERONE) 200 MG tablet      Sig: Take 1 tablet (200 mg total) by mouth daily.    Dispense:  90 tablet    Refill:  3    Signed, Ledora Bottcher, [place]  09/08/2018 8:54 AM    [facility]

## 2018-09-08 ENCOUNTER — Encounter: Payer: Self-pay | Admitting: Physician Assistant

## 2018-09-08 ENCOUNTER — Ambulatory Visit: Payer: PPO | Admitting: Physician Assistant

## 2018-09-08 VITALS — BP 128/54 | HR 70 | Ht 72.0 in | Wt 198.2 lb

## 2018-09-08 DIAGNOSIS — I25110 Atherosclerotic heart disease of native coronary artery with unstable angina pectoris: Secondary | ICD-10-CM | POA: Diagnosis not present

## 2018-09-08 DIAGNOSIS — I200 Unstable angina: Secondary | ICD-10-CM

## 2018-09-08 DIAGNOSIS — Z951 Presence of aortocoronary bypass graft: Secondary | ICD-10-CM

## 2018-09-08 DIAGNOSIS — I480 Paroxysmal atrial fibrillation: Secondary | ICD-10-CM | POA: Diagnosis not present

## 2018-09-08 DIAGNOSIS — E782 Mixed hyperlipidemia: Secondary | ICD-10-CM

## 2018-09-08 DIAGNOSIS — I10 Essential (primary) hypertension: Secondary | ICD-10-CM

## 2018-09-08 DIAGNOSIS — Z8679 Personal history of other diseases of the circulatory system: Secondary | ICD-10-CM | POA: Insufficient documentation

## 2018-09-08 LAB — CUP PACEART REMOTE DEVICE CHECK
Date Time Interrogation Session: 20190817020630
MDC IDC PG IMPLANT DT: 20171206

## 2018-09-08 MED ORDER — AMIODARONE HCL 200 MG PO TABS: 200.0000 mg | ORAL_TABLET | Freq: Every day | ORAL | 3 refills | Status: DC

## 2018-09-08 NOTE — Patient Instructions (Signed)
Medication Instructions:  DECREASE- Amiodarone 200 mg daily  If you need a refill on your cardiac medications before your next appointment, please call your pharmacy.  Labwork: None Ordered   Testing/Procedures: None Ordered  Follow-Up: Your physician wants you to follow-up in: 3 Months with Fabian Sharp.       Thank you for choosing [facility] at [facility]!!

## 2018-09-09 ENCOUNTER — Other Ambulatory Visit: Payer: Self-pay | Admitting: Cardiothoracic Surgery

## 2018-09-09 DIAGNOSIS — Z951 Presence of aortocoronary bypass graft: Secondary | ICD-10-CM

## 2018-09-09 NOTE — Telephone Encounter (Signed)
Attempted to call patient in regards to Cardiac Rehab - LM on VM 

## 2018-09-10 ENCOUNTER — Encounter: Payer: Self-pay | Admitting: Cardiothoracic Surgery

## 2018-09-10 ENCOUNTER — Other Ambulatory Visit: Payer: Self-pay

## 2018-09-10 ENCOUNTER — Ambulatory Visit
Admission: RE | Admit: 2018-09-10 | Discharge: 2018-09-10 | Disposition: A | Discharge status: Home / Self Care | Payer: PPO | Source: Ambulatory Visit | Attending: Cardiothoracic Surgery | Admitting: Cardiothoracic Surgery

## 2018-09-10 ENCOUNTER — Ambulatory Visit (INDEPENDENT_AMBULATORY_CARE_PROVIDER_SITE_OTHER): Payer: Self-pay | Admitting: Cardiothoracic Surgery

## 2018-09-10 VITALS — BP 131/73 | HR 72 | Resp 16 | Ht 72.0 in | Wt 196.0 lb

## 2018-09-10 DIAGNOSIS — I25110 Atherosclerotic heart disease of native coronary artery with unstable angina pectoris: Secondary | ICD-10-CM

## 2018-09-10 DIAGNOSIS — Z951 Presence of aortocoronary bypass graft: Secondary | ICD-10-CM

## 2018-09-10 NOTE — Progress Notes (Signed)
PCP is Burns, Claudina Lick, MD Referring Provider is Lorretta Harp, MD  Chief Complaint  Patient presents with  . Routine Post Op    1 week f/u with CXR s/p CABG x 5...08/19/18    HPI: Routine office visit 3 weeks postop urgent CABG for unstable angina.  Patient had normal LV function.  He had transient postoperative atrial fibrillation and was discharged home on amiodarone.  Patient has done well at home and is walking probably half a mile a day.  His dyspnea on exertion anginal equivalent is improved.  He denies any fever.  His appetite is poor.  His sleeping pattern is improved.  His surgical incisions are healing well.  He denies any significant edema or sternal clicking or popping sensation.  We reviewed the postop cardiac rehab program and he is leaning towards doing a self based rehab program because of his high level of conditioning going into surgery.  Chest x-ray today shows clear lung fields no pleural effusion sternal wires intact.   Past Medical History:  Diagnosis Date  . Coronary artery disease   . Diet-controlled diabetes mellitus ([place])    AODM  . History of gout   . Hyperlipidemia   . Hypertension   . Progressive angina ([place]) 08/12/2018   Chest pain  . Stroke ([place]) 11/19/2016   "fully recovered" (08/14/2018)  . Tick bite [id number]   Erlichiosis;Eulis Canner MD, ID    Past Surgical History:  Procedure Laterality Date  . BACK SURGERY    . COLONOSCOPY W/ POLYPECTOMY  2010; 2015   Dr Olevia Perches  . CORONARY ARTERY BYPASS GRAFT N/A 08/19/2018   Procedure: CORONARY ARTERY BYPASS GRAFTING (CABG) x 5  USING LEFT INTERNAL MAMMARY ARTERY AND ENDOSCOPICALLY HARVESTED RIGHT SAPHENOUS VEIN.;  Surgeon: Ivin Poot, MD;  Location: [facility];  Service: Open Heart Surgery;  Laterality: N/A;  . EP IMPLANTABLE DEVICE N/A 11/21/2016   Procedure: Loop Recorder Insertion;  Surgeon: Thompson Grayer, MD;  Location: [facility];  Service: Cardiovascular;  Laterality: N/A;  . ESI  2014   @L5 -S1 X3    . EXCISIONAL HEMORRHOIDECTOMY  ~ 1995  . FACIAL RECONSTRUCTION SURGERY Right 1990s   plating & pinning of maxilla  . INGUINAL HERNIA REPAIR Bilateral 1959  . LEFT HEART CATH AND CORONARY ANGIOGRAPHY N/A 08/14/2018   Procedure: LEFT HEART CATH AND CORONARY ANGIOGRAPHY;  Surgeon: Lorretta Harp, MD;  Location: [facility];  Service: Cardiovascular;  Laterality: N/A;  . POSTERIOR LUMBAR FUSION  10/17/2013   L5-S1 ; [place]  . TEE WITHOUT CARDIOVERSION N/A 11/21/2016   Procedure: TRANSESOPHAGEAL ECHOCARDIOGRAM (TEE);  Surgeon: Larey Dresser, MD;  Location: [facility];  Service: Cardiovascular;  Laterality: N/A;  . TEE WITHOUT CARDIOVERSION N/A 08/19/2018   Procedure: TRANSESOPHAGEAL ECHOCARDIOGRAM (TEE);  Surgeon: Prescott Gum, Collier [place]ina, MD;  Location: [facility];  Service: Open Heart Surgery;  Laterality: N/A;    Family History  Problem Relation Age of Onset  . Heart attack Father 85  . Hypertension Mother   . Stroke Mother        X21  . Diabetes Sister        "pre Diabetes"  . Stroke Maternal Uncle   . Cancer Neg Hx   . Colon cancer Neg Hx   . Rectal cancer Neg Hx   . Stomach cancer Neg Hx     Social History Social History   Tobacco Use  . Smoking status: Former Smoker  Packs/day: 0.25    Years: 6.00    Pack years: 1.50    Types: Cigarettes    Last attempt to quit: 12/17/1980    Years since quitting: 37.7  . Smokeless tobacco: Never Used  . Tobacco comment: smoked off and on 1966- 1982, up to < 5 cigarettes / day  Substance Use Topics  . Alcohol use: Yes    Alcohol/week: 4.0 standard drinks    Types: 4 Shots of liquor per week    Comment:  socially  . Drug use: Never    Current Outpatient Medications  Medication Sig Dispense Refill  . amiodarone (PACERONE) 200 MG tablet Take 1 tablet (200 mg total) by mouth daily. 90 tablet 3  . aspirin EC 81 MG EC tablet Take 1 tablet (81 mg total) by mouth daily.    . clopidogrel (PLAVIX) 75 MG tablet Take 1 tablet  (75 mg total) by mouth daily. 30 tablet 1  . glucose blood test strip 1 each by Other route as needed. Use as instructed     . Lancets (FREESTYLE) lancets 1 each by Other route 2 (two) times daily. Use to help check blood sugar twice a day. Dx E11.9 100 each 3  . metFORMIN (GLUCOPHAGE) 1000 MG tablet TAKE 1 TABLET(1000 MG) BY MOUTH TWICE DAILY WITH A MEAL (Patient taking differently: Take 1,000 mg by mouth 2 (two) times daily with a meal. ) 180 tablet 1  . metoprolol tartrate (LOPRESSOR) 25 MG tablet Take 1 tablet (25 mg total) by mouth 2 (two) times daily. 60 tablet 1  . Omega-3 Fatty Acids (FISH OIL) 1000 MG CAPS Take 1 capsule (1,000 mg total) by mouth daily.  0  . oxyCODONE 10 MG TABS Take 10 mg every 4-6 hours PRN severe pain. 30 tablet 0  . ramipril (ALTACE) 2.5 MG capsule Take 1 capsule (2.5 mg total) by mouth daily. 30 capsule 1  . rosuvastatin (CRESTOR) 20 MG tablet Take 1 tablet (20 mg total) by mouth daily at 6 PM. 30 tablet 1  . zolpidem (AMBIEN) 5 MG tablet TAKE 1 TABLET(5 MG) BY MOUTH AT BEDTIME AS NEEDED 30 tablet 5   No current facility-administered medications for this visit.     Allergies  Allergen Reactions  . Oxycodone Anxiety    [name], confused, and constipation    Review of Systems  His weight is down 10 to 12 pounds. No other complaints  BP 131/73 (BP Location: Left Arm, Patient Position: Sitting, Cuff Size: Large)   Pulse 72   Resp 16   Ht 6' (1.829 m)   Wt 196 lb (88.9 kg)   SpO2 95% Comment: RA  BMI 26.58 kg/m  Physical Exam      Exam    General- alert and comfortable    Neck- no JVD, no cervical adenopathy palpable, no carotid bruit   Lungs- clear without rales, wheezes   Cor- regular rate and rhythm, no murmur , gallop   Abdomen- soft, non-tender   Extremities - warm, non-tender, minimal edema   Neuro- oriented, appropriate, no focal weakness   Diagnostic Tests: Chest x-ray clear  Impression: Patient doing well 3 weeks after urgent CABG  x5 We discussed his activities-- he can now  drive and lift up to 15-20 pounds maximum.  No treadmill or weightlifting at the gym yet  Medications- he will increase his Altace to his preop dose of 5 mg daily.  He will complete his current supply of amiodarone, 200 mg a day and  then discontinue the drug since he has not had any recurrent A. Fib and his risk of recurrent A. fib postop is now minimal.  Plan: Patient will return in approximate 3 weeks for review of progress.  He understands the limitations in activities to include no lifting more than 20 pounds and he understands the change of medications.   Len Childs, MD [facility] [phone]

## 2018-09-15 LAB — CUP PACEART REMOTE DEVICE CHECK
Implantable Pulse Generator Implant Date: 20171206
MDC IDC SESS DTM: 20190919023802

## 2018-09-16 ENCOUNTER — Telehealth (HOSPITAL_COMMUNITY): Payer: Self-pay

## 2018-09-16 NOTE — Telephone Encounter (Signed)
Called and spoke with patient in regards to Cardiac Rehab - Patient stated he has decided to go in a different direction. Closed referral.

## 2018-09-17 ENCOUNTER — Ambulatory Visit: Payer: PPO | Admitting: Cardiothoracic Surgery

## 2018-10-01 ENCOUNTER — Encounter: Payer: Self-pay | Admitting: Cardiothoracic Surgery

## 2018-10-01 ENCOUNTER — Ambulatory Visit (INDEPENDENT_AMBULATORY_CARE_PROVIDER_SITE_OTHER): Payer: Self-pay | Admitting: Cardiothoracic Surgery

## 2018-10-01 VITALS — BP 174/77 | Ht 72.0 in | Wt 205.0 lb

## 2018-10-01 DIAGNOSIS — I25110 Atherosclerotic heart disease of native coronary artery with unstable angina pectoris: Secondary | ICD-10-CM

## 2018-10-01 DIAGNOSIS — Z951 Presence of aortocoronary bypass graft: Secondary | ICD-10-CM

## 2018-10-01 NOTE — Progress Notes (Signed)
PCP is Burns, Claudina Lick, MD Referring Provider is Lorretta Harp, MD  Chief Complaint  Patient presents with  . Routine Post Op    3 week f/u, HX of CABG, med eval    HPI: Scheduled postop visit after urgent CABG x5.  The patient continues to do well.  He is using a treadmill 45 minutes almost daily.  He feels with improved energy and denies shortness of breath or chest pain.  Surgical incisions are healing well no peripheral edema.  He has lost approximately 15 pounds since surgery but his appetite is now improving. Blood pressure was repeated during the exam today with a reading of 140/80.  He will continue his Altace 5 mg daily  Past Medical History:  Diagnosis Date  . Coronary artery disease   . Diet-controlled diabetes mellitus ([place])    AODM  . History of gout   . Hyperlipidemia   . Hypertension   . Progressive angina ([place]) 08/12/2018   Chest pain  . Stroke ([place]) 11/19/2016   "fully recovered" (08/14/2018)  . Tick bite [id number]   Erlichiosis;Eulis Canner MD, ID    Past Surgical History:  Procedure Laterality Date  . BACK SURGERY    . COLONOSCOPY W/ POLYPECTOMY  2010; 2015   Dr Olevia Perches  . CORONARY ARTERY BYPASS GRAFT N/A 08/19/2018   Procedure: CORONARY ARTERY BYPASS GRAFTING (CABG) x 5  USING LEFT INTERNAL MAMMARY ARTERY AND ENDOSCOPICALLY HARVESTED RIGHT SAPHENOUS VEIN.;  Surgeon: Ivin Poot, MD;  Location: [facility];  Service: Open Heart Surgery;  Laterality: N/A;  . EP IMPLANTABLE DEVICE N/A 11/21/2016   Procedure: Loop Recorder Insertion;  Surgeon: Thompson Grayer, MD;  Location: [facility];  Service: Cardiovascular;  Laterality: N/A;  . ESI  2014   @L5 -S1 X3   . EXCISIONAL HEMORRHOIDECTOMY  ~ 1995  . FACIAL RECONSTRUCTION SURGERY Right 1990s   plating & pinning of maxilla  . INGUINAL HERNIA REPAIR Bilateral 1959  . LEFT HEART CATH AND CORONARY ANGIOGRAPHY N/A 08/14/2018   Procedure: LEFT HEART CATH AND CORONARY ANGIOGRAPHY;  Surgeon: Lorretta Harp, MD;  Location: [facility];  Service: Cardiovascular;  Laterality: N/A;  . POSTERIOR LUMBAR FUSION  10/17/2013   L5-S1 ; [place]  . TEE WITHOUT CARDIOVERSION N/A 11/21/2016   Procedure: TRANSESOPHAGEAL ECHOCARDIOGRAM (TEE);  Surgeon: Larey Dresser, MD;  Location: [facility];  Service: Cardiovascular;  Laterality: N/A;  . TEE WITHOUT CARDIOVERSION N/A 08/19/2018   Procedure: TRANSESOPHAGEAL ECHOCARDIOGRAM (TEE);  Surgeon: Prescott Gum, Collier Salina, MD;  Location: [facility];  Service: Open Heart Surgery;  Laterality: N/A;    Family History  Problem Relation Age of Onset  . Heart attack Father 90  . Hypertension Mother   . Stroke Mother        X63  . Diabetes Sister        "pre Diabetes"  . Stroke Maternal Uncle   . Cancer Neg Hx   . Colon cancer Neg Hx   . Rectal cancer Neg Hx   . Stomach cancer Neg Hx     Social History Social History   Tobacco Use  . Smoking status: Former Smoker    Packs/day: 0.25    Years: 6.00    Pack years: 1.50    Types: Cigarettes    Last attempt to quit: 12/17/1980    Years since quitting: 37.8  . Smokeless tobacco: Never Used  . Tobacco comment: smoked off and on 1966- 1982, up to <  5 cigarettes / day  Substance Use Topics  . Alcohol use: Yes    Alcohol/week: 4.0 standard drinks    Types: 4 Shots of liquor per week    Comment:  socially  . Drug use: Never    Current Outpatient Medications  Medication Sig Dispense Refill  . aspirin EC 81 MG EC tablet Take 1 tablet (81 mg total) by mouth daily.    . clopidogrel (PLAVIX) 75 MG tablet Take 1 tablet (75 mg total) by mouth daily. 30 tablet 1  . glucose blood test strip 1 each by Other route as needed. Use as instructed     . Lancets (FREESTYLE) lancets 1 each by Other route 2 (two) times daily. Use to help check blood sugar twice a day. Dx E11.9 100 each 3  . metFORMIN (GLUCOPHAGE) 1000 MG tablet TAKE 1 TABLET(1000 MG) BY MOUTH TWICE DAILY WITH A MEAL (Patient taking differently: Take 1,000 mg by mouth 2 (two)  times daily with a meal. ) 180 tablet 1  . metoprolol tartrate (LOPRESSOR) 25 MG tablet Take 1 tablet (25 mg total) by mouth 2 (two) times daily. 60 tablet 1  . Omega-3 Fatty Acids (FISH OIL) 1000 MG CAPS Take 1 capsule (1,000 mg total) by mouth daily.  0  . oxyCODONE 10 MG TABS Take 10 mg every 4-6 hours PRN severe pain. 30 tablet 0  . ramipril (ALTACE) 2.5 MG capsule Take 1 capsule (2.5 mg total) by mouth daily. 30 capsule 1  . rosuvastatin (CRESTOR) 20 MG tablet Take 1 tablet (20 mg total) by mouth daily at 6 PM. 30 tablet 1  . zolpidem (AMBIEN) 5 MG tablet TAKE 1 TABLET(5 MG) BY MOUTH AT BEDTIME AS NEEDED 30 tablet 5   No current facility-administered medications for this visit.     Allergies  Allergen Reactions  . Oxycodone Anxiety    [name], confused, and constipation    Review of Systems  Continues to feel better weekly  BP (!) 174/77   Ht 6' (1.829 m)   Wt 205 lb (93 kg)   BMI 27.80 kg/m  Physical Exam      Exam    General- alert and comfortable    Neck- no JVD, no cervical adenopathy palpable, no carotid bruit   Lungs- clear without rales, wheezes   Cor- regular rate and rhythm, no murmur , gallop   Abdomen- soft, non-tender   Extremities - warm, non-tender, minimal edema   Neuro- oriented, appropriate, no focal weakness   Diagnostic Tests: Chest x-ray last visit clear sternal wires intact  Impression: Patient doing very well.  We discussed limitations to activity including 20 pound weight limit until 3 months after surgery.  Plan: Continue current medications and activity level and healthy diet.  Return in 1 month for review of progress.   Len Childs, MD [facility] [phone]

## 2018-10-06 ENCOUNTER — Ambulatory Visit (INDEPENDENT_AMBULATORY_CARE_PROVIDER_SITE_OTHER): Payer: PPO | Admitting: *Deleted

## 2018-10-06 DIAGNOSIS — I639 Cerebral infarction, unspecified: Secondary | ICD-10-CM | POA: Diagnosis not present

## 2018-10-07 NOTE — Progress Notes (Signed)
Carelink Summary Report / Loop Recorder 

## 2018-10-10 DIAGNOSIS — H35033 Hypertensive retinopathy, bilateral: Secondary | ICD-10-CM | POA: Diagnosis not present

## 2018-10-10 DIAGNOSIS — E119 Type 2 diabetes mellitus without complications: Secondary | ICD-10-CM | POA: Diagnosis not present

## 2018-10-10 DIAGNOSIS — D3132 Benign neoplasm of left choroid: Secondary | ICD-10-CM | POA: Diagnosis not present

## 2018-10-10 DIAGNOSIS — H25813 Combined forms of age-related cataract, bilateral: Secondary | ICD-10-CM | POA: Diagnosis not present

## 2018-10-14 ENCOUNTER — Other Ambulatory Visit: Payer: Self-pay | Admitting: Physician Assistant

## 2018-10-14 ENCOUNTER — Other Ambulatory Visit: Payer: Self-pay | Admitting: Cardiothoracic Surgery

## 2018-10-16 ENCOUNTER — Other Ambulatory Visit: Payer: Self-pay | Admitting: Cardiovascular Disease

## 2018-10-16 ENCOUNTER — Other Ambulatory Visit: Payer: Self-pay | Admitting: Cardiology

## 2018-10-27 ENCOUNTER — Telehealth: Payer: Self-pay | Admitting: Cardiology

## 2018-10-27 NOTE — Telephone Encounter (Signed)
Spoke w/ pt and requested that he send a manual transmission b/c his home monitor has not updated in at least 14 days.   

## 2018-10-29 LAB — CUP PACEART REMOTE DEVICE CHECK
Date Time Interrogation Session: 20191022033934
Implantable Pulse Generator Implant Date: 20171206

## 2018-11-05 ENCOUNTER — Ambulatory Visit: Payer: PPO | Admitting: Cardiothoracic Surgery

## 2018-11-10 ENCOUNTER — Ambulatory Visit (INDEPENDENT_AMBULATORY_CARE_PROVIDER_SITE_OTHER): Payer: PPO

## 2018-11-10 DIAGNOSIS — I639 Cerebral infarction, unspecified: Secondary | ICD-10-CM

## 2018-11-10 NOTE — Progress Notes (Signed)
Carelink Summary Report / Loop Recorder 

## 2018-11-11 ENCOUNTER — Other Ambulatory Visit: Payer: Self-pay | Admitting: Cardiology

## 2018-11-11 NOTE — Telephone Encounter (Signed)
Rx request sent to pharmacy.  

## 2018-11-26 ENCOUNTER — Ambulatory Visit: Payer: PPO | Admitting: Cardiothoracic Surgery

## 2018-11-26 ENCOUNTER — Other Ambulatory Visit: Payer: Self-pay

## 2018-11-26 ENCOUNTER — Encounter: Payer: Self-pay | Admitting: Cardiothoracic Surgery

## 2018-11-26 VITALS — BP 143/86 | HR 65 | Resp 16 | Ht 72.0 in | Wt 205.0 lb

## 2018-11-26 DIAGNOSIS — Z951 Presence of aortocoronary bypass graft: Secondary | ICD-10-CM

## 2018-11-26 DIAGNOSIS — I25110 Atherosclerotic heart disease of native coronary artery with unstable angina pectoris: Secondary | ICD-10-CM

## 2018-11-26 NOTE — Progress Notes (Signed)
PCP is Burns, Claudina Lick, MD Referring Provider is Lorretta Harp, MD  Chief Complaint  Patient presents with  . Routine Post Op    1 month f/u s/p CABG X 5 .Marland KitchenMarland Kitchen9/3/19    HPI: 46-month follow-up after urgent CABG x5 for unstable angina  Patient doing well walking over 3 miles.  Gaining strength.  Weight is stable at 205.  Sternal and leg incisions are healed.  Last chest x-ray is clear.  No recurrent angina.  No palpitations, edema, shortness of breath.  At 3 months the patients sternal precautions are lifted and he can now resume normal activities including yardwork golf and exercising.  He will continue his current medications including Plavix for history of TIA, 81 mg aspirin, Crestor, and metoprolol and Altace.  Past Medical History:  Diagnosis Date  . Coronary artery disease   . Diet-controlled diabetes mellitus ([place])    AODM  . History of gout   . Hyperlipidemia   . Hypertension   . Progressive angina ([place]) 08/12/2018   Chest pain  . Stroke ([place]) 11/19/2016   "fully recovered" (08/14/2018)  . Tick bite [phone]   Erlichiosis;Eulis Canner MD, ID    Past Surgical History:  Procedure Laterality Date  . BACK SURGERY    . COLONOSCOPY W/ POLYPECTOMY  2010; 2015   Dr Olevia Perches  . CORONARY ARTERY BYPASS GRAFT N/A 08/19/2018   Procedure: CORONARY ARTERY BYPASS GRAFTING (CABG) x 5  USING LEFT INTERNAL MAMMARY ARTERY AND ENDOSCOPICALLY HARVESTED RIGHT SAPHENOUS VEIN.;  Surgeon: Ivin Poot, MD;  Location: [facility];  Service: Open Heart Surgery;  Laterality: N/A;  . EP IMPLANTABLE DEVICE N/A 11/21/2016   Procedure: Loop Recorder Insertion;  Surgeon: Thompson Grayer, MD;  Location: [facility];  Service: Cardiovascular;  Laterality: N/A;  . ESI  2014   @L5 -S1 X3   . EXCISIONAL HEMORRHOIDECTOMY  ~ 1995  . FACIAL RECONSTRUCTION SURGERY Right 1990s   plating & pinning of maxilla  . INGUINAL HERNIA REPAIR Bilateral 1959  . LEFT HEART CATH AND CORONARY ANGIOGRAPHY N/A 08/14/2018   Procedure:  LEFT HEART CATH AND CORONARY ANGIOGRAPHY;  Surgeon: Lorretta Harp, MD;  Location: [facility];  Service: Cardiovascular;  Laterality: N/A;  . POSTERIOR LUMBAR FUSION  10/17/2013   L5-S1 ; [place]  . TEE WITHOUT CARDIOVERSION N/A 11/21/2016   Procedure: TRANSESOPHAGEAL ECHOCARDIOGRAM (TEE);  Surgeon: Larey Dresser, MD;  Location: [facility];  Service: Cardiovascular;  Laterality: N/A;  . TEE WITHOUT CARDIOVERSION N/A 08/19/2018   Procedure: TRANSESOPHAGEAL ECHOCARDIOGRAM (TEE);  Surgeon: Prescott Gum, Collier Salina, MD;  Location: [facility];  Service: Open Heart Surgery;  Laterality: N/A;    Family History  Problem Relation Age of Onset  . Heart attack Father 26  . Hypertension Mother   . Stroke Mother        X74  . Diabetes Sister        "pre Diabetes"  . Stroke Maternal Uncle   . Cancer Neg Hx   . Colon cancer Neg Hx   . Rectal cancer Neg Hx   . Stomach cancer Neg Hx     Social History Social History   Tobacco Use  . Smoking status: Former Smoker    Packs/day: 0.25    Years: 6.00    Pack years: 1.50    Types: Cigarettes    Last attempt to quit: 12/17/1980    Years since quitting: 37.9  . Smokeless tobacco: Never Used  . Tobacco  comment: smoked off and on 1966- 1982, up to < 5 cigarettes / day  Substance Use Topics  . Alcohol use: Yes    Alcohol/week: 4.0 standard drinks    Types: 4 Shots of liquor per week    Comment:  socially  . Drug use: Never    Current Outpatient Medications  Medication Sig Dispense Refill  . aspirin EC 81 MG EC tablet Take 1 tablet (81 mg total) by mouth daily.    . clopidogrel (PLAVIX) 75 MG tablet Take 1 tablet (75 mg total) by mouth daily. 30 tablet 1  . glucose blood test strip 1 each by Other route as needed. Use as instructed     . Lancets (FREESTYLE) lancets 1 each by Other route 2 (two) times daily. Use to help check blood sugar twice a day. Dx E11.9 100 each 3  . metFORMIN (GLUCOPHAGE) 1000 MG tablet TAKE 1 TABLET(1000 MG) BY  MOUTH TWICE DAILY WITH A MEAL (Patient taking differently: Take 1,000 mg by mouth 2 (two) times daily with a meal. ) 180 tablet 1  . metoprolol tartrate (LOPRESSOR) 25 MG tablet Take 1 tablet (25 mg total) by mouth 2 (two) times daily. 60 tablet 1  . Omega-3 Fatty Acids (FISH OIL) 1000 MG CAPS Take 1 capsule (1,000 mg total) by mouth daily.  0  . oxyCODONE 10 MG TABS Take 10 mg every 4-6 hours PRN severe pain. 30 tablet 0  . ramipril (ALTACE) 2.5 MG capsule Take 1 capsule (2.5 mg total) by mouth daily. 30 capsule 1  . rosuvastatin (CRESTOR) 20 MG tablet TAKE 1 TABLET BY MOUTH EVERY DAY AT 6 PM 90 tablet 0  . zolpidem (AMBIEN) 5 MG tablet TAKE 1 TABLET(5 MG) BY MOUTH AT BEDTIME AS NEEDED 30 tablet 5   No current facility-administered medications for this visit.     Allergies  Allergen Reactions  . Oxycodone Anxiety    [name], confused, and constipation    Review of Systems  Good appetite Sleeping pattern improved No surgical pain  BP (!) 143/86 (BP Location: Right Arm, Patient Position: Sitting, Cuff Size: Large)   Pulse 65   Resp 16   Ht 6' (1.829 m)   Wt 205 lb (93 kg)   SpO2 98% Comment: ON RA  BMI 27.80 kg/m  Physical Exam      Exam    General- alert and comfortable    Neck- no JVD, no cervical adenopathy palpable, no carotid bruit   Lungs- clear without rales, wheezes.  Sternal incision well-healed.   Cor- regular rate and rhythm, no murmur , gallop   Abdomen- soft, non-tender   Extremities - warm, non-tender, minimal edema   Neuro- oriented, appropriate, no focal weakness   Diagnostic Tests: None  Impression: Excellent 20-month recovery after urgent CABG x5  Plan: Sternal restrictions are now lifted and patient can resume normal activities.  Importance of heart healthy diet and heart healthy lifestyle reviewed with patient. He will be referred for postop follow-up to his cardiologist, Dr. Quay Burow and I will see the patient back in about 3 months for review  of progress.   Len Childs, MD [facility] [phone]

## 2018-12-04 ENCOUNTER — Other Ambulatory Visit: Payer: Self-pay | Admitting: Physician Assistant

## 2018-12-07 ENCOUNTER — Other Ambulatory Visit: Payer: Self-pay | Admitting: Cardiovascular Disease

## 2018-12-11 ENCOUNTER — Ambulatory Visit: Payer: PPO

## 2018-12-12 LAB — CUP PACEART REMOTE DEVICE CHECK
Date Time Interrogation Session: 20191227043818
Implantable Pulse Generator Implant Date: 20171206

## 2018-12-18 ENCOUNTER — Ambulatory Visit: Payer: PPO | Admitting: Cardiovascular Disease

## 2018-12-18 ENCOUNTER — Other Ambulatory Visit: Payer: Self-pay | Admitting: Internal Medicine

## 2018-12-18 ENCOUNTER — Encounter: Payer: Self-pay | Admitting: Cardiovascular Disease

## 2018-12-18 DIAGNOSIS — E782 Mixed hyperlipidemia: Secondary | ICD-10-CM | POA: Diagnosis not present

## 2018-12-18 DIAGNOSIS — Z951 Presence of aortocoronary bypass graft: Secondary | ICD-10-CM

## 2018-12-18 DIAGNOSIS — I10 Essential (primary) hypertension: Secondary | ICD-10-CM

## 2018-12-18 LAB — HEPATIC FUNCTION PANEL
ALT: 15 IU/L (ref 0–44)
AST: 17 IU/L (ref 0–40)
Albumin: 4.2 g/dL (ref 3.6–4.8)
Alkaline Phosphatase: 79 IU/L (ref 39–117)
BILIRUBIN, DIRECT: 0.13 mg/dL (ref 0.00–0.40)
Bilirubin Total: 0.4 mg/dL (ref 0.0–1.2)
Total Protein: 6.6 g/dL (ref 6.0–8.5)

## 2018-12-18 LAB — LIPID PANEL
CHOLESTEROL TOTAL: 130 mg/dL (ref 100–199)
Chol/HDL Ratio: 2.5 ratio (ref 0.0–5.0)
HDL: 52 mg/dL (ref >39)
LDL Calculated: 54 mg/dL (ref 0–99)
Triglycerides: 120 mg/dL (ref 0–149)
VLDL Cholesterol Cal: 24 mg/dL (ref 5–40)

## 2018-12-18 NOTE — Assessment & Plan Note (Signed)
History of hyperlipidemia on Crestor with lipid profile performed 05/22/2018 revealing a total cholesterol of 141, LDL 70 and HDL of 39.  I am going to check a lipid and liver profile today.

## 2018-12-18 NOTE — Assessment & Plan Note (Signed)
History of CAD status post cardiac catheterization performed by myself 08/14/2018 revealing three-vessel disease with preserved LV function.  He underwent coronary artery bypass grafting x5 on 08/19/2018 after Plavix washout with a LIMA to the LAD, sequential vein graft OM1 and to, vein graft to the ramus branch and to the PDA.  His postop course was uncomplicated.  He did have some postop PAF which resolved with amiodarone which has since been discontinued.  He has seen Dr. Darcey Nora several weeks ago who released him to normal activity.

## 2018-12-18 NOTE — Assessment & Plan Note (Signed)
History of essential hypertension blood pressure measured today 144/78.  He is on metoprolol and ramipril.

## 2018-12-18 NOTE — Progress Notes (Signed)
12/18/2018 Alex Bryant   1951-07-27  [id number]  Primary Physician Alex Rail, MD Primary Cardiologist: Alex Harp MD Alex Bryant, [place]  HPI:  Alex Bryant is a 68 y.o.  fit appearing married Caucasian male father of 20, grandfather for grandchildren who works as a Museum/gallery curator here in town.  He was self-referred for new onset exertional chest pain and dyspnea.  His primary care provider is Alex Bryant.  I last saw him in the office 08/12/2018.  His risk factors include treated diabetes, hypertension hyperlipidemia.  He does have a family history of heart disease with a father who had a myocardial infarction and bypass surgery at age 68.  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  He had a loop recorder implanted by Alex Bryant December 2017 without evidence of A. fib.  He remains on clopidogrel.  Approximately 2 weeks ago he noticed some shortness of breath with activity.  He played golf last week in [place] and noticed when walking up an incline increasing dyspnea and substernal chest pain.  This happened as recently as last night when taking out the trash.  Because of his symptoms I performed outpatient radial diagnostic cath on 08/14/2018 revealing three-vessel disease with preserved LV function.  He had surgical anatomy.  He underwent coronary artery bypass grafting x5 by Alex Bryant on 08/19/2018 with a LIMA to the LAD, vein to the first and second obtuse marginal branch sequentially, to the ramus branch and to the PDA.  His postop course was only complicated by brief PAF which converted with amiodarone.  He is been in sinus rhythm since then this has been discontinued.  He has recuperated nicely.  He denies chest pain or shortness of breath.  He saw Alex Bryant on 11/26/2018 who released him to normal activity.   Current Meds  Medication Sig  . aspirin EC 81 MG EC tablet Take 1 tablet (81 mg total) by mouth daily.  .  clopidogrel (PLAVIX) 75 MG tablet Take 1 tablet (75 mg total) by mouth daily.  Marland Kitchen glucose blood test strip 1 each by Other route as needed. Use as instructed   . Lancets (FREESTYLE) lancets 1 each by Other route 2 (two) times daily. Use to help check blood sugar twice a day. Dx E11.9  . metFORMIN (GLUCOPHAGE) 1000 MG tablet TAKE 1 TABLET(1000 MG) BY MOUTH TWICE DAILY WITH A MEAL (Patient taking differently: Take 1,000 mg by mouth daily with breakfast. )  . metoprolol tartrate (LOPRESSOR) 25 MG tablet TAKE 1 TABLET BY MOUTH TWICE DAILY (Patient taking differently: Take 25 mg by mouth daily. (BETA BLOCKER))  . Omega-3 Fatty Acids (FISH OIL) 1000 MG CAPS Take 1 capsule (1,000 mg total) by mouth daily.  . ramipril (ALTACE) 2.5 MG capsule Take 1 capsule (2.5 mg total) by mouth daily.  . rosuvastatin (CRESTOR) 20 MG tablet TAKE 1 TABLET BY MOUTH EVERY DAY AT 6 PM  . zolpidem (AMBIEN) 5 MG tablet TAKE 1 TABLET(5 MG) BY MOUTH AT BEDTIME AS NEEDED     Allergies  Allergen Reactions  . Oxycodone Anxiety    [name], confused, and constipation    Social History   Socioeconomic History  . Marital status: Married    Spouse name: Not on file  . Number of children: Not on file  . Years of education: Not on file  . Highest education level: Not on  file  Occupational History  . Not on file  Social Needs  . Financial resource strain: Not on file  . Food insecurity:    Worry: Not on file    Inability: Not on file  . Transportation needs:    Medical: Not on file    Non-medical: Not on file  Tobacco Use  . Smoking status: Former Smoker    Packs/day: 0.25    Years: 6.00    Pack years: 1.50    Types: Cigarettes    Last attempt to quit: 12/17/1980    Years since quitting: 38.0  . Smokeless tobacco: Never Used  . Tobacco comment: smoked off and on 1966- 1982, up to < 5 cigarettes / day  Substance and Sexual Activity  . Alcohol use: Yes    Alcohol/week: 4.0 standard drinks    Types: 4 Shots of liquor  per week    Comment:  socially  . Drug use: Never  . Sexual activity: Yes  Lifestyle  . Physical activity:    Days per week: Not on file    Minutes per session: Not on file  . Stress: Not on file  Relationships  . Social connections:    Talks on phone: Not on file    Gets together: Not on file    Attends religious service: Not on file    Active member of club or organization: Not on file    Attends meetings of clubs or organizations: Not on file    Relationship status: Not on file  . Intimate partner violence:    Fear of current or ex partner: Not on file    Emotionally abused: Not on file    Physically abused: Not on file    Forced sexual activity: Not on file  Other Topics Concern  . Not on file  Social History Narrative  . Not on file     Review of Systems: General: negative for chills, fever, night sweats or weight changes.  Cardiovascular: negative for chest pain, dyspnea on exertion, edema, orthopnea, palpitations, paroxysmal nocturnal dyspnea or shortness of breath Dermatological: negative for rash Respiratory: negative for cough or wheezing Urologic: negative for hematuria Abdominal: negative for nausea, vomiting, diarrhea, bright red blood per rectum, melena, or hematemesis Neurologic: negative for visual changes, syncope, or dizziness All other systems reviewed and are otherwise negative except as noted above.    Blood pressure (!) 144/78, pulse 61, height 6' (1.829 m), weight 212 lb (96.2 kg).  General appearance: alert and no distress Neck: no adenopathy, no carotid bruit, no JVD, supple, symmetrical, trachea midline and thyroid not enlarged, symmetric, no tenderness/mass/nodules Lungs: clear to auscultation bilaterally Heart: regular rate and rhythm, S1, S2 normal, no murmur, click, rub or gallop Extremities: extremities normal, atraumatic, no cyanosis or edema Pulses: 2+ and symmetric Skin: Skin color, texture, turgor normal. No rashes or  lesions Neurologic: Alert and oriented X 3, normal strength and tone. Normal symmetric reflexes. Normal coordination and gait  EKG not performed today  ASSESSMENT AND PLAN:   Mixed hyperlipidemia History of hyperlipidemia on Crestor with lipid profile performed 05/22/2018 revealing a total cholesterol of 141, LDL 70 and HDL of 39.  I am going to check a lipid and liver profile today.  Essential hypertension History of essential hypertension blood pressure measured today 144/78.  He is on metoprolol and ramipril.  S/P CABG x 5 History of CAD status post cardiac catheterization performed by myself 08/14/2018 revealing three-vessel disease with preserved LV function.  He  underwent coronary artery bypass grafting x5 on 08/19/2018 after Plavix washout with a LIMA to the LAD, sequential vein graft OM1 and to, vein graft to the ramus branch and to the PDA.  His postop course was uncomplicated.  He did have some postop PAF which resolved with amiodarone which has since been discontinued.  He has seen Alex Bryant several weeks ago who released him to normal activity.      Alex Harp MD FACP,FACC,FAHA, [facility] 12/18/2018 8:57 AM

## 2018-12-18 NOTE — Patient Instructions (Signed)
Medication Instructions:  Your physician recommends that you continue on your current medications as directed. Please refer to the Current Medication list given to you today.  If you need a refill on your cardiac medications before your next appointment, please call your pharmacy.   Lab work: Your physician recommends that you have lab work today: lipid/liver  If you have labs (blood work) drawn today and your tests are completely normal, you will receive your results only by: Marland Kitchen MyChart Message (if you have MyChart) OR . A paper copy in the mail If you have any lab test that is abnormal or we need to change your treatment, we will call you to review the results.  Testing/Procedures: NONE  Follow-Up: At [facility], you and your health needs are our priority.  As part of our continuing mission to provide you with exceptional heart care, we have created designated Provider Care Teams.  These Care Teams include your primary Cardiologist (physician) and Advanced Practice Providers (APPs -  Physician Assistants and Nurse Practitioners) who all work together to provide you with the care you need, when you need it. You will need a follow up appointment in 6 months.  Please call our office 2 months in advance to schedule this appointment.  You may see Quay Burow, MD or one of the following Advanced Practice Providers on your designated Care Team:   Kerin Ransom, PA-C Roby Lofts, [place] . Sande Rives, PA-C

## 2018-12-18 NOTE — Telephone Encounter (Signed)
[place] Controlled Database Checked Last filled: 11/17/18 # 30 LOV w/you: 05/22/18 Next appt w/you: None

## 2018-12-28 LAB — CUP PACEART REMOTE DEVICE CHECK
Implantable Pulse Generator Implant Date: 20171206
MDC IDC SESS DTM: 20191124033636

## 2019-01-14 ENCOUNTER — Ambulatory Visit (INDEPENDENT_AMBULATORY_CARE_PROVIDER_SITE_OTHER): Payer: PPO

## 2019-01-14 DIAGNOSIS — I639 Cerebral infarction, unspecified: Secondary | ICD-10-CM | POA: Diagnosis not present

## 2019-01-15 NOTE — Progress Notes (Signed)
Carelink Summary Report / Loop Recorder 

## 2019-01-16 LAB — CUP PACEART REMOTE DEVICE CHECK
Date Time Interrogation Session: 20200129094201
Implantable Pulse Generator Implant Date: 20171206

## 2019-01-19 ENCOUNTER — Other Ambulatory Visit: Payer: Self-pay | Admitting: Internal Medicine

## 2019-01-19 NOTE — Telephone Encounter (Signed)
Last refill was 12/18/18 Last OV was 05/22/18 Next OV is not on file

## 2019-01-30 ENCOUNTER — Telehealth: Payer: Self-pay

## 2019-01-30 NOTE — Telephone Encounter (Signed)
Spoke with patient regarding disconnected monitor 

## 2019-02-16 ENCOUNTER — Ambulatory Visit (INDEPENDENT_AMBULATORY_CARE_PROVIDER_SITE_OTHER): Payer: PPO | Admitting: *Deleted

## 2019-02-16 DIAGNOSIS — I639 Cerebral infarction, unspecified: Secondary | ICD-10-CM | POA: Diagnosis not present

## 2019-02-16 LAB — CUP PACEART REMOTE DEVICE CHECK
Date Time Interrogation Session: 20200302113112
Implantable Pulse Generator Implant Date: 20171206

## 2019-02-24 NOTE — Progress Notes (Signed)
Carelink Summary Report / Loop Recorder 

## 2019-03-16 ENCOUNTER — Other Ambulatory Visit: Payer: Self-pay | Admitting: Internal Medicine

## 2019-03-16 ENCOUNTER — Other Ambulatory Visit: Payer: Self-pay | Admitting: Cardiology

## 2019-03-23 ENCOUNTER — Other Ambulatory Visit: Payer: Self-pay

## 2019-03-23 ENCOUNTER — Ambulatory Visit (INDEPENDENT_AMBULATORY_CARE_PROVIDER_SITE_OTHER): Payer: PPO | Admitting: *Deleted

## 2019-03-23 DIAGNOSIS — Z23 Encounter for immunization: Secondary | ICD-10-CM | POA: Diagnosis not present

## 2019-03-23 DIAGNOSIS — E785 Hyperlipidemia, unspecified: Secondary | ICD-10-CM | POA: Diagnosis not present

## 2019-03-23 DIAGNOSIS — F329 Major depressive disorder, single episode, unspecified: Secondary | ICD-10-CM | POA: Diagnosis not present

## 2019-03-23 DIAGNOSIS — I639 Cerebral infarction, unspecified: Secondary | ICD-10-CM | POA: Diagnosis not present

## 2019-03-23 DIAGNOSIS — R399 Unspecified symptoms and signs involving the genitourinary system: Secondary | ICD-10-CM | POA: Diagnosis not present

## 2019-03-23 DIAGNOSIS — E559 Vitamin D deficiency, unspecified: Secondary | ICD-10-CM | POA: Diagnosis not present

## 2019-03-23 DIAGNOSIS — I10 Essential (primary) hypertension: Secondary | ICD-10-CM | POA: Diagnosis not present

## 2019-03-23 DIAGNOSIS — Z0000 Encounter for general adult medical examination without abnormal findings: Secondary | ICD-10-CM | POA: Diagnosis not present

## 2019-03-23 DIAGNOSIS — I2510 Atherosclerotic heart disease of native coronary artery without angina pectoris: Secondary | ICD-10-CM | POA: Diagnosis not present

## 2019-03-23 DIAGNOSIS — F438 Other reactions to severe stress: Secondary | ICD-10-CM | POA: Diagnosis not present

## 2019-03-23 DIAGNOSIS — Z125 Encounter for screening for malignant neoplasm of prostate: Secondary | ICD-10-CM | POA: Diagnosis not present

## 2019-03-23 DIAGNOSIS — R296 Repeated falls: Secondary | ICD-10-CM | POA: Diagnosis not present

## 2019-03-23 DIAGNOSIS — R413 Other amnesia: Secondary | ICD-10-CM | POA: Diagnosis not present

## 2019-03-23 LAB — CUP PACEART REMOTE DEVICE CHECK
Date Time Interrogation Session: 20200404140630
Implantable Pulse Generator Implant Date: 20171206

## 2019-03-31 NOTE — Progress Notes (Signed)
Carelink Summary Report / Loop Recorder 

## 2019-04-03 ENCOUNTER — Telehealth: Payer: Self-pay

## 2019-04-03 NOTE — Telephone Encounter (Signed)
Spoke with patient to remind of missed remote transmission 

## 2019-04-23 ENCOUNTER — Other Ambulatory Visit: Payer: Self-pay

## 2019-04-23 ENCOUNTER — Ambulatory Visit (INDEPENDENT_AMBULATORY_CARE_PROVIDER_SITE_OTHER): Payer: PPO | Admitting: *Deleted

## 2019-04-23 DIAGNOSIS — I639 Cerebral infarction, unspecified: Secondary | ICD-10-CM

## 2019-04-24 LAB — CUP PACEART REMOTE DEVICE CHECK
Date Time Interrogation Session: 20200507144124
Implantable Pulse Generator Implant Date: 20171206

## 2019-04-28 NOTE — Progress Notes (Signed)
Carelink Summary Report / Loop Recorder 

## 2019-05-18 ENCOUNTER — Other Ambulatory Visit: Payer: Self-pay | Admitting: Internal Medicine

## 2019-05-18 NOTE — Telephone Encounter (Signed)
Last refill was 04/20/19 Last OV was 05/22/18 Has CPE tomorrow.

## 2019-05-18 NOTE — Progress Notes (Signed)
Subjective:    Patient ID: Alex Bryant, male    DOB: 10-13-1951, 68 y.o.   MRN: [id number]  HPI He is here for a physical exam.   He is feeling great and has no real concerns.  He is walking regularly and feels the best he has in a long time.   Medications and allergies reviewed with patient and updated if appropriate.  Patient Active Problem List   Diagnosis Date Noted  . Paroxysmal atrial fibrillation ([place]) 09/08/2018  . 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 CAD (coronary artery disease) 08/14/2018  . Progressive angina ([place]) 08/12/2018  . Vitamin B 12 deficiency 11/21/2017  . Diabetic neuropathy ([place]) 01/22/2017  . Cerebral thrombosis with cerebral infarction 11/20/2016  . History of CVA (cerebrovascular accident) without residual deficits 11/20/2016  . Cerebrovascular accident (CVA) due to bilateral embolism of middle cerebral arteries ([place])   . Plantar fasciitis of right foot 02/17/2014  . Lumbosacral spondylosis without myelopathy 05/25/2013  . Essential hypertension 01/24/2011  . MEDIAL MENISCUS TEAR, RIGHT 07/06/2010  . Diabetes mellitus ([place]) 01/06/2009  . URTICARIA 01/06/2009  . Tubular adenoma of colon 08/26/2008  . Mixed hyperlipidemia 02/24/2008  . History of gout 11/14/2007  . Sleep disorder 09/08/2007  . 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 aspirin EC 81 MG EC tablet Take 1 tablet (81 mg total) by mouth daily.    . clopidogrel (PLAVIX) 75 MG tablet Take 1 tablet (75 mg total) by mouth daily. 30 tablet 1  . glucose blood test strip 1 each by Other route as needed. Use as instructed     . Lancets (FREESTYLE) lancets 1 each by Other route 2 (two) times daily. Use to help check blood sugar twice a day. Dx E11.9 100 each 3  . metFORMIN (GLUCOPHAGE) 1000 MG tablet TAKE 1 TABLET(1000 MG) BY MOUTH TWICE DAILY WITH A MEAL 180 tablet 0  . metoprolol tartrate  (LOPRESSOR) 25 MG tablet TAKE 1 TABLET BY MOUTH TWICE DAILY (Patient taking differently: Take 25 mg by mouth daily. (BETA BLOCKER)) 60 tablet 5  . Omega-3 Fatty Acids (FISH OIL) 1000 MG CAPS Take 1 capsule (1,000 mg total) by mouth daily.  0  . ramipril (ALTACE) 2.5 MG capsule Take 1 capsule (2.5 mg total) by mouth daily. 30 capsule 1  . rosuvastatin (CRESTOR) 20 MG tablet TAKE 1 TABLET BY MOUTH EVERY DAY AT 6 PM 90 tablet 0  . zolpidem (AMBIEN) 5 MG tablet TAKE 1 TABLET BY MOUTH AT BEDTIME AS NEEDED 30 tablet 0   No current facility-administered medications on file prior to visit.     Past Medical History:  Diagnosis Date  . Coronary artery disease   . Diet-controlled diabetes mellitus ([place])    AODM  . History of gout   . Hyperlipidemia   . Hypertension   . Progressive angina ([place]) 08/12/2018   Chest pain  . Stroke ([place]) 11/19/2016   "fully recovered" (08/14/2018)  . 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 BACK SURGERY    . COLONOSCOPY W/ POLYPECTOMY  2010; 2015   Dr Olevia Perches  . CORONARY ARTERY BYPASS GRAFT N/A 08/19/2018   Procedure: CORONARY ARTERY BYPASS GRAFTING (CABG) x 5  USING LEFT INTERNAL MAMMARY ARTERY AND ENDOSCOPICALLY HARVESTED RIGHT SAPHENOUS VEIN.;  Surgeon: Ivin Poot, MD;  Location: [facility] OR;  Service: Open Heart Surgery;  Laterality: N/A;  . EP IMPLANTABLE DEVICE N/A 11/21/2016   Procedure: Loop Recorder Insertion;  Surgeon: Thompson Grayer, MD;  Location: [facility];  Service: Cardiovascular;  Laterality: N/A;  . ESI  2014   @L5 -S1 X3   . EXCISIONAL HEMORRHOIDECTOMY  ~ 1995  . FACIAL RECONSTRUCTION SURGERY Right 1990s   plating & pinning of maxilla  . INGUINAL HERNIA REPAIR Bilateral 1959  . LEFT HEART CATH AND CORONARY ANGIOGRAPHY N/A 08/14/2018   Procedure: LEFT HEART CATH AND CORONARY ANGIOGRAPHY;  Surgeon: Lorretta Harp, MD;  Location: [facility];  Service: Cardiovascular;   Laterality: N/A;  . POSTERIOR LUMBAR FUSION  10/17/2013   L5-S1 ; [place]  . TEE WITHOUT CARDIOVERSION N/A 11/21/2016   Procedure: TRANSESOPHAGEAL ECHOCARDIOGRAM (TEE);  Surgeon: Larey Dresser, MD;  Location: [facility];  Service: Cardiovascular;  Laterality: N/A;  . TEE WITHOUT CARDIOVERSION N/A 08/19/2018   Procedure: TRANSESOPHAGEAL ECHOCARDIOGRAM (TEE);  Surgeon: Prescott Gum, Collier Salina, MD;  Location: [facility];  Service: Open Heart Surgery;  Laterality: N/A;    Social History   Socioeconomic History  . Marital status: Married    Spouse name: Not on file  . Number of children: Not on file  . Years of education: Not on file  . Highest education level: Not on file  Occupational History  . Not on file  Social Needs  . Financial resource strain: Not on file  . Food insecurity:    Worry: Not on file    Inability: Not on file  . Transportation needs:    Medical: Not on file    Non-medical: Not on file  Tobacco Use  . Smoking status: Former Smoker    Packs/day: 0.25    Years: 6.00    Pack years: 1.50    Types: Cigarettes    Last attempt to quit: 12/17/1980    Years since quitting: 38.4  . Smokeless tobacco: Never Used  . Tobacco comment: smoked off and on 1966- 1982, up to < 5 cigarettes / day  Substance and Sexual Activity  . Alcohol use: Yes    Alcohol/week: 4.0 standard drinks    Types: 4 Shots of liquor per week    Comment:  socially  . Drug use: Never  . Sexual activity: Yes  Lifestyle  . Physical activity:    Days per week: Not on file    Minutes per session: Not on file  . Stress: Not on file  Relationships  . Social connections:    Talks on phone: Not on file    Gets together: Not on file    Attends religious service: Not on file    Active member of club or organization: Not on file    Attends meetings of clubs or organizations: Not on file    Relationship status: Not on file  Other Topics Concern  . Not on file  Social History Narrative  . Not on  file    Family History  Problem Relation Age of Onset  . Heart attack Father 75  . Hypertension Mother   . Stroke Mother        X17  . Diabetes Sister        "pre Diabetes"  . Stroke Maternal Uncle   . Cancer Neg Hx   . Colon cancer Neg Hx   . Rectal cancer Neg Hx   . Stomach cancer Neg Hx  Review of Systems  Constitutional: Negative for chills and fever.  Eyes: Negative for visual disturbance.  Respiratory: Negative for cough, shortness of breath and wheezing.   Cardiovascular: Negative for chest pain, palpitations and leg swelling.  Gastrointestinal: Negative for abdominal pain, blood in stool, constipation, diarrhea and nausea.       No gerd  Genitourinary: Negative for difficulty urinating, dysuria and hematuria.  Musculoskeletal: Negative for arthralgias, back pain and myalgias.  Skin: Negative for color change and rash.  Neurological: Positive for numbness. Negative for dizziness, light-headedness and headaches.  Psychiatric/Behavioral: Positive for sleep disturbance. Negative for dysphoric mood. The patient is not nervous/anxious.        Objective:   Vitals:   05/19/19 0823  BP: 134/72  Pulse: 74  Resp: 16  Temp: 97.7 F (36.5 C)  SpO2: 96%   Filed Weights   05/19/19 0823  Weight: 205 lb 12.8 oz (93.4 kg)   Body mass index is 27.91 kg/m.  Wt Readings from Last 3 Encounters:  05/19/19 205 lb 12.8 oz (93.4 kg)  12/18/18 212 lb (96.2 kg)  11/26/18 205 lb (93 kg)     Physical Exam Constitutional: He appears well-developed and well-nourished. No distress.  HENT:  Head: Normocephalic and atraumatic.  Right Ear: External ear normal.  Left Ear: External ear normal.  Mouth/Throat: Oropharynx is clear and moist.  Normal ear canals and TM b/l  Eyes: Conjunctivae and EOM are normal.  Neck: Neck supple. No tracheal deviation present. No thyromegaly present. No carotid bruit  Cardiovascular: Normal rate, regular rhythm, normal heart sounds and intact  distal pulses.  No murmur heard. Pulmonary/Chest: Effort normal and breath sounds normal. No respiratory distress. He has no wheezes. He has no rales.  Abdominal: Soft. He exhibits no distension. There is no tenderness.  Genitourinary: deferred  Musculoskeletal: He exhibits no edema.  Lymphadenopathy:   He has no cervical adenopathy.  Skin: Skin is warm and dry. He is not diaphoretic.  Psychiatric: He has a normal mood and affect. His behavior is normal.    Diabetic Foot Exam - Simple   Simple Foot Form Diabetic Foot exam was performed with the following findings:  Yes 05/19/2019  8:52 AM  Visual Inspection No deformities, no ulcerations, no other skin breakdown bilaterally:  Yes See comments:  Yes Sensation Testing See comments:  Yes Pulse Check Posterior Tibialis and Dorsalis pulse intact bilaterally:  Yes Comments Slight decreased sensation b/l plantar surfaces.  Left medial bunion         Assessment & Plan:   Physical exam: Screening blood work    ordered Immunizations  Discussed tdap, shingrix, others up to date Colonoscopy   Up to date  Eye exams    Up to date  Exercise    walking Weight  Good for age. Skin  No concerns Substance abuse   none  See Problem List for Assessment and Plan of chronic medical problems.   FU in 6 months

## 2019-05-18 NOTE — Patient Instructions (Addendum)
Tests ordered today. Your results will be released to [place] (or called to you) after review, usually within 72hours after test completion. If any changes need to be made, you will be notified at that same time.  All other Health Maintenance issues reviewed.   All recommended immunizations and age-appropriate screenings are up-to-date or discussed.  No immunizations administered today.   Medications reviewed and updated.  Changes include :   none    Please followup in 6 months    Health Maintenance, Male A healthy lifestyle and preventive care is important for your health and wellness. Ask your health care provider about what schedule of regular examinations is right for you. What should I know about weight and diet? Eat a Healthy Diet  Eat plenty of vegetables, fruits, whole grains, low-fat dairy products, and lean protein.  Do not eat a lot of foods high in solid fats, added sugars, or salt.  Maintain a Healthy Weight Regular exercise can help you achieve or maintain a healthy weight. You should:  Do at least 150 minutes of exercise each week. The exercise should increase your heart rate and make you sweat (moderate-intensity exercise).  Do strength-training exercises at least twice a week. Watch Your Levels of Cholesterol and Blood Lipids  Have your blood tested for lipids and cholesterol every 5 years starting at 70 years of age. If you are at high risk for heart disease, you should start having your blood tested when you are 70 years old. You may need to have your cholesterol levels checked more often if: ? Your lipid or cholesterol levels are high. ? You are older than 70 years of age. ? You are at high risk for heart disease. What should I know about cancer screening? Many types of cancers can be detected early and may often be prevented. Lung Cancer  You should be screened every year for lung cancer if: ? You are a current smoker who has smoked for at least 30  years. ? You are a former smoker who has quit within the past 15 years.  Talk to your health care provider about your screening options, when you should start screening, and how often you should be screened. Colorectal Cancer  Routine colorectal cancer screening usually begins at 70 years of age and should be repeated every 5-10 years until you are 70 years old. You may need to be screened more often if early forms of precancerous polyps or small growths are found. Your health care provider may recommend screening at an earlier age if you have risk factors for colon cancer.  Your health care provider may recommend using home test kits to check for hidden blood in the stool.  A small camera at the end of a tube can be used to examine your colon (sigmoidoscopy or colonoscopy). This checks for the earliest forms of colorectal cancer. Prostate and Testicular Cancer  Depending on your age and overall health, your health care provider may do certain tests to screen for prostate and testicular cancer.  Talk to your health care provider about any symptoms or concerns you have about testicular or prostate cancer. Skin Cancer  Check your skin from head to toe regularly.  Tell your health care provider about any new moles or changes in moles, especially if: ? There is a change in a mole's size, shape, or color. ? You have a mole that is larger than a pencil eraser.  Always use sunscreen. Apply sunscreen liberally and repeat throughout the  day.  Protect yourself by wearing long sleeves, pants, a wide-brimmed hat, and sunglasses when outside. What should I know about heart disease, diabetes, and high blood pressure?  If you are 16-35 years of age, have your blood pressure checked every 3-5 years. If you are 66 years of age or older, have your blood pressure checked every year. You should have your blood pressure measured twice-once when you are at a hospital or clinic, and once when you are not at a  hospital or clinic. Record the average of the two measurements. To check your blood pressure when you are not at a hospital or clinic, you can use: ? An automated blood pressure machine at a pharmacy. ? A home blood pressure monitor.  Talk to your health care provider about your target blood pressure.  If you are between 10-29 years old, ask your health care provider if you should take aspirin to prevent heart disease.  Have regular diabetes screenings by checking your fasting blood sugar level. ? If you are at a normal weight and have a low risk for diabetes, have this test once every three years after the age of 40. ? If you are overweight and have a high risk for diabetes, consider being tested at a younger age or more often.  A one-time screening for abdominal aortic aneurysm (AAA) by ultrasound is recommended for men aged 67-75 years who are current or former smokers. What should I know about preventing infection? Hepatitis B If you have a higher risk for hepatitis B, you should be screened for this virus. Talk with your health care provider to find out if you are at risk for hepatitis B infection. Hepatitis C Blood testing is recommended for:  Everyone born from [birth year] through 1967.  Anyone with known risk factors for hepatitis C. Sexually Transmitted Diseases (STDs)  You should be screened each year for STDs including gonorrhea and chlamydia if: ? You are sexually active and are younger than 70 years of age. ? You are older than 70 years of age and your health care provider tells you that you are at risk for this type of infection. ? Your sexual activity has changed since you were last screened and you are at an increased risk for chlamydia or gonorrhea. Ask your health care provider if you are at risk.  Talk with your health care provider about whether you are at high risk of being infected with HIV. Your health care provider may recommend a prescription medicine to help prevent  HIV infection. What else can I do?  Schedule regular health, dental, and eye exams.  Stay current with your vaccines (immunizations).  Do not use any tobacco products, such as cigarettes, chewing tobacco, and e-cigarettes. If you need help quitting, ask your health care provider.  Limit alcohol intake to no more than 2 drinks per day. One drink equals 12 ounces of beer, 5 ounces of wine, or 1 ounces of hard liquor.  Do not use street drugs.  Do not share needles.  Ask your health care provider for help if you need support or information about quitting drugs.  Tell your health care provider if you often feel depressed.  Tell your health care provider if you have ever been abused or do not feel safe at home. This information is not intended to replace advice given to you by your health care provider. Make sure you discuss any questions you have with your health care provider. Document Released: 05/31/2008 Document Revised:  08/01/2016 Document Reviewed: 09/06/2015 Elsevier Interactive Patient Education  [employer].

## 2019-05-19 ENCOUNTER — Other Ambulatory Visit: Payer: Self-pay

## 2019-05-19 ENCOUNTER — Encounter: Payer: Self-pay | Admitting: Internal Medicine

## 2019-05-19 ENCOUNTER — Other Ambulatory Visit (INDEPENDENT_AMBULATORY_CARE_PROVIDER_SITE_OTHER): Payer: PPO

## 2019-05-19 ENCOUNTER — Ambulatory Visit (INDEPENDENT_AMBULATORY_CARE_PROVIDER_SITE_OTHER): Payer: PPO | Admitting: Internal Medicine

## 2019-05-19 VITALS — BP 134/72 | HR 74 | Temp 97.7°F | Resp 16 | Ht 72.0 in | Wt 205.8 lb

## 2019-05-19 DIAGNOSIS — Z125 Encounter for screening for malignant neoplasm of prostate: Secondary | ICD-10-CM

## 2019-05-19 DIAGNOSIS — E1159 Type 2 diabetes mellitus with other circulatory complications: Secondary | ICD-10-CM | POA: Diagnosis not present

## 2019-05-19 DIAGNOSIS — I480 Paroxysmal atrial fibrillation: Secondary | ICD-10-CM | POA: Diagnosis not present

## 2019-05-19 DIAGNOSIS — E538 Deficiency of other specified B group vitamins: Secondary | ICD-10-CM

## 2019-05-19 DIAGNOSIS — Z0000 Encounter for general adult medical examination without abnormal findings: Secondary | ICD-10-CM | POA: Diagnosis not present

## 2019-05-19 DIAGNOSIS — I10 Essential (primary) hypertension: Secondary | ICD-10-CM | POA: Diagnosis not present

## 2019-05-19 DIAGNOSIS — I25110 Atherosclerotic heart disease of native coronary artery with unstable angina pectoris: Secondary | ICD-10-CM | POA: Diagnosis not present

## 2019-05-19 DIAGNOSIS — E1142 Type 2 diabetes mellitus with diabetic polyneuropathy: Secondary | ICD-10-CM | POA: Diagnosis not present

## 2019-05-19 DIAGNOSIS — Z8673 Personal history of transient ischemic attack (TIA), and cerebral infarction without residual deficits: Secondary | ICD-10-CM

## 2019-05-19 DIAGNOSIS — G479 Sleep disorder, unspecified: Secondary | ICD-10-CM

## 2019-05-19 DIAGNOSIS — E782 Mixed hyperlipidemia: Secondary | ICD-10-CM

## 2019-05-19 LAB — PSA, MEDICARE: PSA: 1.5 ng/mL (ref 0.10–4.00)

## 2019-05-19 LAB — COMPREHENSIVE METABOLIC PANEL
ALT: 16 U/L (ref 0–53)
AST: 16 U/L (ref 0–37)
Albumin: 4.2 g/dL (ref 3.5–5.2)
Alkaline Phosphatase: 67 U/L (ref 39–117)
BUN: 22 mg/dL (ref 6–23)
CO2: 26 mEq/L (ref 19–32)
Calcium: 9.5 mg/dL (ref 8.4–10.5)
Chloride: 104 mEq/L (ref 96–112)
Creatinine, Ser: 1.12 mg/dL (ref 0.40–1.50)
GFR: 64.91 mL/min (ref >60.00)
Glucose, Bld: 134 mg/dL — ABNORMAL HIGH (ref 70–99)
Potassium: 4.5 mEq/L (ref 3.5–5.1)
Sodium: 138 mEq/L (ref 135–145)
Total Bilirubin: 0.7 mg/dL (ref 0.2–1.2)
Total Protein: 6.8 g/dL (ref 6.0–8.3)

## 2019-05-19 LAB — LIPID PANEL
Cholesterol: 121 mg/dL (ref 0–200)
HDL: 44.9 mg/dL (ref >39.00)
LDL Cholesterol: 50 mg/dL (ref 0–99)
NonHDL: 76.37
Total CHOL/HDL Ratio: 3
Triglycerides: 134 mg/dL (ref 0.0–149.0)
VLDL: 26.8 mg/dL (ref 0.0–40.0)

## 2019-05-19 LAB — CBC WITH DIFFERENTIAL/PLATELET
Basophils Absolute: 0.1 K/uL (ref 0.0–0.1)
Basophils Relative: 1 % (ref 0.0–3.0)
Eosinophils Absolute: 0.6 K/uL (ref 0.0–0.7)
Eosinophils Relative: 8.2 % — ABNORMAL HIGH (ref 0.0–5.0)
HCT: 41.7 % (ref 39.0–52.0)
Hemoglobin: 14.6 g/dL (ref 13.0–17.0)
Lymphocytes Relative: 27.2 % (ref 12.0–46.0)
Lymphs Abs: 1.9 K/uL (ref 0.7–4.0)
MCHC: 35 g/dL (ref 30.0–36.0)
MCV: 89.1 fl (ref 78.0–100.0)
Monocytes Absolute: 0.5 K/uL (ref 0.1–1.0)
Monocytes Relative: 7.5 % (ref 3.0–12.0)
Neutro Abs: 3.9 K/uL (ref 1.4–7.7)
Neutrophils Relative %: 56.1 % (ref 43.0–77.0)
Platelets: 233 K/uL (ref 150.0–400.0)
RBC: 4.68 Mil/uL (ref 4.22–5.81)
RDW: 14 % (ref 11.5–15.5)
WBC: 7 K/uL (ref 4.0–10.5)

## 2019-05-19 LAB — HEMOGLOBIN A1C: Hgb A1c MFr Bld: 6.9 % — ABNORMAL HIGH (ref 4.6–6.5)

## 2019-05-19 LAB — VITAMIN B12: Vitamin B-12: 240 pg/mL (ref 211–911)

## 2019-05-19 LAB — TSH: TSH: 1.16 u[IU]/mL (ref 0.35–4.50)

## 2019-05-19 NOTE — Assessment & Plan Note (Signed)
Not taking B12 - check level

## 2019-05-19 NOTE — Assessment & Plan Note (Signed)
Numbness/burning b/l plantar surfaces of feet Gabapentin was not effective Check B12 He does check his feet Foot exam today shows mild dec sensation

## 2019-05-19 NOTE — Assessment & Plan Note (Signed)
BP well controlled Current regimen effective and well tolerated Continue current medications at current doses cmp  

## 2019-05-19 NOTE — Assessment & Plan Note (Signed)
Check lipid panel,cmp ,tsh Continue daily statin Regular exercise and healthy diet encouraged  

## 2019-05-19 NOTE — Assessment & Plan Note (Signed)
S/p CABG x5 Walking regularly No chest pain, palps or SOB Continue current medication Follows with Cardiology Cbc, cmp. Lipid, tsh

## 2019-05-19 NOTE — Assessment & Plan Note (Signed)
On plavix, asa, metoprolol afib occurred post operatively after CABG Cbc, tsh, cmp

## 2019-05-19 NOTE — Assessment & Plan Note (Signed)
Continue asa, plavix, statin Bp well controlled

## 2019-05-19 NOTE — Assessment & Plan Note (Signed)
Check a1c compliant with a low sugar / carb diet Continue regular exercise Continue metormin

## 2019-05-19 NOTE — Assessment & Plan Note (Addendum)
Taking ambien 80% of the time Effective, no side effects Will continue

## 2019-05-21 ENCOUNTER — Encounter: Payer: Self-pay | Admitting: Internal Medicine

## 2019-05-26 ENCOUNTER — Ambulatory Visit (INDEPENDENT_AMBULATORY_CARE_PROVIDER_SITE_OTHER): Payer: PPO | Admitting: *Deleted

## 2019-05-26 DIAGNOSIS — I495 Sick sinus syndrome: Secondary | ICD-10-CM

## 2019-05-26 LAB — CUP PACEART REMOTE DEVICE CHECK
Date Time Interrogation Session: 20200609133341
Implantable Pulse Generator Implant Date: 20171206

## 2019-06-02 ENCOUNTER — Other Ambulatory Visit: Payer: Self-pay | Admitting: Cardiovascular Disease

## 2019-06-04 NOTE — Progress Notes (Signed)
Carelink Summary Report / Loop Recorder 

## 2019-06-09 ENCOUNTER — Other Ambulatory Visit: Payer: Self-pay | Admitting: Cardiology

## 2019-06-15 ENCOUNTER — Other Ambulatory Visit: Payer: Self-pay | Admitting: Internal Medicine

## 2019-06-22 ENCOUNTER — Other Ambulatory Visit: Payer: Self-pay | Admitting: Internal Medicine

## 2019-06-22 NOTE — Telephone Encounter (Signed)
[place] Controlled Database Checked Last filled: 05/21/19 # 30 LOV w/you: 05/19/19 Next appt w/you: None

## 2019-06-29 ENCOUNTER — Ambulatory Visit (INDEPENDENT_AMBULATORY_CARE_PROVIDER_SITE_OTHER): Payer: PPO | Admitting: *Deleted

## 2019-06-29 DIAGNOSIS — I480 Paroxysmal atrial fibrillation: Secondary | ICD-10-CM

## 2019-06-29 DIAGNOSIS — I63413 Cerebral infarction due to embolism of bilateral middle cerebral arteries: Secondary | ICD-10-CM

## 2019-06-29 LAB — CUP PACEART REMOTE DEVICE CHECK
Date Time Interrogation Session: 20200712153745
Implantable Pulse Generator Implant Date: 20171206

## 2019-07-08 DIAGNOSIS — M546 Pain in thoracic spine: Secondary | ICD-10-CM | POA: Diagnosis not present

## 2019-07-13 ENCOUNTER — Other Ambulatory Visit: Payer: Self-pay

## 2019-07-13 ENCOUNTER — Ambulatory Visit: Payer: PPO | Admitting: Family Medicine

## 2019-07-13 ENCOUNTER — Encounter: Payer: Self-pay | Admitting: Family Medicine

## 2019-07-13 VITALS — BP 146/70 | Ht 72.0 in | Wt 205.0 lb

## 2019-07-13 DIAGNOSIS — S29012A Strain of muscle and tendon of back wall of thorax, initial encounter: Secondary | ICD-10-CM | POA: Insufficient documentation

## 2019-07-13 MED ORDER — METHOCARBAMOL 500 MG PO TABS: 500.0000 mg | ORAL_TABLET | Freq: Three times a day (TID) | ORAL | 1 refills | Status: DC | PRN

## 2019-07-13 NOTE — Progress Notes (Signed)
   PCP: Binnie Rail, MD  Subjective:   HPI: Patient is a 68 y.o. male here for bilateral mid back pain x 4-5 days. He remembers bending over and getting an intense pain in his mid back. It has been constant but at times it is very severe. Feels like a "dull throb" but when it becomes severe feels like a "stabbing pain". This occurs if moving a certain way. Denies any radiation. Does recall having this before but it is just intermittent and comes and goes. It was never this constant or severe. Denies any numbness/tingling. No change with bending forward. Does feel a little relief when lying flat. He notes he had L5-S1 spinal fusion ~8 years ago. MRI of lumbar spine 01/15/2017:  At L4-5 there is a mild broad-based disc bulge. Moderate bilateral facet arthropathy. Bilateral lateral recess narrowing. Mild spinal stenosis. Posterior lumbar interbody fusion at L5-S1 without recurrent foraminal or central canal stenosis.  Denies any trauma or inciting event. Denies cough, congestion, dysuria, fevers, chills. Denies any unexplained weight loss. Treated with Tizanidine 2mg  TID x 2-3 days without improvement. Also took tylenol (3-4/day) no improvement.   Review of Systems:  Per HPI.   [place], medications and smoking status reviewed.      Objective:  Physical Exam:  Gen: awake, alert, NAD, comfortable in exam room Pulm: breathing unlabored  Back: No gross deformity, scoliosis. No midline or bony TTP. FROM of cervical, thoracic, lumbar spine. Equal but decreased cervical side bending bilaterally. Some tenderness at ~T9 with extension Strength LEs 5/5 all muscle groups.   Decreased but equal bicep, tricep, brachioradialis, patellar, and achilles reflexes   Sensation intact to light touch bilaterally. Negative fabers and piriformis stretches.  Bilateral hips: No deformity. FROM with 5/5 strength. No tenderness to palpation. NVI distally. Negative logroll bilaterally.   Assessment & Plan:    Strain of thoracic paraspinal muscles excluding T1 and T2 levels History and exam most consistent with strain of  deep paraspinal muscles of the thoracic region. Do not feel imaging is warranted as it does not appear to be bony in origin. Given that he describes the pain as stabbing is concerning, however lack of SOB, chest pain, or infectious symptoms are reassuring that this is MSK and not cardiovascular or renal. Recommended conservative treatment at this time. - home exercises - tylenol or topical analgesics for pain - Robaxin for muscle spasms  - Patient agreed to this plan - RTC in 4-6 weeks, sooner if no improvement or worsening   Meds ordered this encounter  Medications  . methocarbamol (ROBAXIN) 500 MG tablet    Sig: Take 1 tablet (500 mg total) by mouth every 8 (eight) hours as needed.    Dispense:  60 tablet    Refill:  1    Mina Marble, DO [facility], PGY2 07/13/2019 5:10 PM

## 2019-07-13 NOTE — Assessment & Plan Note (Addendum)
History and exam most consistent with strain of  deep paraspinal muscles of the thoracic region. Do not feel imaging is warranted as it does not appear to be bony in origin. Given that he describes the pain as stabbing is concerning, however lack of SOB, chest pain, or infectious symptoms are reassuring that this is MSK and not cardiovascular or renal. Recommended conservative treatment at this time. - home exercises - tylenol or topical analgesics for pain - Robaxin for muscle spasms  - Patient agreed to this plan - RTC in 4-6 weeks, sooner if no improvement or worsening

## 2019-07-13 NOTE — Patient Instructions (Signed)
You have a thoracic strain. Heat 15 minutes at a time 3-4 times a day. Ok to take tylenol for baseline pain relief (1-2 extra strength tabs 3x/day) as needed. Robaxin as needed for muscle spasms (no driving on this medicine if it makes you sleepy). Topical salon pas patches, capsaicin up to 4 times a day may be beneficial. Stay as active as possible. Physical therapy has been shown to be helpful as well - consider this if not improving with the home exercises. Follow up with me in 1 month to 6 weeks.Marland Kitchen

## 2019-07-13 NOTE — Progress Notes (Signed)
Carelink Summary Report / Loop Recorder 

## 2019-07-14 ENCOUNTER — Encounter: Payer: Self-pay | Admitting: Family Medicine

## 2019-07-14 ENCOUNTER — Other Ambulatory Visit: Payer: Self-pay | Admitting: Internal Medicine

## 2019-07-14 MED ORDER — BACLOFEN 10 MG PO TABS: 10.0000 mg | ORAL_TABLET | Freq: Three times a day (TID) | ORAL | 1 refills | Status: DC | PRN

## 2019-07-14 NOTE — Addendum Note (Signed)
Addended by: Dene Gentry on: 07/14/2019 01:06 PM   Modules accepted: Orders

## 2019-07-15 ENCOUNTER — Other Ambulatory Visit: Payer: Self-pay | Admitting: *Deleted

## 2019-07-20 ENCOUNTER — Other Ambulatory Visit: Payer: Self-pay | Admitting: Internal Medicine

## 2019-07-20 NOTE — Telephone Encounter (Signed)
Last OV 05/19/19 Next OV NA Last RF 06/22/19

## 2019-07-24 ENCOUNTER — Other Ambulatory Visit: Payer: Self-pay | Admitting: Internal Medicine

## 2019-07-31 ENCOUNTER — Encounter: Payer: PPO | Admitting: *Deleted

## 2019-08-17 ENCOUNTER — Other Ambulatory Visit: Payer: Self-pay | Admitting: Internal Medicine

## 2019-08-18 NOTE — Telephone Encounter (Signed)
Last OV was 05/19/19 Next OV NA Last RF 07/20/19

## 2019-09-02 ENCOUNTER — Ambulatory Visit (INDEPENDENT_AMBULATORY_CARE_PROVIDER_SITE_OTHER): Payer: PPO | Admitting: *Deleted

## 2019-09-02 DIAGNOSIS — I63413 Cerebral infarction due to embolism of bilateral middle cerebral arteries: Secondary | ICD-10-CM

## 2019-09-03 LAB — CUP PACEART REMOTE DEVICE CHECK
Date Time Interrogation Session: 20200916161231
Implantable Pulse Generator Implant Date: 20171206

## 2019-09-08 ENCOUNTER — Encounter: Payer: Self-pay | Admitting: Internal Medicine

## 2019-09-08 NOTE — Progress Notes (Signed)
Carelink Summary Report / Loop Recorder 

## 2019-09-30 ENCOUNTER — Encounter: Payer: Self-pay | Admitting: Gastroenterology

## 2019-10-05 ENCOUNTER — Ambulatory Visit (INDEPENDENT_AMBULATORY_CARE_PROVIDER_SITE_OTHER): Payer: PPO | Admitting: *Deleted

## 2019-10-05 DIAGNOSIS — I480 Paroxysmal atrial fibrillation: Secondary | ICD-10-CM

## 2019-10-05 DIAGNOSIS — I63413 Cerebral infarction due to embolism of bilateral middle cerebral arteries: Secondary | ICD-10-CM

## 2019-10-06 LAB — CUP PACEART REMOTE DEVICE CHECK
Date Time Interrogation Session: 20201019161319
Implantable Pulse Generator Implant Date: 20171206

## 2019-10-23 ENCOUNTER — Other Ambulatory Visit: Payer: Self-pay | Admitting: Internal Medicine

## 2019-10-27 ENCOUNTER — Encounter: Payer: Self-pay | Admitting: Internal Medicine

## 2019-10-27 NOTE — Progress Notes (Signed)
Carelink Summary Report / Loop Recorder 

## 2019-11-01 DIAGNOSIS — J069 Acute upper respiratory infection, unspecified: Secondary | ICD-10-CM | POA: Diagnosis not present

## 2019-11-01 DIAGNOSIS — R05 Cough: Secondary | ICD-10-CM | POA: Diagnosis not present

## 2019-11-01 DIAGNOSIS — J3089 Other allergic rhinitis: Secondary | ICD-10-CM | POA: Diagnosis not present

## 2019-11-01 DIAGNOSIS — Z9189 Other specified personal risk factors, not elsewhere classified: Secondary | ICD-10-CM | POA: Diagnosis not present

## 2019-11-02 ENCOUNTER — Telehealth: Payer: PPO | Admitting: Physician Assistant

## 2019-11-02 ENCOUNTER — Other Ambulatory Visit: Payer: Self-pay

## 2019-11-02 DIAGNOSIS — Z20828 Contact with and (suspected) exposure to other viral communicable diseases: Secondary | ICD-10-CM

## 2019-11-02 DIAGNOSIS — Z20822 Contact with and (suspected) exposure to covid-19: Secondary | ICD-10-CM

## 2019-11-02 MED ORDER — BENZONATATE 100 MG PO CAPS: 100.0000 mg | ORAL_CAPSULE | Freq: Three times a day (TID) | ORAL | 0 refills | Status: DC

## 2019-11-02 MED ORDER — ALBUTEROL SULFATE HFA 108 (90 BASE) MCG/ACT IN AERS: 2.0000 | INHALATION_SPRAY | Freq: Four times a day (QID) | RESPIRATORY_TRACT | 0 refills | Status: DC | PRN

## 2019-11-02 NOTE — Progress Notes (Signed)
Based on what you shared with me, I feel your condition warrants further evaluation and I recommend that you be seen for a face to face visit.  I understand that you are not having shortness of breath or chest pain, but I am concerned that you have had several days of fever and cough. While it may be a possibility that you have the coronavirus, it is also possible that you may have a bacterial pneumonia and I would recommend that you be seen in person so that your vital signs can be checked and you can get an xray of your lungs.   NOTE: If you entered your credit card information for this eVisit, you will not be charged. You may see a "hold" on your card for the $35 but that hold will drop off and you will not have a charge processed.  If you are having a true medical emergency please call 911.     For an urgent face to face visit, [facility] has four urgent care centers for your convenience:    NEW:  [facility]   https://www.google.com/maps/dir/?[email or web address]&destination=[facility]%2c+[address]+[place]%2c+[place]+[address]                                                   [place], [place] [address] .  Monday - Friday 10 am - 6 pm    . [facility]    [phone]                  Get Driving Directions  [id number] [place], [place] [address] . 10 am to 8 pm Monday-Friday . 12 pm to 8 pm Saturday-Sunday   . [facility] [place]                  Get Driving Directions  [phone] [address], [place], [place] [address] . 8 am to 8 pm Monday-Friday . 9 am to 6 pm Saturday . 11 am to 6 pm Sunday     . [facility] [place]                  Get Driving Directions   [address] [place], [place] [address] . 8 am to 8 pm Monday-Friday . 8 am to 4 pm  Saturday-Sunday    . [facility] [place]                    Get Driving Directions  [id number]  [address], [place],  [address]  . Monday-Friday, 12 PM to 6 PM    Your e-visit answers were reviewed by a board certified advanced clinical practitioner to complete your personal care plan.  Thank you for using e-Visits.  Greater than 5 minutes, yet less than 10 minutes of time have been spend researching, coordinating, and implementing care for this patient today.

## 2019-11-04 LAB — NOVEL CORONAVIRUS, NAA: SARS-CoV-2, NAA: NOT DETECTED

## 2019-11-08 LAB — CUP PACEART REMOTE DEVICE CHECK
Date Time Interrogation Session: 20201121111604
Implantable Pulse Generator Implant Date: 20171206

## 2019-11-09 ENCOUNTER — Ambulatory Visit (INDEPENDENT_AMBULATORY_CARE_PROVIDER_SITE_OTHER): Payer: PPO | Admitting: *Deleted

## 2019-11-09 DIAGNOSIS — I63413 Cerebral infarction due to embolism of bilateral middle cerebral arteries: Secondary | ICD-10-CM | POA: Diagnosis not present

## 2019-11-09 NOTE — Patient Instructions (Addendum)
  Tests ordered today. Your results will be released to MyChart (or called to you) after review.  If any changes need to be made, you will be notified at that same time.    Medications reviewed and updated.  Changes include :   none     Please followup in 6 months   

## 2019-11-09 NOTE — Progress Notes (Signed)
Subjective:    Patient ID: Alex Bryant, male    DOB: 06/29/1951, 68 y.o.   MRN: [id number]  HPI The patient is here for follow up.  He is exercising regularly.    Diabetes: He is taking his medication daily as prescribed. He is compliant with a diabetic diet.  He monitors his sugars on occasion and they have been running 102-120 in morning. He checks his feet daily and denies foot lesions. He is up-to-date with an ophthalmology examination.   Hypertension: He is taking his medication daily. He is compliant with a low sodium diet.  He denies chest pain, palpitations, edema, shortness of breath and regular headaches.  He does not monitor his blood pressure at home.    Hyperlipidemia: He is taking his medication daily. He is compliant with a low fat/cholesterol diet. He denies myalgias.   H/o CVA:  He is taking plavix and statin daily.   Sleep disorder:  He is taking ambien almost nightly.  He denies side effects and the medication helps him get to sleep.    Medications and allergies reviewed with patient and updated if appropriate.  Patient Active Problem List   Diagnosis Date Noted  . Strain of thoracic paraspinal muscles excluding T1 and T2 levels 07/13/2019  . Paroxysmal atrial fibrillation ([place]) 09/08/2018  . 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 CAD (coronary artery disease) 08/14/2018  . Progressive angina ([place]) 08/12/2018  . Vitamin B 12 deficiency 11/21/2017  . Diabetic neuropathy ([place]) 01/22/2017  . Cerebral thrombosis with cerebral infarction 11/20/2016  . History of CVA (cerebrovascular accident) without residual deficits 11/20/2016  . 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 Plantar fasciitis of right foot 02/17/2014  . Lumbosacral spondylosis without myelopathy 05/25/2013  . Essential hypertension 01/24/2011  . MEDIAL MENISCUS TEAR, RIGHT 07/06/2010  . Diabetes mellitus ([place]) 01/06/2009  . URTICARIA 01/06/2009  .  Tubular adenoma of colon 08/26/2008  . Mixed hyperlipidemia 02/24/2008  . History of gout 11/14/2007  . Sleep disorder 09/08/2007  . 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 aspirin EC 81 MG EC tablet Take 1 tablet (81 mg total) by mouth daily.    . baclofen (LIORESAL) 10 MG tablet Take 1 tablet (10 mg total) by mouth 3 (three) times daily as needed for muscle spasms. 60 each 1  . celecoxib (CELEBREX) 200 MG capsule TK 1 C PO D    . clopidogrel (PLAVIX) 75 MG tablet Take 1 tablet by mouth daily. Need office visit for more refills. 90 tablet 0  . glucose blood test strip 1 each by Other route as needed. Use as instructed     . Lancets (FREESTYLE) lancets 1 each by Other route 2 (two) times daily. Use to help check blood sugar twice a day. Dx E11.9 100 each 3  . metFORMIN (GLUCOPHAGE) 1000 MG tablet TAKE 1 TABLET(1000 MG) BY MOUTH TWICE DAILY WITH A MEAL 180 tablet 1  . metoprolol tartrate (LOPRESSOR) 25 MG tablet Take 1 tablet (25 mg total) by mouth daily. (BETA BLOCKER) 90 tablet 1  . Omega-3 Fatty Acids (FISH OIL) 1000 MG CAPS Take 1 capsule (1,000 mg total) by mouth daily.  0  . ramipril (ALTACE) 5 MG capsule TAKE 1 CAPSULE(5 MG) BY MOUTH DAILY 90 capsule 1  . rosuvastatin (CRESTOR) 20 MG tablet TAKE 1 TABLET  BY MOUTH EVERY DAY AT 6 PM 90 tablet 2  . zolpidem (AMBIEN) 5 MG tablet TAKE 1 TABLET BY MOUTH AT BEDTIME AS NEEDED 30 tablet 2   No current facility-administered medications on file prior to visit.     Past Medical History:  Diagnosis Date  . Coronary artery disease   . Diet-controlled diabetes mellitus ([place])    AODM  . History of gout   . Hyperlipidemia   . Hypertension   . Progressive angina ([place]) 08/12/2018   Chest pain  . Stroke ([place]) 11/19/2016   "fully recovered" (08/14/2018)  . Tick bite [phone]   Erlichiosis;Eulis Canner MD, ID    Past Surgical History:  Procedure  Laterality Date  . BACK SURGERY    . COLONOSCOPY W/ POLYPECTOMY  2010; 2015   Dr Olevia Perches  . CORONARY ARTERY BYPASS GRAFT N/A 08/19/2018   Procedure: CORONARY ARTERY BYPASS GRAFTING (CABG) x 5  USING LEFT INTERNAL MAMMARY ARTERY AND ENDOSCOPICALLY HARVESTED RIGHT SAPHENOUS VEIN.;  Surgeon: Ivin Poot, MD;  Location: [facility];  Service: Open Heart Surgery;  Laterality: N/A;  . EP IMPLANTABLE DEVICE N/A 11/21/2016   Procedure: Loop Recorder Insertion;  Surgeon: Thompson Grayer, MD;  Location: [facility];  Service: Cardiovascular;  Laterality: N/A;  . ESI  2014   @L5 -S1 X3   . EXCISIONAL HEMORRHOIDECTOMY  ~ 1995  . FACIAL RECONSTRUCTION SURGERY Right 1990s   plating & pinning of maxilla  . INGUINAL HERNIA REPAIR Bilateral 1959  . LEFT HEART CATH AND CORONARY ANGIOGRAPHY N/A 08/14/2018   Procedure: LEFT HEART CATH AND CORONARY ANGIOGRAPHY;  Surgeon: Lorretta Harp, MD;  Location: [facility];  Service: Cardiovascular;  Laterality: N/A;  . POSTERIOR LUMBAR FUSION  10/17/2013   L5-S1 ; [facility]  . TEE WITHOUT CARDIOVERSION N/A 11/21/2016   Procedure: TRANSESOPHAGEAL ECHOCARDIOGRAM (TEE);  Surgeon: Larey Dresser, MD;  Location: [facility];  Service: Cardiovascular;  Laterality: N/A;  . TEE WITHOUT CARDIOVERSION N/A 08/19/2018   Procedure: TRANSESOPHAGEAL ECHOCARDIOGRAM (TEE);  Surgeon: Prescott Gum, Collier Salina, MD;  Location: [facility];  Service: Open Heart Surgery;  Laterality: N/A;    Social History   Socioeconomic History  . Marital status: Married    Spouse name: Not on file  . Number of children: Not on file  . Years of education: Not on file  . Highest education level: Not on file  Occupational History  . Not on file  Social Needs  . Financial resource strain: Not on file  . Food insecurity    Worry: Not on file    Inability: Not on file  . Transportation needs    Medical: Not on file    Non-medical: Not on file  Tobacco Use  . Smoking status: Former Smoker     Packs/day: 0.25    Years: 6.00    Pack years: 1.50    Types: Cigarettes    Quit date: 12/17/1980    Years since quitting: 38.9  . Smokeless tobacco: Never Used  . Tobacco comment: smoked off and on 1966- 1982, up to < 5 cigarettes / day  Substance and Sexual Activity  . Alcohol use: Yes    Alcohol/week: 4.0 standard drinks    Types: 4 Shots of liquor per week    Comment:  socially  . Drug use: Never  . Sexual activity: Yes  Lifestyle  . Physical activity    Days per week: Not on file    Minutes per session:  Not on file  . Stress: Not on file  Relationships  . Social Herbalist on phone: Not on file    Gets together: Not on file    Attends religious service: Not on file    Active member of club or organization: Not on file    Attends meetings of clubs or organizations: Not on file    Relationship status: Not on file  Other Topics Concern  . Not on file  Social History Narrative  . Not on file    Family History  Problem Relation Age of Onset  . Heart attack Father 106  . Hypertension Mother   . Stroke Mother        X62  . Diabetes Sister        "pre Diabetes"  . Stroke Maternal Uncle   . Cancer Neg Hx   . Colon cancer Neg Hx   . Rectal cancer Neg Hx   . Stomach cancer Neg Hx     Review of Systems  Constitutional: Negative for chills and fever.  HENT: Positive for postnasal drip.   Respiratory: Negative for cough, shortness of breath and wheezing.   Cardiovascular: Negative for chest pain, palpitations and leg swelling.  Gastrointestinal: Negative for abdominal pain and nausea.       No gerd  Neurological: Negative for dizziness, weakness, light-headedness, numbness and headaches.       Objective:   Vitals:   11/10/19 0800  BP: (!) 142/72  Pulse: (!) 57  Resp: 16  Temp: 98 F (36.7 C)  SpO2: 97%   BP Readings from Last 3 Encounters:  11/10/19 (!) 142/72  07/13/19 (!) 146/70  05/19/19 134/72   Wt Readings from Last 3 Encounters:  11/10/19  203 lb (92.1 kg)  07/13/19 205 lb (93 kg)  05/19/19 205 lb 12.8 oz (93.4 kg)   Body mass index is 27.53 kg/m.   Physical Exam    Constitutional: Appears well-developed and well-nourished. No distress.  HENT:  Head: Normocephalic and atraumatic.  Neck: Neck supple. No tracheal deviation present. No thyromegaly present.  No cervical lymphadenopathy Cardiovascular: Normal rate, regular rhythm and normal heart sounds.   No murmur heard. No carotid bruit .  No edema Pulmonary/Chest: Effort normal and breath sounds normal. No respiratory distress. No has no wheezes. No rales.  Skin: Skin is warm and dry. Not diaphoretic.  Psychiatric: Normal mood and affect. Behavior is normal.      Assessment & Plan:   He will make an eye appt and schedule his colonoscopy.   See Problem List for Assessment and Plan of chronic medical problems.

## 2019-11-10 ENCOUNTER — Encounter: Payer: Self-pay | Admitting: Internal Medicine

## 2019-11-10 ENCOUNTER — Ambulatory Visit (INDEPENDENT_AMBULATORY_CARE_PROVIDER_SITE_OTHER): Payer: PPO | Admitting: Internal Medicine

## 2019-11-10 ENCOUNTER — Other Ambulatory Visit (INDEPENDENT_AMBULATORY_CARE_PROVIDER_SITE_OTHER): Payer: PPO

## 2019-11-10 ENCOUNTER — Other Ambulatory Visit: Payer: Self-pay

## 2019-11-10 VITALS — BP 142/72 | HR 57 | Temp 98.0°F | Resp 16 | Ht 72.0 in | Wt 203.0 lb

## 2019-11-10 DIAGNOSIS — E782 Mixed hyperlipidemia: Secondary | ICD-10-CM

## 2019-11-10 DIAGNOSIS — E538 Deficiency of other specified B group vitamins: Secondary | ICD-10-CM

## 2019-11-10 DIAGNOSIS — E1159 Type 2 diabetes mellitus with other circulatory complications: Secondary | ICD-10-CM

## 2019-11-10 DIAGNOSIS — Z8673 Personal history of transient ischemic attack (TIA), and cerebral infarction without residual deficits: Secondary | ICD-10-CM

## 2019-11-10 DIAGNOSIS — I10 Essential (primary) hypertension: Secondary | ICD-10-CM | POA: Diagnosis not present

## 2019-11-10 DIAGNOSIS — G479 Sleep disorder, unspecified: Secondary | ICD-10-CM

## 2019-11-10 LAB — CBC WITH DIFFERENTIAL/PLATELET
Basophils Absolute: 0.1 10*3/uL (ref 0.0–0.1)
Basophils Relative: 0.8 % (ref 0.0–3.0)
Eosinophils Absolute: 0.5 10*3/uL (ref 0.0–0.7)
Eosinophils Relative: 7.6 % — ABNORMAL HIGH (ref 0.0–5.0)
HCT: 41.2 % (ref 39.0–52.0)
Hemoglobin: 14.1 g/dL (ref 13.0–17.0)
Lymphocytes Relative: 29.2 % (ref 12.0–46.0)
Lymphs Abs: 1.9 10*3/uL (ref 0.7–4.0)
MCHC: 34.2 g/dL (ref 30.0–36.0)
MCV: 89.7 fl (ref 78.0–100.0)
Monocytes Absolute: 0.5 10*3/uL (ref 0.1–1.0)
Monocytes Relative: 7.8 % (ref 3.0–12.0)
Neutro Abs: 3.6 10*3/uL (ref 1.4–7.7)
Neutrophils Relative %: 54.6 % (ref 43.0–77.0)
Platelets: 262 10*3/uL (ref 150.0–400.0)
RBC: 4.59 Mil/uL (ref 4.22–5.81)
RDW: 13.6 % (ref 11.5–15.5)
WBC: 6.6 10*3/uL (ref 4.0–10.5)

## 2019-11-10 LAB — LIPID PANEL
Cholesterol: 120 mg/dL (ref 0–200)
HDL: 38.8 mg/dL — ABNORMAL LOW (ref >39.00)
LDL Cholesterol: 53 mg/dL (ref 0–99)
NonHDL: 81.69
Total CHOL/HDL Ratio: 3
Triglycerides: 144 mg/dL (ref 0.0–149.0)
VLDL: 28.8 mg/dL (ref 0.0–40.0)

## 2019-11-10 LAB — COMPREHENSIVE METABOLIC PANEL
ALT: 17 U/L (ref 0–53)
AST: 16 U/L (ref 0–37)
Albumin: 4 g/dL (ref 3.5–5.2)
Alkaline Phosphatase: 65 U/L (ref 39–117)
BUN: 19 mg/dL (ref 6–23)
CO2: 25 mEq/L (ref 19–32)
Calcium: 9.4 mg/dL (ref 8.4–10.5)
Chloride: 107 mEq/L (ref 96–112)
Creatinine, Ser: 1.07 mg/dL (ref 0.40–1.50)
GFR: 68.33 mL/min (ref >60.00)
Glucose, Bld: 150 mg/dL — ABNORMAL HIGH (ref 70–99)
Potassium: 4.5 mEq/L (ref 3.5–5.1)
Sodium: 140 mEq/L (ref 135–145)
Total Bilirubin: 0.5 mg/dL (ref 0.2–1.2)
Total Protein: 7.2 g/dL (ref 6.0–8.3)

## 2019-11-10 LAB — HEMOGLOBIN A1C: Hgb A1c MFr Bld: 6.8 % — ABNORMAL HIGH (ref 4.6–6.5)

## 2019-11-10 LAB — VITAMIN B12: Vitamin B-12: 308 pg/mL (ref 211–911)

## 2019-11-10 NOTE — Assessment & Plan Note (Signed)
Will make eye exam  Check a1c Low sugar / carb diet Stressed regular exercise Continue metformin

## 2019-11-10 NOTE — Assessment & Plan Note (Signed)
Check cmp, cbc, lipids Continue plavix, crestor BP reasonably controlled - usually lower at home Continue regular exercise

## 2019-11-10 NOTE — Assessment & Plan Note (Signed)
Check lipid panel  Continue daily statin Regular exercise and healthy diet encouraged  

## 2019-11-10 NOTE — Assessment & Plan Note (Signed)
BP well controlled Current regimen effective and well tolerated Continue current medications at current doses cmp  

## 2019-11-10 NOTE — Assessment & Plan Note (Signed)
Controlled, stable Continue current dose of medication ambien most nights

## 2019-11-10 NOTE — Assessment & Plan Note (Signed)
Not taking any B12 Check level

## 2019-11-13 ENCOUNTER — Encounter: Payer: Self-pay | Admitting: Internal Medicine

## 2019-11-17 ENCOUNTER — Other Ambulatory Visit: Payer: Self-pay

## 2019-11-17 MED ORDER — ZOLPIDEM TARTRATE 5 MG PO TABS: 5.0000 mg | ORAL_TABLET | Freq: Every evening | ORAL | 2 refills | Status: DC | PRN

## 2019-11-17 NOTE — Telephone Encounter (Signed)
Last OV 11/10/19 Next OV NA Last RF 10/15/19

## 2019-11-24 ENCOUNTER — Other Ambulatory Visit: Payer: Self-pay | Admitting: Cardiovascular Disease

## 2019-12-02 ENCOUNTER — Encounter: Payer: Self-pay | Admitting: Internal Medicine

## 2019-12-15 ENCOUNTER — Other Ambulatory Visit: Payer: Self-pay

## 2019-12-15 MED ORDER — ROSUVASTATIN CALCIUM 20 MG PO TABS: 20.0000 mg | ORAL_TABLET | Freq: Every day | ORAL | 2 refills | Status: DC

## 2019-12-22 ENCOUNTER — Encounter: Payer: Self-pay | Admitting: Gastroenterology

## 2019-12-22 ENCOUNTER — Other Ambulatory Visit: Payer: Self-pay

## 2019-12-22 ENCOUNTER — Ambulatory Visit (INDEPENDENT_AMBULATORY_CARE_PROVIDER_SITE_OTHER): Payer: PPO | Admitting: Gastroenterology

## 2019-12-22 VITALS — BP 138/72 | HR 54 | Temp 97.1°F | Ht 72.0 in | Wt 208.0 lb

## 2019-12-22 DIAGNOSIS — Z7901 Long term (current) use of anticoagulants: Secondary | ICD-10-CM

## 2019-12-22 DIAGNOSIS — Z86010 Personal history of colonic polyps: Secondary | ICD-10-CM | POA: Diagnosis not present

## 2019-12-22 NOTE — Progress Notes (Signed)
[facility] Consult Note:  History: Alex Bryant 12/22/2019  Referring provider:n/a  Reason for consult/chief complaint: history of colon polyps (pt due for colon; on Plavix; pt has no GI complaints at this time)   Subjective  HPI:  Colonoscopy by Dr. Olevia Perches November 2015 removed a tubular adenoma from the cecum (reportedly 10 mm, but smaller on pathologic examination), a diminutive ascending colon SSP and diminutive sigmoid colon HP. Colonoscopy July 2010 removed diminutive ileocecal valve tubular adenoma.  Alex Bryant feeling well, denies abdominal pain, altered bowel habits or rectal bleeding.  He denies heartburn, dysphagia, odynophagia, nausea vomiting early satiety or weight loss. 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  ROS:  Review of Systems  Constitutional: Negative for appetite change and unexpected weight change.  HENT: Negative for mouth sores and voice change.   Eyes: Negative for pain and redness.  Respiratory: Negative for cough and shortness of breath.   Cardiovascular: Negative for chest pain and palpitations.  Genitourinary: Negative for dysuria and hematuria.  Musculoskeletal: Positive for arthralgias. Negative for myalgias.  Skin: Negative for pallor and rash.  Neurological: Negative for weakness and headaches.  Hematological: Negative for adenopathy.     Past Medical History: Past Medical History:  Diagnosis Date  . Coronary artery disease   . Diet-controlled diabetes mellitus ([place])    AODM  . History of gout   . Hyperlipidemia   . Hypertension   . Progressive angina ([place]) 08/12/2018   Chest pain  . Stroke ([place]) 11/19/2016   "fully recovered" (08/14/2018)  . Tick bite [phone]   Erlichiosis;Eulis Canner MD, ID     Past Surgical History: Past Surgical History:  Procedure Laterality Date  . BACK SURGERY    . COLONOSCOPY W/ POLYPECTOMY  2010; 2015   Dr Olevia Perches  .  CORONARY ARTERY BYPASS GRAFT N/A 08/19/2018   Procedure: CORONARY ARTERY BYPASS GRAFTING (CABG) x 5  USING LEFT INTERNAL MAMMARY ARTERY AND ENDOSCOPICALLY HARVESTED RIGHT SAPHENOUS VEIN.;  Surgeon: Ivin Poot, MD;  Location: [place];  Service: Open Heart Surgery;  Laterality: N/A;  . EP IMPLANTABLE DEVICE N/A 11/21/2016   Procedure: Loop Recorder Insertion;  Surgeon: Thompson Grayer, MD;  Location: [place] [facility];  Service: Cardiovascular;  Laterality: N/A;  . ESI  2014   @L5 -S1 X3   . EXCISIONAL HEMORRHOIDECTOMY  ~ 1995  . FACIAL RECONSTRUCTION SURGERY Right 1990s   plating & pinning of maxilla  . INGUINAL HERNIA REPAIR Bilateral 1959  . LEFT HEART CATH AND CORONARY ANGIOGRAPHY N/A 08/14/2018   Procedure: LEFT HEART CATH AND CORONARY ANGIOGRAPHY;  Surgeon: Lorretta Harp, MD;  Location: [place] [facility];  Service: Cardiovascular;  Laterality: N/A;  . POSTERIOR LUMBAR FUSION  10/17/2013   L5-S1 ; [place]  . TEE WITHOUT CARDIOVERSION N/A 11/21/2016   Procedure: TRANSESOPHAGEAL ECHOCARDIOGRAM (TEE);  Surgeon: Larey Dresser, MD;  Location: [place];  Service: Cardiovascular;  Laterality: N/A;  . TEE WITHOUT CARDIOVERSION N/A 08/19/2018   Procedure: TRANSESOPHAGEAL ECHOCARDIOGRAM (TEE);  Surgeon: Prescott Gum, Collier Salina, MD;  Location: [place];  Service: Open Heart Surgery;  Laterality: N/A;     Family History: Family History  Problem Relation Age of Onset  . Heart attack Father 55  . Hypertension Mother   . Stroke Mother        X85  . Diabetes Sister        "pre Diabetes"  . Stroke Maternal Uncle   .  Cancer Neg Hx   . Colon cancer Neg Hx   . Rectal cancer Neg Hx   . Stomach cancer Neg Hx     Social History: Social History   Socioeconomic History  . Marital status: Married    Spouse name: Not on file  . Number of children: Not on file  . Years of education: Not on file  . Highest education level: Not on file  Occupational History  . Not on file  Tobacco Use  .  Smoking status: Former Smoker    Packs/day: 0.25    Years: 6.00    Pack years: 1.50    Types: Cigarettes    Quit date: 12/17/1980    Years since quitting: 39.0  . Smokeless tobacco: Never Used  . Tobacco comment: smoked off and on 1966- 1982, up to < 5 cigarettes / day  Substance and Sexual Activity  . Alcohol use: Yes    Alcohol/week: 4.0 standard drinks    Types: 4 Shots of liquor per week    Comment:  socially  . Drug use: Never  . Sexual activity: Yes  Other Topics Concern  . Not on file  Social History Narrative  . Not on file   Social Determinants of Health   Financial Resource Strain:   . Difficulty of Paying Living Expenses: Not on file  Food Insecurity:   . Worried About Charity fundraiser in the Last Year: Not on file  . Ran Out of Food in the Last Year: Not on file  Transportation Needs:   . Lack of Transportation (Medical): Not on file  . Lack of Transportation (Non-Medical): Not on file  Physical Activity:   . Days of Exercise per Week: Not on file  . Minutes of Exercise per Session: Not on file  Stress:   . Feeling of Stress : Not on file  Social Connections:   . Frequency of Communication with Friends and Family: Not on file  . Frequency of Social Gatherings with Friends and Family: Not on file  . Attends Religious Services: Not on file  . Active Member of Clubs or Organizations: Not on file  . Attends Archivist Meetings: Not on file  . Marital Status: Not on file    Allergies: Allergies  Allergen Reactions  . Oxycodone Anxiety    [name], confused, and constipation    Outpatient Meds: Current Outpatient Medications  Medication Sig Dispense Refill  . aspirin EC 81 MG EC tablet Take 1 tablet (81 mg total) by mouth daily.    . clopidogrel (PLAVIX) 75 MG tablet Take 1 tablet by mouth daily. Need office visit for more refills. 90 tablet 0  . glucose blood test strip 1 each by Other route as needed. Use as instructed     . Lancets  (FREESTYLE) lancets 1 each by Other route 2 (two) times daily. Use to help check blood sugar twice a day. Dx E11.9 100 each 3  . metFORMIN (GLUCOPHAGE) 1000 MG tablet TAKE 1 TABLET(1000 MG) BY MOUTH TWICE DAILY WITH A MEAL 180 tablet 1  . metoprolol tartrate (LOPRESSOR) 25 MG tablet TAKE 1 TABLET BY MOUTH DAILY.(BETA BLOCKER) 90 tablet 2  . Omega-3 Fatty Acids (FISH OIL) 1000 MG CAPS Take 1 capsule (1,000 mg total) by mouth daily.  0  . ramipril (ALTACE) 5 MG capsule TAKE 1 CAPSULE(5 MG) BY MOUTH DAILY 90 capsule 1  . rosuvastatin (CRESTOR) 20 MG tablet Take 1 tablet (20 mg total) by mouth daily.  90 tablet 2  . zolpidem (AMBIEN) 5 MG tablet Take 1 tablet (5 mg total) by mouth at bedtime as needed. 30 tablet 2   No current facility-administered medications for this visit.      ___________________________________________________________________ Objective   Exam:  BP 138/72   Pulse (!) 54   Temp (!) 97.1 F (36.2 C)   Ht 6' (1.829 m)   Wt 208 lb (94.3 kg)   BMI 28.21 kg/m    General: Well-appearing  Eyes: sclera anicteric, no redness  ENT: oral mucosa moist without lesions, no cervical or supraclavicular lymphadenopathy  CV: RRR without murmur, S1/S2, no JVD, no peripheral edema  Resp: clear to auscultation bilaterally, normal RR and effort noted  GI: soft, no tenderness, with active bowel sounds. No guarding or palpable organomegaly noted.  Skin; warm and dry, no rash or jaundice noted  Neuro: awake, alert and oriented x 3. Normal gross motor function and fluent speech  Labs:  CBC Latest Ref Rng & Units 11/10/2019 05/19/2019 08/23/2018  WBC 4.0 - 10.5 K/uL 6.6 7.0 7.0  Hemoglobin 13.0 - 17.0 g/dL 14.1 14.6 10.1(L)  Hematocrit 39.0 - 52.0 % 41.2 41.7 29.3(L)  Platelets 150.0 - 400.0 K/uL 262.0 233.0 181     Assessment: Encounter Diagnoses  Name Primary?  . Personal history of colonic polyps Yes  . Current use of long term anticoagulation     Based on current  guidelines, due for surveillance colonoscopy.  His health Bryant good overall and a colonoscopy Bryant appropriate for him.  He Bryant on DAPT for coronary disease and reported history of paroxysmal A. fib.  He tells me there was concern for A. fib years ago, he has had a loop recorder in for years with no documented A. fib.  I expect his cardiologist will be agreeable to [facility] being off Plavix 5 days before procedure, and we will communicate with them about this.  He understands the small but real risk of cardiovascular or cerebrovascular event being off Plavix for short period.  He would remain on aspirin during that time. He was agreeable after discussion of procedure and risks.  The benefits and risks of the planned procedure were described in detail with the patient or (when appropriate) their health care proxy.  Risks were outlined as including, but not limited to, bleeding, infection, perforation, adverse medication reaction leading to cardiac or pulmonary decompensation, pancreatitis (if ERCP).  The limitation of incomplete mucosal visualization was also discussed.  No guarantees or warranties were given.   Thank you for the courtesy of this consult.  Please call me with any questions or concerns.  Nelida Meuse III  CC: Referring provider noted above

## 2019-12-22 NOTE — Patient Instructions (Signed)
If you are age 71 or older, your body mass index should be between 23-30. Your Body mass index is 28.21 kg/m. If this is out of the aforementioned range listed, please consider follow up with your Primary Care Provider.  If you are age 84 or younger, your body mass index should be between 19-25. Your Body mass index is 28.21 kg/m. If this is out of the aformentioned range listed, please consider follow up with your Primary Care Provider.   It has been recommended to you by your physician that you have a(n) colonoscopy completed. Per your request, we did not schedule the procedure(s) today. Please contact our office at [phone] should you decide to have the procedure completed. You will be scheduled for a pre-visit and procedure at that time.  You will be contacted by our office prior to your procedure for directions on holding your Plavix.  If you do not hear from our office 1 week prior to your scheduled procedure, please call [phone] to discuss.

## 2019-12-25 ENCOUNTER — Telehealth: Payer: Self-pay

## 2019-12-25 ENCOUNTER — Telehealth: Payer: Self-pay | Admitting: *Deleted

## 2019-12-25 NOTE — Telephone Encounter (Signed)
   Primary Cardiologist: Quay Burow, MD  Chart reviewed as part of pre-operative protocol coverage. There is no cardiac contraindication for holding plavix prior to his colonoscopy, however the patient is on plavix for his history of stroke. Will defer recommendations for holding plavix to the patients PCP/Neurologist.    I will route this recommendation to the requesting party via Epic fax function and remove from pre-op pool.  Please call with questions.  Abigail Butts, PA-C 12/25/2019, 10:54 AM

## 2019-12-25 NOTE — Telephone Encounter (Signed)
This pt is on Plavix- he had an OV 12-22-2019 with Dr Loletha Carrow- He has a colon scheduled for 2-2 He needs Plavix hold from Cardiologist  Thanks, Lelan Pons

## 2019-12-25 NOTE — Telephone Encounter (Signed)
[facility] Pre-operative Risk Assessment     Request for surgical clearance:     Endoscopy Procedure  What type of surgery is being performed?     01-19-2020  When is this surgery scheduled?     Colonoscopy   What type of clearance is required ?   Pharmacy  Are there any medications that need to be held prior to surgery and how long? Plavix, 5 days   Practice name and name of physician performing surgery?      [facility]  What is your office phone and fax number?      Phone- [phone]  [phone]  Anesthesia type (None, local, MAC, general) ?       MAC

## 2019-12-25 NOTE — Telephone Encounter (Signed)
Request has been sent to cardio clearance

## 2019-12-29 ENCOUNTER — Encounter: Payer: Self-pay | Admitting: Internal Medicine

## 2020-01-03 ENCOUNTER — Encounter: Payer: Self-pay | Admitting: Internal Medicine

## 2020-01-05 ENCOUNTER — Other Ambulatory Visit: Payer: Self-pay

## 2020-01-05 ENCOUNTER — Ambulatory Visit (AMBULATORY_SURGERY_CENTER): Payer: Self-pay | Admitting: *Deleted

## 2020-01-05 VITALS — Temp 96.2°F | Ht 72.0 in | Wt 207.6 lb

## 2020-01-05 DIAGNOSIS — Z86010 Personal history of colonic polyps: Secondary | ICD-10-CM

## 2020-01-05 DIAGNOSIS — Z01818 Encounter for other preprocedural examination: Secondary | ICD-10-CM

## 2020-01-05 NOTE — Telephone Encounter (Signed)
Patient notified and aware. He states clear understanding to hold his Plavix for 5 days prior.

## 2020-01-05 NOTE — Telephone Encounter (Signed)
Okay to hold Plavix for 5 days prior to his colonoscopy scheduled 01/19/2020.  Binnie Rail, MD

## 2020-01-05 NOTE — Telephone Encounter (Signed)
Vivien Rota called and gave pt 5 day plavix hold this morning == ok x 5 days per [place] MD- see [facility] PV

## 2020-01-05 NOTE — Telephone Encounter (Signed)
Dr Quay Burow, can you please review the documentation below and advise on holding his Plavix 5 days prior to his scheduled colonoscopy on 01-19-2020 with Dr Loletha Carrow.   Thank-you in advance,   Vivien Rota, [place]

## 2020-01-05 NOTE — Progress Notes (Signed)
No egg or soy allergy known to patient  No issues with past sedation with any surgeries  or procedures, no intubation problems  No diet pills per patient No home 02 use per patient  blood thinners per patient - pt is on Plavix- we do not have Plavix hold as of PV today- cardio said ok but referred to neuro since they perscribed for CVA - no response- pt aware we will call with information about Plavix hold   Pt denies issues with constipation  No A fib or A flutter  EMMI video sent to pt's e mail   Due to the COVID-19 pandemic we are asking patients to follow these guidelines. Please only bring one care partner. Please be aware that your care partner may wait in the car in the parking lot or if they feel like they will be too hot to wait in the car, they may wait in the lobby on the 4th floor. All care partners are required to wear a mask the entire time (we do not have any that we can provide them), they need to practice social distancing, and we will do a Covid check for all patient's and care partners when you arrive. Also we will check their temperature and your temperature. If the care partner waits in their car they need to stay in the parking lot the entire time and we will call them on their cell phone when the patient is ready for discharge so they can bring the car to the front of the building. Also all patient's will need to wear a mask into building.

## 2020-01-06 ENCOUNTER — Ambulatory Visit: Payer: PPO | Attending: Internal Medicine

## 2020-01-06 DIAGNOSIS — Z23 Encounter for immunization: Secondary | ICD-10-CM | POA: Insufficient documentation

## 2020-01-06 NOTE — Progress Notes (Signed)
   Covid-19 Vaccination Clinic  Name:  Alex Bryant    MRN: [id number] DOB: 10/28/1951  01/06/2020  [name] was observed post Covid-19 immunization for 15 minutes without incidence. He was provided with Vaccine Information Sheet and instruction to access the V-Safe system.   [name] was instructed to call 911 with any severe reactions post vaccine: Marland Kitchen Difficulty breathing  . Swelling of your face and throat  . A fast heartbeat  . A bad rash all over your body  . Dizziness and weakness    Immunizations Administered    Name Date Dose VIS Date Route   Pfizer COVID-19 Vaccine 01/06/2020  8:08 AM 0.3 mL 11/27/2019 Intramuscular   Manufacturer: [employer]   Lot: [id number]   [place]: [id number]

## 2020-01-07 ENCOUNTER — Encounter: Payer: Self-pay | Admitting: Gastroenterology

## 2020-01-11 ENCOUNTER — Other Ambulatory Visit: Payer: Self-pay | Admitting: Internal Medicine

## 2020-01-11 ENCOUNTER — Ambulatory Visit (INDEPENDENT_AMBULATORY_CARE_PROVIDER_SITE_OTHER): Payer: PPO | Admitting: *Deleted

## 2020-01-11 DIAGNOSIS — I63413 Cerebral infarction due to embolism of bilateral middle cerebral arteries: Secondary | ICD-10-CM

## 2020-01-11 LAB — CUP PACEART REMOTE DEVICE CHECK
Date Time Interrogation Session: 20210124112321
Implantable Pulse Generator Implant Date: 20171206

## 2020-01-12 ENCOUNTER — Encounter: Payer: Self-pay | Admitting: Internal Medicine

## 2020-01-12 ENCOUNTER — Other Ambulatory Visit: Payer: Self-pay | Admitting: Internal Medicine

## 2020-01-12 LAB — HM DIABETES EYE EXAM

## 2020-01-12 NOTE — Progress Notes (Signed)
Subjective:    Patient ID: Alex Bryant, male    DOB: 02/07/1951, 69 y.o.   MRN: [id number]  HPI The patient is here for an acute visit for elevated BP.   He just had an eye exam and they saw a retina hemorrhage.  His BP in his eye doctor's office was 156/78.  His BP at home today was 168/88.  It has been elevated over the past couple of weeks.  He is compliant with a low sodium diet.  He is currently not exercising, but that has not changed-that really started with Covid.  He denies any recent changes that would have caused his blood pressure to become elevated.  He thinks it has really been elevated for the past couple of weeks.  He does feel a little anxious since knowing his blood pressure is elevated, but denies any other concerning symptoms.   Medications and allergies reviewed with patient and updated if appropriate.  Patient Active Problem List   Diagnosis Date Noted  . Strain of thoracic paraspinal muscles excluding T1 and T2 levels 07/13/2019  . Paroxysmal atrial fibrillation ([place]) 09/08/2018  . 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 CAD (coronary artery disease) 08/14/2018  . Progressive angina ([place]) 08/12/2018  . Vitamin B 12 deficiency 11/21/2017  . Diabetic neuropathy ([place]) 01/22/2017  . Cerebral thrombosis with cerebral infarction 11/20/2016  . History of CVA (cerebrovascular accident) without residual deficits 11/20/2016  . Cerebrovascular accident (CVA) due to bilateral embolism of middle cerebral arteries ([place])   . Plantar fasciitis of right foot 02/17/2014  . Acquired spondylolisthesis 09/29/2013  . 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 Lumbosacral spondylosis without myelopathy 05/25/2013  . Essential hypertension 01/24/2011  . MEDIAL MENISCUS TEAR, RIGHT 07/06/2010  . Diabetes mellitus ([place]) 01/06/2009  . URTICARIA 01/06/2009  . Tubular adenoma of colon 08/26/2008  . Mixed hyperlipidemia 02/24/2008  . History of gout 11/14/2007    . Sleep disorder 09/08/2007  . 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 aspirin EC 81 MG EC tablet Take 1 tablet (81 mg total) by mouth daily.    . clopidogrel (PLAVIX) 75 MG tablet Take 1 tablet by mouth daily. Need office visit for more refills. 90 tablet 0  . glucose blood test strip 1 each by Other route as needed. Use as instructed     . Lancets (FREESTYLE) lancets 1 each by Other route 2 (two) times daily. Use to help check blood sugar twice a day. Dx E11.9 100 each 3  . metFORMIN (GLUCOPHAGE) 1000 MG tablet TAKE 1 TABLET(1000 MG) BY MOUTH TWICE DAILY WITH A MEAL 180 tablet 0  . metoprolol tartrate (LOPRESSOR) 25 MG tablet TAKE 1 TABLET BY MOUTH DAILY.(BETA BLOCKER) 90 tablet 2  . Omega-3 Fatty Acids (FISH OIL) 1000 MG CAPS Take 1 capsule (1,000 mg total) by mouth daily.  0  . ramipril (ALTACE) 5 MG capsule TAKE 1 CAPSULE(5 MG) BY MOUTH DAILY 90 capsule 0  . rosuvastatin (CRESTOR) 20 MG tablet Take 1 tablet (20 mg total) by mouth daily. 90 tablet 2  . zolpidem (AMBIEN) 5 MG tablet Take 1 tablet (5 mg total) by mouth at bedtime as needed. 30 tablet 2   No current facility-administered medications on file prior to visit.    Past Medical History:  Diagnosis Date  . Allergy   . Coronary artery disease   .  Diet-controlled diabetes mellitus ([place])    AODM  . History of gout   . Hyperlipidemia   . Hypertension   . Progressive angina ([place]) 08/12/2018   Chest pain  . Stroke ([place]) 11/19/2016   "fully recovered" (08/14/2018)  . Tick bite [phone]   Erlichiosis;Eulis Canner MD, ID    Past Surgical History:  Procedure Laterality Date  . BACK SURGERY    . COLONOSCOPY    . COLONOSCOPY W/ POLYPECTOMY  2010; 2015   Dr Olevia Perches  . CORONARY ARTERY BYPASS GRAFT N/A 08/19/2018   Procedure: CORONARY ARTERY BYPASS GRAFTING (CABG) x 5  USING LEFT INTERNAL MAMMARY ARTERY AND ENDOSCOPICALLY HARVESTED  RIGHT SAPHENOUS VEIN.;  Surgeon: Ivin Poot, MD;  Location: [place];  Service: Open Heart Surgery;  Laterality: N/A;  . EP IMPLANTABLE DEVICE N/A 11/21/2016   Procedure: Loop Recorder Insertion;  Surgeon: Thompson Grayer, MD;  Location: [place] [facility];  Service: Cardiovascular;  Laterality: N/A;  . ESI  2014   @L5 -S1 X3   . EXCISIONAL HEMORRHOIDECTOMY  ~ 1995  . FACIAL RECONSTRUCTION SURGERY Right 1990s   plating & pinning of maxilla  . INGUINAL HERNIA REPAIR Bilateral 1959  . LEFT HEART CATH AND CORONARY ANGIOGRAPHY N/A 08/14/2018   Procedure: LEFT HEART CATH AND CORONARY ANGIOGRAPHY;  Surgeon: Lorretta Harp, MD;  Location: [place] [facility];  Service: Cardiovascular;  Laterality: N/A;  . POLYPECTOMY    . POSTERIOR LUMBAR FUSION  10/17/2013   L5-S1 ; [place]  . TEE WITHOUT CARDIOVERSION N/A 11/21/2016   Procedure: TRANSESOPHAGEAL ECHOCARDIOGRAM (TEE);  Surgeon: Larey Dresser, MD;  Location: [place];  Service: Cardiovascular;  Laterality: N/A;  . TEE WITHOUT CARDIOVERSION N/A 08/19/2018   Procedure: TRANSESOPHAGEAL ECHOCARDIOGRAM (TEE);  Surgeon: Prescott Gum, Collier Salina, MD;  Location: [place];  Service: Open Heart Surgery;  Laterality: N/A;    Social History   Socioeconomic History  . Marital status: Married    Spouse name: Not on file  . Number of children: Not on file  . Years of education: Not on file  . Highest education level: Not on file  Occupational History  . Not on file  Tobacco Use  . Smoking status: Former Smoker    Packs/day: 0.25    Years: 6.00    Pack years: 1.50    Types: Cigarettes    Quit date: 12/17/1980    Years since quitting: 39.0  . Smokeless tobacco: Never Used  . Tobacco comment: smoked off and on 1966- 1982, up to < 5 cigarettes / day  Substance and Sexual Activity  . Alcohol use: Yes    Alcohol/week: 4.0 standard drinks    Types: 4 Shots of liquor per week    Comment:  socially  . Drug use: Never  . Sexual activity: Yes  Other  Topics Concern  . Not on file  Social History Narrative  . Not on file   Social Determinants of Health   Financial Resource Strain:   . Difficulty of Paying Living Expenses: Not on file  Food Insecurity:   . Worried About Charity fundraiser in the Last Year: Not on file  . Ran Out of Food in the Last Year: Not on file  Transportation Needs:   . Lack of Transportation (Medical): Not on file  . Lack of Transportation (Non-Medical): Not on file  Physical Activity:   . Days of Exercise per Week: Not on file  . Minutes of Exercise per Session: Not  on file  Stress:   . Feeling of Stress : Not on file  Social Connections:   . Frequency of Communication with Friends and Family: Not on file  . Frequency of Social Gatherings with Friends and Family: Not on file  . Attends Religious Services: Not on file  . Active Member of Clubs or Organizations: Not on file  . Attends Archivist Meetings: Not on file  . Marital Status: Not on file    Family History  Problem Relation Age of Onset  . Heart attack Father 58  . Hypertension Mother   . Stroke Mother        X27  . Diabetes Sister        "pre Diabetes"  . Stroke Maternal Uncle   . Cancer Neg Hx   . Colon cancer Neg Hx   . Rectal cancer Neg Hx   . Stomach cancer Neg Hx   . Colon polyps Neg Hx   . Esophageal cancer Neg Hx     Review of Systems  Constitutional: Negative for fever.  Respiratory: Negative for cough, shortness of breath and wheezing.   Cardiovascular: Negative for chest pain, palpitations and leg swelling.  Neurological: Negative for dizziness, light-headedness and headaches.  Psychiatric/Behavioral: The patient is nervous/anxious (since BP has been elevated).        Objective:   Vitals:   01/13/20 1514  BP: (!) 172/84  Pulse: 62  Resp: 16  Temp: 98.3 F (36.8 C)  SpO2: 97%   BP Readings from Last 3 Encounters:  01/13/20 (!) 172/84  12/22/19 138/72  11/10/19 (!) 142/72   Wt Readings from  Last 3 Encounters:  01/13/20 210 lb 12.8 oz (95.6 kg)  01/05/20 207 lb 9.6 oz (94.2 kg)  12/22/19 208 lb (94.3 kg)   Body mass index is 28.59 kg/m.   Physical Exam    Constitutional: Appears well-developed and well-nourished. No distress.  Head: Normocephalic and atraumatic.  Neck: Neck supple. No tracheal deviation present. No thyromegaly present.  No cervical lymphadenopathy Cardiovascular: Normal rate, regular rhythm and normal heart sounds.  No murmur heard. No carotid bruit .  No edema Pulmonary/Chest: Effort normal and breath sounds normal. No respiratory distress. No has no wheezes. No rales.  Skin: Skin is warm and dry. Not diaphoretic.  Psychiatric: Normal mood and affect. Behavior is normal.       Assessment & Plan:    See Problem List for Assessment and Plan of chronic medical problems.

## 2020-01-13 ENCOUNTER — Encounter: Payer: Self-pay | Admitting: Internal Medicine

## 2020-01-13 ENCOUNTER — Ambulatory Visit (INDEPENDENT_AMBULATORY_CARE_PROVIDER_SITE_OTHER): Payer: PPO | Admitting: Internal Medicine

## 2020-01-13 ENCOUNTER — Other Ambulatory Visit: Payer: Self-pay

## 2020-01-13 DIAGNOSIS — H35033 Hypertensive retinopathy, bilateral: Secondary | ICD-10-CM | POA: Diagnosis not present

## 2020-01-13 DIAGNOSIS — I10 Essential (primary) hypertension: Secondary | ICD-10-CM | POA: Diagnosis not present

## 2020-01-13 DIAGNOSIS — H35039 Hypertensive retinopathy, unspecified eye: Secondary | ICD-10-CM | POA: Insufficient documentation

## 2020-01-13 MED ORDER — RAMIPRIL 5 MG PO CAPS: 10.0000 mg | ORAL_CAPSULE | Freq: Every day | ORAL | 0 refills | Status: DC

## 2020-01-13 MED ORDER — METOPROLOL TARTRATE 25 MG PO TABS: 25.0000 mg | ORAL_TABLET | Freq: Two times a day (BID) | ORAL | 2 refills | Status: DC

## 2020-01-13 NOTE — Patient Instructions (Addendum)
  Medications reviewed and updated.  Changes include :   Increase metoprolol to 25 mg twice daily and increase ramipril to 10 mg daily      Please followup in 2 weeks

## 2020-01-13 NOTE — Assessment & Plan Note (Signed)
Blood pressure has been uncontrolled for at least the past 2 weeks-no obvious cause Not currently exercising, but we will try to start when stable Compliant with low-sodium diet Taking metoprolol once daily-increase to twice daily Increase ramipril to 10 mg daily He will monitor his BP at home and update me via MyChart He will follow up in 2 weeks, sooner if needed

## 2020-01-13 NOTE — Assessment & Plan Note (Signed)
Mild hypertensive right neuropathy bilaterally, retinal flame hemorrhage in the right eye Last visit 01/12/2020 Has follow-up in 6 weeks We will get blood pressure better controlled

## 2020-01-14 ENCOUNTER — Other Ambulatory Visit: Payer: Self-pay | Admitting: Gastroenterology

## 2020-01-14 ENCOUNTER — Ambulatory Visit (INDEPENDENT_AMBULATORY_CARE_PROVIDER_SITE_OTHER): Payer: Self-pay

## 2020-01-14 DIAGNOSIS — Z1159 Encounter for screening for other viral diseases: Secondary | ICD-10-CM

## 2020-01-14 LAB — SARS CORONAVIRUS 2 (TAT 6-24 HRS): SARS Coronavirus 2: NEGATIVE

## 2020-01-19 ENCOUNTER — Other Ambulatory Visit: Payer: Self-pay | Admitting: Internal Medicine

## 2020-01-19 ENCOUNTER — Ambulatory Visit (AMBULATORY_SURGERY_CENTER): Payer: PPO | Admitting: Gastroenterology

## 2020-01-19 ENCOUNTER — Encounter: Payer: Self-pay | Admitting: Gastroenterology

## 2020-01-19 ENCOUNTER — Other Ambulatory Visit: Payer: Self-pay

## 2020-01-19 VITALS — BP 128/68 | HR 57 | Temp 95.9°F | Resp 21 | Ht 72.0 in | Wt 207.0 lb

## 2020-01-19 DIAGNOSIS — D122 Benign neoplasm of ascending colon: Secondary | ICD-10-CM

## 2020-01-19 DIAGNOSIS — D123 Benign neoplasm of transverse colon: Secondary | ICD-10-CM

## 2020-01-19 DIAGNOSIS — Z86010 Personal history of colonic polyps: Secondary | ICD-10-CM | POA: Diagnosis not present

## 2020-01-19 MED ORDER — SODIUM CHLORIDE 0.9 % IV SOLN: 500.0000 mL | Freq: Once | INTRAVENOUS | Status: DC

## 2020-01-19 MED ORDER — HYDROCHLOROTHIAZIDE 25 MG PO TABS: 25.0000 mg | ORAL_TABLET | Freq: Every day | ORAL | 3 refills | Status: DC

## 2020-01-19 NOTE — Op Note (Signed)
[facility] Patient Name: Alex Bryant Procedure Date: 01/19/2020 7:58 AM MRN: [id number] Endoscopist: Richards. Loletha Carrow , MD Age: 69 Referring MD:  Date of Birth: 04-24-1951 Gender: Male Account #: [id number] Procedure:                Colonoscopy Indications:              Surveillance: Personal history of adenomatous                            polyps on last colonoscopy > 5 years ago (10/2014                            <81mm cecal TA, diminutive Asc colon SSP) Medicines:                Monitored Anesthesia Care Procedure:                Pre-Anesthesia Assessment:                           - Prior to the procedure, a History and Physical                            was performed, and patient medications and                            allergies were reviewed. The patient's tolerance of                            previous anesthesia was also reviewed. The risks                            and benefits of the procedure and the sedation                            options and risks were discussed with the patient.                            All questions were answered, and informed consent                            was obtained. Prior Anticoagulants: The patient has                            taken Plavix (clopidogrel), last dose was 5 days                            prior to procedure (remained on aspirin). ASA Grade                            Assessment: III - A patient with severe systemic                            disease. After reviewing the risks and benefits,  the patient was deemed in satisfactory condition to                            undergo the procedure.                           After obtaining informed consent, the colonoscope                            was passed under direct vision. Throughout the                            procedure, the patient's blood pressure, pulse, and                            oxygen saturations were monitored  continuously. The                            Colonoscope was introduced through the anus and                            advanced to the the cecum, identified by                            appendiceal orifice and ileocecal valve. The                            colonoscopy was performed without difficulty. The                            patient tolerated the procedure well. The quality                            of the bowel preparation was excellent. The                            ileocecal valve, appendiceal orifice, and rectum                            were photographed. The quality of the bowel                            preparation was evaluated using the BBPS [facility]                            Bowel Preparation Scale) with scores of: Right                            Colon = 3, Transverse Colon = 3 and Left Colon = 3.                            The total BBPS score equals 9. The bowel  preparation used was Plenvu. Scope In: 8:09:27 AM Scope Out: 8:43:24 AM Scope Withdrawal Time: 0 hours 30 minutes 36 seconds  Total Procedure Duration: 0 hours 33 minutes 57 seconds  Findings:                 The perianal and digital rectal examinations were                            normal.                           Four sessile polyps were found in the transverse                            colon and ascending colon. The polyps were                            diminutive in size. These polyps were removed with                            a cold snare. Resection and retrieval were complete.                           A diminutive polyp was found in the distal                            transverse colon. The polyp was sessile. The polyp                            was removed with a cold biopsy forceps. Resection                            and retrieval were complete.                           Multiple diverticula were found in the left colon.                           The exam  was otherwise without abnormality on                            direct and retroflexion views. Complications:            No immediate complications. Estimated Blood Loss:     Estimated blood loss was minimal. Impression:               - Four diminutive polyps in the transverse colon                            and in the ascending colon, removed with a cold                            snare. Resected and retrieved.                           -  One diminutive polyp in the distal transverse                            colon, removed with a cold biopsy forceps. Resected                            and retrieved.                           - Diverticulosis in the left colon.                           - The examination was otherwise normal on direct                            and retroflexion views. Recommendation:           - Patient has a contact number available for                            emergencies. The signs and symptoms of potential                            delayed complications were discussed with the                            patient. Return to normal activities tomorrow.                            Written discharge instructions were provided to the                            patient.                           - Resume previous diet.                           - Resume Plavix (clopidogrel) at prior dose                            tomorrow.                           - Await pathology results.                           - Repeat colonoscopy is recommended for                            surveillance. The colonoscopy date will be                            determined after pathology results from today's                            exam become available for review. Mallie Mussel  Laurian Brim, MD 01/19/2020 8:49:14 AM This report has been signed electronically.

## 2020-01-19 NOTE — Progress Notes (Signed)
Called to room to assist during endoscopic procedure.  Patient ID and intended procedure confirmed with present staff. Received instructions for my participation in the procedure from the performing physician.Called to room to assist during endoscopic procedure.  Patient ID and intended procedure confirmed with present staff. Received instructions for my participation in the procedure from the performing physician. 

## 2020-01-19 NOTE — Patient Instructions (Signed)
YOU HAD AN ENDOSCOPIC PROCEDURE TODAY AT THE [facility]:   Refer to the procedure report that was given to you for any specific questions about what was found during the examination.  If the procedure report does not answer your questions, please call your gastroenterologist to clarify.  If you requested that your care partner not be given the details of your procedure findings, then the procedure report has been included in a sealed envelope for you to review at your convenience later.  YOU SHOULD EXPECT: Some feelings of bloating in the abdomen. Passage of more gas than usual.  Walking can help get rid of the air that was put into your GI tract during the procedure and reduce the bloating. If you had a lower endoscopy (such as a colonoscopy or flexible sigmoidoscopy) you may notice spotting of blood in your stool or on the toilet paper. If you underwent a bowel prep for your procedure, you may not have a normal bowel movement for a few days.  Please Note:  You might notice some irritation and congestion in your nose or some drainage.  This is from the oxygen used during your procedure.  There is no need for concern and it should clear up in a day or so.  SYMPTOMS TO REPORT IMMEDIATELY:   Following lower endoscopy (colonoscopy or flexible sigmoidoscopy):  Excessive amounts of blood in the stool  Significant tenderness or worsening of abdominal pains  Swelling of the abdomen that is new, acute  Fever of 100F or higher  For urgent or emergent issues, a gastroenterologist can be reached at any hour by calling [phone].   DIET:  We do recommend a small meal at first, but then you may proceed to your regular diet.  Drink plenty of fluids but you should avoid alcoholic beverages for 24 hours.  ACTIVITY:  You should plan to take it easy for the rest of today and you should NOT DRIVE or use heavy machinery until tomorrow (because of the sedation medicines used during the test).     FOLLOW UP: Our staff will call the number listed on your records 48-72 hours following your procedure to check on you and address any questions or concerns that you may have regarding the information given to you following your procedure. If we do not reach you, we will leave a message.  We will attempt to reach you two times.  During this call, we will ask if you have developed any symptoms of COVID 19. If you develop any symptoms (ie: fever, flu-like symptoms, shortness of breath, cough etc.) before then, please call [phone].  If you test positive for Covid 19 in the 2 weeks post procedure, please call and report this information to us.    If any biopsies were taken you will be contacted by phone or by letter within the next 1-3 weeks.  Please call us at [phone] if you have not heard about the biopsies in 3 weeks.    SIGNATURES/CONFIDENTIALITY: You and/or your care partner have signed paperwork which will be entered into your electronic medical record.  These signatures attest to the fact that that the information above on your After Visit Summary has been reviewed and is understood.  Full responsibility of the confidentiality of this discharge information lies with you and/or your care-partner. 

## 2020-01-19 NOTE — Progress Notes (Signed)
PT taken to PACU. Monitors in place. VSS. Report given to RN. 

## 2020-01-19 NOTE — Progress Notes (Signed)
Pt's states no medical or surgical changes since previsit or office visit. 

## 2020-01-21 ENCOUNTER — Telehealth: Payer: Self-pay

## 2020-01-21 ENCOUNTER — Encounter: Payer: Self-pay | Admitting: Gastroenterology

## 2020-01-21 NOTE — Telephone Encounter (Signed)
  Follow up Call-  Call back number 01/19/2020  Post procedure Call Back phone  # [phone]  Permission to leave phone message Yes  Some recent data might be hidden     Patient questions:  Do you have a fever, pain , or abdominal swelling? No. Pain Score  0 *  Have you tolerated food without any problems? Yes.    Have you been able to return to your normal activities? Yes.    Do you have any questions about your discharge instructions: Diet   No. Medications  No. Follow up visit  No.  Do you have questions or concerns about your Care? No.  Actions: * If pain score is 4 or above: No action needed, pain <4.  1. Have you developed a fever since your procedure? no  2.   Have you had an respiratory symptoms (SOB or cough) since your procedure? no  3.   Have you tested positive for COVID 19 since your procedure no  4.   Have you had any family members/close contacts diagnosed with the COVID 19 since your procedure?  no   If yes to any of these questions please route to Joylene John, RN and Alphonsa Gin, Therapist, sports.

## 2020-01-21 NOTE — Telephone Encounter (Signed)
First attempt follow up call to pt, lm on vm 

## 2020-01-27 ENCOUNTER — Ambulatory Visit: Payer: PPO | Attending: Internal Medicine

## 2020-01-27 DIAGNOSIS — Z23 Encounter for immunization: Secondary | ICD-10-CM

## 2020-01-27 NOTE — Progress Notes (Signed)
   Covid-19 Vaccination Clinic  Name:  Alex Bryant    MRN: [id number] DOB: 06-16-1951  01/27/2020  Alex Bryant was observed post Covid-19 immunization for 15 minutes without incidence. He was provided with Vaccine Information Sheet and instruction to access the V-Safe system.   Alex Bryant was instructed to call 911 with any severe reactions post vaccine: Marland Kitchen Difficulty breathing  . Swelling of your face and throat  . A fast heartbeat  . A bad rash all over your body  . Dizziness and weakness    Immunizations Administered    Name Date Dose VIS Date Route   Pfizer COVID-19 Vaccine 01/27/2020  9:27 AM 0.3 mL 11/27/2019 Intramuscular   Manufacturer: [employer]   Lot: [id number]   [place]: [id number]

## 2020-02-08 ENCOUNTER — Ambulatory Visit: Payer: PPO

## 2020-02-11 ENCOUNTER — Ambulatory Visit (INDEPENDENT_AMBULATORY_CARE_PROVIDER_SITE_OTHER): Payer: PPO | Admitting: *Deleted

## 2020-02-11 DIAGNOSIS — I6330 Cerebral infarction due to thrombosis of unspecified cerebral artery: Secondary | ICD-10-CM

## 2020-02-11 LAB — CUP PACEART REMOTE DEVICE CHECK
Date Time Interrogation Session: 20210224232018
Implantable Pulse Generator Implant Date: 20171206

## 2020-02-12 NOTE — Progress Notes (Signed)
ILR Remote 

## 2020-02-18 ENCOUNTER — Encounter: Payer: Self-pay | Admitting: Internal Medicine

## 2020-02-18 DIAGNOSIS — J019 Acute sinusitis, unspecified: Secondary | ICD-10-CM | POA: Diagnosis not present

## 2020-02-18 DIAGNOSIS — E119 Type 2 diabetes mellitus without complications: Secondary | ICD-10-CM | POA: Diagnosis not present

## 2020-02-18 MED ORDER — ZOLPIDEM TARTRATE 5 MG PO TABS: 5.0000 mg | ORAL_TABLET | Freq: Every evening | ORAL | 2 refills | Status: DC | PRN

## 2020-02-23 DIAGNOSIS — H40011 Open angle with borderline findings, low risk, right eye: Secondary | ICD-10-CM | POA: Diagnosis not present

## 2020-02-23 DIAGNOSIS — D3132 Benign neoplasm of left choroid: Secondary | ICD-10-CM | POA: Diagnosis not present

## 2020-02-23 DIAGNOSIS — E113293 Type 2 diabetes mellitus with mild nonproliferative diabetic retinopathy without macular edema, bilateral: Secondary | ICD-10-CM | POA: Diagnosis not present

## 2020-02-23 DIAGNOSIS — H35033 Hypertensive retinopathy, bilateral: Secondary | ICD-10-CM | POA: Diagnosis not present

## 2020-02-23 DIAGNOSIS — H25813 Combined forms of age-related cataract, bilateral: Secondary | ICD-10-CM | POA: Diagnosis not present

## 2020-02-24 DIAGNOSIS — J019 Acute sinusitis, unspecified: Secondary | ICD-10-CM | POA: Diagnosis not present

## 2020-03-08 ENCOUNTER — Encounter: Payer: Self-pay | Admitting: Internal Medicine

## 2020-03-08 ENCOUNTER — Telehealth: Payer: Self-pay | Admitting: Internal Medicine

## 2020-03-08 MED ORDER — RAMIPRIL 5 MG PO CAPS: 10.0000 mg | ORAL_CAPSULE | Freq: Every day | ORAL | 0 refills | Status: DC

## 2020-03-08 NOTE — Telephone Encounter (Signed)
New message:   1.Medication Requested: ramipril (ALTACE) 5 MG capsule 2. Pharmacy (Name, [place]): [facility] [place], [address] AT [place] 3. On Med List: Yes  4. Last Visit with PCP: 01/13/20  5. Next visit date with PCP:  Pt states he is currently all the way out of this medication and states that [facility] also sent [place] an request but has not heard anything back from [place] in regards to this refill request. Agent: Please be advised that RX refills may take up to 3 business days. We ask that you follow-up with your pharmacy.

## 2020-03-08 NOTE — Telephone Encounter (Signed)
Rx sent 

## 2020-03-09 MED ORDER — RAMIPRIL 5 MG PO CAPS: 10.0000 mg | ORAL_CAPSULE | Freq: Every day | ORAL | 1 refills | Status: DC

## 2020-03-09 NOTE — Telephone Encounter (Signed)
Appointment scheduled in regards.

## 2020-03-09 NOTE — Progress Notes (Signed)
Subjective:    Patient ID: Alex Bryant, male    DOB: [birth year], 69 y.o.   MRN: [id number]  HPI The patient is here for an acute visit.  Soon after his second covid vaccine he started having a sinus headache.  That has persisted for a month.  At one point it was severe and he went to urgent care.  He received an injection and prednisone taper at urgent care with some improvement x 2 days only.  The headache came back and he has had it daily since then.  The headahce gets better at night.  He does not wake up with the haeaches.  In the afternoon 2pm - 7pm - it is almost unbearable.  It has been so bad that he is thought about going to the emergency room.  He was almost nauseous the other night and did not want to eat.  The pain is in the cubital, ethmoidal region.  He has some discomfort in the maxillary region.  He denies fevers, congestion, sore throat, ear pain or pressure.  He has tried Afrin, nasacort, advil, aleve.  They helped minimally.  He uses a netti pot which helps.     Medications and allergies reviewed with patient and updated if appropriate.  Patient Active Problem List   Diagnosis Date Noted  . Hypertensive retinopathy 01/13/2020  . Strain of thoracic paraspinal muscles excluding T1 and T2 levels 07/13/2019  . Paroxysmal atrial fibrillation ([place]) 09/08/2018  . 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 CAD (coronary artery disease) 08/14/2018  . Progressive angina ([place]) 08/12/2018  . Vitamin B 12 deficiency 11/21/2017  . Diabetic neuropathy ([place]) 01/22/2017  . Cerebral thrombosis with cerebral infarction 11/20/2016  . History of CVA (cerebrovascular accident) without residual deficits 11/20/2016  . 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 Plantar fasciitis of right foot 02/17/2014  . Acquired spondylolisthesis 09/29/2013  . 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 Lumbosacral spondylosis  without myelopathy 05/25/2013  . Uncontrolled hypertension 01/24/2011  . MEDIAL MENISCUS TEAR, RIGHT 07/06/2010  . Diabetes mellitus ([place]) 01/06/2009  . URTICARIA 01/06/2009  . Tubular adenoma of colon 08/26/2008  . Mixed hyperlipidemia 02/24/2008  . History of gout 11/14/2007  . Sleep disorder 09/08/2007  . 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 aspirin EC 81 MG EC tablet Take 1 tablet (81 mg total) by mouth daily.    . clopidogrel (PLAVIX) 75 MG tablet Take 1 tablet by mouth daily. 90 tablet 0  . glucose blood test strip 1 each by Other route as needed. Use as instructed     . hydrochlorothiazide (HYDRODIURIL) 25 MG tablet Take 1 tablet (25 mg total) by mouth daily. 30 tablet 3  . Lancets (FREESTYLE) lancets 1 each by Other route 2 (two) times daily. Use to help check blood sugar twice a day. Dx E11.9 100 each 3  . metFORMIN (GLUCOPHAGE) 1000 MG tablet TAKE 1 TABLET(1000 MG) BY MOUTH TWICE DAILY WITH A MEAL 180 tablet 0  . metoprolol tartrate (LOPRESSOR) 25 MG tablet Take 1 tablet (25 mg total) by mouth 2 (two) times daily. 180 tablet 2  . Omega-3 Fatty Acids (FISH OIL) 1000 MG CAPS Take 1 capsule (1,000 mg total) by mouth daily.  0  . rosuvastatin (CRESTOR) 20 MG tablet Take 1 tablet (20 mg  total) by mouth daily. 90 tablet 2  . zolpidem (AMBIEN) 5 MG tablet Take 1 tablet (5 mg total) by mouth at bedtime as needed. 30 tablet 2   No current facility-administered medications on file prior to visit.    Past Medical History:  Diagnosis Date  . Allergy   . Coronary artery disease   . Diet-controlled diabetes mellitus ([place])    AODM  . History of gout   . Hyperlipidemia   . Hypertension   . Progressive angina ([place]) 08/12/2018   Chest pain  . Stroke ([place]) 11/19/2016   "fully recovered" (08/14/2018)  . 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 BACK SURGERY    . COLONOSCOPY    . COLONOSCOPY W/ POLYPECTOMY  2010; 2015   Dr Olevia Perches  . CORONARY ARTERY BYPASS GRAFT N/A 08/19/2018   Procedure: CORONARY ARTERY BYPASS GRAFTING (CABG) x 5  USING LEFT INTERNAL MAMMARY ARTERY AND ENDOSCOPICALLY HARVESTED RIGHT SAPHENOUS VEIN.;  Surgeon: Ivin Poot, MD;  Location: [facility];  Service: Open Heart Surgery;  Laterality: N/A;  . EP IMPLANTABLE DEVICE N/A 11/21/2016   Procedure: Loop Recorder Insertion;  Surgeon: Thompson Grayer, MD;  Location: [facility];  Service: Cardiovascular;  Laterality: N/A;  . ESI  2014   @L5 -S1 X3   . EXCISIONAL HEMORRHOIDECTOMY  ~ 1995  . FACIAL RECONSTRUCTION SURGERY Right 1990s   plating & pinning of maxilla  . INGUINAL HERNIA REPAIR Bilateral 1959  . LEFT HEART CATH AND CORONARY ANGIOGRAPHY N/A 08/14/2018   Procedure: LEFT HEART CATH AND CORONARY ANGIOGRAPHY;  Surgeon: Lorretta Harp, MD;  Location: [facility];  Service: Cardiovascular;  Laterality: N/A;  . POLYPECTOMY    . POSTERIOR LUMBAR FUSION  10/17/2013   L5-S1 ; [facility]  . TEE WITHOUT CARDIOVERSION N/A 11/21/2016   Procedure: TRANSESOPHAGEAL ECHOCARDIOGRAM (TEE);  Surgeon: Larey Dresser, MD;  Location: [facility];  Service: Cardiovascular;  Laterality: N/A;  . TEE WITHOUT CARDIOVERSION N/A 08/19/2018   Procedure: TRANSESOPHAGEAL ECHOCARDIOGRAM (TEE);  Surgeon: Prescott Gum, Collier [place]ina, MD;  Location: [facility];  Service: Open Heart Surgery;  Laterality: N/A;    Social History   Socioeconomic History  . Marital status: Married    Spouse name: Not on file  . Number of children: Not on file  . Years of education: Not on file  . Highest education level: Not on file  Occupational History  . Not on file  Tobacco Use  . Smoking status: Former Smoker    Packs/day: 0.25    Years: 6.00    Pack years: 1.50    Types: Cigarettes    Quit date: 12/17/1980    Years since quitting: 39.2  . Smokeless tobacco: Never  Used  . Tobacco comment: smoked off and on 1966- 1982, up to < 5 cigarettes / day  Substance and Sexual Activity  . Alcohol use: Yes    Alcohol/week: 4.0 standard drinks    Types: 4 Shots of liquor per week    Comment:  socially  . Drug use: Never  . Sexual activity: Yes  Other Topics Concern  . Not on file  Social History Narrative  . Not on file   Social Determinants of Health   Financial Resource Strain:   . Difficulty of Paying Living Expenses:   Food Insecurity:   . Worried About Charity fundraiser in the Last Year:   .  Ran Out of Food in the Last Year:   Transportation Needs:   . Film/video editor (Medical):   Marland Kitchen Lack of Transportation (Non-Medical):   Physical Activity:   . Days of Exercise per Week:   . Minutes of Exercise per Session:   Stress:   . Feeling of Stress :   Social Connections:   . Frequency of Communication with Friends and Family:   . Frequency of Social Gatherings with Friends and Family:   . Attends Religious Services:   . Active Member of Clubs or Organizations:   . Attends Archivist Meetings:   Marland Kitchen Marital Status:     Family History  Problem Relation Age of Onset  . Heart attack Father 37  . Hypertension Mother   . Stroke Mother        X35  . Diabetes Sister        "pre Diabetes"  . Stroke Maternal Uncle   . Cancer Neg Hx   . Colon cancer Neg Hx   . Rectal cancer Neg Hx   . Stomach cancer Neg Hx   . Colon polyps Neg Hx   . Esophageal cancer Neg Hx     Review of Systems  Constitutional: Negative for chills and fever.  HENT: Positive for sinus pain. Negative for congestion, ear pain, postnasal drip and sore throat.   Eyes: Negative for visual disturbance.  Neurological: Positive for headaches. Negative for dizziness and light-headedness.       Objective:   Vitals:   03/10/20 0823  BP: 102/60  Pulse: 72  Resp: 16  Temp: 98.1 F (36.7 C)  SpO2: 98%   BP Readings from Last 3 Encounters:  03/10/20 102/60   01/19/20 128/68  01/13/20 (!) 172/84   Wt Readings from Last 3 Encounters:  03/10/20 206 lb 12.8 oz (93.8 kg)  01/19/20 207 lb (93.9 kg)  01/13/20 210 lb 12.8 oz (95.6 kg)   Body mass index is 28.05 kg/m.   Physical Exam    GENERAL APPEARANCE: Appears stated age, well appearing, NAD EYES: conjunctiva clear, no icterus HEENT: bilateral tympanic membranes and ear canals normal, oropharynx with no erythema, some sinus pressure with palpation, no thyromegaly, trachea midline, no cervical or supraclavicular lymphadenopathy LUNGS: Clear to auscultation without wheeze or crackles, unlabored breathing, good air entry bilaterally CARDIOVASCULAR: Normal S1,S2 without murmurs, no edema SKIN: Warm, dry      Assessment & Plan:    See Problem List for Assessment and Plan of chronic medical problems.    This visit occurred during the SARS-CoV-2 public health emergency.  Safety protocols were in place, including screening questions prior to the visit, additional usage of staff PPE, and extensive cleaning of exam room while observing appropriate contact time as indicated for disinfecting solutions.

## 2020-03-09 NOTE — Telephone Encounter (Signed)
Resent

## 2020-03-09 NOTE — Telephone Encounter (Signed)
   Patient calling states pharmacy still does not have refill request

## 2020-03-09 NOTE — Addendum Note (Signed)
Addended by: Delice Bison E on: 03/09/2020 03:01 PM   Modules accepted: Orders

## 2020-03-10 ENCOUNTER — Encounter: Payer: Self-pay | Admitting: Internal Medicine

## 2020-03-10 ENCOUNTER — Ambulatory Visit (INDEPENDENT_AMBULATORY_CARE_PROVIDER_SITE_OTHER): Payer: PPO | Admitting: Internal Medicine

## 2020-03-10 ENCOUNTER — Other Ambulatory Visit: Payer: Self-pay

## 2020-03-10 VITALS — BP 102/60 | HR 72 | Temp 98.1°F | Resp 16 | Ht 72.0 in | Wt 206.8 lb

## 2020-03-10 DIAGNOSIS — J322 Chronic ethmoidal sinusitis: Secondary | ICD-10-CM | POA: Diagnosis not present

## 2020-03-10 MED ORDER — CEFTRIAXONE SODIUM 1 G IJ SOLR
1.0000 g | Freq: Once | INTRAMUSCULAR | Status: AC
  Administered 2020-03-10: 1 g via INTRAMUSCULAR

## 2020-03-10 MED ORDER — RAMIPRIL 5 MG PO CAPS: 5.0000 mg | ORAL_CAPSULE | Freq: Every day | ORAL | 1 refills | Status: DC

## 2020-03-10 MED ORDER — AMOXICILLIN-POT CLAVULANATE 875-125 MG PO TABS: 1.0000 | ORAL_TABLET | Freq: Two times a day (BID) | ORAL | 0 refills | Status: DC

## 2020-03-10 NOTE — Assessment & Plan Note (Signed)
Subacute Symptoms consistent with possible chronic ethmoidal sinus infection Discussed treatment with an antibiotic despite this not presenting like a typical sinus infection versus getting CT of the sinuses and possibly ENT evaluation He is interested in trying the antibiotic first and would like to try to hit this hard to get this to go away as quickly as possible Ceftriaxone 2 g IM x1 today Start Augmentin twice daily x14 days tomorrow He will continue over-the-counter remedies If there is no improvement over the next few days we will pursue a CT of the sinuses and possible ENT evaluation He agrees with plan

## 2020-03-10 NOTE — Patient Instructions (Addendum)
You received an antibiotic injection.  Start the antibiotic tomorrow.     Decrease ramipril to 5 mg once a day.   Your prescription(s) have been submitted to your pharmacy. Please take as directed and contact our office if you believe you are having problem(s) with the medication(s).

## 2020-03-13 ENCOUNTER — Encounter: Payer: Self-pay | Admitting: Internal Medicine

## 2020-03-13 DIAGNOSIS — R519 Headache, unspecified: Secondary | ICD-10-CM

## 2020-03-14 ENCOUNTER — Ambulatory Visit (INDEPENDENT_AMBULATORY_CARE_PROVIDER_SITE_OTHER): Payer: PPO | Admitting: *Deleted

## 2020-03-14 DIAGNOSIS — I6330 Cerebral infarction due to thrombosis of unspecified cerebral artery: Secondary | ICD-10-CM

## 2020-03-14 LAB — CUP PACEART REMOTE DEVICE CHECK
Date Time Interrogation Session: 20210328015521
Implantable Pulse Generator Implant Date: 20171206

## 2020-03-15 ENCOUNTER — Other Ambulatory Visit: Payer: Self-pay | Admitting: Internal Medicine

## 2020-03-15 ENCOUNTER — Encounter: Payer: Self-pay | Admitting: Internal Medicine

## 2020-03-15 ENCOUNTER — Other Ambulatory Visit: Payer: Self-pay

## 2020-03-15 ENCOUNTER — Other Ambulatory Visit (INDEPENDENT_AMBULATORY_CARE_PROVIDER_SITE_OTHER): Payer: PPO

## 2020-03-15 DIAGNOSIS — E782 Mixed hyperlipidemia: Secondary | ICD-10-CM | POA: Diagnosis not present

## 2020-03-15 DIAGNOSIS — I10 Essential (primary) hypertension: Secondary | ICD-10-CM

## 2020-03-15 DIAGNOSIS — G4489 Other headache syndrome: Secondary | ICD-10-CM

## 2020-03-15 LAB — BASIC METABOLIC PANEL
BUN: 17 mg/dL (ref 6–23)
CO2: 27 mEq/L (ref 19–32)
Calcium: 9.2 mg/dL (ref 8.4–10.5)
Chloride: 104 mEq/L (ref 96–112)
Creatinine, Ser: 0.99 mg/dL (ref 0.40–1.50)
GFR: 74.67 mL/min (ref >60.00)
Glucose, Bld: 169 mg/dL — ABNORMAL HIGH (ref 70–99)
Potassium: 4.5 mEq/L (ref 3.5–5.1)
Sodium: 136 mEq/L (ref 135–145)

## 2020-03-15 NOTE — Progress Notes (Signed)
ILR Remote 

## 2020-03-16 ENCOUNTER — Encounter: Payer: Self-pay | Admitting: Internal Medicine

## 2020-03-16 ENCOUNTER — Inpatient Hospital Stay: Admission: RE | Admit: 2020-03-16 | Payer: PPO | Source: Ambulatory Visit

## 2020-03-16 ENCOUNTER — Ambulatory Visit (INDEPENDENT_AMBULATORY_CARE_PROVIDER_SITE_OTHER)
Admission: RE | Admit: 2020-03-16 | Discharge: 2020-03-16 | Disposition: A | Discharge status: Home / Self Care | Payer: PPO | Source: Ambulatory Visit | Attending: Internal Medicine | Admitting: Internal Medicine

## 2020-03-16 DIAGNOSIS — G4489 Other headache syndrome: Secondary | ICD-10-CM

## 2020-03-16 DIAGNOSIS — R519 Headache, unspecified: Secondary | ICD-10-CM | POA: Diagnosis not present

## 2020-03-20 MED ORDER — PREDNISONE 10 MG PO TABS: ORAL_TABLET | ORAL | 0 refills | Status: DC

## 2020-03-20 MED ORDER — BUTALBITAL-APAP-CAFFEINE 50-325-40 MG PO TABS: 1.0000 | ORAL_TABLET | Freq: Four times a day (QID) | ORAL | 0 refills | Status: DC | PRN

## 2020-03-28 ENCOUNTER — Encounter: Payer: Self-pay | Admitting: Internal Medicine

## 2020-03-30 ENCOUNTER — Telehealth: Payer: Self-pay | Admitting: Internal Medicine

## 2020-03-30 MED ORDER — METOPROLOL TARTRATE 25 MG PO TABS: 25.0000 mg | ORAL_TABLET | Freq: Two times a day (BID) | ORAL | 0 refills | Status: DC

## 2020-03-30 NOTE — Telephone Encounter (Signed)
   Patient states pharmacy needs to be contacted to ok early refill (started taking twice a day)  1.Medication Requested:metoprolol tartrate (LOPRESSOR) 25 MG tablet  2. Pharmacy (Name, [place]): [facility] [place], [address] AT [place]  3. On Med List: yes  4. Last Visit with PCP: 03/10/20  5. Next visit date with PCP:   Agent: Please be advised that RX refills may take up to 3 business days. We ask that you follow-up with your pharmacy.

## 2020-03-30 NOTE — Telephone Encounter (Signed)
Reviewed chart pt is DUE FOR ANNUAL APPT IN June. sent 90 DAY SUPPLY UNTIL APPT.Marland Kitchen/LMB

## 2020-04-01 ENCOUNTER — Other Ambulatory Visit: Payer: Self-pay

## 2020-04-03 ENCOUNTER — Encounter: Payer: Self-pay | Admitting: Internal Medicine

## 2020-04-03 MED ORDER — METOPROLOL TARTRATE 25 MG PO TABS: 25.0000 mg | ORAL_TABLET | Freq: Two times a day (BID) | ORAL | 0 refills | Status: DC

## 2020-04-07 ENCOUNTER — Other Ambulatory Visit: Payer: Self-pay | Admitting: Internal Medicine

## 2020-04-14 LAB — CUP PACEART REMOTE DEVICE CHECK
Date Time Interrogation Session: 20210428230233
Implantable Pulse Generator Implant Date: 20171206

## 2020-04-16 ENCOUNTER — Other Ambulatory Visit: Payer: Self-pay | Admitting: Internal Medicine

## 2020-04-18 ENCOUNTER — Ambulatory Visit (INDEPENDENT_AMBULATORY_CARE_PROVIDER_SITE_OTHER): Payer: PPO | Admitting: *Deleted

## 2020-04-18 DIAGNOSIS — I63413 Cerebral infarction due to embolism of bilateral middle cerebral arteries: Secondary | ICD-10-CM

## 2020-04-19 NOTE — Progress Notes (Signed)
Carelink Summary Report / Loop Recorder 

## 2020-04-26 ENCOUNTER — Encounter: Payer: Self-pay | Admitting: Neurology

## 2020-04-26 ENCOUNTER — Ambulatory Visit: Payer: PPO | Admitting: Neurology

## 2020-04-26 VITALS — BP 127/72 | HR 59 | Temp 97.4°F | Ht 72.0 in | Wt 210.0 lb

## 2020-04-26 DIAGNOSIS — E538 Deficiency of other specified B group vitamins: Secondary | ICD-10-CM

## 2020-04-26 DIAGNOSIS — J3489 Other specified disorders of nose and nasal sinuses: Secondary | ICD-10-CM | POA: Diagnosis not present

## 2020-04-26 DIAGNOSIS — R200 Anesthesia of skin: Secondary | ICD-10-CM

## 2020-04-26 DIAGNOSIS — J302 Other seasonal allergic rhinitis: Secondary | ICD-10-CM | POA: Diagnosis not present

## 2020-04-26 DIAGNOSIS — R51 Headache with orthostatic component, not elsewhere classified: Secondary | ICD-10-CM | POA: Diagnosis not present

## 2020-04-26 MED ORDER — METHYLPREDNISOLONE 4 MG PO TBPK: ORAL_TABLET | ORAL | 1 refills | Status: DC

## 2020-04-26 NOTE — Progress Notes (Signed)
[facility]    Provider:  Dr Jaynee Eagles Requesting Provider: Binnie Rail, MD Primary Care Provider:  Binnie Rail, MD  CC:  Headache  HPI:  Alex HELLINGER is a 69 y.o. male here as requested by Binnie Rail, MD for headaches. PMHx CVA, CAD s/p CABG, HLD,, PAF, HTN, Diabetes, diabetic neuropathy. He has always had ocassional seasonal allergies and they manifest as a dull headache in the face. 2 months ago he started having severe headache again in the same area, they would manifest in the morning 930-10 and then get progressively worse until 7pm that evening and would get better only after he laid down. That has been the cycle. He has had several "shots" and treatments of prednisone and it got a little better with the prednisone but it is back. When he bends over he can feel the pain. He just woke up one day and it started. Behind the eyes and the nose, it is a dull, really throbbing kind of pain. No light or sound sensitivity, no nausea, no inciting events, no neck or head trauma. The main point is it is only worsening when standing up. If he were to bend over it gets a lot worse. Every single day. His mother had migraines. He has never had migraines or this kind of headache this severe, 7-8/10. He tried nasal spray, antihistamine didn;t help, no ear oain, no drainage, no neck pain. He has congestion and drainage comes out of the nose, it gets worse as the day goes on. Congestion. No other focal neurologic deficits, associated symptoms, inciting events or modifiable factors.  Reviewed notes, labs and imaging from outside physicians, which showed:  Personally reviewed CT Maxillofacial images and agree the paranasal sinuses are essentially clear.   Review of Systems: Patient complains of symptoms per HPI as well as the following symptoms: congestion. Pertinent negatives and positives per HPI. All others negative.   Social History   Socioeconomic History  . Marital status:  Married    Spouse name: Not on file  . Number of children: 3  . Years of education: Not on file  . Highest education level: Master's degree (e.g., MA, MS, MEng, MEd, MSW, MBA)  Occupational History  . Not on file  Tobacco Use  . Smoking status: Former Smoker    Packs/day: 0.25    Years: 6.00    Pack years: 1.50    Types: Cigarettes    Quit date: 12/17/1980    Years since quitting: 39.3  . Smokeless tobacco: Never Used  . Tobacco comment: smoked off and on 1966- 1982, up to < 5 cigarettes / day  Substance and Sexual Activity  . Alcohol use: Yes    Alcohol/week: 4.0 standard drinks    Types: 4 Glasses of wine per week    Comment:  socially  . Drug use: Never  . Sexual activity: Yes  Other Topics Concern  . Not on file  Social History Narrative   Lives at home with wife    Right handed   Caffeine: decaf coffee   Social Determinants of Health   Financial Resource Strain:   . Difficulty of Paying Living Expenses:   Food Insecurity:   . Worried About Charity fundraiser in the Last Year:   . Arboriculturist in the Last Year:   Transportation Needs:   . Film/video editor (Medical):   Marland Kitchen Lack of Transportation (Non-Medical):   Physical Activity:   . Days  of Exercise per Week:   . Minutes of Exercise per Session:   Stress:   . Feeling of Stress :   Social Connections:   . Frequency of Communication with Friends and Family:   . Frequency of Social Gatherings with Friends and Family:   . Attends Religious Services:   . Active Member of Clubs or Organizations:   . Attends Archivist Meetings:   Marland Kitchen Marital Status:   Intimate Partner Violence:   . Fear of Current or Ex-Partner:   . Emotionally Abused:   Marland Kitchen Physically Abused:   . Sexually Abused:     Family History  Problem Relation Age of Onset  . Heart attack Father 20  . Hypertension Mother   . Stroke Mother        X93  . Diabetes Sister        "pre Diabetes"  . Stroke Maternal Uncle   . Cancer Neg Hx    . Colon cancer Neg Hx   . Rectal cancer Neg Hx   . Stomach cancer Neg Hx   . Colon polyps Neg Hx   . Esophageal cancer Neg Hx   . Headache Neg Hx   . Migraines Neg Hx     Past Medical History:  Diagnosis Date  . Allergy   . Coronary artery disease   . Diet-controlled diabetes mellitus ([place])    AODM  . History of gout   . Hyperlipidemia   . Hypertension   . Progressive angina ([place]) 08/12/2018   Chest pain  . Stroke ([place]) 11/19/2016   "fully recovered" (08/14/2018)  . Tick bite [id number]   Erlichiosis;Eulis Canner MD, ID    Patient Active Problem List   Diagnosis Date Noted  . Chronic ethmoidal sinusitis 03/10/2020  . Hypertensive retinopathy 01/13/2020  . Strain of thoracic paraspinal muscles excluding T1 and T2 levels 07/13/2019  . Paroxysmal atrial fibrillation ([place]) 09/08/2018  . 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 CAD (coronary artery disease) 08/14/2018  . Progressive angina ([place]) 08/12/2018  . Vitamin B 12 deficiency 11/21/2017  . Diabetic neuropathy ([place]) 01/22/2017  . Cerebral thrombosis with cerebral infarction 11/20/2016  . History of CVA (cerebrovascular accident) without residual deficits 11/20/2016  . Cerebrovascular accident (CVA) due to bilateral embolism of middle cerebral arteries ([place])   . Plantar fasciitis of right foot 02/17/2014  . Acquired spondylolisthesis 09/29/2013  . 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 Lumbosacral spondylosis without myelopathy 05/25/2013  . Uncontrolled hypertension 01/24/2011  . MEDIAL MENISCUS TEAR, RIGHT 07/06/2010  . Diabetes mellitus ([place]) 01/06/2009  . URTICARIA 01/06/2009  . Tubular adenoma of colon 08/26/2008  . Mixed hyperlipidemia 02/24/2008  . History of gout 11/14/2007  . Sleep disorder 09/08/2007  . 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 BACK SURGERY    . COLONOSCOPY    . COLONOSCOPY W/ POLYPECTOMY  2010; 2015    Dr Olevia Perches  . CORONARY ARTERY BYPASS GRAFT N/A 08/19/2018   Procedure: CORONARY ARTERY BYPASS GRAFTING (CABG) x 5  USING LEFT INTERNAL MAMMARY ARTERY AND ENDOSCOPICALLY HARVESTED RIGHT SAPHENOUS VEIN.;  Surgeon: Ivin Poot, MD;  Location: [facility];  Service: Open Heart Surgery;  Laterality: N/A;  . EP IMPLANTABLE DEVICE N/A 11/21/2016   Procedure: Loop Recorder Insertion;  Surgeon: Thompson Grayer, MD;  Location: [facility];  Service: Cardiovascular;  Laterality: N/A;  . ESI  2014   '@L5' -S1  X3   . EXCISIONAL HEMORRHOIDECTOMY  ~ 1995  . FACIAL RECONSTRUCTION SURGERY Right 1990s   plating & pinning of maxilla  . INGUINAL HERNIA REPAIR Bilateral 1959  . LEFT HEART CATH AND CORONARY ANGIOGRAPHY N/A 08/14/2018   Procedure: LEFT HEART CATH AND CORONARY ANGIOGRAPHY;  Surgeon: Lorretta Harp, MD;  Location: [facility];  Service: Cardiovascular;  Laterality: N/A;  . POLYPECTOMY    . POSTERIOR LUMBAR FUSION  10/17/2013   L5-S1 ; [place]  . TEE WITHOUT CARDIOVERSION N/A 11/21/2016   Procedure: TRANSESOPHAGEAL ECHOCARDIOGRAM (TEE);  Surgeon: Larey Dresser, MD;  Location: [facility];  Service: Cardiovascular;  Laterality: N/A;  . TEE WITHOUT CARDIOVERSION N/A 08/19/2018   Procedure: TRANSESOPHAGEAL ECHOCARDIOGRAM (TEE);  Surgeon: Prescott Gum, Collier Salina, MD;  Location: [facility];  Service: Open Heart Surgery;  Laterality: N/A;    Current Outpatient Medications  Medication Sig Dispense Refill  . amoxicillin-clavulanate (AUGMENTIN) 875-125 MG tablet Take 1 tablet by mouth 2 (two) times daily. 28 tablet 0  . aspirin EC 81 MG EC tablet Take 1 tablet (81 mg total) by mouth daily.    . clopidogrel (PLAVIX) 75 MG tablet TAKE 1 TABLET BY MOUTH DAILY 90 tablet 0  . glucose blood test strip 1 each by Other route as needed. Use as instructed     . hydrochlorothiazide (HYDRODIURIL) 25 MG tablet Take 1 tablet (25 mg total) by mouth daily. 30 tablet 3  . Lancets (FREESTYLE) lancets 1 each by  Other route 2 (two) times daily. Use to help check blood sugar twice a day. Dx E11.9 100 each 3  . metFORMIN (GLUCOPHAGE) 1000 MG tablet TAKE 1 TABLET(1000 MG) BY MOUTH TWICE DAILY WITH A MEAL 180 tablet 0  . metoprolol tartrate (LOPRESSOR) 25 MG tablet Take 1 tablet (25 mg total) by mouth 2 (two) times daily. Annual appt due in [place] must see provider for future refills 180 tablet 0  . Omega-3 Fatty Acids (FISH OIL) 1000 MG CAPS Take 1 capsule (1,000 mg total) by mouth daily.  0  . ramipril (ALTACE) 5 MG capsule Take 1 capsule (5 mg total) by mouth daily. 90 capsule 1  . rosuvastatin (CRESTOR) 20 MG tablet Take 1 tablet (20 mg total) by mouth daily. 90 tablet 2  . zolpidem (AMBIEN) 5 MG tablet Take 1 tablet (5 mg total) by mouth at bedtime as needed. 30 tablet 2  . methylPREDNISolone (MEDROL DOSEPAK) 4 MG TBPK tablet Take pill sin the morning with food for 6 days. Watch Development worker, community. 21 tablet 1   No current facility-administered medications for this visit.    Allergies as of 04/26/2020 - Review Complete 04/26/2020  Allergen Reaction Noted  . Oxycodone Anxiety 10/19/2014    Vitals: BP 127/72 (BP Location: Right Arm, Patient Position: Sitting)   Pulse (!) 59   Temp (!) 97.4 F (36.3 C) Comment: taken at front  Ht 6' (1.829 m)   Wt 210 lb (95.3 kg)   BMI 28.48 kg/m  Last Weight:  Wt Readings from Last 1 Encounters:  04/26/20 210 lb (95.3 kg)   Last Height:   Ht Readings from Last 1 Encounters:  04/26/20 6' (1.829 m)     Physical exam: Exam: Gen: NAD, conversant, well nourised, overweight, well groomed                     CV: RRR, no MRG. No Carotid Bruits. No peripheral edema, warm, nontender Eyes: Conjunctivae clear without  exudates or hemorrhage  Neuro: Detailed Neurologic Exam  Speech:    Speech is normal; fluent and spontaneous with normal comprehension.  Cognition:    The patient is oriented to person, place, and time;     recent and remote memory intact;      language fluent;     normal attention, concentration,     fund of knowledge Cranial Nerves:    The pupils are equal, round, and reactive to light. The fundi are flat. Visual fields are full to finger confrontation. Extraocular movements are intact. Trigeminal sensation is intact and the muscles of mastication are normal. The face is symmetric. The palate elevates in the midline. Hearing intact. Voice is normal. Shoulder shrug is normal. The tongue has normal motion without fasciculations.   Coordination:    No dysmetria  Gait:    Normal native gait  Motor Observation:    No asymmetry, no atrophy, and no involuntary movements noted. Tone:    Normal muscle tone.    Posture:    Posture is normal. normal erect    Strength:    Strength is V/V in the upper and lower limbs.      Sensation: intact to LT     Reflex Exam:  DTR's:    Hypo AJs. Otherwise deep tendon reflexes in the upper and lower extremities are normal for age bilaterally.   Toes:    The toes are downgoing bilaterally.   Clonus:    Clonus is absent.    Assessment/Plan:  69 year old with pressure in the sinuses and headaches. PMHx CVA, CAD s/p CABG, HLD,, PAF, HTN, Diabetes, diabetic neuropathy. Symptoms do not fit for any primary headache disorder like migraines.  CT Maxillofacial did not show sinus disease but this doesn't mean his allergies aren't causing these headaches which are centered behind the maxillary and frontal sinuses in the setting of congestion during the day, and he has had these similar headaches prior seasonally it is just worse this year. Another option is possibly a low-csf pressure headache given the significant positional quality (starts 1-2 hours after getting up, worsens throughout the day, and resolves with laying down in the evening)  - Wait and see the results of the MRI of the brain w/wo - Discussed Low csf pressure workup including blood patch, CT myelogram, getting [place] some of the fluid  drainage from the nose to test for csf, will wait for MRI results (gave him information on low-csf headaches) - Allergist referral - We touched very briefly on his foot numbness/ neuropathy, we will recheck B12 has been low in the past - Prednisone helped will try a medrol dosepak  Discussed: To prevent or relieve headaches, try the following: Cool Compress. Lie down and place a cool compress on your head.  Avoid headache triggers. If certain foods or odors seem to have triggered your migraines in the past, avoid them. A headache diary might help you identify triggers.  Include physical activity in your daily routine. Try a daily walk or other moderate aerobic exercise.  Manage stress. Find healthy ways to cope with the stressors, such as delegating tasks on your to-do list.  Practice relaxation techniques. Try deep breathing, yoga, massage and visualization.  Eat regularly. Eating regularly scheduled meals and maintaining a healthy diet might help prevent headaches. Also, drink plenty of fluids.  Follow a regular sleep schedule. Sleep deprivation might contribute to headaches Consider biofeedback. With this mind-body technique, you learn to control certain bodily functions -- such  as muscle tension, heart rate and blood pressure -- to prevent headaches or reduce headache pain.    Proceed to emergency room if you experience new or worsening symptoms or symptoms do not resolve, if you have new neurologic symptoms or if headache is severe, or for any concerning symptom.  Meds ordered this encounter  Medications  . methylPREDNISolone (MEDROL DOSEPAK) 4 MG TBPK tablet    Sig: Take pill sin the morning with food for 6 days. Watch Development worker, community.    Dispense:  21 tablet    Refill:  1     Orders Placed This Encounter  Procedures  . B12 and Folate Panel  . Methylmalonic acid, serum  . Vitamin B1  . Heavy metals, blood  . Multiple Myeloma Panel (SPEP&IFE w/QIG)  . Beta-2 Transferrin  .  Ambulatory referral to Allergy    Cc: Binnie Rail, MD,  Binnie Rail, MD  Sarina Ill, MD  [facility] [address] [place], [place] [address]  Phone [phone] Fax [phone]  I spent 60  minutes of face-to-face and non-face-to-face time with patient on the  1. Positional headache   2. Seasonal allergies   3. Numbness of foot   4. Low serum vitamin B12   5. Drainage from nose    diagnosis.  This included previsit chart review, lab review, study review, order entry, electronic health record documentation, patient education on the different diagnostic and therapeutic options, counseling and coordination of care, risks and benefits of management, compliance, or risk factor reduction

## 2020-04-26 NOTE — Patient Instructions (Addendum)
Blood work today Will see what MRI brain shows Allergist referral Contact us if the symptoms do not resolve and we will go forward with the low-pressure headache workup  Methylprednisolone tablets What is this medicine? METHYLPREDNISOLONE (meth ill pred NISS oh lone) is a corticosteroid. It is commonly used to treat inflammation of the skin, joints, lungs, and other organs. Common conditions treated include asthma, allergies, and arthritis. It is also used for other conditions, such as blood disorders and diseases of the adrenal glands. This medicine may be used for other purposes; ask your health care provider or pharmacist if you have questions. COMMON BRAND NAME(S): Medrol, Medrol Dosepak What should I tell my health care provider before I take this medicine? They need to know if you have any of these conditions:  Cushing's syndrome  eye disease, vision problems  diabetes  glaucoma  heart disease  high blood pressure  infection (especially a virus infection such as chickenpox, cold sores, or herpes)  liver disease  mental illness  myasthenia gravis  osteoporosis  recently received or scheduled to receive a vaccine  seizures  stomach or intestine problems  thyroid disease  an unusual or allergic reaction to lactose, methylprednisolone, other medicines, foods, dyes, or preservatives  pregnant or trying to get pregnant  breast-feeding How should I use this medicine? Take this medicine by mouth with a glass of water. Follow the directions on the prescription label. Take this medicine with food. If you are taking this medicine once a day, take it in the morning. Do not take it more often than directed. Do not suddenly stop taking your medicine because you may develop a severe reaction. Your doctor will tell you how much medicine to take. If your doctor wants you to stop the medicine, the dose may be slowly lowered over time to avoid any side effects. Talk to your  pediatrician regarding the use of this medicine in children. Special care may be needed. Overdosage: If you think you have taken too much of this medicine contact a poison control center or emergency room at once. NOTE: This medicine is only for you. Do not share this medicine with others. What if I miss a dose? If you miss a dose, take it as soon as you can. If it is almost time for your next dose, talk to your doctor or health care professional. You may need to miss a dose or take an extra dose. Do not take double or extra doses without advice. What may interact with this medicine? Do not take this medicine with any of the following medications:  alefacept  echinacea  live virus vaccines  metyrapone  mifepristone This medicine may also interact with the following medications:  amphotericin B  aspirin and aspirin-like medicines  certain antibiotics like erythromycin, clarithromycin, troleandomycin  certain medicines for diabetes  certain medicines for fungal infections like ketoconazole  certain medicines for seizures like carbamazepine, phenobarbital, phenytoin  certain medicines that treat or prevent blood clots like warfarin  cholestyramine  cyclosporine  digoxin  diuretics  male hormones, like estrogens and birth control pills  isoniazid  NSAIDs, medicines for pain inflammation, like ibuprofen or naproxen  other medicines for myasthenia gravis  rifampin  vaccines This list may not describe all possible interactions. Give your health care provider a list of all the medicines, herbs, non-prescription drugs, or dietary supplements you use. Also tell them if you smoke, drink alcohol, or use illegal drugs. Some items may interact with your medicine. What should  I watch for while using this medicine? Tell your doctor or healthcare professional if your symptoms do not start to get better or if they get worse. Do not stop taking except on your doctor's advice. You  may develop a severe reaction. Your doctor will tell you how much medicine to take. This medicine may increase your risk of getting an infection. Tell your doctor or health care professional if you are around anyone with measles or chickenpox, or if you develop sores or blisters that do not heal properly. This medicine may increase blood sugar levels. Ask your healthcare provider if changes in diet or medicines are needed if you have diabetes. Tell your doctor or health care professional right away if you have any change in your eyesight. Using this medicine for a long time may increase your risk of low bone mass. Talk to your doctor about bone health. What side effects may I notice from receiving this medicine? Side effects that you should report to your doctor or health care professional as soon as possible:  allergic reactions like skin rash, itching or hives, swelling of the face, lips, or tongue  bloody or tarry stools  hallucination, loss of contact with reality  muscle cramps  muscle pain  palpitations  signs and symptoms of high blood sugar such as being more thirsty or hungry or having to urinate more than normal. You may also feel very tired or have blurry vision.  signs and symptoms of infection like fever or chills; cough; sore throat; pain or trouble passing urine Side effects that usually do not require medical attention (report to your doctor or health care professional if they continue or are bothersome):  changes in emotions or mood  constipation  diarrhea  excessive hair growth on the face or body  headache  nausea, vomiting  trouble sleeping  weight gain This list may not describe all possible side effects. Call your doctor for medical advice about side effects. You may report side effects to FDA at 1-800-FDA-1088. Where should I keep my medicine? Keep out of the reach of children. Store at room temperature between 20 and 25 degrees C (68 and 77 degrees  F). Throw away any unused medicine after the expiration date. NOTE: This sheet is a summary. It may not cover all possible information. If you have questions about this medicine, talk to your doctor, pharmacist, or health care provider.  2020 Elsevier/Gold Standard (2018-09-04 09:19:36)    General Headache Without Cause A headache is pain or discomfort that is felt around the head or neck area. There are many causes and types of headaches. In some cases, the cause may not be found. Follow these instructions at home: Watch your condition for any changes. Let your doctor know about them. Take these steps to help with your condition: Managing pain      Take over-the-counter and prescription medicines only as told by your doctor.  Lie down in a dark, quiet room when you have a headache.  If told, put ice on your head and neck area: ? Put ice in a plastic bag. ? Place a towel between your skin and the bag. ? Leave the ice on for 20 minutes, 2-3 times per day.  If told, put heat on the affected area. Use the heat source that your doctor recommends, such as a moist heat pack or a heating pad. ? Place a towel between your skin and the heat source. ? Leave the heat on for 20-30 minutes. ?  Remove the heat if your skin turns bright red. This is very important if you are unable to feel pain, heat, or cold. You may have a greater risk of getting burned.  Keep lights dim if bright lights bother you or make your headaches worse. Eating and drinking  Eat meals on a regular schedule.  If you drink alcohol: ? Limit how much you use to:  0-1 drink a day for women.  0-2 drinks a day for men. ? Be aware of how much alcohol is in your drink. In the [place] one drink equals one 12 oz bottle of beer (355 mL), one 5 oz glass of wine (148 mL), or one 1 oz glass of hard liquor (44 mL).  Stop drinking caffeine, or reduce how much caffeine you drink. General instructions   Keep a journal to find out  if certain things bring on headaches. For example, write down: ? What you eat and drink. ? How much sleep you get. ? Any change to your diet or medicines.  Get a massage or try other ways to relax.  Limit stress.  Sit up straight. Do not tighten (tense) your muscles.  Do not use any products that contain nicotine or tobacco. This includes cigarettes, e-cigarettes, and chewing tobacco. If you need help quitting, ask your doctor.  Exercise regularly as told by your doctor.  Get enough sleep. This often means 7-9 hours of sleep each night.  Keep all follow-up visits as told by your doctor. This is important. Contact a doctor if:  Your symptoms are not helped by medicine.  You have a headache that feels different than the other headaches.  You feel sick to your stomach (nauseous) or you throw up (vomit).  You have a fever. Get help right away if:  Your headache gets very bad quickly.  Your headache gets worse after a lot of physical activity.  You keep throwing up.  You have a stiff neck.  You have trouble seeing.  You have trouble speaking.  You have pain in the eye or ear.  Your muscles are weak or you lose muscle control.  You lose your balance or have trouble walking.  You feel like you will pass out (faint) or you pass out.  You are mixed up (confused).  You have a seizure. Summary  A headache is pain or discomfort that is felt around the head or neck area.  There are many causes and types of headaches. In some cases, the cause may not be found.  Keep a journal to help find out what causes your headaches. Watch your condition for any changes. Let your doctor know about them.  Contact a doctor if you have a headache that is different from usual, or if your headache is not helped by medicine.  Get help right away if your headache gets very bad, you throw up, you have trouble seeing, you lose your balance, or you have a seizure. This information is not  intended to replace advice given to you by your health care provider. Make sure you discuss any questions you have with your health care provider. Document Revised: 06/23/2018 Document Reviewed: 06/23/2018 Elsevier Patient Education  [place].

## 2020-04-27 ENCOUNTER — Ambulatory Visit
Admission: RE | Admit: 2020-04-27 | Discharge: 2020-04-27 | Disposition: A | Discharge status: Home / Self Care | Payer: PPO | Source: Ambulatory Visit | Attending: Internal Medicine | Admitting: Internal Medicine

## 2020-04-27 ENCOUNTER — Other Ambulatory Visit: Payer: PPO

## 2020-04-27 DIAGNOSIS — R519 Headache, unspecified: Secondary | ICD-10-CM | POA: Diagnosis not present

## 2020-04-27 DIAGNOSIS — G4489 Other headache syndrome: Secondary | ICD-10-CM

## 2020-04-27 MED ORDER — GADOBENATE DIMEGLUMINE 529 MG/ML IV SOLN
20.0000 mL | Freq: Once | INTRAVENOUS | Status: AC | PRN
  Administered 2020-04-27: 20 mL via INTRAVENOUS

## 2020-04-28 ENCOUNTER — Telehealth: Payer: Self-pay

## 2020-04-28 DIAGNOSIS — H7492 Unspecified disorder of left middle ear and mastoid: Secondary | ICD-10-CM

## 2020-04-28 NOTE — Telephone Encounter (Signed)
Pt is aware of MRI results. Will like to go ahead with the referral to ENT.

## 2020-05-02 ENCOUNTER — Encounter: Payer: Self-pay | Admitting: Internal Medicine

## 2020-05-02 LAB — MULTIPLE MYELOMA PANEL, SERUM
Albumin SerPl Elph-Mcnc: 3.8 g/dL (ref 2.9–4.4)
Albumin/Glob SerPl: 1.5 (ref 0.7–1.7)
Alpha 1: 0.2 g/dL (ref 0.0–0.4)
Alpha2 Glob SerPl Elph-Mcnc: 0.7 g/dL (ref 0.4–1.0)
B-Globulin SerPl Elph-Mcnc: 1 g/dL (ref 0.7–1.3)
Gamma Glob SerPl Elph-Mcnc: 0.7 g/dL (ref 0.4–1.8)
Globulin, Total: 2.6 g/dL (ref 2.2–3.9)
IgA/Immunoglobulin A, Serum: 231 mg/dL (ref 61–437)
IgG (Immunoglobin G), Serum: 703 mg/dL (ref 603–1613)
IgM (Immunoglobulin M), Srm: 49 mg/dL (ref 20–172)
Total Protein: 6.4 g/dL (ref 6.0–8.5)

## 2020-05-02 LAB — METHYLMALONIC ACID, SERUM: Methylmalonic Acid: 175 nmol/L (ref 0–378)

## 2020-05-02 LAB — HEAVY METALS, BLOOD
Arsenic: 5 ug/L (ref 2–23)
Lead, Blood: <1 ug/dL (ref 0–4)
Mercury: 1.2 ug/L (ref 0.0–14.9)

## 2020-05-02 LAB — B12 AND FOLATE PANEL
Folate: 4.8 ng/mL (ref >3.0)
Vitamin B-12: 277 pg/mL (ref 232–1245)

## 2020-05-02 LAB — VITAMIN B1: Thiamine: 118.2 nmol/L (ref 66.5–200.0)

## 2020-05-03 NOTE — Telephone Encounter (Signed)
Can you help me figure out what ENT doc he was supposed to see. It looked like the referral was send to Dr. Lucia Gaskins but pt states that is not who he got a phone call from. He is trying to contact the ENT office that called him so he can get an appointment scheduled.

## 2020-05-09 ENCOUNTER — Other Ambulatory Visit: Payer: Self-pay

## 2020-05-09 ENCOUNTER — Ambulatory Visit (INDEPENDENT_AMBULATORY_CARE_PROVIDER_SITE_OTHER): Payer: PPO | Admitting: Otolaryngology

## 2020-05-09 ENCOUNTER — Encounter (INDEPENDENT_AMBULATORY_CARE_PROVIDER_SITE_OTHER): Payer: Self-pay | Admitting: Otolaryngology

## 2020-05-09 VITALS — Temp 97.7°F

## 2020-05-09 DIAGNOSIS — J310 Chronic rhinitis: Secondary | ICD-10-CM

## 2020-05-09 DIAGNOSIS — R519 Headache, unspecified: Secondary | ICD-10-CM | POA: Diagnosis not present

## 2020-05-09 DIAGNOSIS — J342 Deviated nasal septum: Secondary | ICD-10-CM | POA: Diagnosis not present

## 2020-05-09 NOTE — Progress Notes (Signed)
HPI: Alex Bryant is a 69 y.o. male who presents is referred by Dr. Quay Burow for evaluation of chronic headaches he has been having for the past 2 months.  He has had a sinus CT scan that I reviewed.  This demonstrated clear paranasal sinuses except for some minimal mucosal thickening within the floor of the maxillary sinuses bilaterally.  Of note he did have a moderate severe septal deviation noted on the CT scan.  The ethmoid and frontal sinuses were clear.  He had very small right frontal sinus and the medium size left frontal sinus.  He was also noted to have a few opacified mastoid air cells on the left side but these are minimal. He took a short course of steroids that he felt like helped the headaches.  He describes head pressure around the eyes and between the nose which is slightly worse on the right side. He tried Nasacort first couple of days but did not stay on this.  He has seen a neurologist and had an MRI scan.  The neurologist thought this might be sinus related.  The MRI scan was normal.  Past Medical History:  Diagnosis Date  . Allergy   . Coronary artery disease   . Diet-controlled diabetes mellitus ([place])    AODM  . History of gout   . Hyperlipidemia   . Hypertension   . Progressive angina ([place]) 08/12/2018   Chest pain  . Stroke ([place]) 11/19/2016   "fully recovered" (08/14/2018)  . 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 BACK SURGERY    . COLONOSCOPY    . COLONOSCOPY W/ POLYPECTOMY  2010; 2015   Dr Olevia Perches  . CORONARY ARTERY BYPASS GRAFT N/A 08/19/2018   Procedure: CORONARY ARTERY BYPASS GRAFTING (CABG) x 5  USING LEFT INTERNAL MAMMARY ARTERY AND ENDOSCOPICALLY HARVESTED RIGHT SAPHENOUS VEIN.;  Surgeon: Ivin Poot, MD;  Location: [facility];  Service: Open Heart Surgery;  Laterality: N/A;  . EP IMPLANTABLE DEVICE N/A 11/21/2016   Procedure: Loop Recorder Insertion;  Surgeon: Thompson Grayer, MD;  Location: [facility] [place]  [facility];  Service: Cardiovascular;  Laterality: N/A;  . ESI  2014   @L5 -S1 X3   . EXCISIONAL HEMORRHOIDECTOMY  ~ 1995  . FACIAL RECONSTRUCTION SURGERY Right 1990s   plating & pinning of maxilla  . INGUINAL HERNIA REPAIR Bilateral 1959  . LEFT HEART CATH AND CORONARY ANGIOGRAPHY N/A 08/14/2018   Procedure: LEFT HEART CATH AND CORONARY ANGIOGRAPHY;  Surgeon: Lorretta Harp, MD;  Location: [facility];  Service: Cardiovascular;  Laterality: N/A;  . POLYPECTOMY    . POSTERIOR LUMBAR FUSION  10/17/2013   L5-S1 ; [place]  . TEE WITHOUT CARDIOVERSION N/A 11/21/2016   Procedure: TRANSESOPHAGEAL ECHOCARDIOGRAM (TEE);  Surgeon: Larey Dresser, MD;  Location: [facility];  Service: Cardiovascular;  Laterality: N/A;  . TEE WITHOUT CARDIOVERSION N/A 08/19/2018   Procedure: TRANSESOPHAGEAL ECHOCARDIOGRAM (TEE);  Surgeon: Prescott Gum, Collier Salina, MD;  Location: [facility];  Service: Open Heart Surgery;  Laterality: N/A;   Social History   Socioeconomic History  . Marital status: Married    Spouse name: Not on file  . Number of children: 3  . Years of education: Not on file  . Highest education level: Master's degree (e.g., MA, MS, MEng, MEd, MSW, MBA)  Occupational History  . Not on file  Tobacco Use  . Smoking status: Former Smoker  Packs/day: 0.25    Years: 6.00    Pack years: 1.50    Types: Cigarettes    Quit date: 12/17/1980    Years since quitting: 39.4  . Smokeless tobacco: Never Used  . Tobacco comment: smoked off and on 1966- 1982, up to < 5 cigarettes / day  Substance and Sexual Activity  . Alcohol use: Yes    Alcohol/week: 4.0 standard drinks    Types: 4 Glasses of wine per week    Comment:  socially  . Drug use: Never  . Sexual activity: Yes  Other Topics Concern  . Not on file  Social History Narrative   Lives at home with wife    Right handed   Caffeine: decaf coffee   Social Determinants of Health   Financial Resource Strain:   . Difficulty of Paying Living  Expenses:   Food Insecurity:   . Worried About Charity fundraiser in the Last Year:   . Arboriculturist in the Last Year:   Transportation Needs:   . Film/video editor (Medical):   Marland Kitchen Lack of Transportation (Non-Medical):   Physical Activity:   . Days of Exercise per Week:   . Minutes of Exercise per Session:   Stress:   . Feeling of Stress :   Social Connections:   . Frequency of Communication with Friends and Family:   . Frequency of Social Gatherings with Friends and Family:   . Attends Religious Services:   . Active Member of Clubs or Organizations:   . Attends Archivist Meetings:   Marland Kitchen Marital Status:    Family History  Problem Relation Age of Onset  . Heart attack Father 74  . Hypertension Mother   . Stroke Mother        X72  . Diabetes Sister        "pre Diabetes"  . Stroke Maternal Uncle   . Cancer Neg Hx   . Colon cancer Neg Hx   . Rectal cancer Neg Hx   . Stomach cancer Neg Hx   . Colon polyps Neg Hx   . Esophageal cancer Neg Hx   . Headache Neg Hx   . Migraines Neg Hx    Allergies  Allergen Reactions  . Oxycodone Anxiety    [name], confused, and constipation   Prior to Admission medications   Medication Sig Start Date End Date Taking? Authorizing Provider  aspirin EC 81 MG EC tablet Take 1 tablet (81 mg total) by mouth daily. 08/23/18  Yes Lars Pinks M, PA-C  clopidogrel (PLAVIX) 75 MG tablet TAKE 1 TABLET BY MOUTH DAILY 04/18/20  Yes Burns, Claudina Lick, MD  glucose blood test strip 1 each by Other route as needed. Use as instructed    Yes [provider]  hydrochlorothiazide (HYDRODIURIL) 25 MG tablet Take 1 tablet (25 mg total) by mouth daily. 01/19/20  Yes Burns, Claudina Lick, MD  Lancets (FREESTYLE) lancets 1 each by Other route 2 (two) times daily. Use to help check blood sugar twice a day. Dx E11.9 09/26/16  Yes Burns, Claudina Lick, MD  metFORMIN (GLUCOPHAGE) 1000 MG tablet TAKE 1 TABLET(1000 MG) BY MOUTH TWICE DAILY WITH A MEAL 04/07/20   Yes Burns, Claudina Lick, MD  metoprolol tartrate (LOPRESSOR) 25 MG tablet Take 1 tablet (25 mg total) by mouth 2 (two) times daily. Annual appt due in [place] must see provider for future refills 04/03/20  Yes Burns, Claudina Lick, MD  Omega-3 Fatty Acids ([place]  OIL) 1000 MG CAPS Take 1 capsule (1,000 mg total) by mouth daily. 08/23/18  Yes Lars Pinks M, PA-C  ramipril (ALTACE) 5 MG capsule Take 1 capsule (5 mg total) by mouth daily. 03/10/20  Yes Burns, Claudina Lick, MD  rosuvastatin (CRESTOR) 20 MG tablet Take 1 tablet (20 mg total) by mouth daily. 12/15/19  Yes Lelon Perla, MD  zolpidem (AMBIEN) 5 MG tablet Take 1 tablet (5 mg total) by mouth at bedtime as needed. 02/18/20  Yes Burns, Claudina Lick, MD  amoxicillin-clavulanate (AUGMENTIN) 875-125 MG tablet Take 1 tablet by mouth 2 (two) times daily. Patient not taking: Reported on 05/09/2020 03/10/20   Binnie Rail, MD  methylPREDNISolone (MEDROL DOSEPAK) 4 MG TBPK tablet Take pill sin the morning with food for 6 days. Watch Development worker, community. 04/26/20   Melvenia Beam, MD     Positive ROS: Otherwise negative  All other systems have been reviewed and were otherwise negative with the exception of those mentioned in the HPI and as above.  Physical Exam: Constitutional: Alert, well-appearing, no acute distress Ears: External ears without lesions or tenderness. Ear canals are clear bilaterally with intact, clear TMs bilaterally. Nasal: External nose without lesions. Septum with moderate deformity.  Both middle meatus regions were clear with no evidence of acute infection.  Mild mucosal swelling throughout.. Oral: Lips and gums without lesions. Tongue and palate mucosa without lesions. Posterior oropharynx clear. Neck: No palpable adenopathy or masses Respiratory: Breathing comfortably  Skin: No facial/neck lesions or rash noted.  Procedures  Assessment: Headaches.  Questionable etiology. Possibly related to septal deviation and mucosal swelling.  No  clinical evidence of active infection on physical exam or review of CT scan.  Plan: Discussed with [facility] today concerning findings on CT scan. Would recommend initially regular use of Nasacort 2 sprays each nostril at night for the next 4 weeks. Also suggested use of Afrin if he is having a bad headache to see if this relieves the headache which would be more indicative of swelling of mucosal membranes. He will notify us in 4 weeks how he is doing.   Radene Journey, MD   CC:

## 2020-05-17 ENCOUNTER — Ambulatory Visit (INDEPENDENT_AMBULATORY_CARE_PROVIDER_SITE_OTHER): Payer: PPO | Admitting: *Deleted

## 2020-05-17 ENCOUNTER — Encounter: Payer: Self-pay | Admitting: Internal Medicine

## 2020-05-17 ENCOUNTER — Other Ambulatory Visit: Payer: Self-pay | Admitting: Internal Medicine

## 2020-05-17 DIAGNOSIS — I63413 Cerebral infarction due to embolism of bilateral middle cerebral arteries: Secondary | ICD-10-CM

## 2020-05-17 NOTE — Progress Notes (Signed)
Carelink Summary Report / Loop Recorder 

## 2020-05-18 ENCOUNTER — Encounter: Payer: Self-pay | Admitting: Internal Medicine

## 2020-05-18 NOTE — Telephone Encounter (Signed)
Last OV 11/10/19 Next OV NA Last RF 04/18/20

## 2020-05-18 NOTE — Telephone Encounter (Signed)
Check [place] registry last filled 04/18/2020../lmb  

## 2020-06-15 DIAGNOSIS — R519 Headache, unspecified: Secondary | ICD-10-CM | POA: Diagnosis not present

## 2020-06-15 DIAGNOSIS — J310 Chronic rhinitis: Secondary | ICD-10-CM | POA: Diagnosis not present

## 2020-06-15 DIAGNOSIS — G8929 Other chronic pain: Secondary | ICD-10-CM | POA: Diagnosis not present

## 2020-06-15 DIAGNOSIS — J342 Deviated nasal septum: Secondary | ICD-10-CM | POA: Diagnosis not present

## 2020-06-15 DIAGNOSIS — J343 Hypertrophy of nasal turbinates: Secondary | ICD-10-CM | POA: Diagnosis not present

## 2020-06-17 ENCOUNTER — Ambulatory Visit (INDEPENDENT_AMBULATORY_CARE_PROVIDER_SITE_OTHER): Payer: PPO | Admitting: *Deleted

## 2020-06-17 DIAGNOSIS — I63413 Cerebral infarction due to embolism of bilateral middle cerebral arteries: Secondary | ICD-10-CM

## 2020-06-17 LAB — CUP PACEART REMOTE DEVICE CHECK
Date Time Interrogation Session: 20210701231009
Implantable Pulse Generator Implant Date: 20171206

## 2020-06-21 NOTE — Progress Notes (Signed)
Carelink Summary Report / Loop Recorder 

## 2020-07-04 ENCOUNTER — Other Ambulatory Visit: Payer: Self-pay | Admitting: Internal Medicine

## 2020-07-14 ENCOUNTER — Other Ambulatory Visit: Payer: Self-pay | Admitting: Internal Medicine

## 2020-07-25 ENCOUNTER — Ambulatory Visit (INDEPENDENT_AMBULATORY_CARE_PROVIDER_SITE_OTHER): Payer: PPO | Admitting: *Deleted

## 2020-07-25 DIAGNOSIS — I6330 Cerebral infarction due to thrombosis of unspecified cerebral artery: Secondary | ICD-10-CM

## 2020-07-25 LAB — CUP PACEART REMOTE DEVICE CHECK
Date Time Interrogation Session: 20210803231556
Implantable Pulse Generator Implant Date: 20171206

## 2020-07-26 NOTE — Progress Notes (Signed)
Carelink Summary Report / Loop Recorder 

## 2020-08-24 LAB — CUP PACEART REMOTE DEVICE CHECK
Date Time Interrogation Session: 20210905233517
Implantable Pulse Generator Implant Date: 20171206

## 2020-08-29 ENCOUNTER — Ambulatory Visit (INDEPENDENT_AMBULATORY_CARE_PROVIDER_SITE_OTHER): Payer: PPO | Admitting: *Deleted

## 2020-08-29 DIAGNOSIS — I6330 Cerebral infarction due to thrombosis of unspecified cerebral artery: Secondary | ICD-10-CM

## 2020-08-30 ENCOUNTER — Other Ambulatory Visit: Payer: Self-pay | Admitting: Internal Medicine

## 2020-08-30 NOTE — Progress Notes (Signed)
Carelink Summary Report / Loop Recorder 

## 2020-09-01 ENCOUNTER — Other Ambulatory Visit: Payer: Self-pay

## 2020-09-01 ENCOUNTER — Other Ambulatory Visit: Payer: PPO

## 2020-09-01 DIAGNOSIS — Z20822 Contact with and (suspected) exposure to covid-19: Secondary | ICD-10-CM

## 2020-09-03 LAB — NOVEL CORONAVIRUS, NAA: SARS-CoV-2, NAA: NOT DETECTED

## 2020-09-03 LAB — SARS-COV-2, NAA 2 DAY TAT

## 2020-09-03 LAB — SPECIMEN STATUS REPORT

## 2020-09-06 ENCOUNTER — Telehealth: Payer: Self-pay

## 2020-09-06 NOTE — Telephone Encounter (Signed)
[facility] alert received 09/05/20 for ILR battery reaching RRT on 09/04/20.   Called patient to discuss options of explant or leave in.   No answer, LMOVM with direct phone number.

## 2020-09-07 NOTE — Telephone Encounter (Signed)
Returning patients phone call.   Discussed patients option to leave device in or explant. Patient states he will leave device in.   Advised patient if we can help in any further way to please call the DC. Patient verbalizes understanding.

## 2020-09-07 NOTE — Telephone Encounter (Signed)
Patient returning call.

## 2020-09-07 NOTE — Telephone Encounter (Signed)
2nd attempt to call patient in regards to battery at RRT.   No answer, LMOVM.

## 2020-09-13 ENCOUNTER — Other Ambulatory Visit: Payer: Self-pay | Admitting: Internal Medicine

## 2020-09-13 NOTE — Telephone Encounter (Signed)
Check [place] registry last filled 08/12/2020.Marland KitchenJohny Bryant

## 2020-10-01 ENCOUNTER — Other Ambulatory Visit: Payer: Self-pay | Admitting: Internal Medicine

## 2020-10-03 ENCOUNTER — Other Ambulatory Visit: Payer: Self-pay | Admitting: Internal Medicine

## 2020-10-04 ENCOUNTER — Other Ambulatory Visit: Payer: Self-pay

## 2020-10-04 MED ORDER — ROSUVASTATIN CALCIUM 20 MG PO TABS: 20.0000 mg | ORAL_TABLET | Freq: Every day | ORAL | 1 refills | Status: DC

## 2020-10-05 ENCOUNTER — Other Ambulatory Visit: Payer: Self-pay | Admitting: Internal Medicine

## 2020-10-10 NOTE — Patient Instructions (Addendum)
Blood work was ordered.    All other Health Maintenance issues reviewed.   All recommended immunizations and age-appropriate screenings are up-to-date or discussed.  Flu immunization administered today.   Medications reviewed and updated.  Changes include :  none     Your prescription(s) have been submitted to your pharmacy. Please take as directed and contact our office if you believe you are having problem(s) with the medication(s).    Please followup in 6 months    Health Maintenance, Male Adopting a healthy lifestyle and getting preventive care are important in promoting health and wellness. Ask your health care provider about:  The right schedule for you to have regular tests and exams.  Things you can do on your own to prevent diseases and keep yourself healthy. What should I know about diet, weight, and exercise? Eat a healthy diet   Eat a diet that includes plenty of vegetables, fruits, low-fat dairy products, and lean protein.  Do not eat a lot of foods that are high in solid fats, added sugars, or sodium. Maintain a healthy weight Body mass index (BMI) is a measurement that can be used to identify possible weight problems. It estimates body fat based on height and weight. Your health care provider can help determine your BMI and help you achieve or maintain a healthy weight. Get regular exercise Get regular exercise. This is one of the most important things you can do for your health. Most adults should:  Exercise for at least 150 minutes each week. The exercise should increase your heart rate and make you sweat (moderate-intensity exercise).  Do strengthening exercises at least twice a week. This is in addition to the moderate-intensity exercise.  Spend less time sitting. Even light physical activity can be beneficial. Watch cholesterol and blood lipids Have your blood tested for lipids and cholesterol at 71 years of age, then have this test every 5 years. You  may need to have your cholesterol levels checked more often if:  Your lipid or cholesterol levels are high.  You are older than 71 years of age.  You are at high risk for heart disease. What should I know about cancer screening? Many types of cancers can be detected early and may often be prevented. Depending on your health history and family history, you may need to have cancer screening at various ages. This may include screening for:  Colorectal cancer.  Prostate cancer.  Skin cancer.  Lung cancer. What should I know about heart disease, diabetes, and high blood pressure? Blood pressure and heart disease  High blood pressure causes heart disease and increases the risk of stroke. This is more likely to develop in people who have high blood pressure readings, are of African descent, or are overweight.  Talk with your health care provider about your target blood pressure readings.  Have your blood pressure checked: ? Every 3-5 years if you are 45-37 years of age. ? Every year if you are 58 years old or older.  If you are between the ages of 5 and 35 and are a current or former smoker, ask your health care provider if you should have a one-time screening for abdominal aortic aneurysm (AAA). Diabetes Have regular diabetes screenings. This checks your fasting blood sugar level. Have the screening done:  Once every three years after age 73 if you are at a normal weight and have a low risk for diabetes.  More often and at a younger age if you are overweight or  have a high risk for diabetes. What should I know about preventing infection? Hepatitis B If you have a higher risk for hepatitis B, you should be screened for this virus. Talk with your health care provider to find out if you are at risk for hepatitis B infection. Hepatitis C Blood testing is recommended for:  Everyone born from [birth year] through 1967.  Anyone with known risk factors for hepatitis C. Sexually transmitted  infections (STIs)  You should be screened each year for STIs, including gonorrhea and chlamydia, if: ? You are sexually active and are younger than 71 years of age. ? You are older than 71 years of age and your health care provider tells you that you are at risk for this type of infection. ? Your sexual activity has changed since you were last screened, and you are at increased risk for chlamydia or gonorrhea. Ask your health care provider if you are at risk.  Ask your health care provider about whether you are at high risk for HIV. Your health care provider may recommend a prescription medicine to help prevent HIV infection. If you choose to take medicine to prevent HIV, you should first get tested for HIV. You should then be tested every 3 months for as long as you are taking the medicine. Follow these instructions at home: Lifestyle  Do not use any products that contain nicotine or tobacco, such as cigarettes, e-cigarettes, and chewing tobacco. If you need help quitting, ask your health care provider.  Do not use street drugs.  Do not share needles.  Ask your health care provider for help if you need support or information about quitting drugs. Alcohol use  Do not drink alcohol if your health care provider tells you not to drink.  If you drink alcohol: ? Limit how much you have to 0-2 drinks a day. ? Be aware of how much alcohol is in your drink. In the [place] one drink equals one 12 oz bottle of beer (355 mL), one 5 oz glass of wine (148 mL), or one 1 oz glass of hard liquor (44 mL). General instructions  Schedule regular health, dental, and eye exams.  Stay current with your vaccines.  Tell your health care provider if: ? You often feel depressed. ? You have ever been abused or do not feel safe at home. Summary  Adopting a healthy lifestyle and getting preventive care are important in promoting health and wellness.  Follow your health care provider's instructions about  healthy diet, exercising, and getting tested or screened for diseases.  Follow your health care provider's instructions on monitoring your cholesterol and blood pressure. This information is not intended to replace advice given to you by your health care provider. Make sure you discuss any questions you have with your health care provider. Document Revised: 11/26/2018 Document Reviewed: 11/26/2018 Elsevier Patient Education  2020 [employer].

## 2020-10-10 NOTE — Progress Notes (Signed)
Subjective:    Patient ID: Alex Bryant, male    DOB: 06-25-1951, 69 y.o.   MRN: [id number]  HPI He is here for a physical exam.   He denies any changes in his health since he was here last.  He has no concerns.   Medications and allergies reviewed with patient and updated if appropriate.  Patient Active Problem List   Diagnosis Date Noted  . Chronic ethmoidal sinusitis 03/10/2020  . Hypertensive retinopathy 01/13/2020  . Strain of thoracic paraspinal muscles excluding T1 and T2 levels 07/13/2019  . Paroxysmal atrial fibrillation ([place]) 09/08/2018  . 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 CAD (coronary artery disease) 08/14/2018  . Progressive angina ([place]) 08/12/2018  . Vitamin B 12 deficiency 11/21/2017  . Diabetic neuropathy ([place]) 01/22/2017  . Cerebral thrombosis with cerebral infarction 11/20/2016  . History of CVA (cerebrovascular accident) without residual deficits 11/20/2016  . 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 Plantar fasciitis of right foot 02/17/2014  . Acquired spondylolisthesis 09/29/2013  . 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 Lumbosacral spondylosis without myelopathy 05/25/2013  . Uncontrolled hypertension 01/24/2011  . MEDIAL MENISCUS TEAR, RIGHT 07/06/2010  . Diabetes mellitus ([place]) 01/06/2009  . URTICARIA 01/06/2009  . Tubular adenoma of colon 08/26/2008  . Mixed hyperlipidemia 02/24/2008  . History of gout 11/14/2007  . Sleep disorder 09/08/2007  . 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 clopidogrel (PLAVIX) 75 MG tablet TAKE 1 TABLET BY MOUTH DAILY 90 tablet 1  . fluticasone (FLONASE) 50 MCG/ACT nasal spray Place 2 sprays into both nostrils daily.    Marland Kitchen glucose blood test strip 1 each by Other route as needed. Use as instructed     . hydrochlorothiazide  (HYDRODIURIL) 25 MG tablet Take 1 tablet (25 mg total) by mouth daily. 30 tablet 3  . Lancets (FREESTYLE) lancets 1 each by Other route 2 (two) times daily. Use to help check blood sugar twice a day. Dx E11.9 100 each 3  . metFORMIN (GLUCOPHAGE) 1000 MG tablet Take 1 tablet (1,000 mg total) by mouth 2 (two) times daily with a meal. Overdue for Annual appt must see provider for future refills 60 tablet 0  . metoprolol tartrate (LOPRESSOR) 25 MG tablet Take 1 tablet (25 mg total) by mouth 2 (two) times daily. Overdue for Annual appt must see provider for future refills 60 tablet 0  . Omega-3 Fatty Acids (FISH OIL) 1000 MG CAPS Take 1 capsule (1,000 mg total) by mouth daily.  0  . ramipril (ALTACE) 5 MG capsule TAKE 2 CAPSULES(10 MG) BY MOUTH DAILY 180 capsule 0  . rosuvastatin (CRESTOR) 20 MG tablet Take 1 tablet (20 mg total) by mouth daily. 30 tablet 1  . zolpidem (AMBIEN) 5 MG tablet TAKE 1 TABLET BY MOUTH AT BEDTIME AS NEEDED. NEED TO MAKE OFFICE VISIT FOR MORE REFILLS 30 tablet 5  . aspirin EC 81 MG EC tablet Take 1 tablet (81 mg total) by mouth daily. (Patient not taking: Reported on 10/11/2020)     No current facility-administered medications on file prior to visit.    Past Medical History:  Diagnosis Date  . Allergy   . Coronary artery disease   . Diet-controlled diabetes mellitus ([place])    AODM  . History of gout   . Hyperlipidemia   .  Hypertension   . Progressive angina ([place]) 08/12/2018   Chest pain  . Stroke ([place]) 11/19/2016   "fully recovered" (08/14/2018)  . Tick bite [id number]   Erlichiosis;Eulis Canner MD, ID    Past Surgical History:  Procedure Laterality Date  . BACK SURGERY    . COLONOSCOPY    . COLONOSCOPY W/ POLYPECTOMY  2010; 2015   Dr Olevia Perches  . CORONARY ARTERY BYPASS GRAFT N/A 08/19/2018   Procedure: CORONARY ARTERY BYPASS GRAFTING (CABG) x 5  USING LEFT INTERNAL MAMMARY ARTERY AND ENDOSCOPICALLY HARVESTED RIGHT SAPHENOUS VEIN.;  Surgeon: Ivin Poot, MD;  Location: [facility];  Service: Open Heart Surgery;  Laterality: N/A;  . EP IMPLANTABLE DEVICE N/A 11/21/2016   Procedure: Loop Recorder Insertion;  Surgeon: Thompson Grayer, MD;  Location: [facility];  Service: Cardiovascular;  Laterality: N/A;  . ESI  2014   @L5 -S1 X3   . EXCISIONAL HEMORRHOIDECTOMY  ~ 1995  . FACIAL RECONSTRUCTION SURGERY Right 1990s   plating & pinning of maxilla  . INGUINAL HERNIA REPAIR Bilateral 1959  . LEFT HEART CATH AND CORONARY ANGIOGRAPHY N/A 08/14/2018   Procedure: LEFT HEART CATH AND CORONARY ANGIOGRAPHY;  Surgeon: Lorretta Harp, MD;  Location: [facility];  Service: Cardiovascular;  Laterality: N/A;  . POLYPECTOMY    . POSTERIOR LUMBAR FUSION  10/17/2013   L5-S1 ; [place]  . TEE WITHOUT CARDIOVERSION N/A 11/21/2016   Procedure: TRANSESOPHAGEAL ECHOCARDIOGRAM (TEE);  Surgeon: Larey Dresser, MD;  Location: [facility];  Service: Cardiovascular;  Laterality: N/A;  . TEE WITHOUT CARDIOVERSION N/A 08/19/2018   Procedure: TRANSESOPHAGEAL ECHOCARDIOGRAM (TEE);  Surgeon: Prescott Gum, Collier Salina, MD;  Location: [facility];  Service: Open Heart Surgery;  Laterality: N/A;    Social History   Socioeconomic History  . Marital status: Married    Spouse name: Not on file  . Number of children: 3  . Years of education: Not on file  . Highest education level: Master's degree (e.g., MA, MS, MEng, MEd, MSW, MBA)  Occupational History  . Not on file  Tobacco Use  . Smoking status: Former Smoker    Packs/day: 0.25    Years: 6.00    Pack years: 1.50    Types: Cigarettes    Quit date: 12/17/1980    Years since quitting: 39.8  . Smokeless tobacco: Never Used  . Tobacco comment: smoked off and on 1966- 1982, up to < 5 cigarettes / day  Vaping Use  . Vaping Use: Never used  Substance and Sexual Activity  . Alcohol use: Yes    Alcohol/week: 4.0 standard drinks    Types: 4 Glasses of wine per week    Comment:  socially  . Drug use: Never  . Sexual activity: Yes  Other  Topics Concern  . Not on file  Social History Narrative   Lives at home with wife    Right handed   Caffeine: decaf coffee   Social Determinants of Health   Financial Resource Strain:   . Difficulty of Paying Living Expenses: Not on file  Food Insecurity:   . Worried About Charity fundraiser in the Last Year: Not on file  . Ran Out of Food in the Last Year: Not on file  Transportation Needs:   . Lack of Transportation (Medical): Not on file  . Lack of Transportation (Non-Medical): Not on file  Physical Activity:   . Days of Exercise per Week: Not on file  . Minutes  of Exercise per Session: Not on file  Stress:   . Feeling of Stress : Not on file  Social Connections:   . Frequency of Communication with Friends and Family: Not on file  . Frequency of Social Gatherings with Friends and Family: Not on file  . Attends Religious Services: Not on file  . Active Member of Clubs or Organizations: Not on file  . Attends Archivist Meetings: Not on file  . Marital Status: Not on file    Family History  Problem Relation Age of Onset  . Heart attack Father 67  . Hypertension Mother   . Stroke Mother        X60  . Diabetes Sister        "pre Diabetes"  . Stroke Maternal Uncle   . Cancer Neg Hx   . Colon cancer Neg Hx   . Rectal cancer Neg Hx   . Stomach cancer Neg Hx   . Colon polyps Neg Hx   . Esophageal cancer Neg Hx   . Headache Neg Hx   . Migraines Neg Hx     Review of Systems  Constitutional: Negative for chills and fever.  HENT: Positive for sinus pain.   Eyes: Negative for visual disturbance.  Respiratory: Negative for cough, shortness of breath and wheezing.   Cardiovascular: Negative for chest pain, palpitations and leg swelling.  Gastrointestinal: Negative for abdominal pain, blood in stool, constipation, diarrhea and nausea.       No gerd  Genitourinary: Negative for difficulty urinating, dysuria and hematuria.  Musculoskeletal: Negative for  arthralgias and back pain.  Skin: Negative for color change and rash.  Neurological: Positive for dizziness (occ if get up quick) and headaches (sinus).  Psychiatric/Behavioral: Negative for dysphoric mood. The patient is not nervous/anxious.        Objective:   Vitals:   10/11/20 1017  BP: 130/76  Pulse: 66  Temp: 98.2 F (36.8 C)  SpO2: 97%   Filed Weights   10/11/20 1017  Weight: 208 lb (94.3 kg)   Body mass index is 28.21 kg/m.  BP Readings from Last 3 Encounters:  10/11/20 130/76  04/26/20 127/72  03/10/20 102/60    Wt Readings from Last 3 Encounters:  10/11/20 208 lb (94.3 kg)  04/26/20 210 lb (95.3 kg)  03/10/20 206 lb 12.8 oz (93.8 kg)     Physical Exam Constitutional: He appears well-developed and well-nourished. No distress.  HENT:  Head: Normocephalic and atraumatic.  Right Ear: External ear normal.  Left Ear: External ear normal.  Mouth/Throat: Oropharynx is clear and moist.  Normal ear canals and TM b/l  Eyes: Conjunctivae and EOM are normal.  Neck: Neck supple. No tracheal deviation present. No thyromegaly present.  No carotid bruit  Cardiovascular: Normal rate, regular rhythm, normal heart sounds and intact distal pulses.   No murmur heard. Pulmonary/Chest: Effort normal and breath sounds normal. No respiratory distress. He has no wheezes. He has no rales.  Abdominal: Soft. He exhibits no distension. There is no tenderness.  Genitourinary: deferred  Musculoskeletal: He exhibits no edema.  Lymphadenopathy:   He has no cervical adenopathy.  Skin: Skin is warm and dry. He is not diaphoretic.  Psychiatric: He has a normal mood and affect. His behavior is normal.         Assessment & Plan:   Physical exam: Screening blood work  ordered Immunizations  Flu vac today, discussed tdap, had covid booster Colonoscopy   Up to date  Eye exams   Up to date  Exercise   walking Weight  Overweight - ok for age Substance abuse   none  See Problem  List for Assessment and Plan of chronic medical problems.   This visit occurred during the SARS-CoV-2 public health emergency.  Safety protocols were in place, including screening questions prior to the visit, additional usage of staff PPE, and extensive cleaning of exam room while observing appropriate contact time as indicated for disinfecting solutions.

## 2020-10-11 ENCOUNTER — Other Ambulatory Visit: Payer: Self-pay

## 2020-10-11 ENCOUNTER — Encounter: Payer: Self-pay | Admitting: Internal Medicine

## 2020-10-11 ENCOUNTER — Ambulatory Visit (INDEPENDENT_AMBULATORY_CARE_PROVIDER_SITE_OTHER): Payer: PPO | Admitting: Internal Medicine

## 2020-10-11 VITALS — BP 130/76 | HR 66 | Temp 98.2°F | Ht 72.0 in | Wt 208.0 lb

## 2020-10-11 DIAGNOSIS — J322 Chronic ethmoidal sinusitis: Secondary | ICD-10-CM

## 2020-10-11 DIAGNOSIS — Z0000 Encounter for general adult medical examination without abnormal findings: Secondary | ICD-10-CM

## 2020-10-11 DIAGNOSIS — Z8673 Personal history of transient ischemic attack (TIA), and cerebral infarction without residual deficits: Secondary | ICD-10-CM | POA: Diagnosis not present

## 2020-10-11 DIAGNOSIS — I480 Paroxysmal atrial fibrillation: Secondary | ICD-10-CM | POA: Diagnosis not present

## 2020-10-11 DIAGNOSIS — E1142 Type 2 diabetes mellitus with diabetic polyneuropathy: Secondary | ICD-10-CM | POA: Diagnosis not present

## 2020-10-11 DIAGNOSIS — E1159 Type 2 diabetes mellitus with other circulatory complications: Secondary | ICD-10-CM | POA: Diagnosis not present

## 2020-10-11 DIAGNOSIS — Z23 Encounter for immunization: Secondary | ICD-10-CM

## 2020-10-11 DIAGNOSIS — I10 Essential (primary) hypertension: Secondary | ICD-10-CM | POA: Diagnosis not present

## 2020-10-11 DIAGNOSIS — E782 Mixed hyperlipidemia: Secondary | ICD-10-CM | POA: Diagnosis not present

## 2020-10-11 DIAGNOSIS — G479 Sleep disorder, unspecified: Secondary | ICD-10-CM

## 2020-10-11 DIAGNOSIS — I25110 Atherosclerotic heart disease of native coronary artery with unstable angina pectoris: Secondary | ICD-10-CM | POA: Diagnosis not present

## 2020-10-11 LAB — CBC WITH DIFFERENTIAL/PLATELET
Basophils Absolute: 0 10*3/uL (ref 0.0–0.1)
Basophils Relative: 0.8 % (ref 0.0–3.0)
Eosinophils Absolute: 0.4 10*3/uL (ref 0.0–0.7)
Eosinophils Relative: 6.3 % — ABNORMAL HIGH (ref 0.0–5.0)
HCT: 40 % (ref 39.0–52.0)
Hemoglobin: 13.8 g/dL (ref 13.0–17.0)
Lymphocytes Relative: 22.2 % (ref 12.0–46.0)
Lymphs Abs: 1.3 10*3/uL (ref 0.7–4.0)
MCHC: 34.5 g/dL (ref 30.0–36.0)
MCV: 88.7 fl (ref 78.0–100.0)
Monocytes Absolute: 0.4 10*3/uL (ref 0.1–1.0)
Monocytes Relative: 6.3 % (ref 3.0–12.0)
Neutro Abs: 3.9 10*3/uL (ref 1.4–7.7)
Neutrophils Relative %: 64.4 % (ref 43.0–77.0)
Platelets: 221 10*3/uL (ref 150.0–400.0)
RBC: 4.51 Mil/uL (ref 4.22–5.81)
RDW: 14.1 % (ref 11.5–15.5)
WBC: 6 10*3/uL (ref 4.0–10.5)

## 2020-10-11 LAB — COMPREHENSIVE METABOLIC PANEL
ALT: 12 U/L (ref 0–53)
AST: 13 U/L (ref 0–37)
Albumin: 4.2 g/dL (ref 3.5–5.2)
Alkaline Phosphatase: 65 U/L (ref 39–117)
BUN: 16 mg/dL (ref 6–23)
CO2: 27 mEq/L (ref 19–32)
Calcium: 9.3 mg/dL (ref 8.4–10.5)
Chloride: 105 mEq/L (ref 96–112)
Creatinine, Ser: 1.05 mg/dL (ref 0.40–1.50)
GFR: 71.73 mL/min (ref >60.00)
Glucose, Bld: 132 mg/dL — ABNORMAL HIGH (ref 70–99)
Potassium: 4.5 mEq/L (ref 3.5–5.1)
Sodium: 138 mEq/L (ref 135–145)
Total Bilirubin: 0.5 mg/dL (ref 0.2–1.2)
Total Protein: 6.9 g/dL (ref 6.0–8.3)

## 2020-10-11 LAB — LIPID PANEL
Cholesterol: 115 mg/dL (ref 0–200)
HDL: 43 mg/dL (ref >39.00)
LDL Cholesterol: 52 mg/dL (ref 0–99)
NonHDL: 72.21
Total CHOL/HDL Ratio: 3
Triglycerides: 100 mg/dL (ref 0.0–149.0)
VLDL: 20 mg/dL (ref 0.0–40.0)

## 2020-10-11 LAB — HEMOGLOBIN A1C: Hgb A1c MFr Bld: 7.1 % — ABNORMAL HIGH (ref 4.6–6.5)

## 2020-10-11 LAB — TSH: TSH: 1.14 u[IU]/mL (ref 0.35–4.50)

## 2020-10-11 MED ORDER — ZOLPIDEM TARTRATE 5 MG PO TABS: ORAL_TABLET | ORAL | 5 refills | Status: DC

## 2020-10-11 NOTE — Assessment & Plan Note (Signed)
Chronic Controlled, stable Continue Ambien 5 mg daily

## 2020-10-11 NOTE — Assessment & Plan Note (Signed)
Chronic Occurred postop after CABG In sinus rhythm today Following with cardiology

## 2020-10-11 NOTE — Assessment & Plan Note (Signed)
Chronic Following with cardiology No anginal symptoms Continue current medications

## 2020-10-11 NOTE — Assessment & Plan Note (Signed)
Chronic Gabapentin was not effective Has numbness and this is tolerable No treatment at this time

## 2020-10-11 NOTE — Assessment & Plan Note (Signed)
Chronic No residual deficits Blood pressure well controlled Sugars have been controlled Continue Plavix 75 mg daily, Crestor 20 mg daily

## 2020-10-11 NOTE — Assessment & Plan Note (Addendum)
Chronic sinus pressure - periorbital Uses neti pot Has seen ENT Uses Flonase

## 2020-10-11 NOTE — Assessment & Plan Note (Signed)
Chronic Controlled Check A1c today Continue Metformin 1000 mg twice daily Would benefit from [place], but this is not covered by his insurance-Will recheck next year to see if it is covered Continue regular exercise, diabetic diet

## 2020-10-11 NOTE — Addendum Note (Signed)
Addended by: Marcina Millard on: 10/11/2020 04:50 PM   Modules accepted: Orders

## 2020-10-11 NOTE — Assessment & Plan Note (Signed)
Chronic BP well controlled Continue hydrochlorothiazide 25 mg daily, metoprolol 25 mg twice daily, ramipril 5 mg daily cmp

## 2020-10-11 NOTE — Assessment & Plan Note (Signed)
Chronic Check lipid panel  Continue Crestor 20 mg daily Regular exercise and healthy diet encouraged  

## 2020-10-12 ENCOUNTER — Encounter: Payer: Self-pay | Admitting: Internal Medicine

## 2020-10-12 MED ORDER — RYBELSUS 3 MG PO TABS: 3.0000 mg | ORAL_TABLET | Freq: Every day | ORAL | 2 refills | Status: DC

## 2020-11-01 ENCOUNTER — Telehealth (INDEPENDENT_AMBULATORY_CARE_PROVIDER_SITE_OTHER): Payer: PPO | Admitting: Internal Medicine

## 2020-11-01 ENCOUNTER — Encounter: Payer: Self-pay | Admitting: Internal Medicine

## 2020-11-01 DIAGNOSIS — J0100 Acute maxillary sinusitis, unspecified: Secondary | ICD-10-CM

## 2020-11-01 DIAGNOSIS — J019 Acute sinusitis, unspecified: Secondary | ICD-10-CM | POA: Insufficient documentation

## 2020-11-01 MED ORDER — AMOXICILLIN-POT CLAVULANATE 875-125 MG PO TABS: 1.0000 | ORAL_TABLET | Freq: Two times a day (BID) | ORAL | 0 refills | Status: DC

## 2020-11-01 MED ORDER — METHYLPREDNISOLONE ACETATE 80 MG/ML IJ SUSP
80.0000 mg | Freq: Once | INTRAMUSCULAR | Status: AC
  Administered 2020-11-01: 80 mg via INTRAMUSCULAR

## 2020-11-01 NOTE — Addendum Note (Signed)
Addended by: Elza Rafter D on: 11/01/2020 02:13 PM   Modules accepted: Orders

## 2020-11-01 NOTE — Assessment & Plan Note (Signed)
Acute Concern for bacterial cause-starting to move in the chest We will start Augmentin 875-125 mg twice daily x10 days Has done well with steroid shot in the past-this has been the most effective for relieving some of the pain and pressure and he would like to try that again.  Since he has had a negative Covid test okay to have him come into the office today-Depo-Medrol 80 mg IM x1 Continue over-the-counter cold and allergy medications Call if no improvement

## 2020-11-01 NOTE — Progress Notes (Signed)
Pt received Depmedrol 80mg  right ventroglueal per Dr Quay Burow.  Pt tolerated well.

## 2020-11-01 NOTE — Progress Notes (Signed)
Virtual Visit via Video Note  I connected with Alex Bryant on 11/01/20 at 10:30 AM EST by a video enabled telemedicine application and verified that I am speaking with the correct person using two identifiers.   I discussed the limitations of evaluation and management by telemedicine and the availability of in person appointments. The patient expressed understanding and agreed to proceed.  Present for the visit:  Myself, Dr Billey Gosling, Evaristo Bury.  The patient is currently at home and I am in the office.    No referring provider.    History of Present Illness: He is here for an acute visit.   He has been battling a sinus infection for some time.   His sinus issues are worse over the past 3 weeks. Over last week he has developed a bad chest cold.  He states significant nasal congestion, sinus pain, sore throat, cough and he is starting to bring up some sputum and significant headaches.  The headaches can be severe and they only seem to go away when he lays down.  He has not had any fevers or chills.  He has been dealing with the sinus issues for a while.  He did a Covid test last Sunday and it was negative.   He is using the flonse.     Review of Systems  Constitutional: Negative for chills and fever.  HENT: Positive for congestion, sinus pain and sore throat. Negative for ear pain.   Respiratory: Positive for cough and sputum production. Negative for shortness of breath and wheezing.   Neurological: Positive for headaches (severe - goes away when he lays down).      Social History   Socioeconomic History  . Marital status: Married    Spouse name: Not on file  . Number of children: 3  . Years of education: Not on file  . Highest education level: Master's degree (e.g., MA, MS, MEng, MEd, MSW, MBA)  Occupational History  . Not on file  Tobacco Use  . Smoking status: Former Smoker    Packs/day: 0.25    Years: 6.00    Pack years: 1.50    Types: Cigarettes    Quit date:  12/17/1980    Years since quitting: 39.9  . Smokeless tobacco: Never Used  . Tobacco comment: smoked off and on 1966- 1982, up to < 5 cigarettes / day  Vaping Use  . Vaping Use: Never used  Substance and Sexual Activity  . Alcohol use: Yes    Alcohol/week: 4.0 standard drinks    Types: 4 Glasses of wine per week    Comment:  socially  . Drug use: Never  . Sexual activity: Yes  Other Topics Concern  . Not on file  Social History Narrative   Lives at home with wife    Right handed   Caffeine: decaf coffee   Social Determinants of Health   Financial Resource Strain:   . Difficulty of Paying Living Expenses: Not on file  Food Insecurity:   . Worried About Charity fundraiser in the Last Year: Not on file  . Ran Out of Food in the Last Year: Not on file  Transportation Needs:   . Lack of Transportation (Medical): Not on file  . Lack of Transportation (Non-Medical): Not on file  Physical Activity:   . Days of Exercise per Week: Not on file  . Minutes of Exercise per Session: Not on file  Stress:   . Feeling of Stress : Not  on file  Social Connections:   . Frequency of Communication with Friends and Family: Not on file  . Frequency of Social Gatherings with Friends and Family: Not on file  . Attends Religious Services: Not on file  . Active Member of Clubs or Organizations: Not on file  . Attends Archivist Meetings: Not on file  . Marital Status: Not on file     Observations/Objective: Appears well in NAD Breathing normally Skin appears warm and dry  Assessment and Plan:  See Problem List for Assessment and Plan of chronic medical problems.   Follow Up Instructions:    I discussed the assessment and treatment plan with the patient. The patient was provided an opportunity to ask questions and all were answered. The patient agreed with the plan and demonstrated an understanding of the instructions.   The patient was advised to call back or seek an in-person  evaluation if the symptoms worsen or if the condition fails to improve as anticipated.    Binnie Rail, MD

## 2020-11-02 ENCOUNTER — Ambulatory Visit: Payer: PPO | Admitting: Internal Medicine

## 2020-11-08 ENCOUNTER — Encounter: Payer: Self-pay | Admitting: Internal Medicine

## 2020-11-08 DIAGNOSIS — H25813 Combined forms of age-related cataract, bilateral: Secondary | ICD-10-CM | POA: Diagnosis not present

## 2020-11-08 DIAGNOSIS — R519 Headache, unspecified: Secondary | ICD-10-CM | POA: Diagnosis not present

## 2020-11-08 DIAGNOSIS — E113293 Type 2 diabetes mellitus with mild nonproliferative diabetic retinopathy without macular edema, bilateral: Secondary | ICD-10-CM | POA: Diagnosis not present

## 2020-11-08 DIAGNOSIS — J209 Acute bronchitis, unspecified: Secondary | ICD-10-CM | POA: Diagnosis not present

## 2020-11-08 DIAGNOSIS — J019 Acute sinusitis, unspecified: Secondary | ICD-10-CM | POA: Diagnosis not present

## 2020-11-08 DIAGNOSIS — H40011 Open angle with borderline findings, low risk, right eye: Secondary | ICD-10-CM | POA: Diagnosis not present

## 2020-11-08 DIAGNOSIS — H35033 Hypertensive retinopathy, bilateral: Secondary | ICD-10-CM | POA: Diagnosis not present

## 2020-11-14 ENCOUNTER — Ambulatory Visit: Payer: PPO | Admitting: Family Medicine

## 2020-11-14 ENCOUNTER — Other Ambulatory Visit: Payer: Self-pay

## 2020-11-14 ENCOUNTER — Encounter: Payer: Self-pay | Admitting: Family Medicine

## 2020-11-14 VITALS — BP 168/82 | HR 63 | Ht 72.0 in | Wt 208.0 lb

## 2020-11-14 DIAGNOSIS — R51 Headache with orthostatic component, not elsewhere classified: Secondary | ICD-10-CM

## 2020-11-14 DIAGNOSIS — G44229 Chronic tension-type headache, not intractable: Secondary | ICD-10-CM | POA: Diagnosis not present

## 2020-11-14 MED ORDER — TOPIRAMATE 50 MG PO TABS: 50.0000 mg | ORAL_TABLET | Freq: Two times a day (BID) | ORAL | 3 refills | Status: DC

## 2020-11-14 NOTE — Progress Notes (Addendum)
Chief Complaint  Patient presents with  . Follow-up    corner rm  . Headache    pt said he is here for worsening headaches in the las 4-5 months     HISTORY OF PRESENT ILLNESS: Today 11/14/20  Alex Bryant is a 69 y.o. male here today for follow up for worsening headaches. MRI in 04/2020 was unremarkable with exception of paranasal sinus disease and left mastoid effusion.    Headaches have continued. He has a constant headache every day. He describes a dull pain across the front of his head. Sometimes stabbing. Worse in the afternoons or when he bends forward or coughs. Headache improves when he lies flat. He wakes up feeling pretty good. He is sleeping well. He has had one event where he felt that he saw stars in both eyes for a couple of seconds. He does not drink much water. His blood pressures are usually 130's/70's.   He has been to see two ENT providers. He was told that he had a deviated septum but otherwise unremarkable exam. He did not feel treatment plan with nasal sprays were helpful. He does feel that steroid injections and antibiotics help more than anything.    HISTORY (copied from previous note)  HPI:  Alex Bryant is a 69 y.o. male here as requested by Binnie Rail, MD for headaches. PMHx CVA, CAD s/p CABG, HLD,, PAF, HTN, Diabetes, diabetic neuropathy. He has always had ocassional seasonal allergies and they manifest as a dull headache in the face. 2 months ago he started having severe headache again in the same area, they would manifest in the morning 930-10 and then get progressively worse until 7pm that evening and would get better only after he laid down. That has been the cycle. He has had several "shots" and treatments of prednisone and it got a little better with the prednisone but it is back. When he bends over he can feel the pain. He just woke up one day and it started. Behind the eyes and the nose, it is a dull, really throbbing kind of pain. No light or  sound sensitivity, no nausea, no inciting events, no neck or head trauma. The main point is it is only worsening when standing up. If he were to bend over it gets a lot worse. Every single day. His mother had migraines. He has never had migraines or this kind of headache this severe, 7-8/10. He tried nasal spray, antihistamine didn;t help, no ear oain, no drainage, no neck pain. He has congestion and drainage comes out of the nose, it gets worse as the day goes on. Congestion. No other focal neurologic deficits, associated symptoms, inciting events or modifiable factors.  Reviewed notes, labs and imaging from outside physicians, which showed:  Personally reviewed CT Maxillofacial images and agree the paranasal sinuses are essentially clear.     REVIEW OF SYSTEMS: Out of a complete 14 system review of symptoms, the patient complains only of the following symptoms, headaches, chronic sinusitis and all other reviewed systems are negative.   ALLERGIES: Allergies  Allergen Reactions  . Oxycodone Anxiety    [name], confused, and constipation     HOME MEDICATIONS: Outpatient Medications Prior to Visit  Medication Sig Dispense Refill  . clopidogrel (PLAVIX) 75 MG tablet TAKE 1 TABLET BY MOUTH DAILY 90 tablet 1  . fluticasone (FLONASE) 50 MCG/ACT nasal spray Place 2 sprays into both nostrils daily.    Marland Kitchen glucose blood test strip 1  each by Other route as needed. Use as instructed     . hydrochlorothiazide (HYDRODIURIL) 25 MG tablet Take 1 tablet (25 mg total) by mouth daily. 30 tablet 3  . Lancets (FREESTYLE) lancets 1 each by Other route 2 (two) times daily. Use to help check blood sugar twice a day. Dx E11.9 100 each 3  . metFORMIN (GLUCOPHAGE) 1000 MG tablet Take 1 tablet (1,000 mg total) by mouth 2 (two) times daily with a meal. Overdue for Annual appt must see provider for future refills 60 tablet 0  . metoprolol tartrate (LOPRESSOR) 25 MG tablet Take 1 tablet (25 mg total) by mouth 2 (two)  times daily. Overdue for Annual appt must see provider for future refills 60 tablet 0  . Omega-3 Fatty Acids (FISH OIL) 1000 MG CAPS Take 1 capsule (1,000 mg total) by mouth daily.  0  . ramipril (ALTACE) 5 MG capsule TAKE 2 CAPSULES(10 MG) BY MOUTH DAILY 180 capsule 0  . rosuvastatin (CRESTOR) 20 MG tablet Take 1 tablet (20 mg total) by mouth daily. 30 tablet 1  . Semaglutide (RYBELSUS) 3 MG TABS Take 3 mg by mouth daily before breakfast. 30 tablet 2  . zolpidem (AMBIEN) 5 MG tablet TAKE 1 TABLET BY MOUTH AT BEDTIME AS NEEDED. 30 tablet 5  . amoxicillin-clavulanate (AUGMENTIN) 875-125 MG tablet Take 1 tablet by mouth 2 (two) times daily. 20 tablet 0   No facility-administered medications prior to visit.     PAST MEDICAL HISTORY: Past Medical History:  Diagnosis Date  . Allergy   . Coronary artery disease   . Diet-controlled diabetes mellitus ([place])    AODM  . History of gout   . Hyperlipidemia   . Hypertension   . Progressive angina ([place]) 08/12/2018   Chest pain  . Stroke ([place]) 11/19/2016   "fully recovered" (08/14/2018)  . 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 BACK SURGERY    . COLONOSCOPY    . COLONOSCOPY W/ POLYPECTOMY  2010; 2015   Dr Olevia Perches  . CORONARY ARTERY BYPASS GRAFT N/A 08/19/2018   Procedure: CORONARY ARTERY BYPASS GRAFTING (CABG) x 5  USING LEFT INTERNAL MAMMARY ARTERY AND ENDOSCOPICALLY HARVESTED RIGHT SAPHENOUS VEIN.;  Surgeon: Ivin Poot, MD;  Location: [facility];  Service: Open Heart Surgery;  Laterality: N/A;  . EP IMPLANTABLE DEVICE N/A 11/21/2016   Procedure: Loop Recorder Insertion;  Surgeon: Thompson Grayer, MD;  Location: [facility];  Service: Cardiovascular;  Laterality: N/A;  . ESI  2014   @L5 -S1 X3   . EXCISIONAL HEMORRHOIDECTOMY  ~ 1995  . FACIAL RECONSTRUCTION SURGERY Right 1990s   plating & pinning of maxilla  . INGUINAL HERNIA REPAIR Bilateral 1959  .  LEFT HEART CATH AND CORONARY ANGIOGRAPHY N/A 08/14/2018   Procedure: LEFT HEART CATH AND CORONARY ANGIOGRAPHY;  Surgeon: Lorretta Harp, MD;  Location: [facility];  Service: Cardiovascular;  Laterality: N/A;  . POLYPECTOMY    . POSTERIOR LUMBAR FUSION  10/17/2013   L5-S1 ; [place]  . TEE WITHOUT CARDIOVERSION N/A 11/21/2016   Procedure: TRANSESOPHAGEAL ECHOCARDIOGRAM (TEE);  Surgeon: Larey Dresser, MD;  Location: [facility];  Service: Cardiovascular;  Laterality: N/A;  . TEE WITHOUT CARDIOVERSION N/A 08/19/2018   Procedure: TRANSESOPHAGEAL ECHOCARDIOGRAM (TEE);  Surgeon: Prescott Gum, Collier Salina, MD;  Location: [facility];  Service: Open Heart Surgery;  Laterality: N/A;  FAMILY HISTORY: Family History  Problem Relation Age of Onset  . Heart attack Father 34  . Hypertension Mother   . Stroke Mother        X42  . Diabetes Sister        "pre Diabetes"  . Stroke Maternal Uncle   . Cancer Neg Hx   . Colon cancer Neg Hx   . Rectal cancer Neg Hx   . Stomach cancer Neg Hx   . Colon polyps Neg Hx   . Esophageal cancer Neg Hx   . Headache Neg Hx   . Migraines Neg Hx      SOCIAL HISTORY: Social History   Socioeconomic History  . Marital status: Married    Spouse name: Not on file  . Number of children: 3  . Years of education: Not on file  . Highest education level: Master's degree (e.g., MA, MS, MEng, MEd, MSW, MBA)  Occupational History  . Not on file  Tobacco Use  . Smoking status: Former Smoker    Packs/day: 0.25    Years: 6.00    Pack years: 1.50    Types: Cigarettes    Quit date: 12/17/1980    Years since quitting: 39.9  . Smokeless tobacco: Never Used  . Tobacco comment: smoked off and on 1966- 1982, up to < 5 cigarettes / day  Vaping Use  . Vaping Use: Never used  Substance and Sexual Activity  . Alcohol use: Yes    Alcohol/week: 4.0 standard drinks    Types: 4 Glasses of wine per week    Comment:  socially  . Drug use: Never  . Sexual activity:  Yes  Other Topics Concern  . Not on file  Social History Narrative   Lives at home with wife    Right handed   Caffeine: decaf coffee   Social Determinants of Health   Financial Resource Strain:   . Difficulty of Paying Living Expenses: Not on file  Food Insecurity:   . Worried About Charity fundraiser in the Last Year: Not on file  . Ran Out of Food in the Last Year: Not on file  Transportation Needs:   . Lack of Transportation (Medical): Not on file  . Lack of Transportation (Non-Medical): Not on file  Physical Activity:   . Days of Exercise per Week: Not on file  . Minutes of Exercise per Session: Not on file  Stress:   . Feeling of Stress : Not on file  Social Connections:   . Frequency of Communication with Friends and Family: Not on file  . Frequency of Social Gatherings with Friends and Family: Not on file  . Attends Religious Services: Not on file  . Active Member of Clubs or Organizations: Not on file  . Attends Archivist Meetings: Not on file  . Marital Status: Not on file  Intimate Partner Violence:   . Fear of Current or Ex-Partner: Not on file  . Emotionally Abused: Not on file  . Physically Abused: Not on file  . Sexually Abused: Not on file      PHYSICAL EXAM  Vitals:   11/14/20 1327  BP: (!) 168/82  Pulse: 63  Weight: 208 lb (94.3 kg)  Height: 6' (1.829 m)   Body mass index is 28.21 kg/m.   Generalized: Well developed, in no acute distress  Cardiology: normal rate and rhythm, no murmur auscultated  Respiratory: clear to auscultation bilaterally    Neurological examination  Mentation: Alert oriented  to time, place, history taking. Follows all commands speech and language fluent Cranial nerve II-XII: Pupils were equal round reactive to light. Extraocular movements were full, visual field were full on confrontational test. Facial sensation and strength were normal. Head turning and shoulder shrug  were normal and symmetric. Motor:  The motor testing reveals 5 over 5 strength of all 4 extremities. Good symmetric motor tone is noted throughout.  Sensory: Sensory testing is intact to soft touch on all 4 extremities. No evidence of extinction is noted.  Gait and station: Gait is normal.     DIAGNOSTIC DATA (LABS, IMAGING, TESTING) - I reviewed patient records, labs, notes, testing and imaging myself where available.  Lab Results  Component Value Date   WBC 6.0 10/11/2020   HGB 13.8 10/11/2020   HCT 40.0 10/11/2020   MCV 88.7 10/11/2020   PLT 221.0 10/11/2020      Component Value Date/Time   NA 138 10/11/2020 1051   NA 138 08/12/2018 1437   K 4.5 10/11/2020 1051   CL 105 10/11/2020 1051   CO2 27 10/11/2020 1051   GLUCOSE 132 (H) 10/11/2020 1051   BUN 16 10/11/2020 1051   BUN 15 08/12/2018 1437   CREATININE 1.05 10/11/2020 1051   CALCIUM 9.3 10/11/2020 1051   PROT 6.9 10/11/2020 1051   PROT 6.4 04/26/2020 0836   ALBUMIN 4.2 10/11/2020 1051   ALBUMIN 4.2 12/18/2018 0907   AST 13 10/11/2020 1051   ALT 12 10/11/2020 1051   ALKPHOS 65 10/11/2020 1051   BILITOT 0.5 10/11/2020 1051   BILITOT 0.4 12/18/2018 0907   GFRNONAA >60 08/23/2018 0259   GFRAA >60 08/23/2018 0259   Lab Results  Component Value Date   CHOL 115 10/11/2020   HDL 43.00 10/11/2020   LDLCALC 52 10/11/2020   LDLDIRECT 153.3 09/08/2007   TRIG 100.0 10/11/2020   CHOLHDL 3 10/11/2020   Lab Results  Component Value Date   HGBA1C 7.1 (H) 10/11/2020   Lab Results  Component Value Date   VZDGLOVF64 332 04/26/2020   Lab Results  Component Value Date   TSH 1.14 10/11/2020      ASSESSMENT AND PLAN  69 y.o. year old male  has a past medical history of Allergy, Coronary artery disease, Diet-controlled diabetes mellitus ([place]), History of gout, Hyperlipidemia, Hypertension, Progressive angina ([place]) (08/12/2018), Stroke ([place]) (11/19/2016), and Tick bite (2001). here with   Chronic tension-type headache, not intractable  Positional  headache - Plan: topiramate (TOPAMAX) 50 MG tablet  Mr. Busch reports continued daily headaches despite multiple evaluations from ENT and primary care. Sound very much like allergies/sinus headaches. But headaches do worsen when he bends forward or if he coughs.  Headache is better when he lies down at night.  We have discussed possible etiology including blood pressure headaches versus tension headaches and allergies.  He does not report migrainous features.  I have discussed previous recommendations for work-up of low csf pressure headaches including blood patch and/or CT myelogram.  He is hesitant to consider these procedures at this time.  He wishes to try an oral agent to see if he can get better control of his headaches.  He is already on metoprolol.  We will try low-dose of topiramate.  He will start with 25 mg once daily for 1 to 2 weeks then increase to 50 mg daily.  I have educated him on possible side effects with topiramate.  He will call me with any concerns.  He will continue to monitor  his blood pressure very closely.  It is elevated today but appears that it is normally around 130/70.  He will also continue follow-up with ENT.  He reports that he cannot tolerate antihistamines.  He has not had much success with nasal steroids.  Steroids and antibiotics seem to be the only thing that helps.  We have discussed trying Mucinex over-the-counter.  I have also educated him on the importance of water intake.  He is currently not drinking water routinely.  I have encouraged him to try to increase water intake to 50 to 60 ounces daily.  We will follow-up in 3 months, sooner if needed.  Will revisit blood pressure work-up at that time if no improvement with topiramate.  He verbalizes understanding and agreement with this plan.  I spent 30 minutes of face-to-face and non-face-to-face time with patient.  This included previsit chart review, lab review, study review, order entry, electronic health record  documentation, patient education. Debbora Presto, MSN, FNP-C 11/14/2020, 2:40 PM  [facility] [address], [place], [place] [address] [phone]  Doesn't sound like any primary headache disorders really sounds like it is more related to sinuses and allergies. Agree that he has a positional quality that can be further evaluated for low-csf headache if needed.  Made any corrections needed, and agree with history, physical, neuro exam,assessment and plan as stated.     Sarina Ill, MD [facility]

## 2020-11-14 NOTE — Patient Instructions (Addendum)
Below is our plan:  We will try a low dose of topiramate. Take 1/2 tablet every night at bedtime for 1-2 weeks. If well tolerated, increase to 1 tablet (50mg ) daily.   Please make sure you are staying well hydrated. I recommend 50-60 ounces daily. Well balanced diet and regular exercise encouraged.    Please continue follow up with care team as directed.   Follow up in 3 months   You may receive a survey regarding today's visit. I encourage you to leave honest feed back as I do use this information to improve patient care. Thank you for seeing me today!   Topiramate tablets What is this medicine? TOPIRAMATE (toe PYRE a mate) is used to treat seizures in adults or children with epilepsy. It is also used for the prevention of migraine headaches. This medicine may be used for other purposes; ask your health care provider or pharmacist if you have questions. COMMON BRAND NAME(S): Topamax, Topiragen What should I tell my health care provider before I take this medicine? They need to know if you have any of these conditions:  bleeding disorders  kidney disease  lung or breathing disease, like asthma  suicidal thoughts, plans, or attempt; a previous suicide attempt by you or a family member  an unusual or allergic reaction to topiramate, other medicines, foods, dyes, or preservatives  pregnant or trying to get pregnant  breast-feeding How should I use this medicine? Take this medicine by mouth with a glass of water. Follow the directions on the prescription label. Do not cut, crush or chew this medicine. Swallow the tablets whole. You can take it with or without food. If it upsets your stomach, take it with food. Take your medicine at regular intervals. Do not take it more often than directed. Do not stop taking except on your doctor's advice. A special MedGuide will be given to you by the pharmacist with each prescription and refill. Be sure to read this information carefully each  time. Talk to your pediatrician regarding the use of this medicine in children. While this drug may be prescribed for children as young as 47 years of age for selected conditions, precautions do apply. Overdosage: If you think you have taken too much of this medicine contact a poison control center or emergency room at once. NOTE: This medicine is only for you. Do not share this medicine with others. What if I miss a dose? If you miss a dose, take it as soon as you can. If your next dose is to be taken in less than 6 hours, then do not take the missed dose. Take the next dose at your regular time. Do not take double or extra doses. What may interact with this medicine? This medicine may interact with the following medications:  acetazolamide  alcohol  antihistamines for allergy, cough, and cold  aspirin and aspirin-like medicines  atropine  birth control pills  certain medicines for anxiety or sleep  certain medicines for bladder problems like oxybutynin, tolterodine  certain medicines for depression like amitriptyline, fluoxetine, sertraline  certain medicines for seizures like carbamazepine, phenobarbital, phenytoin, primidone, valproic acid, zonisamide  certain medicines for stomach problems like dicyclomine, hyoscyamine  certain medicines for travel sickness like scopolamine  certain medicines for Parkinson's disease like benztropine, trihexyphenidyl  certain medicines that treat or prevent blood clots like warfarin, enoxaparin, dalteparin, apixaban, dabigatran, and rivaroxaban  digoxin  general anesthetics like halothane, isoflurane, methoxyflurane, propofol  hydrochlorothiazide  ipratropium  lithium  medicines that  relax muscles for surgery  metformin  narcotic medicines for pain  NSAIDs, medicines for pain and inflammation, like ibuprofen or naproxen  phenothiazines like chlorpromazine, mesoridazine, prochlorperazine, thioridazine  pioglitazone This  list may not describe all possible interactions. Give your health care provider a list of all the medicines, herbs, non-prescription drugs, or dietary supplements you use. Also tell them if you smoke, drink alcohol, or use illegal drugs. Some items may interact with your medicine. What should I watch for while using this medicine? Visit your doctor or health care professional for regular checks on your progress. Tell your health care professional if your symptoms do not start to get better or if they get worse. Do not stop taking except on your health care professional's advice. You may develop a severe reaction. Your health care professional will tell you how much medicine to take. Wear a medical ID bracelet or chain. Carry a card that describes your disease and details of your medicine and dosage times. This medicine can reduce the response of your body to heat or cold. Dress warm in cold weather and stay hydrated in hot weather. If possible, avoid extreme temperatures like saunas, hot tubs, very hot or cold showers, or activities that can cause dehydration such as vigorous exercise. Check with your health care professional if you have severe diarrhea, nausea, and vomiting, or if you sweat a lot. The loss of too much body fluid may make it dangerous for you to take this medicine. You may get drowsy or dizzy. Do not drive, use machinery, or do anything that needs mental alertness until you know how this medicine affects you. Do not stand up or sit up quickly, especially if you are an older patient. This reduces the risk of dizzy or fainting spells. Alcohol may interfere with the effect of this medicine. Avoid alcoholic drinks. Tell your health care professional right away if you have any change in your eyesight. Patients and their families should watch out for new or worsening depression or thoughts of suicide. Also watch out for sudden changes in feelings such as feeling anxious, agitated, panicky,  irritable, hostile, aggressive, impulsive, severely restless, overly excited and hyperactive, or not being able to sleep. If this happens, especially at the beginning of treatment or after a change in dose, call your healthcare professional. This medicine may cause serious skin reactions. They can happen weeks to months after starting the medicine. Contact your health care provider right away if you notice fevers or flu-like symptoms with a rash. The rash may be red or purple and then turn into blisters or peeling of the skin. Or, you might notice a red rash with swelling of the face, lips or lymph nodes in your neck or under your arms. Birth control may not work properly while you are taking this medicine. Talk to your health care professional about using an extra method of birth control. Women should inform their health care professional if they wish to become pregnant or think they might be pregnant. There is a potential for serious side effects and harm to an unborn child. Talk to your health care professional for more information. What side effects may I notice from receiving this medicine? Side effects that you should report to your doctor or health care professional as soon as possible:  allergic reactions like skin rash, itching or hives, swelling of the face, lips, or tongue  blood in the urine  changes in vision  confusion  loss of memory  pain  in lower back or side  pain when urinating  redness, blistering, peeling or loosening of the skin, including inside the mouth  signs and symptoms of bleeding such as bloody or black, tarry stools; red or dark brown urine; spitting up blood or brown material that looks like coffee grounds; red spots on the skin; unusual bruising or bleeding from the eyes, gums, or nose  signs and symptoms of increased acid in the body like breathing fast; fast heartbeat; headache; confusion; unusually weak or tired; nausea, vomiting  suicidal thoughts, mood  changes  trouble speaking or understanding  unusual sweating  unusually weak or tired Side effects that usually do not require medical attention (report to your doctor or health care professional if they continue or are bothersome):  dizziness  drowsiness  fever  loss of appetite  nausea, vomiting  pain, tingling, numbness in the hands or feet  stomach pain  tiredness  upset stomach This list may not describe all possible side effects. Call your doctor for medical advice about side effects. You may report side effects to FDA at 1-800-FDA-1088. Where should I keep my medicine? Keep out of the reach of children. Store at room temperature between 15 and 30 degrees C (59 and 86 degrees F). Throw away any unused medicine after the expiration date. NOTE: This sheet is a summary. It may not cover all possible information. If you have questions about this medicine, talk to your doctor, pharmacist, or health care provider.  2020 Elsevier/Gold Standard (2019-07-02 15:07:20)    General Headache Without Cause A headache is pain or discomfort that is felt around the head or neck area. There are many causes and types of headaches. In some cases, the cause may not be found. Follow these instructions at home: Watch your condition for any changes. Let your doctor know about them. Take these steps to help with your condition: Managing pain      Take over-the-counter and prescription medicines only as told by your doctor.  Lie down in a dark, quiet room when you have a headache.  If told, put ice on your head and neck area: ? Put ice in a plastic bag. ? Place a towel between your skin and the bag. ? Leave the ice on for 20 minutes, 2-3 times per day.  If told, put heat on the affected area. Use the heat source that your doctor recommends, such as a moist heat pack or a heating pad. ? Place a towel between your skin and the heat source. ? Leave the heat on for 20-30  minutes. ? Remove the heat if your skin turns bright red. This is very important if you are unable to feel pain, heat, or cold. You may have a greater risk of getting burned.  Keep lights dim if bright lights bother you or make your headaches worse. Eating and drinking  Eat meals on a regular schedule.  If you drink alcohol: ? Limit how much you use to:  0-1 drink a day for women.  0-2 drinks a day for men. ? Be aware of how much alcohol is in your drink. In the [place] one drink equals one 12 oz bottle of beer (355 mL), one 5 oz glass of wine (148 mL), or one 1 oz glass of hard liquor (44 mL).  Stop drinking caffeine, or reduce how much caffeine you drink. General instructions   Keep a journal to find out if certain things bring on headaches. For example, write down: ?  What you eat and drink. ? How much sleep you get. ? Any change to your diet or medicines.  Get a massage or try other ways to relax.  Limit stress.  Sit up straight. Do not tighten (tense) your muscles.  Do not use any products that contain nicotine or tobacco. This includes cigarettes, e-cigarettes, and chewing tobacco. If you need help quitting, ask your doctor.  Exercise regularly as told by your doctor.  Get enough sleep. This often means 7-9 hours of sleep each night.  Keep all follow-up visits as told by your doctor. This is important. Contact a doctor if:  Your symptoms are not helped by medicine.  You have a headache that feels different than the other headaches.  You feel sick to your stomach (nauseous) or you throw up (vomit).  You have a fever. Get help right away if:  Your headache gets very bad quickly.  Your headache gets worse after a lot of physical activity.  You keep throwing up.  You have a stiff neck.  You have trouble seeing.  You have trouble speaking.  You have pain in the eye or ear.  Your muscles are weak or you lose muscle control.  You lose your balance or have  trouble walking.  You feel like you will pass out (faint) or you pass out.  You are mixed up (confused).  You have a seizure. Summary  A headache is pain or discomfort that is felt around the head or neck area.  There are many causes and types of headaches. In some cases, the cause may not be found.  Keep a journal to help find out what causes your headaches. Watch your condition for any changes. Let your doctor know about them.  Contact a doctor if you have a headache that is different from usual, or if your headache is not helped by medicine.  Get help right away if your headache gets very bad, you throw up, you have trouble seeing, you lose your balance, or you have a seizure. This information is not intended to replace advice given to you by your health care provider. Make sure you discuss any questions you have with your health care provider. Document Revised: 06/23/2018 Document Reviewed: 06/23/2018 Elsevier Patient Education  [place].

## 2020-11-28 ENCOUNTER — Other Ambulatory Visit: Payer: Self-pay | Admitting: Internal Medicine

## 2020-12-02 DIAGNOSIS — Z6828 Body mass index (BMI) 28.0-28.9, adult: Secondary | ICD-10-CM | POA: Diagnosis not present

## 2020-12-02 DIAGNOSIS — G43719 Chronic migraine without aura, intractable, without status migrainosus: Secondary | ICD-10-CM | POA: Diagnosis not present

## 2020-12-06 ENCOUNTER — Other Ambulatory Visit: Payer: Self-pay | Admitting: Internal Medicine

## 2020-12-14 ENCOUNTER — Other Ambulatory Visit: Payer: Self-pay | Admitting: Internal Medicine

## 2020-12-21 ENCOUNTER — Other Ambulatory Visit: Payer: Self-pay | Admitting: Internal Medicine

## 2020-12-23 ENCOUNTER — Other Ambulatory Visit: Payer: Self-pay

## 2020-12-23 MED ORDER — ROSUVASTATIN CALCIUM 20 MG PO TABS: 20.0000 mg | ORAL_TABLET | Freq: Every day | ORAL | 0 refills | Status: DC

## 2020-12-26 DIAGNOSIS — H35033 Hypertensive retinopathy, bilateral: Secondary | ICD-10-CM | POA: Diagnosis not present

## 2020-12-26 DIAGNOSIS — H25813 Combined forms of age-related cataract, bilateral: Secondary | ICD-10-CM | POA: Diagnosis not present

## 2020-12-26 DIAGNOSIS — H40011 Open angle with borderline findings, low risk, right eye: Secondary | ICD-10-CM | POA: Diagnosis not present

## 2020-12-26 DIAGNOSIS — E113293 Type 2 diabetes mellitus with mild nonproliferative diabetic retinopathy without macular edema, bilateral: Secondary | ICD-10-CM | POA: Diagnosis not present

## 2021-01-08 ENCOUNTER — Other Ambulatory Visit: Payer: Self-pay | Admitting: Internal Medicine

## 2021-01-09 ENCOUNTER — Telehealth: Payer: Self-pay | Admitting: Family Medicine

## 2021-01-09 ENCOUNTER — Other Ambulatory Visit: Payer: Self-pay

## 2021-01-09 MED ORDER — CLOPIDOGREL BISULFATE 75 MG PO TABS: 75.0000 mg | ORAL_TABLET | Freq: Every day | ORAL | 1 refills | Status: DC

## 2021-01-09 NOTE — Telephone Encounter (Signed)
Pt. states he needs a referral for a headache specialist in [facility] from Dr. Jaynee Eagles. He states the specialist's name is Dr. Ulice Dash & their fax number is [phone].

## 2021-01-10 ENCOUNTER — Other Ambulatory Visit: Payer: Self-pay | Admitting: Family Medicine

## 2021-01-10 DIAGNOSIS — G44221 Chronic tension-type headache, intractable: Secondary | ICD-10-CM

## 2021-01-10 NOTE — Telephone Encounter (Signed)
I will be happy to help. TY.

## 2021-01-11 DIAGNOSIS — H5713 Ocular pain, bilateral: Secondary | ICD-10-CM | POA: Diagnosis not present

## 2021-01-11 DIAGNOSIS — G44229 Chronic tension-type headache, not intractable: Secondary | ICD-10-CM | POA: Diagnosis not present

## 2021-01-12 NOTE — Telephone Encounter (Signed)
Spoke to patient on 01/11/2021 Patient has information   have sent referral to [facility] Dr. Ulice Dash telephone [phone] - fax [phone]   . My direct contact as well .

## 2021-01-16 DIAGNOSIS — H5713 Ocular pain, bilateral: Secondary | ICD-10-CM | POA: Diagnosis not present

## 2021-02-08 ENCOUNTER — Telehealth: Payer: Self-pay | Admitting: Internal Medicine

## 2021-02-08 NOTE — Progress Notes (Signed)
°  Chronic Care Management   Outreach Note  02/08/2021 Name: Alex Bryant MRN: [id number] DOB: 22-Jun-1951  Referred by: Binnie Rail, MD Reason for referral : No chief complaint on file.   An unsuccessful telephone outreach was attempted today. The patient was referred to the pharmacist for assistance with care management and care coordination.   Follow Up Plan:   Carley Perdue [employer] Scheduler

## 2021-02-15 ENCOUNTER — Ambulatory Visit: Payer: PPO | Admitting: Family Medicine

## 2021-02-24 ENCOUNTER — Other Ambulatory Visit: Payer: Self-pay

## 2021-02-24 MED ORDER — ROSUVASTATIN CALCIUM 20 MG PO TABS: 20.0000 mg | ORAL_TABLET | Freq: Every day | ORAL | 1 refills | Status: DC

## 2021-02-27 ENCOUNTER — Other Ambulatory Visit: Payer: Self-pay | Admitting: Internal Medicine

## 2021-03-01 ENCOUNTER — Other Ambulatory Visit: Payer: Self-pay

## 2021-03-01 ENCOUNTER — Ambulatory Visit (INDEPENDENT_AMBULATORY_CARE_PROVIDER_SITE_OTHER): Payer: PPO

## 2021-03-01 VITALS — BP 128/80 | HR 59 | Temp 98.3°F | Ht 72.0 in | Wt 203.6 lb

## 2021-03-01 DIAGNOSIS — Z0000 Encounter for general adult medical examination without abnormal findings: Secondary | ICD-10-CM

## 2021-03-01 NOTE — Progress Notes (Signed)
Subjective:   Alex Bryant is a 70 y.o. male who presents for Medicare Annual/Subsequent preventive examination.  Review of Systems    No ROS. Medicare Wellness Visit. Additional risk factors are reflected in social history. Cardiac Risk Factors include: advanced age (>60men, >54 women);diabetes mellitus;dyslipidemia;family history of premature cardiovascular disease;hypertension;male gender Sleep Patterns: No sleep issues, feels rested on waking and sleeps 8 hours nightly. Home Safety/Smoke Alarms: Feels safe in home; uses home alarm. Smoke alarms in place. Living environment: 1-story home; Lives with spouse; no needs for DME; good support system. Seat Belt Safety/Bike Helmet: Wears seat belt.    Objective:    Today's Vitals   03/01/21 0830  BP: 128/80  Pulse: (!) 59  Temp: 98.3 F (36.8 C)  SpO2: 96%  Weight: 203 lb 9.6 oz (92.4 kg)  Height: 6' (1.829 m)  PainSc: 0-No pain   Body mass index is 27.61 kg/m.  Advanced Directives 03/01/2021 08/14/2018 08/14/2018 11/20/2016 09/09/2014  Does Patient Have a Medical Advance Directive? Yes Yes Yes No Yes  Type of Advance Directive Living will;Healthcare Power of [place];Living will [place];Living will - [place];Living will  Does patient want to make changes to medical advance directive? No - Patient declined No - Patient declined No - Patient declined - -  Copy of [place] in Chart? No - copy requested No - copy requested No - copy requested - -  Would patient like information on creating a medical advance directive? - - - No - Patient declined -    Current Medications (verified) Outpatient Encounter Medications as of 03/01/2021  Medication Sig  . clopidogrel (PLAVIX) 75 MG tablet Take 1 tablet (75 mg total) by mouth daily.  . fluticasone (FLONASE) 50 MCG/ACT nasal spray Place 2 sprays into both nostrils daily.  Marland Kitchen glucose blood test strip 1  each by Other route as needed. Use as instructed   . hydrochlorothiazide (HYDRODIURIL) 25 MG tablet Take 1 tablet (25 mg total) by mouth daily.  . Lancets (FREESTYLE) lancets 1 each by Other route 2 (two) times daily. Use to help check blood sugar twice a day. Dx E11.9  . metFORMIN (GLUCOPHAGE) 1000 MG tablet TAKE 1 TABLET(1000 MG) BY MOUTH TWICE DAILY WITH A MEAL  . metoprolol tartrate (LOPRESSOR) 25 MG tablet TAKE 1 TABLET BY MOUTH TWICE DAILY. OVERDUE FOR ANNUAL APPOINTMENT  . Omega-3 Fatty Acids (FISH OIL) 1000 MG CAPS Take 1 capsule (1,000 mg total) by mouth daily.  . ramipril (ALTACE) 5 MG capsule TAKE 2 CAPSULES(10 MG) BY MOUTH DAILY  . rosuvastatin (CRESTOR) 20 MG tablet Take 1 tablet (20 mg total) by mouth daily.  . RYBELSUS 3 MG TABS TAKE 1 TABLET BY MOUTH DAILY BEFORE BREAKFAST  . topiramate (TOPAMAX) 50 MG tablet Take 1 tablet (50 mg total) by mouth 2 (two) times daily.  Marland Kitchen zolpidem (AMBIEN) 5 MG tablet TAKE 1 TABLET BY MOUTH AT BEDTIME AS NEEDED.   No facility-administered encounter medications on file as of 03/01/2021.    Allergies (verified) Oxycodone   History: Past Medical History:  Diagnosis Date  . Allergy   . Coronary artery disease   . Diet-controlled diabetes mellitus ([place])    AODM  . History of gout   . Hyperlipidemia   . Hypertension   . Progressive angina ([place]) 08/12/2018   Chest pain  . Stroke ([place]) 11/19/2016   "fully recovered" (08/14/2018)  . 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 BACK SURGERY    . COLONOSCOPY    . COLONOSCOPY W/ POLYPECTOMY  2010; 2015   Dr Olevia Perches  . CORONARY ARTERY BYPASS GRAFT N/A 08/19/2018   Procedure: CORONARY ARTERY BYPASS GRAFTING (CABG) x 5  USING LEFT INTERNAL MAMMARY ARTERY AND ENDOSCOPICALLY HARVESTED RIGHT SAPHENOUS VEIN.;  Surgeon: Ivin Poot, MD;  Location: [facility];  Service: Open Heart Surgery;  Laterality: N/A;  . EP IMPLANTABLE DEVICE N/A 11/21/2016    Procedure: Loop Recorder Insertion;  Surgeon: Thompson Grayer, MD;  Location: [facility];  Service: Cardiovascular;  Laterality: N/A;  . ESI  2014   @L5 -S1 X3   . EXCISIONAL HEMORRHOIDECTOMY  ~ 1995  . FACIAL RECONSTRUCTION SURGERY Right 1990s   plating & pinning of maxilla  . INGUINAL HERNIA REPAIR Bilateral 1959  . LEFT HEART CATH AND CORONARY ANGIOGRAPHY N/A 08/14/2018   Procedure: LEFT HEART CATH AND CORONARY ANGIOGRAPHY;  Surgeon: Lorretta Harp, MD;  Location: [facility];  Service: Cardiovascular;  Laterality: N/A;  . POLYPECTOMY    . POSTERIOR LUMBAR FUSION  10/17/2013   L5-S1 ; [place]  . TEE WITHOUT CARDIOVERSION N/A 11/21/2016   Procedure: TRANSESOPHAGEAL ECHOCARDIOGRAM (TEE);  Surgeon: Larey Dresser, MD;  Location: [facility];  Service: Cardiovascular;  Laterality: N/A;  . TEE WITHOUT CARDIOVERSION N/A 08/19/2018   Procedure: TRANSESOPHAGEAL ECHOCARDIOGRAM (TEE);  Surgeon: Prescott Gum, Collier Salina, MD;  Location: [facility];  Service: Open Heart Surgery;  Laterality: N/A;   Family History  Problem Relation Age of Onset  . Heart attack Father 57  . Hypertension Mother   . Stroke Mother        X58  . Diabetes Sister        "pre Diabetes"  . Stroke Maternal Uncle   . Cancer Neg Hx   . Colon cancer Neg Hx   . Rectal cancer Neg Hx   . Stomach cancer Neg Hx   . Colon polyps Neg Hx   . Esophageal cancer Neg Hx   . Headache Neg Hx   . Migraines Neg Hx    Social History   Socioeconomic History  . Marital status: Married    Spouse name: Not on file  . Number of children: 3  . Years of education: Not on file  . Highest education level: Master's degree (e.g., MA, MS, MEng, MEd, MSW, MBA)  Occupational History  . Not on file  Tobacco Use  . Smoking status: Former Smoker    Packs/day: 0.25    Years: 6.00    Pack years: 1.50    Types: Cigarettes    Quit date: 12/17/1980    Years since quitting: 40.2  . Smokeless tobacco: Never Used  . Tobacco comment:  smoked off and on 1966- 1982, up to < 5 cigarettes / day  Vaping Use  . Vaping Use: Never used  Substance and Sexual Activity  . Alcohol use: Yes    Alcohol/week: 4.0 standard drinks    Types: 4 Glasses of wine per week    Comment:  socially  . Drug use: Never  . Sexual activity: Yes  Other Topics Concern  . Not on file  Social History Narrative   Lives at home with wife    Right handed   Caffeine: decaf coffee   Social Determinants of Health   Financial Resource Strain: Low Risk   . Difficulty of Paying  Living Expenses: Not hard at all  Food Insecurity: No Food Insecurity  . Worried About Charity fundraiser in the Last Year: Never true  . Ran Out of Food in the Last Year: Never true  Transportation Needs: No Transportation Needs  . Lack of Transportation (Medical): No  . Lack of Transportation (Non-Medical): No  Physical Activity: Sufficiently Active  . Days of Exercise per Week: 5 days  . Minutes of Exercise per Session: 30 min  Stress: No Stress Concern Present  . Feeling of Stress : Not at all  Social Connections: Socially Integrated  . Frequency of Communication with Friends and Family: More than three times a week  . Frequency of Social Gatherings with Friends and Family: More than three times a week  . Attends Religious Services: More than 4 times per year  . Active Member of Clubs or Organizations: Yes  . Attends Archivist Meetings: More than 4 times per year  . Marital Status: Married    Tobacco Counseling Counseling given: Not Answered Comment: smoked off and on 1966- 1982, up to < 5 cigarettes / day   Clinical Intake:  Pre-visit preparation completed: Yes  Pain : No/denies pain Pain Score: 0-No pain     BMI - recorded: 27.61 Nutritional Status: BMI 25 -29 Overweight Nutritional Risks: None Diabetes: Yes CBG done?: No Did pt. bring in CBG monitor from home?: No  How often do you need to have someone help you when you read  instructions, pamphlets, or other written materials from your doctor or pharmacy?: 1 - Never What is the last grade level you completed in school?: Master Degree  Diabetic? yes  Interpreter Needed?: No  Information entered by :: Lisette Abu, LPN   Activities of Daily Living In your present state of health, do you have any difficulty performing the following activities: 03/01/2021 10/11/2020  Hearing? N N  Vision? N N  Difficulty concentrating or making decisions? N N  Walking or climbing stairs? N N  Dressing or bathing? N N  Doing errands, shopping? N N  Preparing Food and eating ? N -  Using the Toilet? N -  In the past six months, have you accidently leaked urine? N -  Do you have problems with loss of bowel control? N -  Managing your Medications? N -  Managing your Finances? N -  Housekeeping or managing your Housekeeping? N -  Some recent data might be hidden    Patient Care Team: Binnie Rail, MD as PCP - General (Internal Medicine) Lorretta Harp, MD as PCP - Cardiology (Cardiology) Rowe Pavy, MD as Referring Physician (Orthopedic Surgery)  Indicate any recent Medical Services you may have received from other than [facility] providers in the past year (date may be approximate).     Assessment:   This is a routine wellness examination for Maceo.  Hearing/Vision screen No exam data present  Dietary issues and exercise activities discussed: Current Exercise Habits: Home exercise routine, Type of exercise: walking, Time (Minutes): 30, Frequency (Times/Week): 5, Weekly Exercise (Minutes/Week): 150, Intensity: Moderate, Exercise limited by: cardiac condition(s)  Goals    . Patient Stated     To maintain my current health status by continuing to eat healthy, stay physically active and socially active.      Depression Screen PHQ 2/9 Scores 03/01/2021 11/10/2019 05/19/2019 11/21/2017 02/29/2016  PHQ - 2 Score 0 0 0 0 0    Fall Risk Fall Risk  03/01/2021 03/10/2020  11/10/2019  05/19/2019 11/21/2017  Falls in the past year? 0 0 0 0 No  Number falls in past yr: 0 0 0 0 -  Injury with Fall? 0 0 - - -  Risk for fall due to : No Fall Risks - - - -  Follow up Falls evaluation completed - - - -    FALL RISK PREVENTION PERTAINING TO THE HOME:  Any stairs in or around the home? No  If so, are there any without handrails? No  Home free of loose throw rugs in walkways, pet beds, electrical cords, etc? Yes  Adequate lighting in your home to reduce risk of falls? Yes   ASSISTIVE DEVICES UTILIZED TO PREVENT FALLS:  Life alert? No  Use of a cane, walker or w/c? No  Grab bars in the bathroom? No  Shower chair or bench in shower? No  Elevated toilet seat or a handicapped toilet? Yes   TIMED UP AND GO:  Was the test performed? No .  Length of time to ambulate 10 feet: 0 sec.   Gait steady and fast without use of assistive device  Cognitive Function: Normal cognitive status assessed by direct observation by this Nurse Health Advisor. No abnormalities found.          Immunizations Immunization History  Administered Date(s) Administered  . Fluad Quad(high Dose 65+) 09/12/2019, 10/11/2020  . Influenza Split 10/13/2014  . Influenza Whole 09/16/2000  . Influenza, High Dose Seasonal PF 12/03/2016, 11/21/2017  . PFIZER(Purple Top)SARS-COV-2 Vaccination 01/06/2020, 01/27/2020, 09/19/2020  . Pneumococcal Conjugate-13 04/16/2017  . Pneumococcal Polysaccharide-23 05/22/2018  . Zoster 10/13/2014  . Zoster Recombinat (Shingrix) 09/12/2019, 11/14/2019    TDAP status: Due, Education has been provided regarding the importance of this vaccine. Advised may receive this vaccine at local pharmacy or Health Dept. Aware to provide a copy of the vaccination record if obtained from local pharmacy or Health Dept. Verbalized acceptance and understanding.  Flu Vaccine status: Up to date  Pneumococcal vaccine status: Up to date  Covid-19 vaccine status: Completed  vaccines  Qualifies for Shingles Vaccine? Yes   Zostavax completed Yes   Shingrix Completed?: Yes  Screening Tests Health Maintenance  Topic Date Due  . FOOT EXAM  05/18/2020  . OPHTHALMOLOGY EXAM  01/11/2021  . TETANUS/TDAP  10/11/2021 (Originally 11/20/1970)  . HEMOGLOBIN A1C  04/11/2021  . COLONOSCOPY (Pts 45-68yrs Insurance coverage will need to be confirmed)  01/18/2023  . INFLUENZA VACCINE  Completed  . COVID-19 Vaccine  Completed  . Hepatitis C Screening  Completed  . PNA vac Low Risk Adult  Completed  . HPV VACCINES  Aged Out    Health Maintenance  Health Maintenance Due  Topic Date Due  . FOOT EXAM  05/18/2020  . OPHTHALMOLOGY EXAM  01/11/2021    Colorectal cancer screening: Type of screening: Colonoscopy. Completed 01/19/2020. Repeat every 3 years  Lung Cancer Screening: (Low Dose CT Chest recommended if Age 17-80 years, 30 pack-year currently smoking OR have quit w/in 15years.) does qualify.   Lung Cancer Screening Referral: no  Additional Screening:  Hepatitis C Screening: does qualify; Completed yes  Vision Screening: Recommended annual ophthalmology exams for early detection of glaucoma and other disorders of the eye. Is the patient up to date with their annual eye exam?  Yes  Who is the provider or what is the name of the office in which the patient attends annual eye exams? [facility] If pt is not established with a provider, would they like to be  referred to a provider to establish care? No .   Dental Screening: Recommended annual dental exams for proper oral hygiene  Community Resource Referral / Chronic Care Management: CRR required this visit?  No   CCM required this visit?  No      Plan:     I have personally reviewed and noted the following in the patient's chart:   . Medical and social history . Use of alcohol, tobacco or illicit drugs  . Current medications and supplements . Functional ability and status . Nutritional  status . Physical activity . Advanced directives . List of other physicians . Hospitalizations, surgeries, and ER visits in previous 12 months . Vitals . Screenings to include cognitive, depression, and falls . Referrals and appointments  In addition, I have reviewed and discussed with patient certain preventive protocols, quality metrics, and best practice recommendations. A written personalized care plan for preventive services as well as general preventive health recommendations were provided to patient.     Sheral Flow, LPN   [id number]   Nurse Notes:  Medications reviewed with patient; no opioid use noted.

## 2021-03-01 NOTE — Patient Instructions (Signed)
Alex Bryant , Thank you for taking time to come for your Medicare Wellness Visit. I appreciate your ongoing commitment to your health goals. Please review the following plan we discussed and let me know if I can assist you in the future.   Screening recommendations/referrals: Colonoscopy: 01/19/2020; due every 3 years Recommended yearly ophthalmology/optometry visit for glaucoma screening and checkup Recommended yearly dental visit for hygiene and checkup  Vaccinations: Influenza vaccine: 10/11/2020 Pneumococcal vaccine: 04/16/2017, 05/22/2018 Tdap vaccine: never done; check with local pharmacy Shingles vaccine: 09/12/2019, 11/14/2019   Covid-19: 01/06/2020, 01/27/2020, 09/19/2020  Advanced directives: Please bring a copy of your health care power of attorney and living will to the office at your convenience.  Conditions/risks identified: Yes; Reviewed health maintenance screenings with patient today and relevant education, vaccines, and/or referrals were provided. Please continue to do your personal lifestyle choices by: daily care of teeth and gums, regular physical activity (goal should be 5 days a week for 30 minutes), eat a healthy diet, avoid tobacco and drug use, limiting any alcohol intake, taking a low-dose aspirin (if not allergic or have been advised by your provider otherwise) and taking vitamins and minerals as recommended by your provider. Continue doing brain stimulating activities (puzzles, reading, adult coloring books, staying active) to keep memory sharp. Continue to eat heart healthy diet (full of fruits, vegetables, whole grains, lean protein, water--limit salt, fat, and sugar intake) and increase physical activity as tolerated.  Next appointment: Please schedule your next Medicare Wellness Visit with your Nurse Health Advisor in 1 year by calling [phone].   Preventive Care 72 Years and Older, Male Preventive care refers to lifestyle choices and visits with your health care  provider that can promote health and wellness. What does preventive care include?  A yearly physical exam. This is also called an annual well check.  Dental exams once or twice a year.  Routine eye exams. Ask your health care provider how often you should have your eyes checked.  Personal lifestyle choices, including:  Daily care of your teeth and gums.  Regular physical activity.  Eating a healthy diet.  Avoiding tobacco and drug use.  Limiting alcohol use.  Practicing safe sex.  Taking low doses of aspirin every day.  Taking vitamin and mineral supplements as recommended by your health care provider. What happens during an annual well check? The services and screenings done by your health care provider during your annual well check will depend on your age, overall health, lifestyle risk factors, and family history of disease. Counseling  Your health care provider may ask you questions about your:  Alcohol use.  Tobacco use.  Drug use.  Emotional well-being.  Home and relationship well-being.  Sexual activity.  Eating habits.  History of falls.  Memory and ability to understand (cognition).  Work and work Statistician. Screening  You may have the following tests or measurements:  Height, weight, and BMI.  Blood pressure.  Lipid and cholesterol levels. These may be checked every 5 years, or more frequently if you are over 72 years old.  Skin check.  Lung cancer screening. You may have this screening every year starting at age 72 if you have a 30-pack-year history of smoking and currently smoke or have quit within the past 15 years.  Fecal occult blood test (FOBT) of the stool. You may have this test every year starting at age 72.  Flexible sigmoidoscopy or colonoscopy. You may have a sigmoidoscopy every 5 years or a colonoscopy every 10  years starting at age 72.  Prostate cancer screening. Recommendations will vary depending on your family history and  other risks.  Hepatitis C blood test.  Hepatitis B blood test.  Sexually transmitted disease (STD) testing.  Diabetes screening. This is done by checking your blood sugar (glucose) after you have not eaten for a while (fasting). You may have this done every 1-3 years.  Abdominal aortic aneurysm (AAA) screening. You may need this if you are a current or former smoker.  Osteoporosis. You may be screened starting at age 72 if you are at high risk. Talk with your health care provider about your test results, treatment options, and if necessary, the need for more tests. Vaccines  Your health care provider may recommend certain vaccines, such as:  Influenza vaccine. This is recommended every year.  Tetanus, diphtheria, and acellular pertussis (Tdap, Td) vaccine. You may need a Td booster every 10 years.  Zoster vaccine. You may need this after age 72.  Pneumococcal 13-valent conjugate (PCV13) vaccine. One dose is recommended after age 72.  Pneumococcal polysaccharide (PPSV23) vaccine. One dose is recommended after age 72. Talk to your health care provider about which screenings and vaccines you need and how often you need them. This information is not intended to replace advice given to you by your health care provider. Make sure you discuss any questions you have with your health care provider. Document Released: 12/30/2015 Document Revised: 08/22/2016 Document Reviewed: 10/04/2015 Elsevier Interactive Patient Education  2017 [place] Prevention in the Home Falls can cause injuries. They can happen to people of all ages. There are many things you can do to make your home safe and to help prevent falls. What can I do on the outside of my home?  Regularly fix the edges of walkways and driveways and fix any cracks.  Remove anything that might make you trip as you walk through a door, such as a raised step or threshold.  Trim any bushes or trees on the path to your  home.  Use bright outdoor lighting.  Clear any walking paths of anything that might make someone trip, such as rocks or tools.  Regularly check to see if handrails are loose or broken. Make sure that both sides of any steps have handrails.  Any raised decks and porches should have guardrails on the edges.  Have any leaves, snow, or ice cleared regularly.  Use sand or salt on walking paths during winter.  Clean up any spills in your garage right away. This includes oil or grease spills. What can I do in the bathroom?  Use night lights.  Install grab bars by the toilet and in the tub and shower. Do not use towel bars as grab bars.  Use non-skid mats or decals in the tub or shower.  If you need to sit down in the shower, use a plastic, non-slip stool.  Keep the floor dry. Clean up any water that spills on the floor as soon as it happens.  Remove soap buildup in the tub or shower regularly.  Attach bath mats securely with double-sided non-slip rug tape.  Do not have throw rugs and other things on the floor that can make you trip. What can I do in the bedroom?  Use night lights.  Make sure that you have a light by your bed that is easy to reach.  Do not use any sheets or blankets that are too big for your bed. They should not hang  down onto the floor.  Have a firm chair that has side arms. You can use this for support while you get dressed.  Do not have throw rugs and other things on the floor that can make you trip. What can I do in the kitchen?  Clean up any spills right away.  Avoid walking on wet floors.  Keep items that you use a lot in easy-to-reach places.  If you need to reach something above you, use a strong step stool that has a grab bar.  Keep electrical cords out of the way.  Do not use floor polish or wax that makes floors slippery. If you must use wax, use non-skid floor wax.  Do not have throw rugs and other things on the floor that can make you  trip. What can I do with my stairs?  Do not leave any items on the stairs.  Make sure that there are handrails on both sides of the stairs and use them. Fix handrails that are broken or loose. Make sure that handrails are as long as the stairways.  Check any carpeting to make sure that it is firmly attached to the stairs. Fix any carpet that is loose or worn.  Avoid having throw rugs at the top or bottom of the stairs. If you do have throw rugs, attach them to the floor with carpet tape.  Make sure that you have a light switch at the top of the stairs and the bottom of the stairs. If you do not have them, ask someone to add them for you. What else can I do to help prevent falls?  Wear shoes that:  Do not have high heels.  Have rubber bottoms.  Are comfortable and fit you well.  Are closed at the toe. Do not wear sandals.  If you use a stepladder:  Make sure that it is fully opened. Do not climb a closed stepladder.  Make sure that both sides of the stepladder are locked into place.  Ask someone to hold it for you, if possible.  Clearly mark and make sure that you can see:  Any grab bars or handrails.  First and last steps.  Where the edge of each step is.  Use tools that help you move around (mobility aids) if they are needed. These include:  Canes.  Walkers.  Scooters.  Crutches.  Turn on the lights when you go into a dark area. Replace any light bulbs as soon as they burn out.  Set up your furniture so you have a clear path. Avoid moving your furniture around.  If any of your floors are uneven, fix them.  If there are any pets around you, be aware of where they are.  Review your medicines with your doctor. Some medicines can make you feel dizzy. This can increase your chance of falling. Ask your doctor what other things that you can do to help prevent falls. This information is not intended to replace advice given to you by your health care provider. Make  sure you discuss any questions you have with your health care provider. Document Released: 09/29/2009 Document Revised: 05/10/2016 Document Reviewed: 01/07/2015 Elsevier Interactive Patient Education  2017 [employer].

## 2021-03-03 ENCOUNTER — Telehealth: Payer: Self-pay | Admitting: Internal Medicine

## 2021-03-03 NOTE — Progress Notes (Signed)
  Chronic Care Management   Note  03/03/2021 Name: Alex Bryant MRN: [id number] DOB: [birth year]  Alex Bryant is a 70 y.o. year old male who is a primary care patient of Burns, Claudina Lick, MD. I reached out to Alex Bryant by phone today in response to a referral sent by Mr. Leane Para Klaas's PCP, Binnie Rail, MD.   Mr. Fuchs was given information about Chronic Care Management services today including:  1. CCM service includes personalized support from designated clinical staff supervised by his physician, including individualized plan of care and coordination with other care providers 2. 24/7 contact phone numbers for assistance for urgent and routine care needs. 3. Service will only be billed when office clinical staff spend 20 minutes or more in a month to coordinate care. 4. Only one practitioner may furnish and bill the service in a calendar month. 5. The patient may stop CCM services at any time (effective at the end of the month) by phone call to the office staff.   Patient did not agree to enrollment in care management services and does not wish to consider at this time.  Follow up plan:   Carley Perdue [employer] Scheduler

## 2021-03-08 ENCOUNTER — Other Ambulatory Visit: Payer: Self-pay | Admitting: Internal Medicine

## 2021-03-09 DIAGNOSIS — H5789 Other specified disorders of eye and adnexa: Secondary | ICD-10-CM | POA: Diagnosis not present

## 2021-03-09 DIAGNOSIS — H5713 Ocular pain, bilateral: Secondary | ICD-10-CM | POA: Diagnosis not present

## 2021-03-13 ENCOUNTER — Other Ambulatory Visit: Payer: Self-pay | Admitting: Internal Medicine

## 2021-03-27 DIAGNOSIS — U071 COVID-19: Secondary | ICD-10-CM | POA: Diagnosis not present

## 2021-03-27 DIAGNOSIS — J069 Acute upper respiratory infection, unspecified: Secondary | ICD-10-CM | POA: Diagnosis not present

## 2021-03-29 ENCOUNTER — Other Ambulatory Visit: Payer: Self-pay | Admitting: Internal Medicine

## 2021-04-05 ENCOUNTER — Other Ambulatory Visit: Payer: Self-pay | Admitting: Cardiovascular Disease

## 2021-04-15 ENCOUNTER — Other Ambulatory Visit: Payer: Self-pay | Admitting: Internal Medicine

## 2021-04-19 ENCOUNTER — Emergency Department (HOSPITAL_COMMUNITY)
Admission: EM | Admit: 2021-04-19 | Discharge: 2021-04-20 | Disposition: A | Discharge status: Home / Self Care | Payer: PPO | Attending: Emergency Medicine | Admitting: Emergency Medicine

## 2021-04-19 DIAGNOSIS — Z951 Presence of aortocoronary bypass graft: Secondary | ICD-10-CM | POA: Insufficient documentation

## 2021-04-19 DIAGNOSIS — I2510 Atherosclerotic heart disease of native coronary artery without angina pectoris: Secondary | ICD-10-CM | POA: Insufficient documentation

## 2021-04-19 DIAGNOSIS — I10 Essential (primary) hypertension: Secondary | ICD-10-CM | POA: Insufficient documentation

## 2021-04-19 DIAGNOSIS — T50905A Adverse effect of unspecified drugs, medicaments and biological substances, initial encounter: Secondary | ICD-10-CM

## 2021-04-19 DIAGNOSIS — E1165 Type 2 diabetes mellitus with hyperglycemia: Secondary | ICD-10-CM | POA: Diagnosis not present

## 2021-04-19 DIAGNOSIS — Z87891 Personal history of nicotine dependence: Secondary | ICD-10-CM | POA: Diagnosis not present

## 2021-04-19 DIAGNOSIS — E1140 Type 2 diabetes mellitus with diabetic neuropathy, unspecified: Secondary | ICD-10-CM | POA: Insufficient documentation

## 2021-04-19 DIAGNOSIS — Z7984 Long term (current) use of oral hypoglycemic drugs: Secondary | ICD-10-CM | POA: Insufficient documentation

## 2021-04-19 DIAGNOSIS — G4710 Hypersomnia, unspecified: Secondary | ICD-10-CM

## 2021-04-19 DIAGNOSIS — G9340 Encephalopathy, unspecified: Secondary | ICD-10-CM | POA: Diagnosis not present

## 2021-04-19 DIAGNOSIS — Z79899 Other long term (current) drug therapy: Secondary | ICD-10-CM | POA: Diagnosis not present

## 2021-04-19 DIAGNOSIS — R739 Hyperglycemia, unspecified: Secondary | ICD-10-CM

## 2021-04-19 DIAGNOSIS — R4182 Altered mental status, unspecified: Secondary | ICD-10-CM | POA: Insufficient documentation

## 2021-04-19 DIAGNOSIS — R7989 Other specified abnormal findings of blood chemistry: Secondary | ICD-10-CM | POA: Insufficient documentation

## 2021-04-19 DIAGNOSIS — N179 Acute kidney failure, unspecified: Secondary | ICD-10-CM

## 2021-04-19 DIAGNOSIS — R001 Bradycardia, unspecified: Secondary | ICD-10-CM | POA: Diagnosis not present

## 2021-04-19 DIAGNOSIS — H35039 Hypertensive retinopathy, unspecified eye: Secondary | ICD-10-CM | POA: Diagnosis not present

## 2021-04-19 DIAGNOSIS — U099 Post covid-19 condition, unspecified: Secondary | ICD-10-CM | POA: Diagnosis not present

## 2021-04-19 DIAGNOSIS — E860 Dehydration: Secondary | ICD-10-CM

## 2021-04-19 LAB — TROPONIN I (HIGH SENSITIVITY): Troponin I (High Sensitivity): 4 ng/L (ref <18)

## 2021-04-19 LAB — CBC WITH DIFFERENTIAL/PLATELET
Abs Immature Granulocytes: 0.03 10*3/uL (ref 0.00–0.07)
Basophils Absolute: 0.1 10*3/uL (ref 0.0–0.1)
Basophils Relative: 1 %
Eosinophils Absolute: 0.2 10*3/uL (ref 0.0–0.5)
Eosinophils Relative: 2 %
HCT: 34.6 % — ABNORMAL LOW (ref 39.0–52.0)
Hemoglobin: 11.6 g/dL — ABNORMAL LOW (ref 13.0–17.0)
Immature Granulocytes: 0 %
Lymphocytes Relative: 12 %
Lymphs Abs: 1.1 10*3/uL (ref 0.7–4.0)
MCH: 31 pg (ref 26.0–34.0)
MCHC: 33.5 g/dL (ref 30.0–36.0)
MCV: 92.5 fL (ref 80.0–100.0)
Monocytes Absolute: 0.4 10*3/uL (ref 0.1–1.0)
Monocytes Relative: 4 %
Neutro Abs: 7.7 10*3/uL (ref 1.7–7.7)
Neutrophils Relative %: 81 %
Platelets: 227 10*3/uL (ref 150–400)
RBC: 3.74 MIL/uL — ABNORMAL LOW (ref 4.22–5.81)
RDW: 13 % (ref 11.5–15.5)
WBC: 9.5 10*3/uL (ref 4.0–10.5)
nRBC: 0 % (ref 0.0–0.2)

## 2021-04-19 LAB — COMPREHENSIVE METABOLIC PANEL
ALT: 20 U/L (ref 0–44)
AST: 18 U/L (ref 15–41)
Albumin: 3.6 g/dL (ref 3.5–5.0)
Alkaline Phosphatase: 54 U/L (ref 38–126)
Anion gap: 8 (ref 5–15)
BUN: 28 mg/dL — ABNORMAL HIGH (ref 8–23)
CO2: 19 mmol/L — ABNORMAL LOW (ref 22–32)
Calcium: 9.1 mg/dL (ref 8.9–10.3)
Chloride: 109 mmol/L (ref 98–111)
Creatinine, Ser: 1.58 mg/dL — ABNORMAL HIGH (ref 0.61–1.24)
GFR, Estimated: 46 mL/min — ABNORMAL LOW (ref >60)
Glucose, Bld: 304 mg/dL — ABNORMAL HIGH (ref 70–99)
Potassium: 4.8 mmol/L (ref 3.5–5.1)
Sodium: 136 mmol/L (ref 135–145)
Total Bilirubin: 0.8 mg/dL (ref 0.3–1.2)
Total Protein: 6.2 g/dL — ABNORMAL LOW (ref 6.5–8.1)

## 2021-04-19 LAB — I-STAT ARTERIAL BLOOD GAS, ED
Acid-base deficit: 8 mmol/L — ABNORMAL HIGH (ref 0.0–2.0)
Bicarbonate: 17.6 mmol/L — ABNORMAL LOW (ref 20.0–28.0)
Calcium, Ion: 1.26 mmol/L (ref 1.15–1.40)
HCT: 28 % — ABNORMAL LOW (ref 39.0–52.0)
Hemoglobin: 9.5 g/dL — ABNORMAL LOW (ref 13.0–17.0)
O2 Saturation: 97 %
Potassium: 4.6 mmol/L (ref 3.5–5.1)
Sodium: 139 mmol/L (ref 135–145)
TCO2: 19 mmol/L — ABNORMAL LOW (ref 22–32)
pCO2 arterial: 33.2 mmHg (ref 32.0–48.0)
pH, Arterial: 7.332 — ABNORMAL LOW (ref 7.350–7.450)
pO2, Arterial: 102 mmHg (ref 83.0–108.0)

## 2021-04-19 LAB — CBG MONITORING, ED: Glucose-Capillary: 256 mg/dL — ABNORMAL HIGH (ref 70–99)

## 2021-04-19 LAB — LACTIC ACID, PLASMA: Lactic Acid, Venous: 1.5 mmol/L (ref 0.5–1.9)

## 2021-04-19 LAB — ETHANOL: Alcohol, Ethyl (B): <10 mg/dL (ref <10)

## 2021-04-19 MED ORDER — NALOXONE HCL 2 MG/2ML IJ SOSY: 2.0000 mg | PREFILLED_SYRINGE | Freq: Once | INTRAMUSCULAR | Status: AC

## 2021-04-19 MED ORDER — SODIUM CHLORIDE 0.9 % IV BOLUS
1000.0000 mL | Freq: Once | INTRAVENOUS | Status: AC
  Administered 2021-04-19: 1000 mL via INTRAVENOUS

## 2021-04-19 MED ORDER — NALOXONE HCL 0.4 MG/ML IJ SOLN: 0.4000 mg | Freq: Once | INTRAMUSCULAR | Status: DC

## 2021-04-19 MED ORDER — NALOXONE HCL 2 MG/2ML IJ SOSY
PREFILLED_SYRINGE | INTRAMUSCULAR | Status: AC
  Administered 2021-04-19: 2 mg via INTRAVENOUS
  Filled 2021-04-19: qty 2

## 2021-04-19 NOTE — ED Provider Notes (Signed)
Emergency Medicine Provider Triage Evaluation Note  Alex Bryant , a 70 y.o. male  was evaluated in triage.  Pt is somnolent and hypotensive. Took a muscle relaxer and two prednisone. Now has AMS. BP/63/48 in triage. Charge nurse is aware. Moving to room 26   Medical Decision Making  Medically screening exam initiated at 9:19 PM.  Appropriate orders placed.  Alex Bryant was informed that the remainder of the evaluation will be completed by another provider, this initial triage assessment does not replace that evaluation, and the importance of remaining in the ED until their evaluation is complete.     Sherrill Raring, PA-C 04/19/21 2123    Lajean Saver, MD 04/24/21 [phone]

## 2021-04-19 NOTE — ED Triage Notes (Signed)
Took 2 prednisone and muscle relaxer and becoming lethargic. BP 63/48.

## 2021-04-19 NOTE — ED Provider Notes (Signed)
[facility] EMERGENCY DEPARTMENT Provider Note   CSN: [id number] Arrival date & time: 04/19/21  2112     History Chief Complaint  Patient presents with  . Altered Mental Status    Alex Bryant is a 70 y.o. male.  Patient presents with altered mental status, excessive drowsiness, and low bp. Patient was at a local social event, when noted that he couldn't stay awake/alert. Symptoms acute onset, mod-severe, constant, persistent. Spouse reports patient had taken topamax and fluvoxamine just prior to the event (fluvoxamine for the first time) - she indicates fluvoxamine is the patients only new medication. Denies taking med in over the prescribed dose/amount. Had felt at baseline just prior to going to the event, with no new or acute symptoms. No fever/chills. No new/acute headache. No chest pain. No abd pain or nvd. No gu c/o. No other alcohol/substance use. Spouse notes one similar event ~ one month ago - at that time patient had taken a muscle relaxer along with a few alcohol beverages, and was similarly excessively drowsy - she states after pushing po fluids, and sleeping all night, his symptoms resolved uneventfully then.  Spouse does note patient had eaten/drank relatively little throughout course of day today - states essentially nothing to eat/drink since noon today. No report of syncope. No trauma/fall. No nsaid use, takes goodys prn (denies excessive use).   The history is provided by the patient and the spouse.  Altered Mental Status Associated symptoms: no abdominal pain, no fever, no rash, no vomiting and no weakness        Past Medical History:  Diagnosis Date  . Allergy   . Coronary artery disease   . Diet-controlled diabetes mellitus ([place])    AODM  . History of gout   . Hyperlipidemia   . Hypertension   . Progressive angina ([place]) 08/12/2018   Chest pain  . Stroke ([place]) 11/19/2016   "fully recovered" (08/14/2018)  . Tick bite [phone]   Erlichiosis;Eulis Canner MD, ID    Patient Active Problem List   Diagnosis Date Noted  . Acute sinus infection 11/01/2020  . Chronic ethmoidal sinusitis 03/10/2020  . Hypertensive retinopathy 01/13/2020  . Strain of thoracic paraspinal muscles excluding T1 and T2 levels 07/13/2019  . Paroxysmal atrial fibrillation ([place]) 09/08/2018  . 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 CAD (coronary artery disease) 08/14/2018  . Progressive angina ([place]) 08/12/2018  . Vitamin B 12 deficiency 11/21/2017  . Diabetic neuropathy ([place]) 01/22/2017  . Cerebral thrombosis with cerebral infarction 11/20/2016  . History of CVA (cerebrovascular accident) without residual deficits 11/20/2016  . 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 Plantar fasciitis of right foot 02/17/2014  . Acquired spondylolisthesis 09/29/2013  . 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 Lumbosacral spondylosis without myelopathy 05/25/2013  . Essential hypertension 01/24/2011  . MEDIAL MENISCUS TEAR, RIGHT 07/06/2010  . Diabetes mellitus ([place]) 01/06/2009  . URTICARIA 01/06/2009  . Tubular adenoma of colon 08/26/2008  . Mixed hyperlipidemia 02/24/2008  . History of gout 11/14/2007  . Sleep disorder 09/08/2007  . 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 BACK SURGERY    . COLONOSCOPY    . COLONOSCOPY W/ POLYPECTOMY  2010; 2015   Dr Olevia Perches  . CORONARY ARTERY BYPASS GRAFT N/A 08/19/2018   Procedure: CORONARY ARTERY BYPASS GRAFTING (CABG) x 5  USING LEFT INTERNAL  MAMMARY ARTERY AND ENDOSCOPICALLY HARVESTED RIGHT SAPHENOUS VEIN.;  Surgeon: Ivin Poot, MD;  Location: [facility];  Service: Open Heart Surgery;  Laterality: N/A;  . EP IMPLANTABLE DEVICE N/A 11/21/2016   Procedure: Loop Recorder Insertion;  Surgeon: Thompson Grayer, MD;  Location: [place] CV [facility];  Service: Cardiovascular;  Laterality: N/A;  . ESI   2014   @L5 -S1 X3   . EXCISIONAL HEMORRHOIDECTOMY  ~ 1995  . FACIAL RECONSTRUCTION SURGERY Right 1990s   plating & pinning of maxilla  . INGUINAL HERNIA REPAIR Bilateral 1959  . LEFT HEART CATH AND CORONARY ANGIOGRAPHY N/A 08/14/2018   Procedure: LEFT HEART CATH AND CORONARY ANGIOGRAPHY;  Surgeon: Lorretta Harp, MD;  Location: [facility];  Service: Cardiovascular;  Laterality: N/A;  . POLYPECTOMY    . POSTERIOR LUMBAR FUSION  10/17/2013   L5-S1 ; [place]  . TEE WITHOUT CARDIOVERSION N/A 11/21/2016   Procedure: TRANSESOPHAGEAL ECHOCARDIOGRAM (TEE);  Surgeon: Larey Dresser, MD;  Location: [place];  Service: Cardiovascular;  Laterality: N/A;  . TEE WITHOUT CARDIOVERSION N/A 08/19/2018   Procedure: TRANSESOPHAGEAL ECHOCARDIOGRAM (TEE);  Surgeon: Prescott Gum, Collier Salina, MD;  Location: [facility];  Service: Open Heart Surgery;  Laterality: N/A;       Family History  Problem Relation Age of Onset  . Heart attack Father 7  . Hypertension Mother   . Stroke Mother        X38  . Diabetes Sister        "pre Diabetes"  . Stroke Maternal Uncle   . Cancer Neg Hx   . Colon cancer Neg Hx   . Rectal cancer Neg Hx   . Stomach cancer Neg Hx   . Colon polyps Neg Hx   . Esophageal cancer Neg Hx   . Headache Neg Hx   . Migraines Neg Hx     Social History   Tobacco Use  . Smoking status: Former Smoker    Packs/day: 0.25    Years: 6.00    Pack years: 1.50    Types: Cigarettes    Quit date: 12/17/1980    Years since quitting: 40.3  . Smokeless tobacco: Never Used  . Tobacco comment: smoked off and on 1966- 1982, up to < 5 cigarettes / day  Vaping Use  . Vaping Use: Never used  Substance Use Topics  . Alcohol use: Yes    Alcohol/week: 4.0 standard drinks    Types: 4 Glasses of wine per week    Comment:  socially  . Drug use: Never    Home Medications Prior to Admission medications   Medication Sig Start Date End Date Taking? Authorizing Provider  clopidogrel (PLAVIX)  75 MG tablet Take 1 tablet (75 mg total) by mouth daily. 01/09/21   Binnie Rail, MD  fluticasone (FLONASE) 50 MCG/ACT nasal spray Place 2 sprays into both nostrils daily. 09/11/20   [provider]  glucose blood test strip 1 each by Other route as needed. Use as instructed     [provider]  hydrochlorothiazide (HYDRODIURIL) 25 MG tablet Take 1 tablet (25 mg total) by mouth daily. 01/19/20   Binnie Rail, MD  Lancets (FREESTYLE) lancets 1 each by Other route 2 (two) times daily. Use to help check blood sugar twice a day. Dx E11.9 09/26/16   Binnie Rail, MD  metFORMIN (GLUCOPHAGE) 1000 MG tablet TAKE 1 TABLET(1000 MG) BY MOUTH TWICE DAILY WITH A MEAL 03/13/21   Burns, Claudina Lick,  MD  metoprolol tartrate (LOPRESSOR) 25 MG tablet TAKE 1 TABLET BY MOUTH TWICE DAILY. OVERDUE FOR ANNUAL APPOINTMENT 02/27/21   Binnie Rail, MD  Omega-3 Fatty Acids (FISH OIL) 1000 MG CAPS Take 1 capsule (1,000 mg total) by mouth daily. 08/23/18   Lars Pinks M, PA-C  ramipril (ALTACE) 5 MG capsule TAKE 2 CAPSULES(10 MG) BY MOUTH DAILY 03/08/21   Binnie Rail, MD  rosuvastatin (CRESTOR) 20 MG tablet TAKE 1 TABLET(20 MG) BY MOUTH DAILY 04/05/21   Lorretta Harp, MD  RYBELSUS 3 MG TABS TAKE 1 TABLET BY MOUTH DAILY BEFORE BREAKFAST 03/29/21   Binnie Rail, MD  topiramate (TOPAMAX) 50 MG tablet Take 1 tablet (50 mg total) by mouth 2 (two) times daily. 11/14/20   Lomax, Amy, NP  zolpidem (AMBIEN) 5 MG tablet TAKE 1 TABLET BY MOUTH AT BEDTIME AS NEEDED 04/17/21   Binnie Rail, MD    Allergies    Oxycodone  Review of Systems   Review of Systems  Constitutional: Negative for fever.  HENT: Negative for sore throat.   Eyes: Negative for redness.  Respiratory: Negative for cough and shortness of breath.   Cardiovascular: Negative for chest pain.  Gastrointestinal: Negative for abdominal pain and vomiting.  Genitourinary: Negative for flank pain.  Musculoskeletal: Negative for neck pain and neck  stiffness.  Skin: Negative for rash.  Neurological: Negative for weakness and numbness.       Chronic/recurrent headaches in past 1-2 years - spouse notes extensive evaluation for same (pcp, neurology, ENT, ophthy) - no definitive diagnosis.   Hematological: Does not bruise/bleed easily.  Psychiatric/Behavioral:       Sleepy/drowsy    Physical Exam Updated Vital Signs BP (!) 102/58   Pulse (!) 51   Resp 19   SpO2 100%   Physical Exam Vitals and nursing note reviewed.  Constitutional:      Appearance: Normal appearance. He is well-developed.  HENT:     Head: Atraumatic.     Nose: Nose normal.     Mouth/Throat:     Mouth: Mucous membranes are moist.     Pharynx: Oropharynx is clear.  Eyes:     General: No scleral icterus.    Extraocular Movements: Extraocular movements intact.     Conjunctiva/sclera: Conjunctivae normal.     Pupils: Pupils are equal, round, and reactive to light.  Neck:     Vascular: No carotid bruit.     Trachea: No tracheal deviation.     Comments: No stiffness or rigidity Cardiovascular:     Rate and Rhythm: Normal rate and regular rhythm.     Pulses: Normal pulses.     Heart sounds: Normal heart sounds. No murmur heard. No friction rub. No gallop.   Pulmonary:     Effort: Pulmonary effort is normal. No accessory muscle usage or respiratory distress.     Breath sounds: Normal breath sounds.  Abdominal:     General: Bowel sounds are normal. There is no distension.     Palpations: Abdomen is soft.     Tenderness: There is no abdominal tenderness.  Genitourinary:    Comments: No cva tenderness. Musculoskeletal:        General: No swelling or tenderness.     Cervical back: Normal range of motion and neck supple. No rigidity.  Skin:    General: Skin is warm and dry.     Findings: No rash.  Neurological:     Mental Status: He is alert.  Comments: Very drowsy - easily arousable, but drifts off to sleep very quickly. Moves bil extremities  purposefully. Motor/sens grossly intact bil.   Psychiatric:     Comments: Very drowsy/sleepy.      ED Results / Procedures / Treatments   Labs (all labs ordered are listed, but only abnormal results are displayed) Results for orders placed or performed during the hospital encounter of 04/19/21  Ethanol  Result Value Ref Range   Alcohol, Ethyl (B) <10 <10 mg/dL  Lactic acid, plasma  Result Value Ref Range   Lactic Acid, Venous 1.5 0.5 - 1.9 mmol/L  Comprehensive metabolic panel  Result Value Ref Range   Sodium 136 135 - 145 mmol/L   Potassium 4.8 3.5 - 5.1 mmol/L   Chloride 109 98 - 111 mmol/L   CO2 19 (L) 22 - 32 mmol/L   Glucose, Bld 304 (H) 70 - 99 mg/dL   BUN 28 (H) 8 - 23 mg/dL   Creatinine, Ser 1.58 (H) 0.61 - 1.24 mg/dL   Calcium 9.1 8.9 - 10.3 mg/dL   Total Protein 6.2 (L) 6.5 - 8.1 g/dL   Albumin 3.6 3.5 - 5.0 g/dL   AST 18 15 - 41 U/L   ALT 20 0 - 44 U/L   Alkaline Phosphatase 54 38 - 126 U/L   Total Bilirubin 0.8 0.3 - 1.2 mg/dL   GFR, Estimated 46 (L) >60 mL/min   Anion gap 8 5 - 15  CBC with Differential  Result Value Ref Range   WBC 9.5 4.0 - 10.5 K/uL   RBC 3.74 (L) 4.22 - 5.81 MIL/uL   Hemoglobin 11.6 (L) 13.0 - 17.0 g/dL   HCT 34.6 (L) 39.0 - 52.0 %   MCV 92.5 80.0 - 100.0 fL   MCH 31.0 26.0 - 34.0 pg   MCHC 33.5 30.0 - 36.0 g/dL   RDW 13.0 11.5 - 15.5 %   Platelets 227 150 - 400 K/uL   nRBC 0.0 0.0 - 0.2 %   Neutrophils Relative % 81 %   Neutro Abs 7.7 1.7 - 7.7 K/uL   Lymphocytes Relative 12 %   Lymphs Abs 1.1 0.7 - 4.0 K/uL   Monocytes Relative 4 %   Monocytes Absolute 0.4 0.1 - 1.0 K/uL   Eosinophils Relative 2 %   Eosinophils Absolute 0.2 0.0 - 0.5 K/uL   Basophils Relative 1 %   Basophils Absolute 0.1 0.0 - 0.1 K/uL   Immature Granulocytes 0 %   Abs Immature Granulocytes 0.03 0.00 - 0.07 K/uL  CBG monitoring, ED  Result Value Ref Range   Glucose-Capillary 256 (H) 70 - 99 mg/dL  I-Stat arterial blood gas, [facility] ED)  Result Value Ref  Range   pH, Arterial 7.332 (L) 7.350 - 7.450   pCO2 arterial 33.2 32.0 - 48.0 mmHg   pO2, Arterial 102 83.0 - 108.0 mmHg   Bicarbonate 17.6 (L) 20.0 - 28.0 mmol/L   TCO2 19 (L) 22 - 32 mmol/L   O2 Saturation 97.0 %   Acid-base deficit 8.0 (H) 0.0 - 2.0 mmol/L   Sodium 139 135 - 145 mmol/L   Potassium 4.6 3.5 - 5.1 mmol/L   Calcium, Ion 1.26 1.15 - 1.40 mmol/L   HCT 28.0 (L) 39.0 - 52.0 %   Hemoglobin 9.5 (L) 13.0 - 17.0 g/dL   Collection site Radial    Drawn by RT    Sample type ARTERIAL   Troponin I (High Sensitivity)  Result Value Ref Range   Troponin  I (High Sensitivity) 4 <18 ng/L    EKG None  Radiology No results found.  Procedures Procedures   Medications Ordered in ED Medications  naloxone [facility]) injection 2 mg (2 mg Intravenous Given 04/19/21 2134)  sodium chloride 0.9 % bolus 1,000 mL (1,000 mLs Intravenous New Bag/Given 04/19/21 2140)  sodium chloride 0.9 % bolus 1,000 mL (1,000 mLs Intravenous New Bag/Given 04/19/21 2140)    ED Course  I have reviewed the triage vital signs and the nursing notes.  Pertinent labs & imaging results that were available during my care of the patient were reviewed by me and considered in my medical decision making (see chart for details).    MDM Rules/Calculators/A&P                         Iv ns bolus (initial bp was low). Workup ordered from triage.  Stat labs. Continuous pulse ox and cardiac monitoring.   Reviewed nursing notes and prior charts for additional history.   Labs reviewed/interpreted by me - wbc normal. Glucose elevated. No increased anion gap. hco3 sl low, and cr elevated from baseline - c/w dehydration, poor po intake. Lactate normal.   With initial liter ns bolus, bp improved.   Patient remains drowsy but easily arousable. Given timing of taking muscle relaxer and new med, fluvoxamine, in setting relatively little po intake, feel like due to effect/side effects of meds.  Discussed w pharmacist - indicates in  setting po po intake/dehydration, and elevated cr - possible scenario of increased absorption with decreased clearance (+/- additional impact of being on other meds, new meds, interaction, etc.).   After 2 liters ns in ED, patient less drowsy, much more alert, oriented, quickly responsive. Po fluids.   2350, CT result pending - if normal, and continued improvement, feel stable for d/c then, with close pcp f/u.   Signed out to night blue doc.    Final Clinical Impression(s) / ED Diagnoses Final diagnoses:  None    Rx / DC Orders ED Discharge Orders    None       Lajean Saver, MD 04/19/21 2351

## 2021-04-19 NOTE — Discharge Instructions (Addendum)
It was our pleasure to provide your ER care today - we hope that you feel better. Rest. Drink plenty of fluids/make sure to stay well hydrated.   We discussed your case with our pharmacist - it is important to know that in setting of dehydration or recent decreased oral intake, you can get increased absorption of certain medications, as well as decreased clearance of those meds - the result can be to increase the effects and side effects (such as sedation) of that medication.  In addition, when taking several medications, different meds can potentiate the effects of other meds.   To help limit the chance of these effects, make sure to eat regularly, stay well hydrated, and discuss with your doctor minimizing the number and dose of sedating medications.  Avoid taking the topamax, fluvoxamine and/or ambien together. Also make sure not to drive if/when taking sedating meds.   From today's labs, your kidney function tests are elevated from baseline, and glucose level is also high. Follow diabetic eating plan, monitor sugars, take your diabetes meds, and follow up with your doctor in the next 1-2 days - have them recheck your kidney function tests then to ensure return to baseline. Also avoid anti-inflammatory meds such as ibuprofen/motrin or naprosyn/aleve, as well as Electrical engineer.   Return to ER if worse, new symptoms, fevers, trouble breathing, new/severe pain, or other concern.

## 2021-04-20 ENCOUNTER — Emergency Department (HOSPITAL_COMMUNITY): Payer: PPO

## 2021-04-20 DIAGNOSIS — G9340 Encephalopathy, unspecified: Secondary | ICD-10-CM | POA: Diagnosis not present

## 2021-04-20 LAB — URINALYSIS, COMPLETE (UACMP) WITH MICROSCOPIC
Bilirubin Urine: NEGATIVE
Glucose, UA: >=500 mg/dL — AB
Hgb urine dipstick: NEGATIVE
Ketones, ur: NEGATIVE mg/dL
Leukocytes,Ua: NEGATIVE
Nitrite: NEGATIVE
Protein, ur: NEGATIVE mg/dL
Specific Gravity, Urine: 1.018 (ref 1.005–1.030)
pH: 5 (ref 5.0–8.0)

## 2021-04-20 NOTE — ED Notes (Signed)
Pt has low blood pressure, called charge to expedite room assignment at this time.

## 2021-04-20 NOTE — ED Provider Notes (Signed)
I assumed care in signout to follow-up on imaging and labs.  CT head negative.  Urinalysis negative.  Patient is overall feeling improved and is awake and alert. Patient will be discharged home   Ripley Fraise, MD 04/20/21 [phone]

## 2021-04-20 NOTE — ED Notes (Signed)
E-signature pad unavailable at time of pt discharge. This RN discussed discharge materials with pt and answered all pt questions. Pt stated understanding of discharge material. ? ?

## 2021-04-20 NOTE — ED Notes (Signed)
Pt ambulated self in hall with this RN. Able to support self & denies dizziness, weakness, and SOB.

## 2021-04-24 LAB — CULTURE, BLOOD (ROUTINE X 2)
Culture: NO GROWTH
Special Requests: ADEQUATE

## 2021-05-03 DIAGNOSIS — I6782 Cerebral ischemia: Secondary | ICD-10-CM | POA: Diagnosis not present

## 2021-05-03 DIAGNOSIS — H5713 Ocular pain, bilateral: Secondary | ICD-10-CM | POA: Diagnosis not present

## 2021-05-03 DIAGNOSIS — H53123 Transient visual loss, bilateral: Secondary | ICD-10-CM | POA: Diagnosis not present

## 2021-05-10 ENCOUNTER — Other Ambulatory Visit: Payer: Self-pay | Admitting: Cardiovascular Disease

## 2021-05-10 DIAGNOSIS — H5713 Ocular pain, bilateral: Secondary | ICD-10-CM | POA: Diagnosis not present

## 2021-05-10 DIAGNOSIS — J310 Chronic rhinitis: Secondary | ICD-10-CM | POA: Diagnosis not present

## 2021-05-10 DIAGNOSIS — H53123 Transient visual loss, bilateral: Secondary | ICD-10-CM | POA: Diagnosis not present

## 2021-05-16 ENCOUNTER — Other Ambulatory Visit: Payer: Self-pay | Admitting: Internal Medicine

## 2021-05-25 ENCOUNTER — Other Ambulatory Visit: Payer: Self-pay | Admitting: Cardiovascular Disease

## 2021-06-05 ENCOUNTER — Other Ambulatory Visit: Payer: Self-pay | Admitting: Cardiovascular Disease

## 2021-06-05 DIAGNOSIS — M545 Low back pain, unspecified: Secondary | ICD-10-CM | POA: Diagnosis not present

## 2021-06-11 ENCOUNTER — Other Ambulatory Visit: Payer: Self-pay | Admitting: Internal Medicine

## 2021-06-17 ENCOUNTER — Other Ambulatory Visit: Payer: Self-pay | Admitting: Internal Medicine

## 2021-06-28 DIAGNOSIS — E113293 Type 2 diabetes mellitus with mild nonproliferative diabetic retinopathy without macular edema, bilateral: Secondary | ICD-10-CM | POA: Diagnosis not present

## 2021-06-28 DIAGNOSIS — H40011 Open angle with borderline findings, low risk, right eye: Secondary | ICD-10-CM | POA: Diagnosis not present

## 2021-06-28 DIAGNOSIS — H25813 Combined forms of age-related cataract, bilateral: Secondary | ICD-10-CM | POA: Diagnosis not present

## 2021-06-28 DIAGNOSIS — H35033 Hypertensive retinopathy, bilateral: Secondary | ICD-10-CM | POA: Diagnosis not present

## 2021-07-19 ENCOUNTER — Encounter: Payer: Self-pay | Admitting: Internal Medicine

## 2021-08-23 NOTE — Progress Notes (Signed)
Subjective:    Patient ID: Alex Bryant, male    DOB: 08/22/1951, 70 y.o.   MRN: [id number]  HPI He is here for an acute visit for cold symptoms.   A covid test was negative.   He has battled headaches for a couple of years.  Over the summer it was pretty good but they increased in the past 2 weeks or so.  He developed a cough after a recent trip to [place].  He is coughing up some green mucus.  He has had that for at least 10 days. Feels that he has an acute infection.  He does have follow-up with ENT that he has seen in the past for the sinus pain.     Medications and allergies reviewed with patient and updated if appropriate.  Patient Active Problem List   Diagnosis Date Noted   Acute sinus infection 11/01/2020   Chronic ethmoidal sinusitis 03/10/2020   Hypertensive retinopathy 01/13/2020   Strain of thoracic paraspinal muscles excluding T1 and T2 levels 07/13/2019   Paroxysmal atrial fibrillation ([place]) 09/08/2018   S/P CABG x 5 08/19/2018   CAD (coronary artery disease) 08/14/2018   Progressive angina ([place]) 08/12/2018   Vitamin B 12 deficiency 11/21/2017   Diabetic neuropathy ([place]) 01/22/2017   Cerebral thrombosis with cerebral infarction 11/20/2016   History of CVA (cerebrovascular accident) without residual deficits 11/20/2016   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     glucose blood test strip 1 each by Other route as needed. Use as instructed      hydrochlorothiazide (HYDRODIURIL) 25 MG tablet Take 1 tablet (25 mg total) by mouth daily. 30 tablet 3   Lancets (FREESTYLE) lancets 1 each by Other route 2 (two) times daily. Use to help check blood sugar twice a day. Dx E11.9 100 each 3   metFORMIN (GLUCOPHAGE) 1000 MG tablet TAKE 1 TABLET(1000 MG) BY MOUTH TWICE DAILY WITH A MEAL 180 tablet 1   metoprolol tartrate (LOPRESSOR) 25 MG tablet TAKE 1 TABLET BY MOUTH TWICE DAILY. OVERDUE FOR ANNUAL APPOINTMENT 180 tablet 1   Omega-3 Fatty Acids (FISH OIL) 1000 MG CAPS Take 1 capsule (1,000 mg total) by mouth daily.  0   ramipril (ALTACE) 5 MG capsule TAKE 2 CAPSULES(10 MG) BY MOUTH DAILY 180 capsule 0   rosuvastatin (CRESTOR) 20 MG tablet Take 1 tablet (20 mg total) by mouth daily. Please make overdue appt with Dr. Gwenlyn Found before anymore refills. Thank you 3rd and Final Attempt 15 tablet 0   topiramate (TOPAMAX) 50 MG tablet Take 1 tablet (50 mg total) by mouth 2 (two) times daily. 90 tablet 3   zolpidem (AMBIEN) 5 MG tablet  TAKE 1 TABLET BY MOUTH AT BEDTIME AS NEEDED 30 tablet 5   RYBELSUS 3 MG TABS TAKE 1 TABLET BY MOUTH DAILY BEFORE BREAKFAST (Patient not taking: Reported on 08/24/2021) 30 tablet 2   No current facility-administered medications on file prior to visit.    Past Medical History:  Diagnosis Date   Allergy    Coronary artery disease    Diet-controlled diabetes mellitus ([place])    AODM   History of gout    Hyperlipidemia    Hypertension    Progressive angina ([place]) 08/12/2018   Chest pain   Stroke ([place]) 11/19/2016   "fully recovered" (08/14/2018)   Tick bite [id number]   Erlichiosis;Eulis Canner MD,  ID    Past Surgical History:  Procedure Laterality Date   BACK SURGERY     COLONOSCOPY     COLONOSCOPY W/ POLYPECTOMY  2010; 2015   Dr Olevia Perches   CORONARY ARTERY BYPASS GRAFT N/A 08/19/2018   Procedure: CORONARY ARTERY BYPASS GRAFTING (CABG) x 5  USING LEFT INTERNAL MAMMARY ARTERY AND ENDOSCOPICALLY HARVESTED RIGHT SAPHENOUS VEIN.;  Surgeon: Ivin Poot, MD;  Location: [facility];  Service: Open Heart Surgery;  Laterality: N/A;   EP IMPLANTABLE DEVICE N/A 11/21/2016   Procedure: Loop Recorder Insertion;  Surgeon: Thompson Grayer, MD;  Location: [facility];  Service: Cardiovascular;  Laterality: N/A;   ESI  2014   '@L5'$ -S1 X3    EXCISIONAL HEMORRHOIDECTOMY  ~ [place] Right 1990s   plating & pinning of maxilla   INGUINAL HERNIA REPAIR Bilateral 1959   LEFT HEART CATH AND CORONARY ANGIOGRAPHY N/A 08/14/2018   Procedure: LEFT HEART CATH AND CORONARY ANGIOGRAPHY;  Surgeon: Lorretta Harp, MD;  Location: [facility];  Service: Cardiovascular;  Laterality: N/A;   POLYPECTOMY     POSTERIOR LUMBAR FUSION  10/17/2013   L5-S1 ; [facility]   TEE WITHOUT CARDIOVERSION N/A 11/21/2016   Procedure: TRANSESOPHAGEAL ECHOCARDIOGRAM (TEE);  Surgeon: Larey Dresser, MD;  Location: [facility];  Service: Cardiovascular;  Laterality: N/A;   TEE WITHOUT CARDIOVERSION N/A 08/19/2018   Procedure: TRANSESOPHAGEAL ECHOCARDIOGRAM (TEE);  Surgeon: Prescott Gum, Collier Salina, MD;  Location: [facility];  Service: Open Heart Surgery;  Laterality: N/A;    Social History   Socioeconomic History   Marital status: Married    Spouse name: Not on file   Number of children: 3   Years of education: Not on file   Highest education level: Master's degree (e.g., MA, MS, MEng, MEd, MSW, MBA)  Occupational History   Not on file  Tobacco Use   Smoking status: Former    Packs/day: 0.25    Years: 6.00    Pack years: 1.50    Types: Cigarettes    Quit date: 12/17/1980    Years since quitting:  40.7   Smokeless tobacco: Never   Tobacco comments:    smoked off and on 1966- 1982, up to < 5 cigarettes / day  Vaping Use   Vaping Use: Never used  Substance and Sexual Activity   Alcohol use: Yes    Alcohol/week: 4.0 standard drinks    Types: 4 Glasses of wine per week    Comment:  socially   Drug use: Never   Sexual activity: Yes  Other Topics Concern   Not on file  Social History Narrative   Lives at home with wife    Right handed   Caffeine: decaf coffee   Social Determinants of  Health   Financial Resource Strain: Low Risk    Difficulty of Paying Living Expenses: Not hard at all  Food Insecurity: No Food Insecurity   Worried About [place] in the Last Year: Never true   Ran Out of Food in the Last Year: Never true  Transportation Needs: No Transportation Needs   Lack of Transportation (Medical): No   Lack of Transportation (Non-Medical): No  Physical Activity: Sufficiently Active   Days of Exercise per Week: 5 days   Minutes of Exercise per Session: 30 min  Stress: No Stress Concern Present   Feeling of Stress : Not at all  Social Connections: Socially Integrated   Frequency of Communication with Friends and Family: More than three times a week   Frequency of Social Gatherings with Friends and Family: More than three times a week   Attends Religious Services: More than 4 times per year   Active Member of [employer] or Organizations: Yes   Attends Music therapist: More than 4 times per year   Marital Status: Married    Family History  Problem Relation Age of Onset   Heart attack Father 52   Hypertension Mother    Stroke Mother        X2   Diabetes Sister        "pre Diabetes"   Stroke Maternal Uncle    Cancer Neg Hx    Colon cancer Neg Hx    Rectal cancer Neg Hx    Stomach cancer Neg Hx    Colon polyps Neg Hx    Esophageal cancer Neg Hx    Headache Neg Hx    Migraines Neg Hx     Review of Systems  Constitutional:  Negative for  chills and fever.  HENT:  Positive for congestion, rhinorrhea and sinus pressure (right maxillary and b/l orbital area). Negative for ear pain (ear popping) and sore throat.   Respiratory:  Positive for cough (productive green mucus). Negative for shortness of breath and wheezing.   Neurological:  Positive for headaches. Negative for dizziness and light-headedness.      Objective:   Vitals:   08/24/21 0743  BP: (!) 156/88  Pulse: 63  Temp: 98.4 F (36.9 C)  SpO2: 96%   BP Readings from Last 3 Encounters:  08/24/21 (!) 156/88  04/20/21 132/76  03/01/21 128/80   Wt Readings from Last 3 Encounters:  08/24/21 204 lb (92.5 kg)  03/01/21 203 lb 9.6 oz (92.4 kg)  11/14/20 208 lb (94.3 kg)   Body mass index is 27.67 kg/m.   Physical Exam    GENERAL APPEARANCE: Appears stated age, well appearing, NAD EYES: conjunctiva clear, no icterus HENT: bilateral tympanic membranes and ear canals normal, oropharynx with no erythema or exudates, trachea midline, no cervical or supraclavicular lymphadenopathy LUNGS: Unlabored breathing, good air entry bilaterally, clear to auscultation without wheeze or crackles CARDIOVASCULAR: Normal S1,S2 , no edema SKIN: Warm, dry      Assessment & Plan:    See Problem List for Assessment and Plan of chronic medical problems.    This visit occurred during the SARS-CoV-2 public health emergency.  Safety protocols were in place, including screening questions prior to the visit, additional usage of staff PPE, and extensive cleaning of exam room while observing appropriate contact time as indicated for disinfecting solutions.

## 2021-08-24 ENCOUNTER — Encounter: Payer: Self-pay | Admitting: Internal Medicine

## 2021-08-24 ENCOUNTER — Other Ambulatory Visit: Payer: Self-pay

## 2021-08-24 ENCOUNTER — Ambulatory Visit (INDEPENDENT_AMBULATORY_CARE_PROVIDER_SITE_OTHER): Payer: PPO | Admitting: Internal Medicine

## 2021-08-24 VITALS — BP 156/88 | HR 63 | Temp 98.4°F | Ht 72.0 in | Wt 204.0 lb

## 2021-08-24 DIAGNOSIS — J0100 Acute maxillary sinusitis, unspecified: Secondary | ICD-10-CM | POA: Diagnosis not present

## 2021-08-24 DIAGNOSIS — I10 Essential (primary) hypertension: Secondary | ICD-10-CM

## 2021-08-24 MED ORDER — AMOXICILLIN-POT CLAVULANATE 875-125 MG PO TABS: 1.0000 | ORAL_TABLET | Freq: Two times a day (BID) | ORAL | 0 refills | Status: DC

## 2021-08-24 NOTE — Assessment & Plan Note (Signed)
Acute Likely bacterial  Start Augmentin 875-125 mg BID x 10 day otc cold medications Rest, fluid Call if no improvement  

## 2021-08-24 NOTE — Assessment & Plan Note (Signed)
Chronic Blood pressure elevated here today He does check it at home and states that he is well controlled at home-advised that he monitor it closely for the next couple of weeks He is due for a physical next month-advised that he schedule this so we can really check it then Continue HCTZ 25 mg daily, metoprolol 25 mg twice daily, ramipril 10 mg daily

## 2021-08-24 NOTE — Patient Instructions (Signed)
Medications changes include :   augmentin for your sinus infection  Your prescription(s) have been submitted to your pharmacy. Please take as directed and contact our office if you believe you are having problem(s) with the medication(s).   Sinusitis, Adult Sinusitis is inflammation of your sinuses. Sinuses are hollow spaces in the bones around your face. Your sinuses are located: Around your eyes. In the middle of your forehead. Behind your nose. In your cheekbones. Mucus normally drains out of your sinuses. When your nasal tissues become inflamed or swollen, mucus can become trapped or blocked. This allows bacteria, viruses, and fungi to grow, which leads to infection. Most infections of the sinuses are caused by a virus. Sinusitis can develop quickly. It can last for up to 4 weeks (acute) or for more than 12 weeks (chronic). Sinusitis often develops after a cold. What are the causes? This condition is caused by anything that creates swelling in the sinuses or stops mucus from draining. This includes: Allergies. Asthma. Infection from bacteria or viruses. Deformities or blockages in your nose or sinuses. Abnormal growths in the nose (nasal polyps). Pollutants, such as chemicals or irritants in the air. Infection from fungi (rare). What increases the risk? You are more likely to develop this condition if you: Have a weak body defense system (immune system). Do a lot of swimming or diving. Overuse nasal sprays. Smoke. What are the signs or symptoms? The main symptoms of this condition are pain and a feeling of pressure around the affected sinuses. Other symptoms include: Stuffy nose or congestion. Thick drainage from your nose. Swelling and warmth over the affected sinuses. Headache. Upper toothache. A cough that may get worse at night. Extra mucus that collects in the throat or the back of the nose (postnasal drip). Decreased sense of smell and taste. Fatigue. A  fever. Sore throat. Bad breath. How is this diagnosed? This condition is diagnosed based on: Your symptoms. Your medical history. A physical exam. Tests to find out if your condition is acute or chronic. This may include: Checking your nose for nasal polyps. Viewing your sinuses using a device that has a light (endoscope). Testing for allergies or bacteria. Imaging tests, such as an MRI or CT scan. In rare cases, a bone biopsy may be done to rule out more serious types of fungal sinus disease. How is this treated? Treatment for sinusitis depends on the cause and whether your condition is chronic or acute. If caused by a virus, your symptoms should go away on their own within 10 days. You may be given medicines to relieve symptoms. They include: Medicines that shrink swollen nasal passages (topical intranasal decongestants). Medicines that treat allergies (antihistamines). A spray that eases inflammation of the nostrils (topical intranasal corticosteroids). Rinses that help get rid of thick mucus in your nose (nasal saline washes). If caused by bacteria, your health care provider may recommend waiting to see if your symptoms improve. Most bacterial infections will get better without antibiotic medicine. You may be given antibiotics if you have: A severe infection. A weak immune system. If caused by narrow nasal passages or nasal polyps, you may need to have surgery. Follow these instructions at home: Medicines Take, use, or apply over-the-counter and prescription medicines only as told by your health care provider. These may include nasal sprays. If you were prescribed an antibiotic medicine, take it as told by your health care provider. Do not stop taking the antibiotic even if you start to feel better. Hydrate and  humidify  Drink enough fluid to keep your urine pale yellow. Staying hydrated will help to thin your mucus. Use a cool mist humidifier to keep the humidity level in your  home above 50%. Inhale steam for 10-15 minutes, 3-4 times a day, or as told by your health care provider. You can do this in the bathroom while a hot shower is running. Limit your exposure to cool or dry air. Rest Rest as much as possible. Sleep with your head raised (elevated). Make sure you get enough sleep each night. General instructions  Apply a warm, moist washcloth to your face 3-4 times a day or as told by your health care provider. This will help with discomfort. Wash your hands often with soap and water to reduce your exposure to germs. If soap and water are not available, use hand sanitizer. Do not smoke. Avoid being around people who are smoking (secondhand smoke). Keep all follow-up visits as told by your health care provider. This is important. Contact a health care provider if: You have a fever. Your symptoms get worse. Your symptoms do not improve within 10 days. Get help right away if: You have a severe headache. You have persistent vomiting. You have severe pain or swelling around your face or eyes. You have vision problems. You develop confusion. Your neck is stiff. You have trouble breathing. Summary Sinusitis is soreness and inflammation of your sinuses. Sinuses are hollow spaces in the bones around your face. This condition is caused by nasal tissues that become inflamed or swollen. The swelling traps or blocks the flow of mucus. This allows bacteria, viruses, and fungi to grow, which leads to infection. If you were prescribed an antibiotic medicine, take it as told by your health care provider. Do not stop taking the antibiotic even if you start to feel better. Keep all follow-up visits as told by your health care provider. This is important. This information is not intended to replace advice given to you by your health care provider. Make sure you discuss any questions you have with your health care provider. Document Revised: 05/05/2018 Document Reviewed:  05/05/2018 Elsevier Patient Education  2022 [employer].

## 2021-09-26 ENCOUNTER — Other Ambulatory Visit: Payer: Self-pay | Admitting: Internal Medicine

## 2021-10-02 DIAGNOSIS — G629 Polyneuropathy, unspecified: Secondary | ICD-10-CM | POA: Diagnosis not present

## 2021-10-02 DIAGNOSIS — M792 Neuralgia and neuritis, unspecified: Secondary | ICD-10-CM | POA: Diagnosis not present

## 2021-10-02 DIAGNOSIS — Z981 Arthrodesis status: Secondary | ICD-10-CM | POA: Diagnosis not present

## 2021-10-02 DIAGNOSIS — Z6827 Body mass index (BMI) 27.0-27.9, adult: Secondary | ICD-10-CM | POA: Diagnosis not present

## 2021-11-20 DIAGNOSIS — Z131 Encounter for screening for diabetes mellitus: Secondary | ICD-10-CM | POA: Diagnosis not present

## 2021-11-21 ENCOUNTER — Other Ambulatory Visit: Payer: Self-pay | Admitting: Internal Medicine

## 2021-11-28 DIAGNOSIS — M545 Low back pain, unspecified: Secondary | ICD-10-CM | POA: Diagnosis not present

## 2021-11-28 DIAGNOSIS — M25552 Pain in left hip: Secondary | ICD-10-CM | POA: Diagnosis not present

## 2021-12-04 ENCOUNTER — Other Ambulatory Visit: Payer: Self-pay | Admitting: Internal Medicine

## 2021-12-07 ENCOUNTER — Other Ambulatory Visit: Payer: Self-pay | Admitting: Internal Medicine

## 2021-12-29 DIAGNOSIS — J310 Chronic rhinitis: Secondary | ICD-10-CM | POA: Diagnosis not present

## 2022-01-07 ENCOUNTER — Other Ambulatory Visit: Payer: Self-pay | Admitting: Internal Medicine

## 2022-02-14 ENCOUNTER — Telehealth: Payer: Self-pay | Admitting: Internal Medicine

## 2022-02-14 NOTE — Telephone Encounter (Signed)
LVM for pt to rtn my call to schedule awv with nha.. Please schedule AWV if pt calls the office.  

## 2022-02-19 ENCOUNTER — Other Ambulatory Visit: Payer: Self-pay | Admitting: Internal Medicine

## 2022-03-06 ENCOUNTER — Other Ambulatory Visit: Payer: Self-pay | Admitting: Internal Medicine

## 2022-03-15 DIAGNOSIS — M542 Cervicalgia: Secondary | ICD-10-CM | POA: Diagnosis not present

## 2022-03-15 DIAGNOSIS — M545 Low back pain, unspecified: Secondary | ICD-10-CM | POA: Diagnosis not present

## 2022-03-20 ENCOUNTER — Other Ambulatory Visit: Payer: Self-pay | Admitting: Internal Medicine

## 2022-03-21 ENCOUNTER — Telehealth: Payer: Self-pay

## 2022-03-21 DIAGNOSIS — M545 Low back pain, unspecified: Secondary | ICD-10-CM | POA: Diagnosis not present

## 2022-03-21 DIAGNOSIS — M542 Cervicalgia: Secondary | ICD-10-CM | POA: Diagnosis not present

## 2022-03-21 MED ORDER — ZOLPIDEM TARTRATE 5 MG PO TABS: 5.0000 mg | ORAL_TABLET | Freq: Every evening | ORAL | 0 refills | Status: DC | PRN

## 2022-03-21 NOTE — Telephone Encounter (Signed)
Message left for patient today and my-chart as well. ?

## 2022-03-21 NOTE — Telephone Encounter (Signed)
Pt was advised that he needed a appt for his refill.  ? ?Pt is requesting a refill for zolpidem (AMBIEN) 5 MG tablet ? ?Pt set up an appt for Monday April 10 wants to know if he can get enough medication to get him to that appt. ? ?Please advise ?

## 2022-03-21 NOTE — Telephone Encounter (Signed)
Medication refilled-he needs to keep the appointment on Monday. ?

## 2022-03-25 ENCOUNTER — Encounter: Payer: Self-pay | Admitting: Internal Medicine

## 2022-03-25 NOTE — Patient Instructions (Addendum)
? ? ? ?  Blood work was ordered.   ? ? ?Medications changes include :  none  ? ? ?Your prescription(s) have been sent to your pharmacy.  ? ? ? ?Return in about 6 months (around 09/25/2022) for follow up. ? ?

## 2022-03-25 NOTE — Progress Notes (Signed)
? ? ? ? ?Subjective:  ? ? Patient ID: Alex Bryant, male    DOB: Oct 19, 1951, 71 y.o.   MRN: [id number] ? ?This visit occurred during the SARS-CoV-2 public health emergency.  Safety protocols were in place, including screening questions prior to the visit, additional usage of staff PPE, and extensive cleaning of exam room while observing appropriate contact time as indicated for disinfecting solutions.   ? ? ?HPI ?Alex Bryant is here for follow up of his chronic medical problems, including htn, CAD, Afib, insomnia, DM ? ?He is no longer taking the crestor.  He is exercising regularly.  Eating good - eats more carbs than sugars.   ? ? ?Medications and allergies reviewed with patient and updated if appropriate. ? ?Current Outpatient Medications on File Prior to Visit  ?Medication Sig Dispense Refill  ? clopidogrel (PLAVIX) 75 MG tablet TAKE 1 TABLET(75 MG) BY MOUTH DAILY 90 tablet 1  ? glucose blood test strip 1 each by Other route as needed. Use as instructed     ? Lancets (FREESTYLE) lancets 1 each by Other route 2 (two) times daily. Use to help check blood sugar twice a day. Dx E11.9 100 each 3  ? metFORMIN (GLUCOPHAGE) 1000 MG tablet TAKE 1 TABLET(1000 MG) BY MOUTH TWICE DAILY WITH A MEAL 180 tablet 1  ? metoprolol tartrate (LOPRESSOR) 25 MG tablet TAKE 1 TABLET BY MOUTH TWICE DAILY 180 tablet 1  ? Omega-3 Fatty Acids (FISH OIL) 1000 MG CAPS Take 1 capsule (1,000 mg total) by mouth daily.  0  ? zolpidem (AMBIEN) 5 MG tablet Take 1 tablet (5 mg total) by mouth at bedtime as needed. 30 tablet 0  ? rosuvastatin (CRESTOR) 20 MG tablet Take 1 tablet (20 mg total) by mouth daily. Please make overdue appt with Dr. Gwenlyn Found before anymore refills. Thank you 3rd and Final Attempt (Patient not taking: Reported on 03/26/2022) 15 tablet 0  ? ?No current facility-administered medications on file prior to visit.  ? ? ? ?Review of Systems  ?Constitutional:  Negative for fever.  ?Respiratory:  Negative for cough, shortness of breath and  wheezing.   ?Cardiovascular:  Negative for chest pain, palpitations and leg swelling.  ?Neurological:  Negative for light-headedness and headaches.  ? ?   ?Objective:  ? ?Vitals:  ? 03/26/22 1459  ?BP: 132/64  ?Pulse: 73  ?Temp: 98 ?F (36.7 ?C)  ?SpO2: 98%  ? ?BP Readings from Last 3 Encounters:  ?03/26/22 132/64  ?08/24/21 (!) 156/88  ?04/20/21 132/76  ? ?Wt Readings from Last 3 Encounters:  ?03/26/22 214 lb 8 oz (97.3 kg)  ?08/24/21 204 lb (92.5 kg)  ?03/01/21 203 lb 9.6 oz (92.4 kg)  ? ?Body mass index is 29.09 kg/m?. ? ?  ?Physical Exam ?Constitutional:   ?   General: He is not in acute distress. ?   Appearance: Normal appearance. He is not ill-appearing.  ?HENT:  ?   Head: Normocephalic and atraumatic.  ?Eyes:  ?   Conjunctiva/sclera: Conjunctivae normal.  ?Cardiovascular:  ?   Rate and Rhythm: Normal rate and regular rhythm.  ?   Heart sounds: Normal heart sounds. No murmur heard. ?Pulmonary:  ?   Effort: Pulmonary effort is normal. No respiratory distress.  ?   Breath sounds: Normal breath sounds. No wheezing or rales.  ?Musculoskeletal:  ?   Right lower leg: No edema.  ?   Left lower leg: No edema.  ?Skin: ?   General: Skin is warm and dry.  ?  Findings: No rash.  ?Neurological:  ?   Mental Status: He is alert. Mental status is at baseline.  ?Psychiatric:     ?   Mood and Affect: Mood normal.  ? ?   ? ?Lab Results  ?Component Value Date  ? WBC 7.6 03/26/2022  ? HGB 13.7 03/26/2022  ? HCT 40.1 03/26/2022  ? PLT 247.0 03/26/2022  ? GLUCOSE 239 (H) 03/26/2022  ? CHOL 219 (H) 03/26/2022  ? TRIG 380.0 (H) 03/26/2022  ? HDL 47.70 03/26/2022  ? LDLDIRECT 148.0 03/26/2022  ? [place] 52 10/11/2020  ? ALT 15 03/26/2022  ? AST 10 03/26/2022  ? NA 139 03/26/2022  ? K 3.9 03/26/2022  ? CL 103 03/26/2022  ? CREATININE 1.14 03/26/2022  ? BUN 19 03/26/2022  ? CO2 28 03/26/2022  ? TSH 1.14 10/11/2020  ? PSA 1.50 05/19/2019  ? INR 1.44 08/19/2018  ? HGBA1C 7.2 (H) 03/26/2022  ? MICROALBUR 3.1 (H) 03/26/2022   ? ? ? ?Assessment & Plan:  ? ? ?See Problem List for Assessment and Plan of chronic medical problems.  ? ? ?

## 2022-03-26 ENCOUNTER — Other Ambulatory Visit: Payer: Self-pay | Admitting: Internal Medicine

## 2022-03-26 ENCOUNTER — Ambulatory Visit (INDEPENDENT_AMBULATORY_CARE_PROVIDER_SITE_OTHER): Payer: PPO | Admitting: Internal Medicine

## 2022-03-26 VITALS — BP 132/64 | HR 73 | Temp 98.0°F | Ht 72.0 in | Wt 214.5 lb

## 2022-03-26 DIAGNOSIS — E782 Mixed hyperlipidemia: Secondary | ICD-10-CM | POA: Diagnosis not present

## 2022-03-26 DIAGNOSIS — I25110 Atherosclerotic heart disease of native coronary artery with unstable angina pectoris: Secondary | ICD-10-CM | POA: Diagnosis not present

## 2022-03-26 DIAGNOSIS — I10 Essential (primary) hypertension: Secondary | ICD-10-CM | POA: Diagnosis not present

## 2022-03-26 DIAGNOSIS — G479 Sleep disorder, unspecified: Secondary | ICD-10-CM

## 2022-03-26 DIAGNOSIS — I480 Paroxysmal atrial fibrillation: Secondary | ICD-10-CM

## 2022-03-26 LAB — CBC WITH DIFFERENTIAL/PLATELET
Basophils Absolute: 0.1 10*3/uL (ref 0.0–0.1)
Basophils Relative: 0.8 % (ref 0.0–3.0)
Eosinophils Absolute: 0.2 10*3/uL (ref 0.0–0.7)
Eosinophils Relative: 3.1 % (ref 0.0–5.0)
HCT: 40.1 % (ref 39.0–52.0)
Hemoglobin: 13.7 g/dL (ref 13.0–17.0)
Lymphocytes Relative: 27.9 % (ref 12.0–46.0)
Lymphs Abs: 2.1 10*3/uL (ref 0.7–4.0)
MCHC: 34 g/dL (ref 30.0–36.0)
MCV: 90.5 fl (ref 78.0–100.0)
Monocytes Absolute: 0.6 10*3/uL (ref 0.1–1.0)
Monocytes Relative: 7.6 % (ref 3.0–12.0)
Neutro Abs: 4.6 10*3/uL (ref 1.4–7.7)
Neutrophils Relative %: 60.6 % (ref 43.0–77.0)
Platelets: 247 10*3/uL (ref 150.0–400.0)
RBC: 4.44 Mil/uL (ref 4.22–5.81)
RDW: 13.9 % (ref 11.5–15.5)
WBC: 7.6 10*3/uL (ref 4.0–10.5)

## 2022-03-26 LAB — COMPREHENSIVE METABOLIC PANEL
ALT: 15 U/L (ref 0–53)
AST: 10 U/L (ref 0–37)
Albumin: 4.1 g/dL (ref 3.5–5.2)
Alkaline Phosphatase: 56 U/L (ref 39–117)
BUN: 19 mg/dL (ref 6–23)
CO2: 28 mEq/L (ref 19–32)
Calcium: 9.3 mg/dL (ref 8.4–10.5)
Chloride: 103 mEq/L (ref 96–112)
Creatinine, Ser: 1.14 mg/dL (ref 0.40–1.50)
GFR: 64.33 mL/min (ref >60.00)
Glucose, Bld: 239 mg/dL — ABNORMAL HIGH (ref 70–99)
Potassium: 3.9 mEq/L (ref 3.5–5.1)
Sodium: 139 mEq/L (ref 135–145)
Total Bilirubin: 0.5 mg/dL (ref 0.2–1.2)
Total Protein: 6.6 g/dL (ref 6.0–8.3)

## 2022-03-26 LAB — MICROALBUMIN / CREATININE URINE RATIO
Creatinine,U: 97.6 mg/dL
Microalb Creat Ratio: 3.2 mg/g (ref 0.0–30.0)
Microalb, Ur: 3.1 mg/dL — ABNORMAL HIGH (ref 0.0–1.9)

## 2022-03-26 LAB — LDL CHOLESTEROL, DIRECT: Direct LDL: 148 mg/dL

## 2022-03-26 LAB — HEMOGLOBIN A1C: Hgb A1c MFr Bld: 7.2 % — ABNORMAL HIGH (ref 4.6–6.5)

## 2022-03-26 LAB — LIPID PANEL
Cholesterol: 219 mg/dL — ABNORMAL HIGH (ref 0–200)
HDL: 47.7 mg/dL (ref >39.00)
NonHDL: 171.18
Total CHOL/HDL Ratio: 5
Triglycerides: 380 mg/dL — ABNORMAL HIGH (ref 0.0–149.0)
VLDL: 76 mg/dL — ABNORMAL HIGH (ref 0.0–40.0)

## 2022-03-26 MED ORDER — RAMIPRIL 5 MG PO CAPS: ORAL_CAPSULE | ORAL | 1 refills | Status: DC

## 2022-03-26 NOTE — Assessment & Plan Note (Signed)
Chronic ?Blood pressure well controlled ?CMP ?Continue metoprolol 25 mg twice daily, ramipril 10 mg daily ?

## 2022-03-26 NOTE — Assessment & Plan Note (Signed)
Chronic ?Fairly controlled, Stable ?Continue zolpidem 5 mg nightly as needed ?

## 2022-03-26 NOTE — Assessment & Plan Note (Signed)
Chronic ?Discontinue Crestor ?Check lipids-he is probably not fasting today ?Discussed the importance of Crestor for prevention of another stroke or heart attack ?Continue regular exercise, healthy diet ?

## 2022-03-26 NOTE — Assessment & Plan Note (Signed)
Chronic ?Has not seen cardiology in a while ?No concerning symptoms to suggest angina ?He is taking Plavix 75 mg daily and metoprolol 25 mg twice daily-we will continue ?Continue ramipril 10 mg daily ?He stopped taking Crestor-stressed the importance of taking Crestor ?

## 2022-03-27 ENCOUNTER — Encounter: Payer: Self-pay | Admitting: Internal Medicine

## 2022-03-27 DIAGNOSIS — M542 Cervicalgia: Secondary | ICD-10-CM | POA: Diagnosis not present

## 2022-03-27 DIAGNOSIS — M545 Low back pain, unspecified: Secondary | ICD-10-CM | POA: Diagnosis not present

## 2022-04-03 DIAGNOSIS — M542 Cervicalgia: Secondary | ICD-10-CM | POA: Diagnosis not present

## 2022-04-03 DIAGNOSIS — M545 Low back pain, unspecified: Secondary | ICD-10-CM | POA: Diagnosis not present

## 2022-04-15 NOTE — Progress Notes (Signed)
?Cardiology Office Note:   ? ?Date:  04/16/2022  ? ?ID:  Alex Bryant, DOB 29-Sep-1951, MRN [id number] ? ?PCP:  Binnie Rail, MD  ?Cardiologist:  Quay Burow, MD  ?Electrophysiologist:  None  ? ?Referring MD: Binnie Rail, MD  ? ?Chief Complaint  ?Patient presents with  ? Coronary Artery Disease  ? ? ?History of Present Illness:   ? ?Alex Bryant is a 71 y.o. male with a hx of CAD status post CABG 08/19/2018 (LIMA to LAD, SVG-ramus, SVG-OM1 and OM 2, SVG-PDA), T2DM, hypertension, hyperlipidemia, CVA who is referred by Dr. Quay Burow for evaluation of CAD.  Previously followed with Dr. Gwenlyn Found, last seen in 2020.  He reports that he has been doing well.  Denies any chest pain, dyspnea, lightheadedness, syncope, lower extremity edema, or palpitations.  Walks 8000-10,000 steps per day.  Denies any exertional symptoms.  Taking Plavix, denies any bleeding issues. ? ? ?BP Readings from Last 3 Encounters:  ?04/16/22 (!) 162/80  ?03/26/22 132/64  ?08/24/21 (!) 156/88  ? ? ? ? ?Past Medical History:  ?Diagnosis Date  ? Allergy   ? Coronary artery disease   ? Diet-controlled diabetes mellitus ([place])   ? AODM  ? History of gout   ? Hyperlipidemia   ? Hypertension   ? Progressive angina ([place]) 08/12/2018  ? Chest pain  ? Stroke [facility]) 11/19/2016  ? "fully recovered" (08/14/2018)  ? Tick bite 2001  ? Erlichiosis;Eulis Canner MD, ID  ? ? ?Past Surgical History:  ?Procedure Laterality Date  ? BACK SURGERY    ? COLONOSCOPY    ? COLONOSCOPY W/ POLYPECTOMY  2010; 2015  ? Dr Olevia Perches  ? CORONARY ARTERY BYPASS GRAFT N/A 08/19/2018  ? Procedure: CORONARY ARTERY BYPASS GRAFTING (CABG) x 5  USING LEFT INTERNAL MAMMARY ARTERY AND ENDOSCOPICALLY HARVESTED RIGHT SAPHENOUS VEIN.;  Surgeon: Ivin Poot, MD;  Location: [facility];  Service: Open Heart Surgery;  Laterality: N/A;  ? EP IMPLANTABLE DEVICE N/A 11/21/2016  ? Procedure: Loop Recorder Insertion;  Surgeon: Thompson Grayer, MD;  Location: [facility];  Service: Cardiovascular;  Laterality: N/A;  ?  ESI  2014  ? '@L5'$ -S1 X3   ? EXCISIONAL HEMORRHOIDECTOMY  ~ 1995  ? FACIAL RECONSTRUCTION SURGERY Right 1990s  ? plating & pinning of maxilla  ? INGUINAL HERNIA REPAIR Bilateral 1959  ? LEFT HEART CATH AND CORONARY ANGIOGRAPHY N/A 08/14/2018  ? Procedure: LEFT HEART CATH AND CORONARY ANGIOGRAPHY;  Surgeon: Lorretta Harp, MD;  Location: [facility];  Service: Cardiovascular;  Laterality: N/A;  ? POLYPECTOMY    ? POSTERIOR LUMBAR FUSION  10/17/2013  ? L5-S1 ; [place]  ? TEE WITHOUT CARDIOVERSION N/A 11/21/2016  ? Procedure: TRANSESOPHAGEAL ECHOCARDIOGRAM (TEE);  Surgeon: Larey Dresser, MD;  Location: [facility];  Service: Cardiovascular;  Laterality: N/A;  ? TEE WITHOUT CARDIOVERSION N/A 08/19/2018  ? Procedure: TRANSESOPHAGEAL ECHOCARDIOGRAM (TEE);  Surgeon: Prescott Gum, Collier Salina, MD;  Location: [facility];  Service: Open Heart Surgery;  Laterality: N/A;  ? ? ?Current Medications: ?Current Meds  ?Medication Sig  ? clopidogrel (PLAVIX) 75 MG tablet TAKE 1 TABLET(75 MG) BY MOUTH DAILY  ? glucose blood test strip 1 each by Other route as needed. Use as instructed   ? Lancets (FREESTYLE) lancets 1 each by Other route 2 (two) times daily. Use to help check blood sugar twice a day. Dx E11.9  ? metFORMIN (GLUCOPHAGE) 1000 MG tablet TAKE 1 TABLET(1000 MG) BY MOUTH TWICE  DAILY WITH A MEAL  ? metoprolol tartrate (LOPRESSOR) 25 MG tablet TAKE 1 TABLET BY MOUTH TWICE DAILY  ? Omega-3 Fatty Acids (FISH OIL) 1000 MG CAPS Take 1 capsule (1,000 mg total) by mouth daily.  ? zolpidem (AMBIEN) 5 MG tablet Take 1 tablet (5 mg total) by mouth at bedtime as needed.  ? [DISCONTINUED] ramipril (ALTACE) 5 MG capsule TAKE 2 CAPSULES(10 MG) BY MOUTH DAILY  ? [DISCONTINUED] rosuvastatin (CRESTOR) 20 MG tablet Take 1 tablet (20 mg total) by mouth daily. Please make overdue appt with Dr. Gwenlyn Found before anymore refills. Thank you 3rd and Final Attempt  ?  ? ?Allergies:   Oxycodone  ? ?Social History  ? ?Socioeconomic History  ? Marital  status: Married  ?  Spouse name: Not on file  ? Number of children: 3  ? Years of education: Not on file  ? Highest education level: Master's degree (e.g., MA, MS, MEng, MEd, MSW, MBA)  ?Occupational History  ? Not on file  ?Tobacco Use  ? Smoking status: Former  ?  Packs/day: 0.25  ?  Years: 6.00  ?  Pack years: 1.50  ?  Types: Cigarettes  ?  Quit date: 12/17/1980  ?  Years since quitting: 41.3  ? Smokeless tobacco: Never  ? Tobacco comments:  ?  smoked off and on 1966- 1982, up to < 5 cigarettes / day  ?Vaping Use  ? Vaping Use: Never used  ?Substance and Sexual Activity  ? Alcohol use: Yes  ?  Alcohol/week: 4.0 standard drinks  ?  Types: 4 Glasses of wine per week  ?  Comment:  socially  ? Drug use: Never  ? Sexual activity: Yes  ?Other Topics Concern  ? Not on file  ?Social History Narrative  ? Lives at home with wife   ? Right handed  ? Caffeine: decaf coffee  ? ?Social Determinants of Health  ? ?Financial Resource Strain: Not on file  ?Food Insecurity: Not on file  ?Transportation Needs: Not on file  ?Physical Activity: Not on file  ?Stress: Not on file  ?Social Connections: Not on file  ?  ? ?Family History: ?The patient's family history includes Diabetes in his sister; Heart attack (age of onset: 47) in his father; Hypertension in his mother; Stroke in his maternal uncle and mother. There is no history of Cancer, Colon cancer, Rectal cancer, Stomach cancer, Colon polyps, Esophageal cancer, Headache, or Migraines. ? ?ROS:   ?Please see the history of present illness.    ? All other systems reviewed and are negative. ? ?EKGs/Labs/Other Studies Reviewed:   ? ?The following studies were reviewed today: ? ? ?EKG:   ?04/16/2022: Sinus rhythm, rate 63, PACs ? ?Recent Labs: ?03/26/2022: ALT 15; BUN 19; Creatinine, Ser 1.14; Hemoglobin 13.7; Platelets 247.0; Potassium 3.9; Sodium 139  ?Recent Lipid Panel ?   ?Component Value Date/Time  ? CHOL 219 (H) 03/26/2022 1530  ? CHOL 130 12/18/2018 0907  ? CHOL 153 05/25/2015 0924   ? TRIG 380.0 (H) 03/26/2022 1530  ? TRIG 188 (H) 05/25/2015 0924  ? HDL 47.70 03/26/2022 1530  ? HDL 52 12/18/2018 0907  ? HDL 38 (L) 05/25/2015 0924  ? CHOLHDL 5 03/26/2022 1530  ? VLDL 76.0 (H) 03/26/2022 1530  ? LDLCALC 52 10/11/2020 1051  ? [place] 54 12/18/2018 0907  ? [place] 77 05/25/2015 0924  ? LDLDIRECT 148.0 03/26/2022 1530  ? ? ?Physical Exam:   ? ?VS:  BP (!) 162/80 (BP Location: Right Arm, Patient  Position: Sitting, Cuff Size: Normal)   Pulse 63   Ht 6' (1.829 m)   Wt 211 lb 12.8 oz (96.1 kg)   SpO2 96%   BMI 28.73 kg/m?    ? ?Wt Readings from Last 3 Encounters:  ?04/16/22 211 lb 12.8 oz (96.1 kg)  ?03/26/22 214 lb 8 oz (97.3 kg)  ?08/24/21 204 lb (92.5 kg)  ?  ? ?GEN:  Well nourished, well developed in no acute distress ?HEENT: Normal ?NECK: No JVD; No carotid bruits ?LYMPHATICS: No lymphadenopathy ?CARDIAC: RRR, no murmurs, rubs, gallops ?RESPIRATORY:  Clear to auscultation without rales, wheezing or rhonchi  ?ABDOMEN: Soft, non-tender, non-distended ?MUSCULOSKELETAL:  No edema; No deformity  ?SKIN: Warm and dry ?NEUROLOGIC:  Alert and oriented x 3 ?PSYCHIATRIC:  Normal affect  ? ?ASSESSMENT:   ? ?1. Coronary artery disease involving native coronary artery of native heart without angina pectoris   ?2. Essential hypertension   ?3. Hyperlipidemia, unspecified hyperlipidemia type   ?4. Cerebrovascular accident (CVA), unspecified mechanism ([place])   ? ?PLAN:   ? ? ?CAD: status post CABG 08/19/2018 (LIMA to LAD, SVG-ramus, SVG-OM1 and OM 2, SVG-PDA) ?-Continue Plavix 75 mg daily ?-He has been off his statin x3 months.  We will restart rosuvastatin 5 mg daily.  Check fasting lipid panel in 2 to 3 months ?-Continue metoprolol 25 mg twice daily ? ?Hypertension: On metoprolol 25 mg twice daily and ramipril 5 mg twice daily.  BP elevated, will increase ramipril to 10 mg twice daily.  Check BMET in 1 to 2 weeks.  Asked to check BP at home daily for next 2 weeks and bring log to pharmacy hypertension clinic.   Also asked to bring home BP monitor to calibrate. ? ?Hyperlipidemia: LDL 148 on 03/26/2022, had been off rosuvastatin.  Will restart rosuvastatin and check fasting lipid panel in 2 to 3 months.  Goal LDL less

## 2022-04-16 ENCOUNTER — Encounter: Payer: Self-pay | Admitting: Cardiology

## 2022-04-16 ENCOUNTER — Ambulatory Visit (INDEPENDENT_AMBULATORY_CARE_PROVIDER_SITE_OTHER): Payer: PPO | Admitting: Cardiology

## 2022-04-16 VITALS — BP 162/80 | HR 63 | Ht 72.0 in | Wt 211.8 lb

## 2022-04-16 DIAGNOSIS — I10 Essential (primary) hypertension: Secondary | ICD-10-CM

## 2022-04-16 DIAGNOSIS — H5203 Hypermetropia, bilateral: Secondary | ICD-10-CM | POA: Diagnosis not present

## 2022-04-16 DIAGNOSIS — H524 Presbyopia: Secondary | ICD-10-CM | POA: Diagnosis not present

## 2022-04-16 DIAGNOSIS — J209 Acute bronchitis, unspecified: Secondary | ICD-10-CM | POA: Diagnosis not present

## 2022-04-16 DIAGNOSIS — I2510 Atherosclerotic heart disease of native coronary artery without angina pectoris: Secondary | ICD-10-CM

## 2022-04-16 DIAGNOSIS — E113293 Type 2 diabetes mellitus with mild nonproliferative diabetic retinopathy without macular edema, bilateral: Secondary | ICD-10-CM | POA: Diagnosis not present

## 2022-04-16 DIAGNOSIS — I639 Cerebral infarction, unspecified: Secondary | ICD-10-CM

## 2022-04-16 DIAGNOSIS — E785 Hyperlipidemia, unspecified: Secondary | ICD-10-CM | POA: Diagnosis not present

## 2022-04-16 DIAGNOSIS — H35033 Hypertensive retinopathy, bilateral: Secondary | ICD-10-CM | POA: Diagnosis not present

## 2022-04-16 DIAGNOSIS — H25813 Combined forms of age-related cataract, bilateral: Secondary | ICD-10-CM | POA: Diagnosis not present

## 2022-04-16 DIAGNOSIS — J069 Acute upper respiratory infection, unspecified: Secondary | ICD-10-CM | POA: Diagnosis not present

## 2022-04-16 DIAGNOSIS — H52223 Regular astigmatism, bilateral: Secondary | ICD-10-CM | POA: Diagnosis not present

## 2022-04-16 DIAGNOSIS — H40011 Open angle with borderline findings, low risk, right eye: Secondary | ICD-10-CM | POA: Diagnosis not present

## 2022-04-16 MED ORDER — ROSUVASTATIN CALCIUM 20 MG PO TABS: 20.0000 mg | ORAL_TABLET | Freq: Every day | ORAL | 3 refills | Status: DC

## 2022-04-16 MED ORDER — RAMIPRIL 10 MG PO CAPS: 10.0000 mg | ORAL_CAPSULE | Freq: Two times a day (BID) | ORAL | 3 refills | Status: DC

## 2022-04-16 NOTE — Patient Instructions (Signed)
Medication Instructions:  ?RESTART rosuvastatin (Crestor) 20 mg daily ?INCREASE ramipril to 10 mg two times daily ? ?Please check your blood pressure at home twice daily, write it down.  Bring log and blood pressure cuff to appointment with pharmacist. ? ?*If you need a refill on your cardiac medications before your next appointment, please call your pharmacy* ? ? ?Lab Work: ?Please return for labs in 1-2 weeks (BMET) ? ?Please return for FASTING labs in 2-3 months (Lipid) ? ?Our in office lab hours are Monday-Friday 8:00-4:00, closed for lunch 12:45-1:45 pm.  No appointment needed. ? ?[facility] locations: ?  ?[place] ?- [address] (Dr. Kennon Holter office) ?- [place] ([facility]) ?- [address] ?- [place]  ?[place] ?- [address]  ?  ?[place]  ?- [address]  ?  ?[place] ?- [place]  ?[place]  ?- [place] ?- [place] [address] ([facility]) ? ?Follow-Up: ?At [facility], you and your health needs are our priority.  As part of our continuing mission to provide you with exceptional heart care, we have created designated Provider Care Teams.  These Care Teams include your primary Cardiologist (physician) and Advanced Practice Providers (APPs -  Physician Assistants and Nurse Practitioners) who all work together to provide you with the care you need, when you need it. ? ?We recommend signing up for the patient portal called "MyChart".  Sign up information is provided on this After Visit Summary.  MyChart is used to connect with patients for Virtual Visits (Telemedicine).  Patients are able to view lab/test results, encounter notes, upcoming appointments, etc.  Non-urgent messages can be sent to your provider as well.   ?To learn more about what you can do with MyChart, go to [email or web address].   ? ?Your next appointment:   ?2  weeks with pharmacist ?--bring blood pressure cuff and blood pressure log to appointment ? ?6 months with Dr. Gardiner Rhyme  ? ?Important Information About Sugar ? ? ? ? ? ? ?

## 2022-04-18 ENCOUNTER — Other Ambulatory Visit: Payer: Self-pay | Admitting: Internal Medicine

## 2022-04-26 DIAGNOSIS — M545 Low back pain, unspecified: Secondary | ICD-10-CM | POA: Diagnosis not present

## 2022-05-02 ENCOUNTER — Ambulatory Visit: Payer: PPO | Admitting: Pharmacist Clinician (PhC)/ Clinical Pharmacy Specialist

## 2022-05-02 DIAGNOSIS — I2510 Atherosclerotic heart disease of native coronary artery without angina pectoris: Secondary | ICD-10-CM | POA: Diagnosis not present

## 2022-05-02 DIAGNOSIS — I10 Essential (primary) hypertension: Secondary | ICD-10-CM

## 2022-05-02 LAB — BASIC METABOLIC PANEL
BUN/Creatinine Ratio: 19 (ref 10–24)
BUN: 22 mg/dL (ref 8–27)
CO2: 25 mmol/L (ref 20–29)
Calcium: 9.7 mg/dL (ref 8.6–10.2)
Chloride: 102 mmol/L (ref 96–106)
Creatinine, Ser: 1.16 mg/dL (ref 0.76–1.27)
Glucose: 184 mg/dL — ABNORMAL HIGH (ref 70–99)
Potassium: 4.7 mmol/L (ref 3.5–5.2)
Sodium: 140 mmol/L (ref 134–144)
eGFR: 67 mL/min/{1.73_m2} (ref >59)

## 2022-05-02 MED ORDER — AMLODIPINE BESYLATE 5 MG PO TABS: 5.0000 mg | ORAL_TABLET | Freq: Every day | ORAL | 3 refills | Status: DC

## 2022-05-02 NOTE — Assessment & Plan Note (Signed)
Patient with essential hypertension, possibly related to multiple prednisone tapers in the past year.  Regardless, readings are still elevated and need to be addressed.  Reviewed options with patient and will start him on amlodipine 5 mg daily.  He should purchase a new BP cuff and monitor home readings.  Discussed option that we may be able to cut back or discontinue amlodipine should he not have further need for prednisone in the future.  Advised if home BP readings drop to < 110 on multiple occasions he can try cutting amlodipine tablet in half.  Will have him get BMET today (for dose increase in ramipril) and see him back in 2 months for follow up.  ?

## 2022-05-02 NOTE — Progress Notes (Signed)
? ? ? ?05/02/2022 ?Alex Bryant ?1951-09-27 ?[id number] ? ? ?HPI:  Alex Bryant is a 71 y.o. male patient of Dr Gardiner Rhyme, with a PMH below who presents today for hypertension clinic evaluation.  He was seen just a few weeks ago, and at that time his BP was noted to be 162/80.  Dr. Gardiner Rhyme increased his ramipril from 5 mg bid to 10 mg bid.  He was asked to monitor readings at home and return in 2 weeks with that information, for follow up.   ? ?Today he returns for follow up.  He brings his home meter, however it is 55-77 years old and is no longer working.   He notes that he has never had problems with hypertension until the past year or so, and he believes it is related to chronic prednisone use.  Reports having back problems for which he gets prednisone a few times each year, but has also been fighting bronchial issues and it was prescribed again.  Thinks that without prednisone his pressure is more in the 409 systolic range.     ? ?Past Medical History: ?CAD 2019 CABG x 5; on plavix, rosuvastatin, metoprolol  ?HLD 4/23 LDL 148, restarted rosuvastatin 04/16/22, goal < 55  ?CVA 2017 - recovered w/o residual defects,   ?DM2 4/23  A1c 7.2 on metformin  ?  ? ?Blood Pressure Goal:  130/80 ? ?Current Medications: ramipril 10 mg bid, metoprolol tart 25 mg bid ? ?Family Hx: mother had hypertension later in life, died at 19; father with CABG at 60, died at 33 from accident; no heart issues in siblings, sister is diabetic; 3 kids, no hypertension ? ?Social Hx: quit tobacco 1982, social weekend drinking; decaf coffee, only occasional soda ? ?Diet: mix of home and restaurant;  eating out is mostly sit-down, only occasional fried foods; not enough vegetables although does like salad ? ?Exercise: walks 8-12000 steps per day ? ?Home BP readings: no home readings, meter no longer working ? ?Intolerances: no cardiac medication intolerances ? ?Labs: 4/23:  Na 139, K 3.9, Glu 239, BUN 19, SCr 1.14, GFR 64.3 ? ? ?Wt Readings from  Last 3 Encounters:  ?05/02/22 205 lb (93 kg)  ?04/16/22 211 lb 12.8 oz (96.1 kg)  ?03/26/22 214 lb 8 oz (97.3 kg)  ? ?BP Readings from Last 3 Encounters:  ?05/02/22 (!) 154/86  ?04/16/22 (!) 162/80  ?03/26/22 132/64  ? ?Pulse Readings from Last 3 Encounters:  ?05/02/22 62  ?04/16/22 63  ?03/26/22 73  ? ? ?Current Outpatient Medications  ?Medication Sig Dispense Refill  ? amLODipine (NORVASC) 5 MG tablet Take 1 tablet (5 mg total) by mouth daily. 90 tablet 3  ? clopidogrel (PLAVIX) 75 MG tablet TAKE 1 TABLET(75 MG) BY MOUTH DAILY 90 tablet 1  ? glucose blood test strip 1 each by Other route as needed. Use as instructed     ? Lancets (FREESTYLE) lancets 1 each by Other route 2 (two) times daily. Use to help check blood sugar twice a day. Dx E11.9 100 each 3  ? metFORMIN (GLUCOPHAGE) 1000 MG tablet TAKE 1 TABLET(1000 MG) BY MOUTH TWICE DAILY WITH A MEAL 180 tablet 1  ? metoprolol tartrate (LOPRESSOR) 25 MG tablet TAKE 1 TABLET BY MOUTH TWICE DAILY 180 tablet 1  ? ramipril (ALTACE) 10 MG capsule Take 1 capsule (10 mg total) by mouth 2 (two) times daily. 180 capsule 3  ? rosuvastatin (CRESTOR) 20 MG tablet Take 1 tablet (20 mg total) by mouth  daily. 90 tablet 3  ? zolpidem (AMBIEN) 5 MG tablet TAKE 1 TABLET(5 MG) BY MOUTH AT BEDTIME AS NEEDED 30 tablet 5  ? ?No current facility-administered medications for this visit.  ? ? ?Allergies  ?Allergen Reactions  ? Oxycodone Anxiety  ?  [name], confused, and constipation  ? ? ?Past Medical History:  ?Diagnosis Date  ? Allergy   ? Coronary artery disease   ? Diet-controlled diabetes mellitus ([place])   ? AODM  ? History of gout   ? Hyperlipidemia   ? Hypertension   ? Progressive angina ([place]) 08/12/2018  ? Chest pain  ? Stroke [facility]) 11/19/2016  ? "fully recovered" (08/14/2018)  ? Tick bite 2001  ? Erlichiosis;Eulis Canner MD, ID  ? ? ?Blood pressure (!) 154/86, pulse 62, resp. rate 17, height 6' (1.829 m), weight 205 lb (93 kg), SpO2 98 %. ? ?Essential hypertension ?Patient with essential  hypertension, possibly related to multiple prednisone tapers in the past year.  Regardless, readings are still elevated and need to be addressed.  Reviewed options with patient and will start him on amlodipine 5 mg daily.  He should purchase a new BP cuff and monitor home readings.  Discussed option that we may be able to cut back or discontinue amlodipine should he not have further need for prednisone in the future.  Advised if home BP readings drop to < 110 on multiple occasions he can try cutting amlodipine tablet in half.  Will have him get BMET today (for dose increase in ramipril) and see him back in 2 months for follow up.  ? ? ?Tommy Medal PharmD CPP [facility] ?[place] [address] ?[place], [place] [address] ?[phone] ?

## 2022-05-02 NOTE — Patient Instructions (Signed)
Return for a a follow up appointment Tuesday July 18 at 8:30 am ? ?Go to the lab today to check kidney function ? ?Check your blood pressure at home daily and keep record of the readings. ? ?Take your BP meds as follows  ? Start amlodipine 5 mg once daily. ? Continue with all other medications ? ?Bring all of your meds, your BP cuff and your record of home blood pressures to your next appointment.  Exercise as you?re able, try to walk approximately 30 minutes per day.  Keep salt intake to a minimum, especially watch canned and prepared boxed foods.  Eat more fresh fruits and vegetables and fewer canned items.  Avoid eating in fast food restaurants.  ? ? HOW TO TAKE YOUR BLOOD PRESSURE: ?Rest 5 minutes before taking your blood pressure. ? Don?t smoke or drink caffeinated beverages for at least 30 minutes before. ?Take your blood pressure before (not after) you eat. ?Sit comfortably with your back supported and both feet on the floor (don?t cross your legs). ?Elevate your arm to heart level on a table or a desk. ?Use the proper sized cuff. It should fit smoothly and snugly around your bare upper arm. There should be enough room to slip a fingertip under the cuff. The bottom edge of the cuff should be 1 inch above the crease of the elbow. ?Ideally, take 3 measurements at one sitting and record the average. ? ? ?

## 2022-05-16 DIAGNOSIS — M545 Low back pain, unspecified: Secondary | ICD-10-CM | POA: Diagnosis not present

## 2022-06-12 ENCOUNTER — Ambulatory Visit (INDEPENDENT_AMBULATORY_CARE_PROVIDER_SITE_OTHER): Payer: PPO

## 2022-06-12 VITALS — BP 122/60 | HR 55 | Temp 97.3°F | Ht 72.0 in | Wt 204.2 lb

## 2022-06-12 DIAGNOSIS — Z0000 Encounter for general adult medical examination without abnormal findings: Secondary | ICD-10-CM

## 2022-06-12 NOTE — Progress Notes (Signed)
Subjective:   Alex Bryant is a 71 y.o. male who presents for Medicare Annual/Subsequent preventive examination.  Review of Systems     Cardiac Risk Factors include: advanced age (>35men, >20 women);diabetes mellitus;dyslipidemia;family history of premature cardiovascular disease;hypertension;male gender     Objective:    Today's Vitals   06/12/22 0845  BP: 122/60  Pulse: (!) 55  Temp: (!) 97.3 F (36.3 C)  SpO2: 96%  Weight: 204 lb 3.2 oz (92.6 kg)  Height: 6' (1.829 m)  PainSc: 0-No pain   Body mass index is 27.69 kg/m.     06/12/2022    9:09 AM 03/01/2021    8:38 AM 08/14/2018    4:14 PM 08/14/2018    8:08 AM 11/20/2016    1:00 AM 09/09/2014    8:39 AM  Advanced Directives  Does Patient Have a Medical Advance Directive? Yes Yes Yes Yes No Yes  Type of Advance Directive Living will;Healthcare Power of Attorney Living will;Healthcare Power of [employer] Power of [place];Living will Healthcare Power of [place];Living will  Healthcare Power of [place];Living will  Does patient want to make changes to medical advance directive? No - Patient declined No - Patient declined No - Patient declined No - Patient declined    Copy of Healthcare Power of Attorney in Chart? No - copy requested No - copy requested No - copy requested No - copy requested    Would patient like information on creating a medical advance directive?     No - Patient declined     Current Medications (verified) Outpatient Encounter Medications as of 06/12/2022  Medication Sig   amLODipine (NORVASC) 5 MG tablet Take 1 tablet (5 mg total) by mouth daily.   clopidogrel (PLAVIX) 75 MG tablet TAKE 1 TABLET(75 MG) BY MOUTH DAILY   glucose blood test strip 1 each by Other route as needed. Use as instructed    Lancets (FREESTYLE) lancets 1 each by Other route 2 (two) times daily. Use to help check blood sugar twice a day. Dx E11.9   metFORMIN (GLUCOPHAGE) 1000 MG tablet TAKE 1 TABLET(1000 MG) BY MOUTH  TWICE DAILY WITH A MEAL   metoprolol tartrate (LOPRESSOR) 25 MG tablet TAKE 1 TABLET BY MOUTH TWICE DAILY   ramipril (ALTACE) 10 MG capsule Take 1 capsule (10 mg total) by mouth 2 (two) times daily.   rosuvastatin (CRESTOR) 20 MG tablet Take 1 tablet (20 mg total) by mouth daily.   zolpidem (AMBIEN) 5 MG tablet TAKE 1 TABLET(5 MG) BY MOUTH AT BEDTIME AS NEEDED   No facility-administered encounter medications on file as of 06/12/2022.    Allergies (verified) Oxycodone   History: Past Medical History:  Diagnosis Date   Allergy    Coronary artery disease    Diet-controlled diabetes mellitus (HCC)    AODM   History of gout    Hyperlipidemia    Hypertension    Progressive angina (HCC) 08/12/2018   Chest pain   Stroke (HCC) 11/19/2016   "fully recovered" (08/14/2018)   Tick bite 2001   Erlichiosis;Darlina Sicilian MD, ID   Past Surgical History:  Procedure Laterality Date   BACK SURGERY     COLONOSCOPY     COLONOSCOPY W/ POLYPECTOMY  2010; 2015   Dr Juanda Chance   CORONARY ARTERY BYPASS GRAFT N/A 08/19/2018   Procedure: CORONARY ARTERY BYPASS GRAFTING (CABG) x 5  USING LEFT INTERNAL MAMMARY ARTERY AND ENDOSCOPICALLY HARVESTED RIGHT SAPHENOUS VEIN.;  Surgeon: Kerin Perna, MD;  Location: [facility] OR;  Service: Open Heart Surgery;  Laterality: N/A;   EP IMPLANTABLE DEVICE N/A 11/21/2016   Procedure: Loop Recorder Insertion;  Surgeon: Hillis Range, MD;  Location: [facility] INVASIVE CV LAB;  Service: Cardiovascular;  Laterality: N/A;   ESI  2014   @L5 -S1 X3    EXCISIONAL HEMORRHOIDECTOMY  ~ 1995   FACIAL RECONSTRUCTION SURGERY Right 1990s   plating & pinning of maxilla   INGUINAL HERNIA REPAIR Bilateral 1959   LEFT HEART CATH AND CORONARY ANGIOGRAPHY N/A 08/14/2018   Procedure: LEFT HEART CATH AND CORONARY ANGIOGRAPHY;  Surgeon: Runell Gess, MD;  Location: [facility] INVASIVE CV LAB;  Service: Cardiovascular;  Laterality: N/A;   POLYPECTOMY     POSTERIOR LUMBAR FUSION  10/17/2013   L5-S1 ; [facility] , [place]    TEE WITHOUT CARDIOVERSION N/A 11/21/2016   Procedure: TRANSESOPHAGEAL ECHOCARDIOGRAM (TEE);  Surgeon: Laurey Morale, MD;  Location: [facility] ENDOSCOPY;  Service: Cardiovascular;  Laterality: N/A;   TEE WITHOUT CARDIOVERSION N/A 08/19/2018   Procedure: TRANSESOPHAGEAL ECHOCARDIOGRAM (TEE);  Surgeon: Donata Clay, Theron Arista, MD;  Location: [facility] OR;  Service: Open Heart Surgery;  Laterality: N/A;   Family History  Problem Relation Age of Onset   Heart attack Father 84   Hypertension Mother    Stroke Mother        X2   Diabetes Sister        "pre Diabetes"   Stroke Maternal Uncle    Cancer Neg Hx    Colon cancer Neg Hx    Rectal cancer Neg Hx    Stomach cancer Neg Hx    Colon polyps Neg Hx    Esophageal cancer Neg Hx    Headache Neg Hx    Migraines Neg Hx    Social History   Socioeconomic History   Marital status: Married    Spouse name: Not on file   Number of children: 3   Years of education: Not on file   Highest education level: Master's degree (e.g., MA, MS, MEng, MEd, MSW, MBA)  Occupational History   Not on file  Tobacco Use   Smoking status: Former    Packs/day: 0.25    Years: 6.00    Total pack years: 1.50    Types: Cigarettes    Quit date: 12/17/1980    Years since quitting: 41.5   Smokeless tobacco: Never   Tobacco comments:    smoked off and on 1966- 1982, up to < 5 cigarettes / day  Vaping Use   Vaping Use: Never used  Substance and Sexual Activity   Alcohol use: Yes    Alcohol/week: 4.0 standard drinks of alcohol    Types: 4 Glasses of wine per week    Comment:  socially   Drug use: Never   Sexual activity: Yes  Other Topics Concern   Not on file  Social History Narrative   Lives at home with wife    Right handed   Caffeine: decaf coffee   Social Determinants of Health   Financial Resource Strain: Low Risk  (06/12/2022)   Overall Financial Resource Strain (CARDIA)    Difficulty of Paying Living Expenses: Not hard at all  Food Insecurity: No Food Insecurity  (06/12/2022)   Hunger Vital Sign    Worried About Running Out of Food in the Last Year: Never true    Ran Out of Food in the Last Year: Never true  Transportation Needs: No Transportation Needs (06/12/2022)   PRAPARE - Transportation    Lack of Transportation (  Medical): No    Lack of Transportation (Non-Medical): No  Physical Activity: Sufficiently Active (06/12/2022)   Exercise Vital Sign    Days of Exercise per Week: 7 days    Minutes of Exercise per Session: 30 min  Stress: No Stress Concern Present (06/12/2022)   [employer] of Occupational Health - Occupational Stress Questionnaire    Feeling of Stress : Not at all  Social Connections: Socially Integrated (06/12/2022)   Social Connection and Isolation Panel [NHANES]    Frequency of Communication with Friends and Family: More than three times a week    Frequency of Social Gatherings with Friends and Family: More than three times a week    Attends Religious Services: More than 4 times per year    Active Member of [employer] or Organizations: Yes    Attends Engineer, structural: More than 4 times per year    Marital Status: Married    Tobacco Counseling Counseling given: Not Answered Tobacco comments: smoked off and on 1966- 1982, up to < 5 cigarettes / day   Clinical Intake:  Pre-visit preparation completed: Yes  Pain : No/denies pain Pain Score: 0-No pain     BMI - recorded: 27.69 Nutritional Status: BMI 25 -29 Overweight Nutritional Risks: None Diabetes: Yes CBG done?: No Did pt. bring in CBG monitor from home?: No  How often do you need to have someone help you when you read instructions, pamphlets, or other written materials from your doctor or pharmacy?: 1 - Never What is the last grade level you completed in school?: Master's degree in Business  Diabetic? yes  Interpreter Needed?: No  Information entered by :: Susie Cassette, LPN.   Activities of Daily Living    06/12/2022    9:10 AM  In your  present state of health, do you have any difficulty performing the following activities:  Hearing? 0  Vision? 0  Difficulty concentrating or making decisions? 0  Walking or climbing stairs? 0  Dressing or bathing? 0  Doing errands, shopping? 0  Preparing Food and eating ? N  Using the Toilet? N  In the past six months, have you accidently leaked urine? N  Do you have problems with loss of bowel control? N  Managing your Medications? N  Managing your Finances? N  Housekeeping or managing your Housekeeping? N    Patient Care Team: Pincus Sanes, MD as PCP - General (Internal Medicine) Runell Gess, MD as PCP - Cardiology (Cardiology) Dorita Fray, MD as Referring Physician (Orthopedic Surgery) Nelson Chimes, MD as Consulting Physician (Ophthalmology)  Indicate any recent Medical Services you may have received from other than [facility] providers in the past year (date may be approximate).     Assessment:   This is a routine wellness examination for Yeager.  Hearing/Vision screen Hearing Screening - Comments:: Patient denied any hearing difficulty.   No hearing aids.  Vision Screening - Comments:: Patient does wear corrective lenses/contacts.  Eye exam done by: Nelson Chimes, MD.   Dietary issues and exercise activities discussed: Current Exercise Habits: Home exercise routine, Type of exercise: walking, Time (Minutes): 30, Frequency (Times/Week): 7, Weekly Exercise (Minutes/Week): 210, Intensity: Moderate, Exercise limited by: cardiac condition(s)   Goals Addressed             This Visit's Progress    To lose 10 pounds.        Depression Screen    06/12/2022    9:10 AM 03/26/2022    3:01 PM 03/01/2021  8:37 AM 11/10/2019    8:01 AM 05/19/2019    8:26 AM 11/21/2017   10:36 AM 02/29/2016    4:54 PM  PHQ 2/9 Scores  PHQ - 2 Score 0 0 0 0 0 0 0    Fall Risk    06/12/2022    9:10 AM 03/26/2022    3:01 PM 03/01/2021    8:38 AM 03/10/2020    8:26 AM 11/10/2019    8:01  AM  Fall Risk   Falls in the past year? 0 0 0 0 0  Number falls in past yr: 0  0 0 0  Injury with Fall? 0  0 0   Risk for fall due to : No Fall Risks  No Fall Risks    Follow up Falls evaluation completed  Falls evaluation completed      FALL RISK PREVENTION PERTAINING TO THE HOME:  Any stairs in or around the home? No  If so, are there any without handrails? No  Home free of loose throw rugs in walkways, pet beds, electrical cords, etc? Yes  Adequate lighting in your home to reduce risk of falls? Yes   ASSISTIVE DEVICES UTILIZED TO PREVENT FALLS:  Life alert? Yes  (Apple Watch) Use of a cane, walker or w/c? No  Grab bars in the bathroom? No  Shower chair or bench in shower? Yes  Elevated toilet seat or a handicapped toilet? Yes   TIMED UP AND GO:  Was the test performed? Yes .  Length of time to ambulate 10 feet: 6 sec.   Gait steady and fast without use of assistive device  Cognitive Function:        06/12/2022    9:11 AM  6CIT Screen  What Year? 0 points  What month? 0 points  What time? 0 points  Count back from 20 0 points  Months in reverse 0 points  Repeat phrase 0 points  Total Score 0 points    Immunizations Immunization History  Administered Date(s) Administered   Fluad Quad(high Dose 65+) 09/12/2019, 10/11/2020   Influenza Split 10/13/2014   Influenza Whole 09/16/2000   Influenza, High Dose Seasonal PF 12/03/2016, 11/21/2017   Influenza-Unspecified 10/06/2021   PFIZER(Purple Top)SARS-COV-2 Vaccination 01/06/2020, 01/27/2020, 09/19/2020   Pneumococcal Conjugate-13 04/16/2017   Pneumococcal Polysaccharide-23 05/22/2018   Zoster Recombinat (Shingrix) 09/12/2019, 11/14/2019   Zoster, Live 10/13/2014    TDAP status: Due, Education has been provided regarding the importance of this vaccine. Advised may receive this vaccine at local pharmacy or Health Dept. Aware to provide a copy of the vaccination record if obtained from local pharmacy or Health  Dept. Verbalized acceptance and understanding.  Flu Vaccine status: Up to date  Pneumococcal vaccine status: Up to date  Covid-19 vaccine status: Completed vaccines  Qualifies for Shingles Vaccine? Yes   Zostavax completed Yes   Shingrix Completed?: Yes  Screening Tests Health Maintenance  Topic Date Due   TETANUS/TDAP  Never done   FOOT EXAM  05/18/2020   COVID-19 Vaccine (4 - Pfizer series) 11/14/2020   OPHTHALMOLOGY EXAM  01/11/2021   INFLUENZA VACCINE  07/17/2022   HEMOGLOBIN A1C  09/25/2022   COLONOSCOPY (Pts 45-64yrs Insurance coverage will need to be confirmed)  01/18/2023   Pneumonia Vaccine 66+ Years old  Completed   Hepatitis C Screening  Completed   Zoster Vaccines- Shingrix  Completed   HPV VACCINES  Aged Out    Health Maintenance  Health Maintenance Due  Topic Date Due   TETANUS/TDAP  Never done   FOOT EXAM  05/18/2020   COVID-19 Vaccine (4 - Pfizer series) 11/14/2020   OPHTHALMOLOGY EXAM  01/11/2021    Colorectal cancer screening: Type of screening: Colonoscopy. Completed 01/19/2020. Repeat every 3 years  Lung Cancer Screening: (Low Dose CT Chest recommended if Age 40-80 years, 30 pack-year currently smoking OR have quit w/in 15years.) does not qualify.   Lung Cancer Screening Referral: no  Additional Screening:  Hepatitis C Screening: does qualify; Completed 12/30/2015  Vision Screening: Recommended annual ophthalmology exams for early detection of glaucoma and other disorders of the eye. Is the patient up to date with their annual eye exam?  Yes  Who is the provider or what is the name of the office in which the patient attends annual eye exams? [facility] If pt is not established with a provider, would they like to be referred to a provider to establish care? No .   Dental Screening: Recommended annual dental exams for proper oral hygiene  Community Resource Referral / Chronic Care Management: CRR required this visit?  No   CCM  required this visit?  No      Plan:     I have personally reviewed and noted the following in the patient's chart:   Medical and social history Use of alcohol, tobacco or illicit drugs  Current medications and supplements including opioid prescriptions. Patient is not currently taking opioid prescriptions. Functional ability and status Nutritional status Physical activity Advanced directives List of other physicians Hospitalizations, surgeries, and ER visits in previous 12 months Vitals Screenings to include cognitive, depression, and falls Referrals and appointments  In addition, I have reviewed and discussed with patient certain preventive protocols, quality metrics, and best practice recommendations. A written personalized care plan for preventive services as well as general preventive health recommendations were provided to patient.     Mickeal Needy, LPN   1/61/0960   Nurse Notes:  Hearing Screening - Comments:: Patient denied any hearing difficulty.   No hearing aids.  Vision Screening - Comments:: Patient does wear corrective lenses/contacts.  Eye exam done by: Nelson Chimes, MD.

## 2022-06-26 ENCOUNTER — Other Ambulatory Visit: Payer: Self-pay | Admitting: *Deleted

## 2022-06-26 DIAGNOSIS — E785 Hyperlipidemia, unspecified: Secondary | ICD-10-CM

## 2022-06-30 ENCOUNTER — Encounter: Payer: Self-pay | Admitting: Cardiology

## 2022-07-01 ENCOUNTER — Other Ambulatory Visit: Payer: Self-pay | Admitting: Internal Medicine

## 2022-07-01 DIAGNOSIS — I10 Essential (primary) hypertension: Secondary | ICD-10-CM

## 2022-07-01 DIAGNOSIS — I25110 Atherosclerotic heart disease of native coronary artery with unstable angina pectoris: Secondary | ICD-10-CM

## 2022-07-01 DIAGNOSIS — E782 Mixed hyperlipidemia: Secondary | ICD-10-CM

## 2022-07-03 ENCOUNTER — Encounter: Payer: Self-pay | Admitting: Pharmacist Clinician (PhC)/ Clinical Pharmacy Specialist

## 2022-07-03 ENCOUNTER — Ambulatory Visit: Payer: PPO | Admitting: Pharmacist Clinician (PhC)/ Clinical Pharmacy Specialist

## 2022-07-03 DIAGNOSIS — I10 Essential (primary) hypertension: Secondary | ICD-10-CM

## 2022-07-03 NOTE — Assessment & Plan Note (Signed)
Patient with essential hypertension, still elevated in the office today, although was 122/60 at recent MD visit.  Have asked that he stop in the office on Thursday with his home meter so we can determine its accuracy.  For now, no changes in medication and will continue to monitor.

## 2022-07-03 NOTE — Progress Notes (Signed)
07/03/2022 Alex Bryant October 26, 1951 [id number]   HPI:  Alex Bryant is a 71 y.o. male patient of Dr Gardiner Rhyme, with a PMH below who presents today for hypertension clinic evaluation.  He was seen just a few weeks ago, and at that time his BP was noted to be 162/80.  Dr. Gardiner Rhyme increased his ramipril from 5 mg bid to 10 mg bid.  He was asked to monitor readings at home and return in 2 weeks with that information, for follow up.  At that time he was started on amlodipine 5 mg daily, with the option to cut back or discontinue should he no longer need chronic prednisone.    Today he returns for follow up.  He has been doing well and has no complaints about the addition of amlodipine.  He did buy a new BP cuff, but is unsure of it's accuracy.  States it mostly reads 865'H systolic, but then at MD was 122/60. Did eat Chinese take out yesterday.    Past Medical History: CAD 2019 CABG x 5; on plavix, rosuvastatin, metoprolol  HLD 4/23 LDL 148, restarted rosuvastatin 04/16/22, goal < 55  CVA 2017 - recovered w/o residual defects,   DM2 4/23  A1c 7.2 on metformin     Blood Pressure Goal:  130/80  Current Medications: ramipril 10 mg bid, metoprolol tart 25 mg bid, amlodipine 5 mg qd  Family Hx: mother had hypertension later in life, died at 35; father with CABG at 66, died at 74 from accident; no heart issues in siblings, sister is diabetic; 3 kids, no hypertension  Social Hx: quit tobacco 1982, social weekend drinking; decaf coffee, only occasional soda  Diet: mix of home and restaurant;  eating out is mostly sit-down, only occasional fried foods; not enough vegetables although does like salad  Exercise: walks 8,000-12,000 steps per day  Home BP readings: no home readings, meter no longer working  Intolerances: no cardiac medication intolerances  Labs: 4/23:  Na 139, K 3.9, Glu 239, BUN 19, SCr 1.14, GFR 64.3   Wt Readings from Last 3 Encounters:  06/12/22 204 lb 3.2 oz (92.6 kg)   05/02/22 205 lb (93 kg)  04/16/22 211 lb 12.8 oz (96.1 kg)   BP Readings from Last 3 Encounters:  07/03/22 (!) 149/74  06/12/22 122/60  05/02/22 (!) 154/86   Pulse Readings from Last 3 Encounters:  07/03/22 (!) 52  06/12/22 (!) 55  05/02/22 62    Current Outpatient Medications  Medication Sig Dispense Refill   amLODipine (NORVASC) 5 MG tablet Take 1 tablet (5 mg total) by mouth daily. 90 tablet 3   clopidogrel (PLAVIX) 75 MG tablet TAKE 1 TABLET(75 MG) BY MOUTH DAILY 90 tablet 1   glucose blood test strip 1 each by Other route as needed. Use as instructed      Lancets (FREESTYLE) lancets 1 each by Other route 2 (two) times daily. Use to help check blood sugar twice a day. Dx E11.9 100 each 3   metFORMIN (GLUCOPHAGE) 1000 MG tablet TAKE 1 TABLET(1000 MG) BY MOUTH TWICE DAILY WITH A MEAL 180 tablet 1   metoprolol tartrate (LOPRESSOR) 25 MG tablet TAKE 1 TABLET BY MOUTH TWICE DAILY 180 tablet 1   ramipril (ALTACE) 10 MG capsule Take 1 capsule (10 mg total) by mouth 2 (two) times daily. 180 capsule 3   rosuvastatin (CRESTOR) 20 MG tablet Take 1 tablet (20 mg total) by mouth daily. 90 tablet 3   zolpidem (  AMBIEN) 5 MG tablet TAKE 1 TABLET(5 MG) BY MOUTH AT BEDTIME AS NEEDED 30 tablet 5   No current facility-administered medications for this visit.    Allergies  Allergen Reactions   Oxycodone Anxiety    [name], confused, and constipation    Past Medical History:  Diagnosis Date   Allergy    Coronary artery disease    Diet-controlled diabetes mellitus ([place])    AODM   History of gout    Hyperlipidemia    Hypertension    Progressive angina ([place]) 08/12/2018   Chest pain   Stroke ([place]) 11/19/2016   "fully recovered" (08/14/2018)   Tick bite 0867   Erlichiosis;Eulis Canner MD, ID    Blood pressure (!) 149/74, pulse (!) 52.  Essential hypertension Patient with essential hypertension, still elevated in the office today, although was 122/60 at recent MD visit.  Have asked that he  stop in the office on Thursday with his home meter so we can determine its accuracy.  For now, no changes in medication and will continue to monitor.      Tommy Medal PharmD CPP [facility] [address] [place], [place] [address] [phone]

## 2022-07-03 NOTE — Patient Instructions (Signed)
Return for a a follow up appointment Thursday at 8:30 - bring you BP cuff to have it checked.  Take your BP meds as follows:  Continue with your current medications  Bring all of your meds, your BP cuff and your record of home blood pressures to your next appointment.  Exercise as you're able, try to walk approximately 30 minutes per day.  Keep salt intake to a minimum, especially watch canned and prepared boxed foods.  Eat more fresh fruits and vegetables and fewer canned items.  Avoid eating in fast food restaurants.    HOW TO TAKE YOUR BLOOD PRESSURE: Rest 5 minutes before taking your blood pressure.  Don't smoke or drink caffeinated beverages for at least 30 minutes before. Take your blood pressure before (not after) you eat. Sit comfortably with your back supported and both feet on the floor (don't cross your legs). Elevate your arm to heart level on a table or a desk. Use the proper sized cuff. It should fit smoothly and snugly around your bare upper arm. There should be enough room to slip a fingertip under the cuff. The bottom edge of the cuff should be 1 inch above the crease of the elbow. Ideally, take 3 measurements at one sitting and record the average.

## 2022-07-05 ENCOUNTER — Ambulatory Visit: Payer: PPO | Admitting: Pharmacist Clinician (PhC)/ Clinical Pharmacy Specialist

## 2022-08-21 DIAGNOSIS — M545 Low back pain, unspecified: Secondary | ICD-10-CM | POA: Diagnosis not present

## 2022-08-28 ENCOUNTER — Ambulatory Visit: Payer: Self-pay | Admitting: Licensed Clinical Social Worker

## 2022-08-28 NOTE — Patient Outreach (Signed)
  Care Coordination   Initial Visit Note   08/28/2022 Name: Alex Bryant MRN: [id number] DOB: 14-Sep-1951  Alex Bryant is a 71 y.o. year old male who sees Burns, Claudina Lick, MD for primary care. I spoke with  Alex Bryant by phone today.  What matters to the patients health and wellness today?    Unable to talk or schedule a phone appointment at this time  Goals Addressed             This Visit's Progress    Care Coordination Services       Care Coordination Interventions: Reviewed Care Coordination Services:Requested a call back           SDOH assessments and interventions completed:  No    Care Coordination Interventions Activated:  Yes  Care Coordination Interventions:  Yes, provided   Follow up plan:  Will call back     Encounter Outcome:  Pt. Request to Call [place], [place] [phone]

## 2022-08-28 NOTE — Patient Instructions (Signed)
     It was a pleasure speaking with you today. Per your request I will call back at a later date  Casimer Lanius, [place] [phone]

## 2022-08-30 DIAGNOSIS — M545 Low back pain, unspecified: Secondary | ICD-10-CM | POA: Diagnosis not present

## 2022-08-30 DIAGNOSIS — M546 Pain in thoracic spine: Secondary | ICD-10-CM | POA: Diagnosis not present

## 2022-09-03 DIAGNOSIS — M791 Myalgia, unspecified site: Secondary | ICD-10-CM | POA: Diagnosis not present

## 2022-09-03 DIAGNOSIS — M546 Pain in thoracic spine: Secondary | ICD-10-CM | POA: Diagnosis not present

## 2022-09-03 DIAGNOSIS — M545 Low back pain, unspecified: Secondary | ICD-10-CM | POA: Diagnosis not present

## 2022-09-04 ENCOUNTER — Ambulatory Visit: Payer: Self-pay | Admitting: Licensed Clinical Social Worker

## 2022-09-04 NOTE — Patient Outreach (Signed)
  Care Coordination   Initial Visit Note   09/04/2022 Name: GERELL FORTSON MRN: [id number] DOB: 08-21-1951  [name] is a 71 y.o. year old male who sees Burns, Claudina Lick, MD for primary care. I spoke with  [name] by phone today.  What matters to the patients health and wellness today?   Patient reports no concerns or needs from Care Coordination team with health and wellness related to physical or mental heath. .    Goals Addressed             This Visit's Progress    COMPLETED: Care Coordination Services No Follow up Required       Care Coordination Interventions: Reviewed Care Coordination Services:Declined Medicare Annual Wellness Visit: completed 06/12/22             SDOH assessments and interventions completed:  No    Care Coordination Interventions Activated:  Yes  Care Coordination Interventions:  Yes, provided   Follow up plan: No further intervention required.   Encounter Outcome:  Pt. Visit Completed   Casimer Lanius, [place] [phone]

## 2022-09-04 NOTE — Patient Instructions (Signed)
Visit Information  Thank you for taking time to visit with me today. Please don't hesitate to contact me if I can be of assistance to you.   Following are the goals we discussed today:   Goals Addressed             This Visit's Progress    COMPLETED: Care Coordination Services No Follow up Required       Care Coordination Interventions: Reviewed Care Coordination Services:Declined Medicare Annual Wellness Visit: completed 06/12/22            Care Coordination team works in collaboration with your primary care doctor.  Please call [phone] if you would like to schedule a phone appointment with a Nurse or Social work Care Coordinator to assist with navigating your physical and mental health needs.     Patient verbalizes understanding of instructions and care plan provided today and agrees to view in [place]. Active MyChart status and patient understanding of how to access instructions and care plan via MyChart confirmed with patient.     No further follow up required: by Care Coordination at this time  Casimer Lanius, [place] [phone]

## 2022-09-29 ENCOUNTER — Other Ambulatory Visit: Payer: Self-pay | Admitting: Internal Medicine

## 2022-10-19 ENCOUNTER — Other Ambulatory Visit: Payer: Self-pay | Admitting: Internal Medicine

## 2022-10-20 ENCOUNTER — Other Ambulatory Visit: Payer: Self-pay | Admitting: Internal Medicine

## 2022-10-21 NOTE — Progress Notes (Unsigned)
Cardiology Office Note:    Date:  04/16/2022   ID:  Alex Bryant, DOB 01/30/1951, MRN [id number]  PCP:  Binnie Rail, MD  Cardiologist:  Quay Burow, MD  Electrophysiologist:  None   Referring MD: Binnie Rail, MD   Chief Complaint  Patient presents with   Coronary Artery Disease    History of Present Illness:    Alex Bryant is a 71 y.o. male with a hx of CAD status post CABG 08/19/2018 (LIMA to LAD, SVG-ramus, SVG-OM1 and OM 2, SVG-PDA), T2DM, hypertension, hyperlipidemia, CVA who presents for follow-up.  He was referred by Dr. Quay Burow for evaluation of CAD, initially seen 04/16/2022.  Previously followed with Dr. Gwenlyn Found, last seen in 2020.  He reports that he has been doing well.  Denies any chest pain, dyspnea, lightheadedness, syncope, lower extremity edema, or palpitations.  Walks 8000-10,000 steps per day.  Denies any exertional symptoms.  Taking Plavix, denies any bleeding issues.  Since last clinic visit, ***Echo, lipid panel   BP Readings from Last 3 Encounters:  04/16/22 (!) 162/80  03/26/22 132/64  08/24/21 (!) 156/88      Past Medical History:  Diagnosis Date   Allergy    Coronary artery disease    Diet-controlled diabetes mellitus ([place])    AODM   History of gout    Hyperlipidemia    Hypertension    Progressive angina ([place]) 08/12/2018   Chest pain   Stroke ([place]) 11/19/2016   "fully recovered" (08/14/2018)   Tick bite 0600   Erlichiosis;Eulis Canner MD, ID    Past Surgical History:  Procedure Laterality Date   BACK SURGERY     COLONOSCOPY     COLONOSCOPY W/ POLYPECTOMY  2010; 2015   Dr Olevia Perches   CORONARY ARTERY BYPASS GRAFT N/A 08/19/2018   Procedure: CORONARY ARTERY BYPASS GRAFTING (CABG) x 5  USING LEFT INTERNAL MAMMARY ARTERY AND ENDOSCOPICALLY HARVESTED RIGHT SAPHENOUS VEIN.;  Surgeon: Ivin Poot, MD;  Location: [facility];  Service: Open Heart Surgery;  Laterality: N/A;   EP IMPLANTABLE DEVICE N/A 11/21/2016   Procedure: Loop Recorder Insertion;   Surgeon: Thompson Grayer, MD;  Location: [facility];  Service: Cardiovascular;  Laterality: N/A;   ESI  2014   '@L5'$ -S1 X3    EXCISIONAL HEMORRHOIDECTOMY  ~ [place] Right 1990s   plating & pinning of maxilla   INGUINAL HERNIA REPAIR Bilateral 1959   LEFT HEART CATH AND CORONARY ANGIOGRAPHY N/A 08/14/2018   Procedure: LEFT HEART CATH AND CORONARY ANGIOGRAPHY;  Surgeon: Lorretta Harp, MD;  Location: [facility];  Service: Cardiovascular;  Laterality: N/A;   POLYPECTOMY     POSTERIOR LUMBAR FUSION  10/17/2013   L5-S1 ; [place]   TEE WITHOUT CARDIOVERSION N/A 11/21/2016   Procedure: TRANSESOPHAGEAL ECHOCARDIOGRAM (TEE);  Surgeon: Larey Dresser, MD;  Location: [facility];  Service: Cardiovascular;  Laterality: N/A;   TEE WITHOUT CARDIOVERSION N/A 08/19/2018   Procedure: TRANSESOPHAGEAL ECHOCARDIOGRAM (TEE);  Surgeon: Prescott Gum, Collier Salina, MD;  Location: [facility];  Service: Open Heart Surgery;  Laterality: N/A;    Current Medications: Current Meds  Medication Sig   clopidogrel (PLAVIX) 75 MG tablet TAKE 1 TABLET(75 MG) BY MOUTH DAILY   glucose blood test strip 1 each by Other route as needed. Use as instructed    Lancets (FREESTYLE) lancets 1 each by Other route 2 (two) times daily. Use to help check blood sugar twice a day.  Dx E11.9   metFORMIN (GLUCOPHAGE) 1000 MG tablet TAKE 1 TABLET(1000 MG) BY MOUTH TWICE DAILY WITH A MEAL   metoprolol tartrate (LOPRESSOR) 25 MG tablet TAKE 1 TABLET BY MOUTH TWICE DAILY   Omega-3 Fatty Acids (FISH OIL) 1000 MG CAPS Take 1 capsule (1,000 mg total) by mouth daily.   zolpidem (AMBIEN) 5 MG tablet Take 1 tablet (5 mg total) by mouth at bedtime as needed.   [DISCONTINUED] ramipril (ALTACE) 5 MG capsule TAKE 2 CAPSULES(10 MG) BY MOUTH DAILY   [DISCONTINUED] rosuvastatin (CRESTOR) 20 MG tablet Take 1 tablet (20 mg total) by mouth daily. Please make overdue appt with Dr. Gwenlyn Found before anymore refills. Thank you 3rd  and Final Attempt     Allergies:   Oxycodone   Social History   Socioeconomic History   Marital status: Married    Spouse name: Not on file   Number of children: 3   Years of education: Not on file   Highest education level: Master's degree (e.g., MA, MS, MEng, MEd, MSW, MBA)  Occupational History   Not on file  Tobacco Use   Smoking status: Former    Packs/day: 0.25    Years: 6.00    Pack years: 1.50    Types: Cigarettes    Quit date: 12/17/1980    Years since quitting: 41.3   Smokeless tobacco: Never   Tobacco comments:    smoked off and on 1966- 1982, up to < 5 cigarettes / day  Vaping Use   Vaping Use: Never used  Substance and Sexual Activity   Alcohol use: Yes    Alcohol/week: 4.0 standard drinks    Types: 4 Glasses of wine per week    Comment:  socially   Drug use: Never   Sexual activity: Yes  Other Topics Concern   Not on file  Social History Narrative   Lives at home with wife    Right handed   Caffeine: decaf coffee   Social Determinants of Health   Financial Resource Strain: Not on file  Food Insecurity: Not on file  Transportation Needs: Not on file  Physical Activity: Not on file  Stress: Not on file  Social Connections: Not on file     Family History: The patient's family history includes Diabetes in his sister; Heart attack (age of onset: 24) in his father; Hypertension in his mother; Stroke in his maternal uncle and mother. There is no history of Cancer, Colon cancer, Rectal cancer, Stomach cancer, Colon polyps, Esophageal cancer, Headache, or Migraines.  ROS:   Please see the history of present illness.     All other systems reviewed and are negative.  EKGs/Labs/Other Studies Reviewed:    The following studies were reviewed today:   EKG:   04/16/2022: Sinus rhythm, rate 63, PACs  Recent Labs: 03/26/2022: ALT 15; BUN 19; Creatinine, Ser 1.14; Hemoglobin 13.7; Platelets 247.0; Potassium 3.9; Sodium 139  Recent Lipid Panel    Component  Value Date/Time   CHOL 219 (H) 03/26/2022 1530   CHOL 130 12/18/2018 0907   CHOL 153 05/25/2015 0924   TRIG 380.0 (H) 03/26/2022 1530   TRIG 188 (H) 05/25/2015 0924   HDL 47.70 03/26/2022 1530   HDL 52 12/18/2018 0907   HDL 38 (L) 05/25/2015 0924   CHOLHDL 5 03/26/2022 1530   VLDL 76.0 (H) 03/26/2022 1530   LDLCALC 52 10/11/2020 1051   LDLCALC 54 12/18/2018 0907   LDLCALC 77 05/25/2015 0924   LDLDIRECT 148.0 03/26/2022 1530  Physical Exam:    VS:  BP (!) 162/80 (BP Location: Right Arm, Patient Position: Sitting, Cuff Size: Normal)   Pulse 63   Ht 6' (1.829 m)   Wt 211 lb 12.8 oz (96.1 kg)   SpO2 96%   BMI 28.73 kg/m     Wt Readings from Last 3 Encounters:  04/16/22 211 lb 12.8 oz (96.1 kg)  03/26/22 214 lb 8 oz (97.3 kg)  08/24/21 204 lb (92.5 kg)     GEN:  Well nourished, well developed in no acute distress HEENT: Normal NECK: No JVD; No carotid bruits LYMPHATICS: No lymphadenopathy CARDIAC: RRR, no murmurs, rubs, gallops RESPIRATORY:  Clear to auscultation without rales, wheezing or rhonchi  ABDOMEN: Soft, non-tender, non-distended MUSCULOSKELETAL:  No edema; No deformity  SKIN: Warm and dry NEUROLOGIC:  Alert and oriented x 3 PSYCHIATRIC:  Normal affect   ASSESSMENT:    1. Coronary artery disease involving native coronary artery of native heart without angina pectoris   2. Essential hypertension   3. Hyperlipidemia, unspecified hyperlipidemia type   4. Cerebrovascular accident (CVA), unspecified mechanism ([place])    PLAN:     CAD: status post CABG 08/19/2018 (LIMA to LAD, SVG-ramus, SVG-OM1 and OM 2, SVG-PDA) -Continue Plavix 75 mg daily -At prior clinic visit 04/16/2022, he had been off statin x3 months.  Restarted rosuvastatin 5 mg daily.  Check lipid panel -Continue metoprolol 25 mg twice daily  Hypertension: On metoprolol 25 mg twice daily and ramipril 10 mg twice daily  Hyperlipidemia: LDL 148 on 03/26/2022, had been off rosuvastatin.  Restarted  rosuvastatin and will check lipid panel  CVA: Continue Plavix, statin  T2DM: On metformin.  A1c 7.2%.   RTC in 6 months    Medication Adjustments/Labs and Tests Ordered: Current medicines are reviewed at length with the patient today.  Concerns regarding medicines are outlined above.  Orders Placed This Encounter  Procedures   Basic metabolic panel   EKG 88-FOYD   Meds ordered this encounter  Medications   ramipril (ALTACE) 10 MG capsule    Sig: Take 1 capsule (10 mg total) by mouth 2 (two) times daily.    Dispense:  180 capsule    Refill:  3    Dose increase   rosuvastatin (CRESTOR) 20 MG tablet    Sig: Take 1 tablet (20 mg total) by mouth daily.    Dispense:  90 tablet    Refill:  3    There are no Patient Instructions on file for this visit.   Signed, Donato Heinz, MD  04/16/2022 2:07 PM    [place]

## 2022-10-22 ENCOUNTER — Ambulatory Visit: Payer: PPO | Attending: Cardiology | Admitting: Cardiology

## 2022-10-22 ENCOUNTER — Encounter: Payer: Self-pay | Admitting: Cardiology

## 2022-10-22 VITALS — BP 146/76 | HR 52 | Ht 72.0 in | Wt 204.6 lb

## 2022-10-22 DIAGNOSIS — I10 Essential (primary) hypertension: Secondary | ICD-10-CM | POA: Diagnosis not present

## 2022-10-22 DIAGNOSIS — I2510 Atherosclerotic heart disease of native coronary artery without angina pectoris: Secondary | ICD-10-CM

## 2022-10-22 DIAGNOSIS — E785 Hyperlipidemia, unspecified: Secondary | ICD-10-CM

## 2022-10-22 MED ORDER — AMLODIPINE BESYLATE 10 MG PO TABS: 10.0000 mg | ORAL_TABLET | Freq: Every day | ORAL | 3 refills | Status: DC

## 2022-10-22 MED ORDER — METOPROLOL SUCCINATE ER 25 MG PO TB24: 25.0000 mg | ORAL_TABLET | Freq: Every day | ORAL | 3 refills | Status: DC

## 2022-10-22 NOTE — Patient Instructions (Addendum)
Medication Instructions:  STOP Metoprolol Tartrate (Lopressor)  START Metoprolol Succinate (Toprol-XL) 25 mg daily  INCREASE Amlodipine to 10 mg daily   *If you need a refill on your cardiac medications before your next appointment, please call your pharmacy*   Lab Work: Your physician recommends that you return for lab work TODAY:  Fasting Lipid Panel  If you have labs (blood work) drawn today and your tests are completely normal, you will receive your results only by: MyChart Message (if you have MyChart) OR A paper copy in the mail If you have any lab test that is abnormal or we need to change your treatment, we will call you to review the results.   Testing/Procedures: Your physician has requested that you have an echocardiogram. Echocardiography is a painless test that uses sound waves to create images of your heart. It provides your doctor with information about the size and shape of your heart and how well your heart's chambers and valves are working. This procedure takes approximately one hour. There are no restrictions for this procedure. Please do NOT wear cologne, perfume, aftershave, or lotions (deodorant is allowed). Please arrive 15 minutes prior to your appointment time.    Follow-Up: At [facility], you and your health needs are our priority.  As part of our continuing mission to provide you with exceptional heart care, we have created designated Provider Care Teams.  These Care Teams include your primary Cardiologist (physician) and Advanced Practice Providers (APPs -  Physician Assistants and Nurse Practitioners) who all work together to provide you with the care you need, when you need it.  Your next appointment:   6 month(s)  The format for your next appointment:   In Person  Provider:   Dr. Gardiner Rhyme    Other Instructions Monitor blood pressure at home daily for 2 weeks.   Important Information About Sugar

## 2022-10-24 LAB — LIPID PANEL
Chol/HDL Ratio: 2.6 ratio (ref 0.0–5.0)
Cholesterol, Total: 137 mg/dL (ref 100–199)
HDL: 52 mg/dL (ref >39)
LDL Chol Calc (NIH): 64 mg/dL (ref 0–99)
Triglycerides: 118 mg/dL (ref 0–149)
VLDL Cholesterol Cal: 21 mg/dL (ref 5–40)

## 2022-10-31 DIAGNOSIS — J019 Acute sinusitis, unspecified: Secondary | ICD-10-CM | POA: Diagnosis not present

## 2022-10-31 DIAGNOSIS — J069 Acute upper respiratory infection, unspecified: Secondary | ICD-10-CM | POA: Diagnosis not present

## 2022-10-31 DIAGNOSIS — J209 Acute bronchitis, unspecified: Secondary | ICD-10-CM | POA: Diagnosis not present

## 2022-11-14 ENCOUNTER — Encounter: Payer: Self-pay | Admitting: Cardiology

## 2022-11-14 ENCOUNTER — Ambulatory Visit (HOSPITAL_COMMUNITY): Payer: PPO | Attending: Cardiology

## 2022-11-14 DIAGNOSIS — I2510 Atherosclerotic heart disease of native coronary artery without angina pectoris: Secondary | ICD-10-CM | POA: Insufficient documentation

## 2022-11-14 DIAGNOSIS — I10 Essential (primary) hypertension: Secondary | ICD-10-CM

## 2022-11-14 LAB — ECHOCARDIOGRAM COMPLETE
Area-P 1/2: 2.95 cm2
S' Lateral: 3.1 cm

## 2022-11-15 NOTE — Telephone Encounter (Signed)
Recommend scheduling in pharmacy hypertension clinic.  Would bring home log and home BP monitor to calibrate

## 2022-11-19 NOTE — Progress Notes (Unsigned)
11/20/2022 Alex Bryant February 09, 1951 [id number]   HPI:  Alex Bryant is a 71 y.o. male patient of Dr Gardiner Rhyme, with a PMH below who presents today for hypertension clinic evaluation.  He was seen just a few weeks ago, and at that time his BP was noted to be 162/80.  Dr. Gardiner Rhyme increased his ramipril from 5 mg bid to 10 mg bid.  He was asked to monitor readings at home and return in 2 weeks with that information, for follow up.  At that time he was started on amlodipine 5 mg daily, with the option to cut back or discontinue should he no longer need chronic prednisone.    Today he returns for follow up.  He has been doing well and has no complaints about the addition of amlodipine.  He did buy a new BP cuff, but is unsure of it's accuracy.  States it mostly reads 332'R systolic, but then at [facility] was 122/60. Did eat Chinese take out yesterday.    Most recently seen by Dr. Gardiner Rhyme on Nov 6.  BP was elevated at 146/76, however HR was 52.  He decreased metoprolol from 25 mg bid (tartrate) to 25 mg daily (succinate) and increased amlodipine to 10 mg daily.  Today he returns, on his 71rd birthday, to determine accuracy of his home meter.    Past Medical History: CAD 2019 CABG x 5; on plavix, rosuvastatin, metoprolol  HLD 4/23 LDL 148, restarted rosuvastatin 04/16/22, goal < 55  CVA 2017 - recovered w/o residual defects,   DM2 4/23  A1c 7.2 on metformin    Blood Pressure Goal:  130/80  Current Medications: ramipril 10 mg bid, metoprolol succ 25 mg qd, amlodipine 10 mg qd  Family Hx: mother had hypertension later in life, died at 23; father with CABG at 72, died at 83 from accident; no heart issues in siblings, sister is diabetic; 3 kids, no hypertension  Social Hx: quit tobacco 1982, social weekend drinking; decaf coffee, only occasional soda  Diet: mix of home and restaurant;  eating out is mostly sit-down, only occasional fried foods; not enough vegetables although does like  salad  Exercise: walks 8,000-12,000 steps per day  Home BP readings: no home readings, meter no longer working  Low 140/80 high 176/88; mostly 518-841'Y , diastolic never to 90 Intolerances: no cardiac medication intolerances  Labs: 4/23:  Na 139, K 3.9, Glu 239, BUN 19, SCr 1.14, GFR 64.3   Wt Readings from Last 3 Encounters:  10/22/22 204 lb 9.6 oz (92.8 kg)  06/12/22 204 lb 3.2 oz (92.6 kg)  05/02/22 205 lb (93 kg)   BP Readings from Last 3 Encounters:  11/20/22 128/60  10/22/22 (!) 146/76  07/03/22 (!) 149/74   Pulse Readings from Last 3 Encounters:  11/20/22 66  10/22/22 (!) 52  07/03/22 (!) 52    Current Outpatient Medications  Medication Sig Dispense Refill   amLODipine (NORVASC) 10 MG tablet Take 1 tablet (10 mg total) by mouth daily. 90 tablet 3   clopidogrel (PLAVIX) 75 MG tablet TAKE 1 TABLET(75 MG) BY MOUTH DAILY 90 tablet 1   glucose blood test strip 1 each by Other route as needed. Use as instructed      Lancets (FREESTYLE) lancets 1 each by Other route 2 (two) times daily. Use to help check blood sugar twice a day. Dx E11.9 100 each 3   metFORMIN (GLUCOPHAGE) 1000 MG tablet TAKE 1 TABLET(1000 MG) BY MOUTH TWICE DAILY  WITH A MEAL 180 tablet 1   metoprolol succinate (TOPROL XL) 25 MG 24 hr tablet Take 1 tablet (25 mg total) by mouth daily. 90 tablet 3   ramipril (ALTACE) 10 MG capsule Take 1 capsule (10 mg total) by mouth 2 (two) times daily. 180 capsule 3   rosuvastatin (CRESTOR) 20 MG tablet Take 1 tablet (20 mg total) by mouth daily. 90 tablet 3   zolpidem (AMBIEN) 5 MG tablet TAKE 1 TABLET(5 MG) BY MOUTH AT BEDTIME AS NEEDED 30 tablet 5   No current facility-administered medications for this visit.    Allergies  Allergen Reactions   Oxycodone Anxiety    [name], confused, and constipation    Past Medical History:  Diagnosis Date   Allergy    Coronary artery disease    Diet-controlled diabetes mellitus ([place])    AODM   History of gout     Hyperlipidemia    Hypertension    Progressive angina ([place]) 08/12/2018   Chest pain   Stroke ([place]) 11/19/2016   "fully recovered" (08/14/2018)   Tick bite 5053   Erlichiosis;Eulis Canner MD, ID    Blood pressure 128/60, pulse 66.  Essential hypertension Patient with essential hypertension, recently purchased a new cuff for home use.  Device was compared to in office reading and read within 2/9 points of office reading.  Home readings have been somewhat higher, but patient has been using poor technique.  Reviewed proper technique in office today.  No changes to medications at this time, advised that he should continue with home monitoring and reach out should home readings continue to be elevated.  At this time I don't suspect masked hypertension, but rather poor technique.     Tommy Medal PharmD CPP [facility] [address] [place], [place] [address] [phone]

## 2022-11-20 ENCOUNTER — Encounter: Payer: Self-pay | Admitting: Pharmacist Clinician (PhC)/ Clinical Pharmacy Specialist

## 2022-11-20 ENCOUNTER — Ambulatory Visit: Payer: PPO | Attending: Cardiology | Admitting: Pharmacist Clinician (PhC)/ Clinical Pharmacy Specialist

## 2022-11-20 VITALS — BP 128/60 | HR 66

## 2022-11-20 DIAGNOSIS — I10 Essential (primary) hypertension: Secondary | ICD-10-CM

## 2022-11-20 DIAGNOSIS — J019 Acute sinusitis, unspecified: Secondary | ICD-10-CM | POA: Diagnosis not present

## 2022-11-20 DIAGNOSIS — J209 Acute bronchitis, unspecified: Secondary | ICD-10-CM | POA: Diagnosis not present

## 2022-11-20 NOTE — Assessment & Plan Note (Signed)
Patient with essential hypertension, recently purchased a new cuff for home use.  Device was compared to in office reading and read within 2/9 points of office reading.  Home readings have been somewhat higher, but patient has been using poor technique.  Reviewed proper technique in office today.  No changes to medications at this time, advised that he should continue with home monitoring and reach out should home readings continue to be elevated.  At this time I don't suspect masked hypertension, but rather poor technique.

## 2022-11-20 NOTE — Patient Instructions (Signed)
Check your blood pressure at home daily and keep record of the readings.  Take your BP meds as follows:  Continue with your current medications.  Bring all of your meds, your BP cuff and your record of home blood pressures to your next appointment.  Exercise as you're able, try to walk approximately 30 minutes per day.  Keep salt intake to a minimum, especially watch canned and prepared boxed foods.  Eat more fresh fruits and vegetables and fewer canned items.  Avoid eating in fast food restaurants.    HOW TO TAKE YOUR BLOOD PRESSURE: Rest 5 minutes before taking your blood pressure.  Don't smoke or drink caffeinated beverages for at least 30 minutes before. Take your blood pressure before (not after) you eat. Sit comfortably with your back supported and both feet on the floor (don't cross your legs). Elevate your arm to heart level on a table or a desk. Use the proper sized cuff. It should fit smoothly and snugly around your bare upper arm. There should be enough room to slip a fingertip under the cuff. The bottom edge of the cuff should be 1 inch above the crease of the elbow. Ideally, take 3 measurements at one sitting and record the average.

## 2022-12-14 ENCOUNTER — Other Ambulatory Visit: Payer: Self-pay | Admitting: Internal Medicine

## 2022-12-19 DIAGNOSIS — H524 Presbyopia: Secondary | ICD-10-CM | POA: Diagnosis not present

## 2022-12-19 DIAGNOSIS — H25013 Cortical age-related cataract, bilateral: Secondary | ICD-10-CM | POA: Diagnosis not present

## 2022-12-19 DIAGNOSIS — H2513 Age-related nuclear cataract, bilateral: Secondary | ICD-10-CM | POA: Diagnosis not present

## 2022-12-19 DIAGNOSIS — H5203 Hypermetropia, bilateral: Secondary | ICD-10-CM | POA: Diagnosis not present

## 2022-12-19 DIAGNOSIS — E119 Type 2 diabetes mellitus without complications: Secondary | ICD-10-CM | POA: Diagnosis not present

## 2022-12-19 LAB — HM DIABETES EYE EXAM

## 2022-12-24 ENCOUNTER — Telehealth: Payer: Self-pay | Admitting: Gastroenterology

## 2022-12-24 NOTE — Telephone Encounter (Signed)
Dr. Gardiner Rhyme,    This mutual patient is due for a surveillance colonoscopy due to polyps in 2021.  He was able to hold plavix 5 days prior to that procedure.  I reviewed your Nov 2023 office note, and it indicates this man is in good health overall, is active, and has no anginal symptoms.  I believe he can be directly booked for a colonoscopy if you are agreeable to him holding his plavix 5 days before.  We can even have him take aspirin during those 5 days if you prefer.  If you agree, then he can be booked without the trouble and expense of an office visit with [place].  Appreciate your time.  Wilfrid Lund, [facility]

## 2022-12-25 ENCOUNTER — Telehealth: Payer: Self-pay | Admitting: Cardiology

## 2022-12-25 MED ORDER — ASPIRIN 81 MG PO TBEC: 81.0000 mg | DELAYED_RELEASE_TABLET | Freq: Every day | ORAL | 3 refills | Status: AC

## 2022-12-25 NOTE — Telephone Encounter (Signed)
Spoke to patient, patient aware and verbalized understanding.   Med list updated.

## 2022-12-25 NOTE — Telephone Encounter (Signed)
Patient returned nurse Haley's call.

## 2022-12-25 NOTE — Telephone Encounter (Signed)
Excellent, thank you  - HD

## 2022-12-25 NOTE — Telephone Encounter (Signed)
Dr Loletha Carrow, Yes that should be fine to hold plavix for the procedure, but would recommend we switch him to baby aspirin.  Given his remote stroke and coronary intervention, would be fine to switch him to aspirin permanently at this point anyways.  Hayley, could you contact patient and let him know to stop Plavix and start aspirin 81 mg daily moving forward? Thanks, Gerald Stabs

## 2022-12-25 NOTE — Telephone Encounter (Signed)
Left message for patient to call back  

## 2022-12-25 NOTE — Telephone Encounter (Signed)
Duplicate message-see phone note

## 2022-12-31 ENCOUNTER — Encounter: Payer: Self-pay | Admitting: Gastroenterology

## 2023-01-03 ENCOUNTER — Encounter: Payer: Self-pay | Admitting: Internal Medicine

## 2023-01-03 NOTE — Progress Notes (Signed)
Outside notes received. Information abstracted. Notes sent to scan. 

## 2023-04-06 ENCOUNTER — Other Ambulatory Visit: Payer: Self-pay | Admitting: Cardiology

## 2023-04-10 ENCOUNTER — Other Ambulatory Visit: Payer: Self-pay | Admitting: Internal Medicine

## 2023-04-11 ENCOUNTER — Other Ambulatory Visit: Payer: Self-pay | Admitting: Cardiology

## 2023-04-21 ENCOUNTER — Other Ambulatory Visit: Payer: Self-pay | Admitting: Internal Medicine

## 2023-04-21 ENCOUNTER — Encounter: Payer: Self-pay | Admitting: Internal Medicine

## 2023-04-22 MED ORDER — ZOLPIDEM TARTRATE 5 MG PO TABS: ORAL_TABLET | ORAL | 0 refills | Status: DC

## 2023-04-22 NOTE — Progress Notes (Unsigned)
Cardiology Office Note:    Date:  04/24/2023   ID:  Alex Bryant, DOB [birth year], MRN [id number]  PCP:  Pincus Sanes, MD  Cardiologist:  Nanetta Batty, MD  Electrophysiologist:  None   Referring MD: Pincus Sanes, MD   Chief Complaint  Patient presents with   Coronary Artery Disease    History of Present Illness:    Alex Bryant is a 72 y.o. male with a hx of CAD status post CABG 08/19/2018 (LIMA to LAD, SVG-ramus, SVG-OM1 and OM 2, SVG-PDA), T2DM, hypertension, hyperlipidemia, CVA who presents for follow-up.  He was referred by Dr. Lawerance Bach for evaluation of CAD, initially seen 04/16/2022.  Previously followed with Dr. Allyson Sabal, last seen in 2020.  He reports that he has been doing well.  Denies any chest pain, dyspnea, lightheadedness, syncope, lower extremity edema, or palpitations.  Walks 8000-10,000 steps per day.  Denies any exertional symptoms.  Taking Plavix, denies any bleeding issues.  Echocardiogram 10/2022 showed normal biventricular function, moderate left atrial enlargement, mild right atrial enlargement, no significant valvular disease.  Since last clinic visit, he reports he is doing well.  Denies any chest pain, dyspnea, lightheadedness, syncope, or palpitations.  Does report some swelling in left leg.  He plays golf 1-2 times per week, walks 18 holes.  Denies any exertional symptoms.   BP Readings from Last 3 Encounters:  04/24/23 130/72  11/20/22 128/60  10/22/22 (!) 146/76      Past Medical History:  Diagnosis Date   Allergy    Coronary artery disease    Diet-controlled diabetes mellitus (HCC)    AODM   History of gout    Hyperlipidemia    Hypertension    Progressive angina (HCC) 08/12/2018   Chest pain   Stroke (HCC) 11/19/2016   "fully recovered" (08/14/2018)   Tick bite 2001   Erlichiosis;Darlina Sicilian MD, ID    Past Surgical History:  Procedure Laterality Date   BACK SURGERY     COLONOSCOPY     COLONOSCOPY W/ POLYPECTOMY  2010; 2015   Dr Juanda Chance    CORONARY ARTERY BYPASS GRAFT N/A 08/19/2018   Procedure: CORONARY ARTERY BYPASS GRAFTING (CABG) x 5  USING LEFT INTERNAL MAMMARY ARTERY AND ENDOSCOPICALLY HARVESTED RIGHT SAPHENOUS VEIN.;  Surgeon: Kerin Perna, MD;  Location: [facility] OR;  Service: Open Heart Surgery;  Laterality: N/A;   EP IMPLANTABLE DEVICE N/A 11/21/2016   Procedure: Loop Recorder Insertion;  Surgeon: Hillis Range, MD;  Location: [facility] INVASIVE CV LAB;  Service: Cardiovascular;  Laterality: N/A;   ESI  2014   @L5 -S1 X3    EXCISIONAL HEMORRHOIDECTOMY  ~ 1995   FACIAL RECONSTRUCTION SURGERY Right 1990s   plating & pinning of maxilla   INGUINAL HERNIA REPAIR Bilateral 1959   LEFT HEART CATH AND CORONARY ANGIOGRAPHY N/A 08/14/2018   Procedure: LEFT HEART CATH AND CORONARY ANGIOGRAPHY;  Surgeon: Runell Gess, MD;  Location: [facility] INVASIVE CV LAB;  Service: Cardiovascular;  Laterality: N/A;   POLYPECTOMY     POSTERIOR LUMBAR FUSION  10/17/2013   L5-S1 ; [facility] , [place]   TEE WITHOUT CARDIOVERSION N/A 11/21/2016   Procedure: TRANSESOPHAGEAL ECHOCARDIOGRAM (TEE);  Surgeon: Laurey Morale, MD;  Location: [facility] ENDOSCOPY;  Service: Cardiovascular;  Laterality: N/A;   TEE WITHOUT CARDIOVERSION N/A 08/19/2018   Procedure: TRANSESOPHAGEAL ECHOCARDIOGRAM (TEE);  Surgeon: Donata Clay, Theron Arista, MD;  Location: [facility] OR;  Service: Open Heart Surgery;  Laterality: N/A;    Current Medications: No outpatient medications have  been marked as taking for the 04/24/23 encounter (Office Visit) with Little Ishikawa, MD.     Allergies:   Oxycodone   Social History   Socioeconomic History   Marital status: Married    Spouse name: Not on file   Number of children: 3   Years of education: Not on file   Highest education level: Bachelor's degree (e.g., BA, AB, BS)  Occupational History   Not on file  Tobacco Use   Smoking status: Former    Packs/day: 0.25    Years: 6.00    Additional pack years: 0.00    Total pack years: 1.50    Types:  Cigarettes    Quit date: 12/17/1980    Years since quitting: 42.3   Smokeless tobacco: Never   Tobacco comments:    smoked off and on 1966- 1982, up to < 5 cigarettes / day  Vaping Use   Vaping Use: Never used  Substance and Sexual Activity   Alcohol use: Yes    Alcohol/week: 4.0 standard drinks of alcohol    Types: 4 Glasses of wine per week    Comment:  socially   Drug use: Never   Sexual activity: Yes  Other Topics Concern   Not on file  Social History Narrative   Lives at home with wife    Right handed   Caffeine: decaf coffee   Social Determinants of Health   Financial Resource Strain: Low Risk  (04/22/2023)   Overall Financial Resource Strain (CARDIA)    Difficulty of Paying Living Expenses: Not hard at all  Food Insecurity: No Food Insecurity (04/22/2023)   Hunger Vital Sign    Worried About Running Out of Food in the Last Year: Never true    Ran Out of Food in the Last Year: Never true  Transportation Needs: No Transportation Needs (04/22/2023)   PRAPARE - Administrator, Civil Service (Medical): No    Lack of Transportation (Non-Medical): No  Physical Activity: Sufficiently Active (04/22/2023)   Exercise Vital Sign    Days of Exercise per Week: 5 days    Minutes of Exercise per Session: 50 min  Stress: No Stress Concern Present (04/22/2023)   [employer] of Occupational Health - Occupational Stress Questionnaire    Feeling of Stress : Only a little  Social Connections: Socially Integrated (04/22/2023)   Social Connection and Isolation Panel [NHANES]    Frequency of Communication with Friends and Family: Patient declined    Frequency of Social Gatherings with Friends and Family: Three times a week    Attends Religious Services: More than 4 times per year    Active Member of Clubs or Organizations: Yes    Attends Engineer, structural: More than 4 times per year    Marital Status: Married     Family History: The patient's family history includes  Diabetes in his sister; Heart attack (age of onset: 11) in his father; Hypertension in his mother; Stroke in his maternal uncle and mother. There is no history of Cancer, Colon cancer, Rectal cancer, Stomach cancer, Colon polyps, Esophageal cancer, Headache, or Migraines.  ROS:   Please see the history of present illness.     All other systems reviewed and are negative.  EKGs/Labs/Other Studies Reviewed:    The following studies were reviewed today:   EKG:   04/16/2022: Sinus rhythm, rate 63, PACs 10/22/22: Sinus bradycardia, rate 52, first-degree AV block 04/24/23: Sinus bradycardia, first degree AV block, rate 57  Recent Labs: 05/02/2022: BUN 22; Creatinine, Ser 1.16; Potassium 4.7; Sodium 140  Recent Lipid Panel    Component Value Date/Time   CHOL 137 10/22/2022 1058   CHOL 153 05/25/2015 0924   TRIG 118 10/22/2022 1058   TRIG 188 (H) 05/25/2015 0924   HDL 52 10/22/2022 1058   HDL 38 (L) 05/25/2015 0924   CHOLHDL 2.6 10/22/2022 1058   CHOLHDL 5 03/26/2022 1530   VLDL 76.0 (H) 03/26/2022 1530   LDLCALC 64 10/22/2022 1058   LDLCALC 77 05/25/2015 0924   LDLDIRECT 148.0 03/26/2022 1530    Physical Exam:    VS:  BP 130/72   Pulse (!) 57   Ht 6' (1.829 m)   Wt 214 lb 6.4 oz (97.3 kg)   SpO2 95%   BMI 29.08 kg/m     Wt Readings from Last 3 Encounters:  04/24/23 214 lb 6.4 oz (97.3 kg)  10/22/22 204 lb 9.6 oz (92.8 kg)  06/12/22 204 lb 3.2 oz (92.6 kg)     GEN:  Well nourished, well developed in no acute distress HEENT: Normal NECK: No JVD; No carotid bruits LYMPHATICS: No lymphadenopathy CARDIAC: RRR, no murmurs, rubs, gallops RESPIRATORY:  Clear to auscultation without rales, wheezing or rhonchi  ABDOMEN: Soft, non-tender, non-distended MUSCULOSKELETAL:  1+ LLE edema; No deformity  SKIN: Warm and dry NEUROLOGIC:  Alert and oriented x 3 PSYCHIATRIC:  Normal affect   ASSESSMENT:    1. Coronary artery disease involving native coronary artery of native heart  without angina pectoris   2. Lower leg edema   3. Essential hypertension   4. Hyperlipidemia, unspecified hyperlipidemia type     PLAN:    CAD: status post CABG 08/19/2018 (LIMA to LAD, SVG-ramus, SVG-OM1 and OM 2, SVG-PDA).  Echocardiogram 10/2022 showed normal biventricular function, moderate left atrial enlargement, mild right atrial enlargement, no significant valvular disease. -Continue ASA 81 mg daily -Continue rosuvastatin 20 mg daily.  LDL 64 10/2022.  Check lipid panel -Continue toprol XL 25 mg daily  Hypertension: On metoprolol 25 mg daily and ramipril 10 mg twice daily and amlodipine 10 mg daily.  Appears controlled  Hyperlipidemia: LDL 148 on 03/26/2022, had been off rosuvastatin.  Restarted rosuvastatin 20 mg daily, LDL 64 10/2022.  She is having lipid panel checked with PCP today  Left lower extremity edema: Asymmetric 1+ left lower extremity edema.  Check left lower extremity duplex to rule out DVT.  Check BNP, CMET.  If workup unremarkable, may be due to amlodipine use  CVA: Continue ASA, statin  T2DM: On metformin.  A1c 7.2% 03/2022   RTC in 6 months    Medication Adjustments/Labs and Tests Ordered: Current medicines are reviewed at length with the patient today.  Concerns regarding medicines are outlined above.  Orders Placed This Encounter  Procedures   Brain natriuretic peptide   EKG 12-Lead   VAS [place] LOWER EXTREMITY VENOUS (DVT)   No orders of the defined types were placed in this encounter.   Patient Instructions  Medication Instructions:  Your physician recommends that you continue on your current medications as directed. Please refer to the Current Medication list given to you today.  *If you need a refill on your cardiac medications before your next appointment, please call your pharmacy*   Lab Work: BNP with labs being completed with primary care provider today  If you have labs (blood work) drawn today and your tests are completely normal, you  will receive your results only by: MyChart Message (if  you have MyChart) OR A paper copy in the mail If you have any lab test that is abnormal or we need to change your treatment, we will call you to review the results.   Testing/Procedures: LLE [place] to r/o DVT  Follow-Up: At [facility], you and your health needs are our priority.  As part of our continuing mission to provide you with exceptional heart care, we have created designated Provider Care Teams.  These Care Teams include your primary Cardiologist (physician) and Advanced Practice Providers (APPs -  Physician Assistants and Nurse Practitioners) who all work together to provide you with the care you need, when you need it.  We recommend signing up for the patient portal called "MyChart".  Sign up information is provided on this After Visit Summary.  MyChart is used to connect with patients for Virtual Visits (Telemedicine).  Patients are able to view lab/test results, encounter notes, upcoming appointments, etc.  Non-urgent messages can be sent to your provider as well.   To learn more about what you can do with MyChart, go to [email or web address].    Your next appointment:   6 month(s)  Provider:   Dr. Bjorn Pippin    Signed, Little Ishikawa, MD  04/24/2023 8:43 AM    [facility]

## 2023-04-24 ENCOUNTER — Ambulatory Visit (INDEPENDENT_AMBULATORY_CARE_PROVIDER_SITE_OTHER): Payer: PPO | Admitting: Internal Medicine

## 2023-04-24 ENCOUNTER — Ambulatory Visit: Payer: PPO | Attending: Cardiology | Admitting: Cardiology

## 2023-04-24 ENCOUNTER — Encounter: Payer: Self-pay | Admitting: Cardiology

## 2023-04-24 ENCOUNTER — Encounter: Payer: Self-pay | Admitting: Internal Medicine

## 2023-04-24 VITALS — BP 116/60 | HR 65 | Temp 98.3°F | Ht 72.0 in | Wt 215.0 lb

## 2023-04-24 VITALS — BP 130/72 | HR 57 | Ht 72.0 in | Wt 214.4 lb

## 2023-04-24 DIAGNOSIS — Z8673 Personal history of transient ischemic attack (TIA), and cerebral infarction without residual deficits: Secondary | ICD-10-CM | POA: Diagnosis not present

## 2023-04-24 DIAGNOSIS — Z7984 Long term (current) use of oral hypoglycemic drugs: Secondary | ICD-10-CM | POA: Diagnosis not present

## 2023-04-24 DIAGNOSIS — E782 Mixed hyperlipidemia: Secondary | ICD-10-CM | POA: Diagnosis not present

## 2023-04-24 DIAGNOSIS — I2510 Atherosclerotic heart disease of native coronary artery without angina pectoris: Secondary | ICD-10-CM

## 2023-04-24 DIAGNOSIS — I10 Essential (primary) hypertension: Secondary | ICD-10-CM

## 2023-04-24 DIAGNOSIS — E1159 Type 2 diabetes mellitus with other circulatory complications: Secondary | ICD-10-CM | POA: Diagnosis not present

## 2023-04-24 DIAGNOSIS — M7989 Other specified soft tissue disorders: Secondary | ICD-10-CM | POA: Diagnosis not present

## 2023-04-24 DIAGNOSIS — G479 Sleep disorder, unspecified: Secondary | ICD-10-CM | POA: Diagnosis not present

## 2023-04-24 DIAGNOSIS — E785 Hyperlipidemia, unspecified: Secondary | ICD-10-CM | POA: Diagnosis not present

## 2023-04-24 DIAGNOSIS — R600 Localized edema: Secondary | ICD-10-CM | POA: Diagnosis not present

## 2023-04-24 DIAGNOSIS — I25110 Atherosclerotic heart disease of native coronary artery with unstable angina pectoris: Secondary | ICD-10-CM | POA: Diagnosis not present

## 2023-04-24 LAB — COMPREHENSIVE METABOLIC PANEL
ALT: 17 U/L (ref 0–53)
AST: 14 U/L (ref 0–37)
Albumin: 4.3 g/dL (ref 3.5–5.2)
Alkaline Phosphatase: 72 U/L (ref 39–117)
BUN: 19 mg/dL (ref 6–23)
CO2: 27 mEq/L (ref 19–32)
Calcium: 9.4 mg/dL (ref 8.4–10.5)
Chloride: 105 mEq/L (ref 96–112)
Creatinine, Ser: 1.28 mg/dL (ref 0.40–1.50)
GFR: 55.56 mL/min — ABNORMAL LOW (ref >60.00)
Glucose, Bld: 173 mg/dL — ABNORMAL HIGH (ref 70–99)
Potassium: 4.7 mEq/L (ref 3.5–5.1)
Sodium: 140 mEq/L (ref 135–145)
Total Bilirubin: 0.6 mg/dL (ref 0.2–1.2)
Total Protein: 7.3 g/dL (ref 6.0–8.3)

## 2023-04-24 LAB — CBC WITH DIFFERENTIAL/PLATELET
Basophils Absolute: 0.1 10*3/uL (ref 0.0–0.1)
Basophils Relative: 1 % (ref 0.0–3.0)
Eosinophils Absolute: 0.5 10*3/uL (ref 0.0–0.7)
Eosinophils Relative: 6.7 % — ABNORMAL HIGH (ref 0.0–5.0)
HCT: 37.9 % — ABNORMAL LOW (ref 39.0–52.0)
Hemoglobin: 13.3 g/dL (ref 13.0–17.0)
Lymphocytes Relative: 21.9 % (ref 12.0–46.0)
Lymphs Abs: 1.6 10*3/uL (ref 0.7–4.0)
MCHC: 35.1 g/dL (ref 30.0–36.0)
MCV: 87.9 fl (ref 78.0–100.0)
Monocytes Absolute: 0.6 10*3/uL (ref 0.1–1.0)
Monocytes Relative: 8.2 % (ref 3.0–12.0)
Neutro Abs: 4.5 10*3/uL (ref 1.4–7.7)
Neutrophils Relative %: 62.2 % (ref 43.0–77.0)
Platelets: 253 10*3/uL (ref 150.0–400.0)
RBC: 4.31 Mil/uL (ref 4.22–5.81)
RDW: 14.3 % (ref 11.5–15.5)
WBC: 7.3 10*3/uL (ref 4.0–10.5)

## 2023-04-24 LAB — LIPID PANEL
Cholesterol: 120 mg/dL (ref 0–200)
HDL: 41.8 mg/dL (ref >39.00)
NonHDL: 77.7
Total CHOL/HDL Ratio: 3
Triglycerides: 256 mg/dL — ABNORMAL HIGH (ref 0.0–149.0)
VLDL: 51.2 mg/dL — ABNORMAL HIGH (ref 0.0–40.0)

## 2023-04-24 LAB — MICROALBUMIN / CREATININE URINE RATIO
Creatinine,U: 153.4 mg/dL
Microalb Creat Ratio: 2.5 mg/g (ref 0.0–30.0)
Microalb, Ur: 3.9 mg/dL — ABNORMAL HIGH (ref 0.0–1.9)

## 2023-04-24 LAB — HEMOGLOBIN A1C: Hgb A1c MFr Bld: 8 % — ABNORMAL HIGH (ref 4.6–6.5)

## 2023-04-24 LAB — BRAIN NATRIURETIC PEPTIDE: Pro B Natriuretic peptide (BNP): 82 pg/mL (ref 0.0–100.0)

## 2023-04-24 LAB — LDL CHOLESTEROL, DIRECT: Direct LDL: 56 mg/dL

## 2023-04-24 MED ORDER — METFORMIN HCL 1000 MG PO TABS: 1000.0000 mg | ORAL_TABLET | Freq: Two times a day (BID) | ORAL | 1 refills | Status: DC

## 2023-04-24 MED ORDER — ZOLPIDEM TARTRATE 5 MG PO TABS: ORAL_TABLET | ORAL | 5 refills | Status: DC

## 2023-04-24 NOTE — Patient Instructions (Addendum)
      Blood work was ordered.   The lab is on the first floor.    Medications changes include :   none     Return in about 6 months (around 10/25/2023) for Physical Exam.  

## 2023-04-24 NOTE — Assessment & Plan Note (Signed)
Chronic No residual deficits Blood pressure well controlled Check A1c  Continue ASA 81 mg daily, Crestor 20 mg daily

## 2023-04-24 NOTE — Patient Instructions (Signed)
Medication Instructions:  Your physician recommends that you continue on your current medications as directed. Please refer to the Current Medication list given to you today.  *If you need a refill on your cardiac medications before your next appointment, please call your pharmacy*   Lab Work: BNP with labs being completed with primary care provider today  If you have labs (blood work) drawn today and your tests are completely normal, you will receive your results only by: MyChart Message (if you have MyChart) OR A paper copy in the mail If you have any lab test that is abnormal or we need to change your treatment, we will call you to review the results.   Testing/Procedures: LLE [place] to r/o DVT  Follow-Up: At [facility], you and your health needs are our priority.  As part of our continuing mission to provide you with exceptional heart care, we have created designated Provider Care Teams.  These Care Teams include your primary Cardiologist (physician) and Advanced Practice Providers (APPs -  Physician Assistants and Nurse Practitioners) who all work together to provide you with the care you need, when you need it.  We recommend signing up for the patient portal called "MyChart".  Sign up information is provided on this After Visit Summary.  MyChart is used to connect with patients for Virtual Visits (Telemedicine).  Patients are able to view lab/test results, encounter notes, upcoming appointments, etc.  Non-urgent messages can be sent to your provider as well.   To learn more about what you can do with MyChart, go to [email or web address].    Your next appointment:   6 month(s)  Provider:   Dr. Bjorn Pippin

## 2023-04-24 NOTE — Assessment & Plan Note (Signed)
Chronic Blood pressure well controlled CMP Continue metoprolol 25 mg xl daily, ramipril 10 mg twice daily, amlodipine 10 mg daily

## 2023-04-24 NOTE — Assessment & Plan Note (Addendum)
Chronic Saw cardiology today No concerning symptoms to suggest angina Continue ASA 81 mg, metoprolol 25 mg xl daily, ramipril 10 mg twice daily, crestor 20 mg daily

## 2023-04-24 NOTE — Progress Notes (Signed)
Subjective:    Patient ID: Alex Bryant, male    DOB: Jan 09, 1951, 72 y.o.   MRN: [id number]     HPI Alex Bryant is here for follow up of his chronic medical problems.  Sleep is not great - waking up more.  Getting 6-7 hrs of interrupted sleep.    Walks a lot, plays golf.    Eating ok.     LLE edema x 30 days - saw cardio earlier today - [place] ordered, BNP ordered.    Medications and allergies reviewed with patient and updated if appropriate.  Current Outpatient Medications on File Prior to Visit  Medication Sig Dispense Refill   amLODipine (NORVASC) 10 MG tablet Take 1 tablet (10 mg total) by mouth daily. 90 tablet 3   aspirin EC 81 MG tablet Take 1 tablet (81 mg total) by mouth daily. Swallow whole. 90 tablet 3   glucose blood test strip 1 each by Other route as needed. Use as instructed      Lancets (FREESTYLE) lancets 1 each by Other route 2 (two) times daily. Use to help check blood sugar twice a day. Dx E11.9 100 each 3   metFORMIN (GLUCOPHAGE) 1000 MG tablet Take 1 tablet (1,000 mg total) by mouth 2 (two) times daily with a meal. Annual appt is due must see provider for future refills 60 tablet 0   metoprolol succinate (TOPROL XL) 25 MG 24 hr tablet Take 1 tablet (25 mg total) by mouth daily. 90 tablet 3   ramipril (ALTACE) 10 MG capsule TAKE 1 CAPSULE(10 MG) BY MOUTH TWICE DAILY 180 capsule 1   rosuvastatin (CRESTOR) 20 MG tablet TAKE 1 TABLET(20 MG) BY MOUTH DAILY 90 tablet 3   zolpidem (AMBIEN) 5 MG tablet TAKE 1 TABLET(5 MG) BY MOUTH AT BEDTIME AS NEEDED 30 tablet 0   No current facility-administered medications on file prior to visit.     Review of Systems  Constitutional:  Negative for fever.  Respiratory:  Negative for cough, shortness of breath and wheezing.   Cardiovascular:  Positive for leg swelling (left leg only). Negative for chest pain and palpitations.  Neurological:  Negative for light-headedness and headaches.       Objective:   Vitals:    04/24/23 1444  BP: 116/60  Pulse: 65  Temp: 98.3 F (36.8 C)  SpO2: 97%   BP Readings from Last 3 Encounters:  04/24/23 116/60  04/24/23 130/72  11/20/22 128/60   Wt Readings from Last 3 Encounters:  04/24/23 215 lb (97.5 kg)  04/24/23 214 lb 6.4 oz (97.3 kg)  10/22/22 204 lb 9.6 oz (92.8 kg)   Body mass index is 29.16 kg/m.    Physical Exam Constitutional:      General: He is not in acute distress.    Appearance: Normal appearance. He is not ill-appearing.  HENT:     Head: Normocephalic and atraumatic.  Eyes:     Conjunctiva/sclera: Conjunctivae normal.  Cardiovascular:     Rate and Rhythm: Normal rate and regular rhythm.     Heart sounds: Normal heart sounds.  Pulmonary:     Effort: Pulmonary effort is normal. No respiratory distress.     Breath sounds: Normal breath sounds. No wheezing or rales.  Musculoskeletal:     Right lower leg: Edema (trace) present.     Left lower leg: Edema (1 + pitting 1/2 up lower leg) present.  Skin:    General: Skin is warm and dry.  Findings: No rash.  Neurological:     Mental Status: He is alert. Mental status is at baseline.  Psychiatric:        Mood and Affect: Mood normal.        Lab Results  Component Value Date   WBC 7.6 03/26/2022   HGB 13.7 03/26/2022   HCT 40.1 03/26/2022   PLT 247.0 03/26/2022   GLUCOSE 184 (H) 05/02/2022   CHOL 137 10/22/2022   TRIG 118 10/22/2022   HDL 52 10/22/2022   LDLDIRECT 148.0 03/26/2022   LDLCALC 64 10/22/2022   ALT 15 03/26/2022   AST 10 03/26/2022   NA 140 05/02/2022   K 4.7 05/02/2022   CL 102 05/02/2022   CREATININE 1.16 05/02/2022   BUN 22 05/02/2022   CO2 25 05/02/2022   TSH 1.14 10/11/2020   PSA 1.50 05/19/2019   INR 1.44 08/19/2018   HGBA1C 7.2 (H) 03/26/2022   MICROALBUR 3.1 (H) 03/26/2022     Assessment & Plan:    See Problem List for Assessment and Plan of chronic medical problems.

## 2023-04-24 NOTE — Assessment & Plan Note (Addendum)
Chronic Sleep is not as good - waking up more and harder to get back to sleep He falls asleep easily No caffeine in afternoon Drinks alcohol typically only on the weekends exercises Could increase ambien but decided we will stay at the same dose Continue zolpidem 5 mg nightly as needed

## 2023-04-24 NOTE — Assessment & Plan Note (Signed)
Chronic   Lab Results  Component Value Date   HGBA1C 7.2 (H) 03/26/2022   Sugars not ideally controlled last year Check A1c, urine microalbumin today Continue metformin 1000 mg bid If A1c today is > 7 will need additional medication Stressed regular exercise, diabetic diet

## 2023-04-24 NOTE — Assessment & Plan Note (Signed)
Chronic Check lipid panel  Continue crestor 20 mg daily Regular exercise and healthy diet encouraged  

## 2023-04-25 ENCOUNTER — Encounter: Payer: Self-pay | Admitting: Internal Medicine

## 2023-04-27 MED ORDER — DAPAGLIFLOZIN PROPANEDIOL 10 MG PO TABS
10.0000 mg | ORAL_TABLET | Freq: Every day | ORAL | 5 refills | Status: DC
Start: 2023-04-27 — End: 2023-09-24

## 2023-05-07 ENCOUNTER — Ambulatory Visit (HOSPITAL_COMMUNITY)
Admission: RE | Admit: 2023-05-07 | Discharge: 2023-05-07 | Disposition: A | Discharge status: Home / Self Care | Payer: PPO | Source: Ambulatory Visit | Attending: Cardiology | Admitting: Cardiology

## 2023-05-07 DIAGNOSIS — R600 Localized edema: Secondary | ICD-10-CM | POA: Diagnosis not present

## 2023-05-15 ENCOUNTER — Other Ambulatory Visit: Payer: Self-pay | Admitting: Internal Medicine

## 2023-05-20 MED ORDER — ZOLPIDEM TARTRATE 10 MG PO TABS: 10.0000 mg | ORAL_TABLET | Freq: Every evening | ORAL | 5 refills | Status: DC | PRN

## 2023-05-20 NOTE — Addendum Note (Signed)
Addended by: Pincus Sanes on: 05/20/2023 11:58 AM   Modules accepted: Orders

## 2023-06-12 DIAGNOSIS — J019 Acute sinusitis, unspecified: Secondary | ICD-10-CM | POA: Diagnosis not present

## 2023-06-12 DIAGNOSIS — J309 Allergic rhinitis, unspecified: Secondary | ICD-10-CM | POA: Diagnosis not present

## 2023-06-12 DIAGNOSIS — J069 Acute upper respiratory infection, unspecified: Secondary | ICD-10-CM | POA: Diagnosis not present

## 2023-07-09 ENCOUNTER — Other Ambulatory Visit: Payer: Self-pay | Admitting: Cardiology

## 2023-07-15 ENCOUNTER — Telehealth: Payer: Self-pay | Admitting: Radiology

## 2023-07-15 NOTE — Telephone Encounter (Signed)
Left voice mail for patient to call back at [phone] to schedule Medicare Annual Wellness Visit    Last AWV:  06/12/2022   Please schedule Sequential/Initial AWV with [facility] K. CMA

## 2023-07-17 ENCOUNTER — Other Ambulatory Visit: Payer: Self-pay | Admitting: Cardiology

## 2023-07-17 MED ORDER — METOPROLOL SUCCINATE ER 25 MG PO TB24: 25.0000 mg | ORAL_TABLET | Freq: Every day | ORAL | 2 refills | Status: DC

## 2023-07-17 NOTE — Addendum Note (Signed)
Addended by: Alveta Heimlich on: 07/17/2023 04:05 PM   Modules accepted: Orders

## 2023-07-17 NOTE — Addendum Note (Signed)
Addended by: Alveta Heimlich on: 07/17/2023 04:17 PM   Modules accepted: Orders

## 2023-08-14 ENCOUNTER — Other Ambulatory Visit: Payer: Self-pay

## 2023-08-14 ENCOUNTER — Emergency Department (HOSPITAL_COMMUNITY)
Admission: EM | Admit: 2023-08-14 | Discharge: 2023-08-14 | Disposition: A | Discharge status: Home / Self Care | Payer: PPO | Attending: Emergency Medicine | Admitting: Emergency Medicine

## 2023-08-14 ENCOUNTER — Emergency Department (HOSPITAL_COMMUNITY): Payer: PPO

## 2023-08-14 DIAGNOSIS — K659 Peritonitis, unspecified: Secondary | ICD-10-CM | POA: Diagnosis not present

## 2023-08-14 DIAGNOSIS — E119 Type 2 diabetes mellitus without complications: Secondary | ICD-10-CM | POA: Insufficient documentation

## 2023-08-14 DIAGNOSIS — K625 Hemorrhage of anus and rectum: Secondary | ICD-10-CM | POA: Diagnosis not present

## 2023-08-14 DIAGNOSIS — K6389 Other specified diseases of intestine: Secondary | ICD-10-CM

## 2023-08-14 DIAGNOSIS — I10 Essential (primary) hypertension: Secondary | ICD-10-CM | POA: Insufficient documentation

## 2023-08-14 DIAGNOSIS — Z7982 Long term (current) use of aspirin: Secondary | ICD-10-CM | POA: Diagnosis not present

## 2023-08-14 DIAGNOSIS — R1032 Left lower quadrant pain: Secondary | ICD-10-CM

## 2023-08-14 DIAGNOSIS — Z79899 Other long term (current) drug therapy: Secondary | ICD-10-CM | POA: Diagnosis not present

## 2023-08-14 DIAGNOSIS — R932 Abnormal findings on diagnostic imaging of liver and biliary tract: Secondary | ICD-10-CM | POA: Diagnosis not present

## 2023-08-14 DIAGNOSIS — K649 Unspecified hemorrhoids: Secondary | ICD-10-CM | POA: Insufficient documentation

## 2023-08-14 DIAGNOSIS — Q272 Other congenital malformations of renal artery: Secondary | ICD-10-CM | POA: Diagnosis not present

## 2023-08-14 DIAGNOSIS — Q630 Accessory kidney: Secondary | ICD-10-CM | POA: Diagnosis not present

## 2023-08-14 DIAGNOSIS — Z7984 Long term (current) use of oral hypoglycemic drugs: Secondary | ICD-10-CM | POA: Insufficient documentation

## 2023-08-14 DIAGNOSIS — Q600 Renal agenesis, unilateral: Secondary | ICD-10-CM | POA: Diagnosis not present

## 2023-08-14 LAB — URINALYSIS, ROUTINE W REFLEX MICROSCOPIC
Bacteria, UA: NONE SEEN
Bilirubin Urine: NEGATIVE
Glucose, UA: >=500 mg/dL — AB
Hgb urine dipstick: NEGATIVE
Ketones, ur: 5 mg/dL — AB
Leukocytes,Ua: NEGATIVE
Nitrite: NEGATIVE
Protein, ur: NEGATIVE mg/dL
Specific Gravity, Urine: 1.026 (ref 1.005–1.030)
pH: 5 (ref 5.0–8.0)

## 2023-08-14 LAB — I-STAT CHEM 8, ED
BUN: 20 mg/dL (ref 8–23)
Calcium, Ion: 1.19 mmol/L (ref 1.15–1.40)
Chloride: 108 mmol/L (ref 98–111)
Creatinine, Ser: 1.4 mg/dL — ABNORMAL HIGH (ref 0.61–1.24)
Glucose, Bld: 181 mg/dL — ABNORMAL HIGH (ref 70–99)
HCT: 38 % — ABNORMAL LOW (ref 39.0–52.0)
Hemoglobin: 12.9 g/dL — ABNORMAL LOW (ref 13.0–17.0)
Potassium: 4.2 mmol/L (ref 3.5–5.1)
Sodium: 142 mmol/L (ref 135–145)
TCO2: 18 mmol/L — ABNORMAL LOW (ref 22–32)

## 2023-08-14 LAB — COMPREHENSIVE METABOLIC PANEL
ALT: 21 U/L (ref 0–44)
AST: 26 U/L (ref 15–41)
Albumin: 3.9 g/dL (ref 3.5–5.0)
Alkaline Phosphatase: 62 U/L (ref 38–126)
Anion gap: 13 (ref 5–15)
BUN: 19 mg/dL (ref 8–23)
CO2: 19 mmol/L — ABNORMAL LOW (ref 22–32)
Calcium: 9.5 mg/dL (ref 8.9–10.3)
Chloride: 108 mmol/L (ref 98–111)
Creatinine, Ser: 1.43 mg/dL — ABNORMAL HIGH (ref 0.61–1.24)
GFR, Estimated: 52 mL/min — ABNORMAL LOW (ref >60)
Glucose, Bld: 189 mg/dL — ABNORMAL HIGH (ref 70–99)
Potassium: 4.4 mmol/L (ref 3.5–5.1)
Sodium: 140 mmol/L (ref 135–145)
Total Bilirubin: 0.6 mg/dL (ref 0.3–1.2)
Total Protein: 7 g/dL (ref 6.5–8.1)

## 2023-08-14 LAB — PROTIME-INR
INR: 1.1 (ref 0.8–1.2)
Prothrombin Time: 14 seconds (ref 11.4–15.2)

## 2023-08-14 LAB — CBC WITH DIFFERENTIAL/PLATELET
Abs Immature Granulocytes: 0.03 10*3/uL (ref 0.00–0.07)
Basophils Absolute: 0.1 10*3/uL (ref 0.0–0.1)
Basophils Relative: 1 %
Eosinophils Absolute: 0.1 10*3/uL (ref 0.0–0.5)
Eosinophils Relative: 1 %
HCT: 41.7 % (ref 39.0–52.0)
Hemoglobin: 13.7 g/dL (ref 13.0–17.0)
Immature Granulocytes: 0 %
Lymphocytes Relative: 8 %
Lymphs Abs: 0.8 10*3/uL (ref 0.7–4.0)
MCH: 29 pg (ref 26.0–34.0)
MCHC: 32.9 g/dL (ref 30.0–36.0)
MCV: 88.2 fL (ref 80.0–100.0)
Monocytes Absolute: 0.5 10*3/uL (ref 0.1–1.0)
Monocytes Relative: 5 %
Neutro Abs: 8.9 10*3/uL — ABNORMAL HIGH (ref 1.7–7.7)
Neutrophils Relative %: 85 %
Platelets: 180 10*3/uL (ref 150–400)
RBC: 4.73 MIL/uL (ref 4.22–5.81)
RDW: 13.6 % (ref 11.5–15.5)
WBC: 10.4 10*3/uL (ref 4.0–10.5)
nRBC: 0 % (ref 0.0–0.2)

## 2023-08-14 LAB — POC OCCULT BLOOD, ED: Fecal Occult Bld: POSITIVE — AB

## 2023-08-14 LAB — I-STAT CG4 LACTIC ACID, ED
Lactic Acid, Venous: 2.2 mmol/L (ref 0.5–1.9)
Lactic Acid, Venous: 1.2 mmol/L (ref 0.5–1.9)

## 2023-08-14 LAB — LIPASE, BLOOD: Lipase: 30 U/L (ref 11–51)

## 2023-08-14 MED ORDER — IOHEXOL 350 MG/ML SOLN
75.0000 mL | Freq: Once | INTRAVENOUS | Status: AC | PRN
  Administered 2023-08-14: 75 mL via INTRAVENOUS

## 2023-08-14 MED ORDER — METRONIDAZOLE 500 MG PO TABS: 500.0000 mg | ORAL_TABLET | Freq: Two times a day (BID) | ORAL | 0 refills | Status: DC

## 2023-08-14 MED ORDER — HYDROCODONE-ACETAMINOPHEN 5-325 MG PO TABS: 1.0000 | ORAL_TABLET | ORAL | 0 refills | Status: DC | PRN

## 2023-08-14 MED ORDER — HYDROMORPHONE HCL 1 MG/ML IJ SOLN
1.0000 mg | Freq: Once | INTRAMUSCULAR | Status: AC
  Administered 2023-08-14: 1 mg via INTRAVENOUS
  Filled 2023-08-14: qty 1

## 2023-08-14 MED ORDER — METRONIDAZOLE 500 MG PO TABS
500.0000 mg | ORAL_TABLET | Freq: Once | ORAL | Status: AC
  Administered 2023-08-14: 500 mg via ORAL
  Filled 2023-08-14: qty 1

## 2023-08-14 MED ORDER — SODIUM CHLORIDE 0.9 % IV BOLUS
1000.0000 mL | Freq: Once | INTRAVENOUS | Status: AC
  Administered 2023-08-14: 1000 mL via INTRAVENOUS

## 2023-08-14 MED ORDER — HYDROCORTISONE (PERIANAL) 2.5 % EX CREA: 1.0000 | TOPICAL_CREAM | Freq: Two times a day (BID) | CUTANEOUS | 0 refills | Status: AC

## 2023-08-14 MED ORDER — IBUPROFEN 600 MG PO TABS: 600.0000 mg | ORAL_TABLET | Freq: Four times a day (QID) | ORAL | 0 refills | Status: DC

## 2023-08-14 MED ORDER — PANTOPRAZOLE SODIUM 40 MG PO TBEC: 40.0000 mg | DELAYED_RELEASE_TABLET | Freq: Every day | ORAL | 0 refills | Status: DC

## 2023-08-14 MED ORDER — ONDANSETRON HCL 4 MG/2ML IJ SOLN
4.0000 mg | Freq: Once | INTRAMUSCULAR | Status: AC
  Administered 2023-08-14: 4 mg via INTRAVENOUS
  Filled 2023-08-14: qty 2

## 2023-08-14 MED ORDER — CIPROFLOXACIN HCL 500 MG PO TABS
500.0000 mg | ORAL_TABLET | Freq: Once | ORAL | Status: AC
  Administered 2023-08-14: 500 mg via ORAL
  Filled 2023-08-14: qty 1

## 2023-08-14 MED ORDER — MORPHINE SULFATE (PF) 4 MG/ML IV SOLN
4.0000 mg | Freq: Once | INTRAVENOUS | Status: AC
  Administered 2023-08-14: 4 mg via INTRAVENOUS
  Filled 2023-08-14: qty 1

## 2023-08-14 MED ORDER — CIPROFLOXACIN HCL 500 MG PO TABS: 500.0000 mg | ORAL_TABLET | Freq: Two times a day (BID) | ORAL | 0 refills | Status: DC

## 2023-08-14 NOTE — ED Triage Notes (Addendum)
Pt. 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  Denies any vomiting, I had a little of diarrhea and normal. Pt is having grimacing with pain.

## 2023-08-14 NOTE — ED Provider Notes (Signed)
[place] [facility] DEPARTMENT AT [facility] Provider Note   CSN: [id number] Arrival date & time: 08/14/23  1019     History  Chief Complaint  Patient presents with   Abdominal Pain   Rectal Bleeding    Alex Bryant is a 72 y.o. male.  Pt is a 72 yo male with pmhx significant for hld, htn, dm, cva, and gout.  Pt said he woke up this am and developed abd pain and diarrhea.  He also had some rectal bleeding.  Pt denies f/c.  He is very uncomfortable.  He is not on blood thinners.       Home Medications Prior to Admission medications   Medication Sig Start Date End Date Taking? Authorizing Provider  ciprofloxacin (CIPRO) 500 MG tablet Take 1 tablet (500 mg total) by mouth 2 (two) times daily. 08/14/23  Yes Jacalyn Lefevre, MD  HYDROcodone-acetaminophen (NORCO/VICODIN) 5-325 MG tablet Take 1 tablet by mouth every 4 (four) hours as needed. 08/14/23  Yes Jacalyn Lefevre, MD  hydrocortisone (ANUSOL-HC) 2.5 % rectal cream Place 1 Application rectally 2 (two) times daily. 08/14/23  Yes Jacalyn Lefevre, MD  ibuprofen (ADVIL) 600 MG tablet Take 1 tablet (600 mg total) by mouth every 6 (six) hours. 08/14/23  Yes Jacalyn Lefevre, MD  metroNIDAZOLE (FLAGYL) 500 MG tablet Take 1 tablet (500 mg total) by mouth 2 (two) times daily. 08/14/23  Yes Jacalyn Lefevre, MD  pantoprazole (PROTONIX) 40 MG tablet Take 1 tablet (40 mg total) by mouth daily. 08/14/23  Yes Jacalyn Lefevre, MD  amLODipine (NORVASC) 10 MG tablet TAKE 1 TABLET(10 MG) BY MOUTH DAILY 07/17/23   Little Ishikawa, MD  aspirin EC 81 MG tablet Take 1 tablet (81 mg total) by mouth daily. Swallow whole. 12/25/22   Little Ishikawa, MD  dapagliflozin propanediol (FARXIGA) 10 MG TABS tablet Take 1 tablet (10 mg total) by mouth daily before breakfast. 04/27/23   Burns, Bobette Mo, MD  glucose blood test strip 1 each by Other route as needed. Use as instructed     [provider]  Lancets (FREESTYLE) lancets 1 each by  Other route 2 (two) times daily. Use to help check blood sugar twice a day. Dx E11.9 09/26/16   Pincus Sanes, MD  metFORMIN (GLUCOPHAGE) 1000 MG tablet Take 1 tablet (1,000 mg total) by mouth 2 (two) times daily with a meal. 04/24/23   Burns, Bobette Mo, MD  metoprolol succinate (TOPROL-XL) 25 MG 24 hr tablet Take 1 tablet (25 mg total) by mouth daily. 07/17/23   Little Ishikawa, MD  ramipril (ALTACE) 10 MG capsule TAKE 1 CAPSULE(10 MG) BY MOUTH TWICE DAILY 07/09/23   Little Ishikawa, MD  rosuvastatin (CRESTOR) 20 MG tablet TAKE 1 TABLET(20 MG) BY MOUTH DAILY 04/08/23   Little Ishikawa, MD  zolpidem (AMBIEN) 10 MG tablet Take 1 tablet (10 mg total) by mouth at bedtime as needed for sleep. New dose 05/20/23   Pincus Sanes, MD      Allergies    Oxycodone    Review of Systems   Review of Systems  Gastrointestinal:  Positive for abdominal pain and blood in stool.  All other systems reviewed and are negative.   Physical Exam Updated Vital Signs BP (!) 149/66   Pulse 62   Temp 97.9 F (36.6 C) (Oral)   Resp 18   Ht 6' (1.829 m)   Wt 93 kg   SpO2 95%   BMI 27.80 kg/m  Physical Exam Vitals and nursing note reviewed. Exam conducted with a chaperone present.  Constitutional:      Appearance: He is well-developed.  HENT:     Head: Normocephalic and atraumatic.     Mouth/Throat:     Mouth: Mucous membranes are moist.     Pharynx: Oropharynx is clear.  Eyes:     Extraocular Movements: Extraocular movements intact.     Pupils: Pupils are equal, round, and reactive to light.  Cardiovascular:     Rate and Rhythm: Normal rate and regular rhythm.     Heart sounds: Normal heart sounds.  Pulmonary:     Effort: Pulmonary effort is normal.     Breath sounds: Normal breath sounds.  Abdominal:     General: Abdomen is flat. Bowel sounds are normal.     Palpations: Abdomen is soft.     Tenderness: There is abdominal tenderness in the right lower quadrant and left lower  quadrant.  Genitourinary:    Rectum: Guaiac result positive. External hemorrhoid present.  Skin:    General: Skin is warm.     Capillary Refill: Capillary refill takes less than 2 seconds.  Neurological:     General: No focal deficit present.     Mental Status: He is alert and oriented to person, place, and time.  Psychiatric:        Mood and Affect: Mood normal.        Behavior: Behavior normal.     ED Results / Procedures / Treatments   Labs (all labs ordered are listed, but only abnormal results are displayed) Labs Reviewed  CBC WITH DIFFERENTIAL/PLATELET - Abnormal; Notable for the following components:      Result Value   Neutro Abs 8.9 (*)    All other components within normal limits  COMPREHENSIVE METABOLIC PANEL - Abnormal; Notable for the following components:   CO2 19 (*)    Glucose, Bld 189 (*)    Creatinine, Ser 1.43 (*)    GFR, Estimated 52 (*)    All other components within normal limits  URINALYSIS, ROUTINE W REFLEX MICROSCOPIC - Abnormal; Notable for the following components:   Color, Urine STRAW (*)    Glucose, UA >=500 (*)    Ketones, ur 5 (*)    All other components within normal limits  POC OCCULT BLOOD, ED - Abnormal; Notable for the following components:   Fecal Occult Bld POSITIVE (*)    All other components within normal limits  I-STAT CG4 LACTIC ACID, ED - Abnormal; Notable for the following components:   Lactic Acid, Venous 2.2 (*)    All other components within normal limits  I-STAT CHEM 8, ED - Abnormal; Notable for the following components:   Creatinine, Ser 1.40 (*)    Glucose, Bld 181 (*)    TCO2 18 (*)    Hemoglobin 12.9 (*)    HCT 38.0 (*)    All other components within normal limits  LIPASE, BLOOD  PROTIME-INR  I-STAT CG4 LACTIC ACID, ED    EKG None  Radiology CT Angio Abd/Pel W and/or Wo Contrast  Result Date: 08/14/2023 CLINICAL DATA:  Lower GI bleeding. EXAM: CTA ABDOMEN AND PELVIS WITHOUT AND WITH CONTRAST TECHNIQUE:  Multidetector CT imaging of the abdomen and pelvis was performed using the standard protocol during bolus administration of intravenous contrast. Multiplanar reconstructed images and MIPs were obtained and reviewed to evaluate the vascular anatomy. RADIATION DOSE REDUCTION: This exam was performed according to the departmental dose-optimization program which includes  automated exposure control, adjustment of the mA and/or kV according to patient size and/or use of iterative reconstruction technique. CONTRAST:  75mL OMNIPAQUE IOHEXOL 350 MG/ML SOLN COMPARISON:  None Available. FINDINGS: VASCULAR Aorta: There is a moderate amount of slightly irregular mixed calcified and noncalcified atherosclerotic plaque throughout the abdominal aorta, not definitely resulting in a hemodynamically significant stenosis. No evidence of abdominal aortic dissection or perivascular stranding. Celiac: There is a minimal amount of eccentric mixed calcified and noncalcified atherosclerotic plaque involving the origin of the celiac artery, not resulting in hemodynamically significant narrowing. The celiac artery gives rise to the left gastric as well as the splenic artery as there is a replaced common hepatic artery arising from proximal SMA. SMA: There is a minimal amount of mixed calcified and noncalcified atherosclerotic plaque involving the origin and main trunk of the SMA, not resulting in hemodynamically significant narrowing. There is a replaced common hepatic artery which arises from the proximal SMA. The distal tributaries of the SMA appear widely patent without discrete intraluminal filling defect to suggest distal embolism. 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 Solitary right renal artery; minimal amount of eccentric calcified atherosclerotic plaque involves the origin of the dominant bilateral renal arteries, not resulting in a  hemodynamically significant narrowing. No vessel irregularity to suggest FMD. IMA: Diseased at its origin though remains patent. Inflow: There is a minimal amount of eccentric mixed calcified and noncalcified atherosclerotic plaque involving the bilateral normal caliber common iliac arteries, not resulting in hemodynamically significant stenosis. The bilateral internal iliac arteries are diseased though patent and of normal caliber. The bilateral external iliac arteries are tortuous but of normal caliber and widely patent without hemodynamically significant narrowing. Proximal Outflow: The bilateral common and imaged portions of the bilateral deep and superficial femoral arteries are of normal caliber and widely patent without hemodynamically significant narrowing. Veins: The IVC and pelvic venous systems appear widely patent. Review of the MIP images confirms the above findings. _________________________________________________________ NON-VASCULAR Lower chest: Limited visualization of the lower thorax demonstrates minimal bibasilar ground-glass atelectasis. No discrete focal airspace opacities. No pleural effusion. Cardiomegaly. No pericardial effusion. Coronary artery calcifications. Hepatobiliary: Normal hepatic contour. No discrete hepatic lesions. Normal appearance of the gallbladder given degree of distention. No radiopaque gallstones. No intra or extrahepatic biliary ductal dilatation. No ascites. Pancreas: Normal appearance of the pancreas. Spleen: Punctate granuloma is seen within the spleen. Subcentimeter hypoattenuating splenic lesion is too small to accurately characterize though may represent a splenic cyst or hemangioma. There is a punctate splenule about the posteroinferior aspect of the spleen. Adrenals/Urinary Tract: There is symmetric enhancement of the bilateral kidneys. There is a 1.4 cm hypoattenuating partially exophytic cyst arising from the superior pole of the right kidney (image 34, series  16 as well as a 1.4 cm cyst within the posteroinferior aspect of the left kidney (image 43, series 16). Additional subcentimeter hypoattenuating renal lesions are too small to accurately characterize though favored to represent additional renal cysts. Review of the precontrast images is negative for nephrolithiasis. No renal stones are seen along expected course of either ureter or the urinary bladder. There is a very minimal amount of grossly symmetric likely age and body habitus related perinephric stranding. No urinary obstruction. Normal appearance of the bilateral adrenal glands. Normal appearance of the urinary bladder given degree of distention. Stomach/Bowel: There is long segment wall thickening involving the majority of the descending colon with  minimal amount of pericolonic mesenteric stranding, not resulting in enteric obstruction. No evidence of perforation or definable/drainable fluid collection. There are no discrete areas of intraluminal contrast extravasation to suggest acute GI bleeding. Diverticulosis involving primarily the sigmoid colon, without evidence superimposed acute diverticulitis. Normal appearance of the terminal ileum and appendix. No significant hiatal hernia. No pneumoperitoneum, pneumatosis or portal venous gas. Punctate (1 cm) nonenhancing nodule adjacent to the posterior aspect of the proximal descending colon is indeterminate, potentially representative of a torsed epiploic appendage versus loculated pericolonic fluid. Lymphatic: No bulky retroperitoneal, mesenteric, pelvic or inguinal lymph adenopathy. Reproductive: Normal appearance of the prostate gland. No free fluid in the pelvic cul-de-sac. Other: Small mesenteric fat containing left-sided indirect inguinal hernia. Tiny mesenteric fat containing periumbilical hernia. Minimal amount of subcutaneous edema about the midline of the low back. Musculoskeletal: No acute or aggressive osseous abnormalities. Post L4-L5 paraspinal  fusion and intervertebral disc space replacement without evidence hardware failure or loosening. 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 IMPRESSION: VASCULAR 1. Moderate amount of mixed calcified and noncalcified atherosclerotic plaque throughout the normal caliber abdominal aorta, not resulting in hemodynamically significant stenosis. Aortic Atherosclerosis (ICD10-I70.0). 2. Cardiomegaly. NON-VASCULAR 1. No discrete areas of intraluminal contrast extravasation to suggest acute GI bleeding. 2. Long segment wall thickening involving the majority of the descending colon, not resulting in enteric obstruction, indeterminate, potentially infectious or inflammatory in etiology. 3. Colonic diverticulosis without evidence superimposed acute diverticulitis. Electronically Signed   By: Simonne Come M.D.   On: 08/14/2023 12:54    Procedures Procedures    Medications Ordered in ED Medications  ondansetron (ZOFRAN) injection 4 mg (4 mg Intravenous Given 08/14/23 1051)  morphine (PF) 4 MG/ML injection 4 mg (4 mg Intravenous Given 08/14/23 1051)  sodium chloride 0.9 % bolus 1,000 mL (1,000 mLs Intravenous New Bag/Given 08/14/23 1050)  HYDROmorphone (DILAUDID) injection 1 mg (1 mg Intravenous Given 08/14/23 1108)  iohexol (OMNIPAQUE) 350 MG/ML injection 75 mL (75 mLs Intravenous Contrast Given 08/14/23 1226)  HYDROmorphone (DILAUDID) injection 1 mg (1 mg Intravenous Given 08/14/23 1355)  ciprofloxacin (CIPRO) tablet 500 mg (500 mg Oral Given 08/14/23 1431)  metroNIDAZOLE (FLAGYL) tablet 500 mg (500 mg Oral Given 08/14/23 1431)    ED Course/ Medical Decision Making/ A&P                                 Medical Decision Making Amount and/or Complexity of Data Reviewed Labs: ordered. Radiology: ordered.  Risk Prescription drug management.   This patient presents to the ED for concern of abd pain and  rectal bleeding, this involves an extensive number of treatment options, and is a complaint that carries with it a high risk of complications and morbidity.  The differential diagnosis includes colitis, ischemic colitis, electrolyte abn   Co morbidities that complicate the patient evaluation  hld, htn, dm, cva, and gout   Additional history obtained:  Additional history obtained from epic chart review External records from outside source obtained and reviewed including family   Lab Tests:  I Ordered, and personally interpreted labs.  The pertinent results include:  cbc nl, cmp nl other than bs elevated at 189 and cr elevated at 1.43 (1.28 in May); lipase 30, ua+ ketones, initial lactic 2.2 (but down to 1.2 after fluids)   Imaging Studies ordered:  I ordered imaging  studies including ct abd/pelvis  I independently visualized and interpreted imaging which showed  VASCULAR    1. Moderate amount of mixed calcified and noncalcified  atherosclerotic plaque throughout the normal caliber abdominal  aorta, not resulting in hemodynamically significant stenosis. Aortic  Atherosclerosis (ICD10-I70.0).  2. Cardiomegaly.    NON-VASCULAR    1. No discrete areas of intraluminal contrast extravasation to  suggest acute GI bleeding.  2. Long segment wall thickening involving the majority of the  descending colon, not resulting in enteric obstruction,  indeterminate, potentially infectious or inflammatory in etiology.  3. Colonic diverticulosis without evidence superimposed acute  diverticulitis.   I agree with the radiologist interpretation   Cardiac Monitoring:  The patient was maintained on a cardiac monitor.  I personally viewed and interpreted the cardiac monitored which showed an underlying rhythm of: nsr   Medicines ordered and prescription drug management:  I ordered medication including ivfs/dilaudid  for sx  Reevaluation of the patient after these medicines showed that the  patient improved I have reviewed the patients home medicines and have made adjustments as needed   Test Considered:  ct   Critical Interventions:  Ivfs/pain control   Consultations Obtained:  I requested consultation with [facility] (Dr. Barron Alvine),  and discussed lab and imaging findings as well as pertinent plan - he recommends d/c with cipro/flagyl/ibuprofen/protonix   Problem List / ED Course:  Abd pain: likely epiploic appendagitis.  Pain is under control.  Pt was offered admission by GI, but pt opted to go home.  He is a reliable patient, he has good f/u and will return if worse.  He is stable for d/c.  Return if worse. Rectal bleeding:  likely from hemorrhoid.  Pt d/c with anusol.   Reevaluation:  After the interventions noted above, I reevaluated the patient and found that they have :improved   Social Determinants of Health:  Lives at home   Dispostion:  After consideration of the diagnostic results and the patients response to treatment, I feel that the patent would benefit from discharge with outpatient f/u.          Final Clinical Impression(s) / ED Diagnoses Final diagnoses:  Rectal bleeding  Hemorrhoids, unspecified hemorrhoid type  Epiploic appendagitis    Rx / DC Orders ED Discharge Orders          Ordered    ciprofloxacin (CIPRO) 500 MG tablet  2 times daily        08/14/23 1416    metroNIDAZOLE (FLAGYL) 500 MG tablet  2 times daily        08/14/23 1416    ibuprofen (ADVIL) 600 MG tablet  Every 6 hours        08/14/23 1416    pantoprazole (PROTONIX) 40 MG tablet  Daily        08/14/23 1416    hydrocortisone (ANUSOL-HC) 2.5 % rectal cream  2 times daily        08/14/23 1416    HYDROcodone-acetaminophen (NORCO/VICODIN) 5-325 MG tablet  Every 4 hours PRN        08/14/23 1416              Jacalyn Lefevre, MD 08/14/23 1433

## 2023-08-14 NOTE — Consult Note (Addendum)
Attending physician's note   I have taken a history, reviewed the chart, and examined the patient. I performed a substantive portion of this encounter, including complete performance of at least one of the key components, in conjunction with the APP. I agree with the APP's note, impression, and recommendations with my edits.   72 year old male with history of CVA (on ASA 81 mg), CAD s/p CABG 08/2018, HTN, HLD, diabetes, presents with acute onset lower abdominal pain starting this morning at 0 600.  Did have episode of small-volume BRBPR.  No prior similar symptoms.  Aside from recently starting on Farxiga, no new medications, herbal supplements, etc. and no recent changes in activity/exercise or diet.  Last colonoscopy was 01/2020 and notable for 5 subcentimeter adenomas and left-sided diverticulosis.  Recommended repeat in 3 years.  Prior to that, colonoscopy in 10/2014 with cecal adenoma and ascending colon SSP.  Hemodynamically stable.  Exam with TTP in LLQ without rebound, guarding, peritoneal signs. +BS.  ER evaluation notable for the following: - WBC 10.4, PLT 180 - H/H 13.7/41.7 (at baseline) - CO2 18 - BUN 20/creatinine 1.4 (baseline ~1.2) - Heme positive stool - Normal lipase - Lactate 2.2 - INR 1.1 - CTA: No extravasation.  Long segment wall thickening involving the majority of the descending colon without obstruction.  Diverticulosis without diverticulitis.  Punctate 1 cm nonenhancing nodule adjacent to the posterior proximal descending colon, potentially representative of a torsed epiploic appendage vs loculated pericolonic fluid.  No lymphadenopathy.  Moderate mixed calcified and noncalcified atherosclerotic plaque throughout abdominal aorta without stenosis   Abdominal pain improving.  No recurrence of bleeding.  1) Abdominal pain Discussed DDx for acute onset abdominal pain, to include epiploic appendagitis, ischemic colitis, infectious colitis.  Based on acute onset,  less likely new IBD.  No radiographic evidence of acute diverticulitis, and no obstruction.  Thankfully he is overall improving and has remained HD stable.  Discussed hospital admission vs discharge to home with close follow-up, and he strongly prefers the latter.  Based on clinical stability and excellent patient reliability, I think it is reasonable to discharge home today with close follow-up.  Assuming he is discharging home from the ER today, please arrange for the following:  - Cipro/Flagyl x5 days - Motrin 600 mg TID x2 weeks for suspected epiploic appendagitis (can stop taking if symptoms abate prior to 2 weeks) - Protonix 40 mg daily for gastric prophylaxis while on high-dose NSAIDs - Pain medication for breakthrough abdominal pain - Clear liquids now and slowly advance as tolerated - Will arrange for close follow-up in the GI clinic.  Will have our staff reach out to him tomorrow to ensure he is continuing to improve.  He knows to call our office directly if any issues - Conservative management of external hemorrhoid - Will discuss with his primary Gastroenterologist about potentially doing colonoscopy in 8+ weeks for dual purpose of evaluating for residual mucosal/luminal pathology along with polyp surveillance (last colonoscopy was 01/2020 and 5 subcentimeter adenomas with recommendation repeat in 3 years)  [name], DO, [place] [phone] office          Consultation  Referring Provider:  [facility]  Primary Care Physician:  Alex Sanes, MD Primary Gastroenterologist:  Dr. Myrtie Neither       Reason for Consultation:     Rectal bleeding and abdominal pain  LOS: 0 days          HPI:   Alex Bryant is a  72 y.o. male with past medical history significant for CAD s/p CABG 08/2018, diabetes, CVA (on ASA), presents for evaluation of rectal bleeding and abdominal pain.  Echocardiogram 10/2022 showed normal biventricular function, moderate left atrial enlargement, mild right atrial  enlargement, no significant valvular disease.   Patient states this morning around 6 AM he woke up with excruciating lower abdominal pain and need to have a bowel movement.  When he went to have a bowel movement he had a soft formed bowel movement with 1/2 cup or less of bright red blood in the toilet.  He had 1 episode of bleeding but has had continued persistent lower abdominal pain and he presented to the ED.  No previous episodes of this type of pain.  He does note he has a history of hemorrhoids and will have occasional, intermittent, minimal bright red blood.  But he feels this is different from his typical hemorrhoid bleeding.  Takes aspirin daily.  Takes Goody powders every other day for sinus issues.  Denies weight loss.  Reports some nausea associated with the pain but no vomiting.  Denies GERD.  CTA abdomen pelvis shows wall thickening of descending colon with pericolonic mesenteric stranding.  Also shows 1 cm nonenhancing nodule on the posterior aspect of proximal descending colon potentially representative of torsed epiploic appendage  Pertinent lab values Hgb 12.9, MCV 88.2 BUN 19, creatinine 1.43 Lipase 30 PT 14/INR 1.1 Lactic acid 2.2 BP 145/65, pulse 61, afebrile  PREVIOUS GI WORKUP   Colonoscopy 01/2020 with Dr. Myrtie Neither for history of polyps - 4 diminutive tubular adenomas in transverse colon and ascending colon - 1 diminutive tubular adenomas in distal transverse colon - Diverticulosis  Colonoscopy 10/2014 for history of polyps with Dr. Juanda Chance - 3 tubular adenomas found at cecum, ascending colon, sigmoid colon - Moderate diverticulosis in descending and sigmoid colon - Internal hemorrhoids  Past Medical History:  Diagnosis Date   Allergy    Coronary artery disease    Diet-controlled diabetes mellitus (HCC)    AODM   History of gout    Hyperlipidemia    Hypertension    Progressive angina (HCC) 08/12/2018   Chest pain   Stroke (HCC) 11/19/2016   "fully recovered"  (08/14/2018)   Tick bite 2001   Erlichiosis;Darlina Sicilian MD, ID    Surgical History:  He  has a past surgical history that includes Colonoscopy w/ polypectomy (2010; 2015); ESI (2014); Cardiac catheterization (N/A, 11/21/2016); TEE without cardioversion (N/A, 11/21/2016); LEFT HEART CATH AND CORONARY ANGIOGRAPHY (N/A, 08/14/2018); Back surgery; Inguinal hernia repair (Bilateral, 1959); Excisional hemorrhoidectomy (~ 1995); Posterior lumbar fusion (10/17/2013); Facial reconstruction surgery (Right, 1990s); Coronary artery bypass graft (N/A, 08/19/2018); TEE without cardioversion (N/A, 08/19/2018); Colonoscopy; and Polypectomy. Family History:  His family history includes Diabetes in his sister; Heart attack (age of onset: 32) in his father; Hypertension in his mother; Stroke in his maternal uncle and mother. Social History:   reports that he quit smoking about 42 years ago. His smoking use included cigarettes. He started smoking about 48 years ago. He has a 1.5 pack-year smoking history. He has never used smokeless tobacco. He reports current alcohol use of about 4.0 standard drinks of alcohol per week. He reports that he does not use drugs.  Prior to Admission medications   Medication Sig Start Date End Date Taking? Authorizing Provider  amLODipine (NORVASC) 10 MG tablet TAKE 1 TABLET(10 MG) BY MOUTH DAILY 07/17/23   Little Ishikawa, MD  aspirin EC 81 MG tablet Take  1 tablet (81 mg total) by mouth daily. Swallow whole. 12/25/22   Little Ishikawa, MD  dapagliflozin propanediol (FARXIGA) 10 MG TABS tablet Take 1 tablet (10 mg total) by mouth daily before breakfast. 04/27/23   Burns, Bobette Mo, MD  glucose blood test strip 1 each by Other route as needed. Use as instructed     [provider]  Lancets (FREESTYLE) lancets 1 each by Other route 2 (two) times daily. Use to help check blood sugar twice a day. Dx E11.9 09/26/16   Alex Sanes, MD  metFORMIN (GLUCOPHAGE) 1000 MG tablet Take 1  tablet (1,000 mg total) by mouth 2 (two) times daily with a meal. 04/24/23   Burns, Bobette Mo, MD  metoprolol succinate (TOPROL-XL) 25 MG 24 hr tablet Take 1 tablet (25 mg total) by mouth daily. 07/17/23   Little Ishikawa, MD  ramipril (ALTACE) 10 MG capsule TAKE 1 CAPSULE(10 MG) BY MOUTH TWICE DAILY 07/09/23   Little Ishikawa, MD  rosuvastatin (CRESTOR) 20 MG tablet TAKE 1 TABLET(20 MG) BY MOUTH DAILY 04/08/23   Little Ishikawa, MD  zolpidem (AMBIEN) 10 MG tablet Take 1 tablet (10 mg total) by mouth at bedtime as needed for sleep. New dose 05/20/23   Alex Sanes, MD    No current facility-administered medications for this encounter.   Current Outpatient Medications  Medication Sig Dispense Refill   amLODipine (NORVASC) 10 MG tablet TAKE 1 TABLET(10 MG) BY MOUTH DAILY 90 tablet 2   aspirin EC 81 MG tablet Take 1 tablet (81 mg total) by mouth daily. Swallow whole. 90 tablet 3   dapagliflozin propanediol (FARXIGA) 10 MG TABS tablet Take 1 tablet (10 mg total) by mouth daily before breakfast. 30 tablet 5   glucose blood test strip 1 each by Other route as needed. Use as instructed      Lancets (FREESTYLE) lancets 1 each by Other route 2 (two) times daily. Use to help check blood sugar twice a day. Dx E11.9 100 each 3   metFORMIN (GLUCOPHAGE) 1000 MG tablet Take 1 tablet (1,000 mg total) by mouth 2 (two) times daily with a meal. 180 tablet 1   metoprolol succinate (TOPROL-XL) 25 MG 24 hr tablet Take 1 tablet (25 mg total) by mouth daily. 90 tablet 2   ramipril (ALTACE) 10 MG capsule TAKE 1 CAPSULE(10 MG) BY MOUTH TWICE DAILY 180 capsule 3   rosuvastatin (CRESTOR) 20 MG tablet TAKE 1 TABLET(20 MG) BY MOUTH DAILY 90 tablet 3   zolpidem (AMBIEN) 10 MG tablet Take 1 tablet (10 mg total) by mouth at bedtime as needed for sleep. New dose 30 tablet 5    Allergies as of 08/14/2023 - Review Complete 08/14/2023  Allergen Reaction Noted   Oxycodone Anxiety 10/19/2014    Review of  Systems  Constitutional:  Negative for chills, fever and weight loss.  HENT:  Negative for hearing loss and tinnitus.   Eyes:  Negative for blurred vision and double vision.  Respiratory:  Negative for cough and hemoptysis.   Cardiovascular:  Negative for chest pain and palpitations.  Gastrointestinal:  Positive for abdominal pain and blood in stool. Negative for constipation, diarrhea, heartburn, melena, nausea and vomiting.  Genitourinary:  Negative for dysuria and urgency.  Musculoskeletal:  Negative for myalgias and neck pain.  Skin:  Negative for itching and rash.  Neurological:  Negative for seizures and loss of consciousness.  Psychiatric/Behavioral:  Negative for depression and suicidal ideas.  Physical Exam:  Vital signs in last 24 hours: Pulse Rate:  [63] 63 (08/28 1030) Resp:  [27] 27 (08/28 1030) BP: (177)/(69) 177/69 (08/28 1030) SpO2:  [100 %] 100 % (08/28 1030) Weight:  [93 kg] 93 kg (08/28 1030)   Last BM recorded by nurses in past 5 days No data recorded  Physical Exam Constitutional:      Appearance: Normal appearance.  HENT:     Head: Normocephalic and atraumatic.     Nose: Nose normal. No congestion.  Eyes:     General: No scleral icterus.    Extraocular Movements: Extraocular movements intact.  Cardiovascular:     Rate and Rhythm: Normal rate and regular rhythm.  Pulmonary:     Effort: Pulmonary effort is normal. No respiratory distress.  Abdominal:     General: There is no distension.     Palpations: Abdomen is soft. There is no mass.     Tenderness: There is no abdominal tenderness. There is no guarding or rebound.     Hernia: No hernia is present.  Genitourinary:    Comments: Multiple external hemorrhoids, nonthrombosed. One moderate to large - nontender. Dried blood around gluteal cleft Musculoskeletal:        General: No swelling. Normal range of motion.     Cervical back: Normal range of motion and neck supple.  Skin:    General: Skin  is warm and dry.  Neurological:     General: No focal deficit present.     Mental Status: He is alert and oriented to person, place, and time.  Psychiatric:        Mood and Affect: Mood normal.        Behavior: Behavior normal.        Thought Content: Thought content normal.        Judgment: Judgment normal.      LAB RESULTS: Recent Labs    08/14/23 1032 08/14/23 1100  WBC 10.4  --   HGB 13.7 12.9*  HCT 41.7 38.0*  PLT 180  --    BMET Recent Labs    08/14/23 1032 08/14/23 1100  NA 140 142  K 4.4 4.2  CL 108 108  CO2 19*  --   GLUCOSE 189* 181*  BUN 19 20  CREATININE 1.43* 1.40*  CALCIUM 9.5  --    LFT Recent Labs    08/14/23 1032  PROT 7.0  ALBUMIN 3.9  AST 26  ALT 21  ALKPHOS 62  BILITOT 0.6   PT/INR Recent Labs    08/14/23 1032  LABPROT 14.0  INR 1.1    STUDIES: No results found.    Impression    72 y.o. male with past medical history significant for CAD s/p CABG 08/2018, diabetes, CVA (on ASA), presents with acute onset lower abdominal pain and 1 episode of bright red rectal bleeding.  CTA significant for colitis in descending colon and possible torsed epiploic appendage.  Hemodynamically stable.  Lower GI bleed Abdominal pain CTA significant for colitis in descending colon and possible torsed epiploic appendage Hgb 12.9, MCV 88.2 BUN 19, creatinine 1.43 Lipase 30 PT 14/INR 1.1 Lactic acid 2.2 Suspect patient may have two separate conditions occurring or combination of the two. Likely ischemic colitis with presence of acute abdominal pain and rectal bleeding. No diarrhea or leukocytosis so less likely infectious etiology.  Additionally CT significant epiploic appendagitis which is likely causing his pain or leading to the surrounding inflammation in that area (?). Bleeding could also be  due to his hemorrhoids as well.   Plan   - Continue daily CBC and transfuse as needed to maintain HGB > 7  - Supportive care with NSAIDs (ibuprofen 600 Mg  every 8 hours for 4 to 6 days) in addition to pain control per primary team - Clear liquid diet for now  Thank you for your kind consultation, we will continue to follow.  Bayley Leanna Sato  08/14/2023, 12:08 PM

## 2023-08-14 NOTE — ED Notes (Signed)
 Discharge instructions reviewed with patient. Pt verbalized understanding and had no further questions. Pt a&ox4 and in no acute distress at time of discharge.

## 2023-08-15 ENCOUNTER — Telehealth: Payer: Self-pay

## 2023-08-15 NOTE — Telephone Encounter (Signed)
Inbound call from patient stating he is unable to come in 9/4 for appointment. Patient states he would be able to come in September 3rd or 5th, if available. Patient requesting a call back. Please advise, thank you.

## 2023-08-15 NOTE — Telephone Encounter (Signed)
Called and left patient a vm to follow up and see how he was feeling and to inform him of his follow up appt that is scheduled on 08/21/23 at 9:30 am with Willette Cluster, NP.

## 2023-08-15 NOTE — Telephone Encounter (Signed)
Thank you for the update.  I heard from Dr. Barron Alvine about this patient, and clinic follow-up as scheduled looks appropriate.  Call back in the interim with any problems that develop.  HD

## 2023-08-15 NOTE — Telephone Encounter (Signed)
-----   Message from Shellia Cleverly sent at 08/14/2023  2:03 PM EDT ----- Sharol Harness, I expect this will be discharged home from the ER today with a diagnosis of epiploic appendagitis and left-sided colitis.  Can you please call to check in on him tomorrow and see how he is doing.  He will need a close follow-up in the GI clinic with Dr. Myrtie Neither or one of the APP's.  Will also likely need repeat colonoscopy in 8+ weeks, but will defer that decision ultimately to Dr. Myrtie Neither. Thanks!  HD- FYI re: Mr Braaten that I also sent you a SecureChat message about.

## 2023-08-15 NOTE — Telephone Encounter (Signed)
Returned call to patient. Pt states that he is feeling better, pain is not as bad as it was. Still having some discomfort. No appts available on 9/3 or 9/5. Pt has been scheduled for a follow up with Hyacinth Meeker, PA-C on 09/02/23 at 10 am. Pt verbalized understanding and had no concerns at the end of the call.

## 2023-08-19 ENCOUNTER — Telehealth: Payer: Self-pay | Admitting: Gastroenterology

## 2023-08-19 NOTE — Telephone Encounter (Signed)
Alex Bryant called due to ongoing abdominal pain  States when he was seen in the hospital Wednesday his pain was 10/10. Was given scheduled motrin with percocet for breakthrough pain. States his pain improved to 6/10 and has been steady.  States he feels the pain if he moves around or stands up, pain improves when still or lying prone.  Reports bowel movements that have become loose and small volume. States last time he took narcotics he developed an obstruction. Passing gas. Denies bleeding.  Advised Alex Bryant to continue scheduled motrin. Add in miralax 1 capful daily and increase if needed. Increase water intake. If symptoms worsen, severe abdominal pain, not passing gas then proceed to ED.   Discussed his epiploic appendagitis is self limiting and treated with NSAIDs as he is doing now. Should improve within 14 days.

## 2023-08-20 NOTE — Telephone Encounter (Signed)
This has been addressed. See 9/2 telephone encounter for details

## 2023-08-21 ENCOUNTER — Ambulatory Visit: Payer: PPO | Admitting: Nurse Practitioner

## 2023-08-26 NOTE — Telephone Encounter (Signed)
Lm on mobile vm for patient to return call °

## 2023-08-26 NOTE — Telephone Encounter (Signed)
Please see a message from this patient that was answered by the PA a week ago while I was out of the office.  If this man is still not feeling well and would like to see me in clinic sooner than his appointment with Victorino Dike on 9/16, then I can see him at 4 PM tomorrow (08/27/2023), but my 4 PM telemedicine will need to be moved to 430.  HD

## 2023-08-28 NOTE — Telephone Encounter (Signed)
No return call received from patient, sooner appt has passed. September 04, 2023 appt still scheduled.

## 2023-08-29 ENCOUNTER — Telehealth: Payer: Self-pay

## 2023-08-29 NOTE — Telephone Encounter (Signed)
Transition Care Management Unsuccessful Follow-up Telephone Call  Date of discharge and from where:  [facility] 8/28  Attempts:  1st Attempt  Reason for unsuccessful TCM follow-up call:  No answer/busy   Lenard Forth [place]  [facility], [facility] Guide, Phone: [phone] Website: [email or web address]

## 2023-08-30 ENCOUNTER — Telehealth: Payer: Self-pay

## 2023-08-30 NOTE — Telephone Encounter (Signed)
Transition Care Management Unsuccessful Follow-up Telephone Call  Date of discharge and from where:  [facility] 8/28  Attempts:  2nd Attempt  Reason for unsuccessful TCM follow-up call:  No answer/busy   Lenard Forth [place]  [facility], [facility] Guide, Phone: [phone] Website: [email or web address]

## 2023-09-02 ENCOUNTER — Ambulatory Visit: Payer: PPO | Admitting: Physician Assistant

## 2023-09-24 ENCOUNTER — Other Ambulatory Visit: Payer: Self-pay | Admitting: Internal Medicine

## 2023-10-13 ENCOUNTER — Other Ambulatory Visit: Payer: Self-pay | Admitting: Internal Medicine

## 2023-10-21 NOTE — Progress Notes (Unsigned)
Cardiology Office Note:    Date:  10/23/2023   ID:  Alex Bryant, DOB [birth year], MRN [id number]  PCP:  Pincus Sanes, MD  Cardiologist:  Nanetta Batty, MD  Electrophysiologist:  None   Referring MD: Pincus Sanes, MD   Chief Complaint  Patient presents with   Follow-up    6 months.   Coronary Artery Disease    History of Present Illness:    Alex Bryant is a 72 y.o. male with a hx of CAD status post CABG 08/19/2018 (LIMA to LAD, SVG-ramus, SVG-OM1 and OM 2, SVG-PDA), T2DM, hypertension, hyperlipidemia, CVA who presents for follow-up.  He was referred by Dr. Lawerance Bach for evaluation of CAD, initially seen 04/16/2022.  Previously followed with Dr. Allyson Sabal, last seen in 2020.  He reports that he has been doing well.  Denies any chest pain, dyspnea, lightheadedness, syncope, lower extremity edema, or palpitations.  Walks 8000-10,000 steps per day.  Denies any exertional symptoms.  Taking Plavix, denies any bleeding issues.  Echocardiogram 10/2022 showed normal biventricular function, moderate left atrial enlargement, mild right atrial enlargement, no significant valvular disease.  Since last clinic visit, he reports he is doing well.  Denies any chest pain, dyspnea, lightheadedness, syncope,  or palpitations.  Reports he remains very active, golfs 1-2 times per week, walks 18 holes.  Typically can walk 10-20,000 steps per day.  Denies any exertional symptoms.  Reports some lightheadedness with standing but no syncope.  Continues to have some swelling in left ankle.   BP Readings from Last 3 Encounters:  10/23/23 118/60  08/14/23 (!) 149/66  04/24/23 116/60      Past Medical History:  Diagnosis Date   Allergy    Coronary artery disease    Diet-controlled diabetes mellitus (HCC)    AODM   History of gout    Hyperlipidemia    Hypertension    Progressive angina (HCC) 08/12/2018   Chest pain   Stroke (HCC) 11/19/2016   "fully recovered" (08/14/2018)   Tick bite 2001    Erlichiosis;Darlina Sicilian MD, ID    Past Surgical History:  Procedure Laterality Date   BACK SURGERY     COLONOSCOPY     COLONOSCOPY W/ POLYPECTOMY  2010; 2015   Dr Juanda Chance   CORONARY ARTERY BYPASS GRAFT N/A 08/19/2018   Procedure: CORONARY ARTERY BYPASS GRAFTING (CABG) x 5  USING LEFT INTERNAL MAMMARY ARTERY AND ENDOSCOPICALLY HARVESTED RIGHT SAPHENOUS VEIN.;  Surgeon: Kerin Perna, MD;  Location: [facility] OR;  Service: Open Heart Surgery;  Laterality: N/A;   EP IMPLANTABLE DEVICE N/A 11/21/2016   Procedure: Loop Recorder Insertion;  Surgeon: Hillis Range, MD;  Location: [facility] INVASIVE CV LAB;  Service: Cardiovascular;  Laterality: N/A;   ESI  2014   @L5 -S1 X3    EXCISIONAL HEMORRHOIDECTOMY  ~ 1995   FACIAL RECONSTRUCTION SURGERY Right 1990s   plating & pinning of maxilla   INGUINAL HERNIA REPAIR Bilateral 1959   LEFT HEART CATH AND CORONARY ANGIOGRAPHY N/A 08/14/2018   Procedure: LEFT HEART CATH AND CORONARY ANGIOGRAPHY;  Surgeon: Runell Gess, MD;  Location: [facility] INVASIVE CV LAB;  Service: Cardiovascular;  Laterality: N/A;   POLYPECTOMY     POSTERIOR LUMBAR FUSION  10/17/2013   L5-S1 ; [facility] , [place]   TEE WITHOUT CARDIOVERSION N/A 11/21/2016   Procedure: TRANSESOPHAGEAL ECHOCARDIOGRAM (TEE);  Surgeon: Laurey Morale, MD;  Location: [facility] ENDOSCOPY;  Service: Cardiovascular;  Laterality: N/A;   TEE WITHOUT CARDIOVERSION N/A 08/19/2018   Procedure:  TRANSESOPHAGEAL ECHOCARDIOGRAM (TEE);  Surgeon: Donata Clay, Theron Arista, MD;  Location: [facility] OR;  Service: Open Heart Surgery;  Laterality: N/A;    Current Medications: Current Meds  Medication Sig   amLODipine (NORVASC) 10 MG tablet TAKE 1 TABLET(10 MG) BY MOUTH DAILY   aspirin EC 81 MG tablet Take 1 tablet (81 mg total) by mouth daily. Swallow whole.   ciprofloxacin (CIPRO) 500 MG tablet Take 1 tablet (500 mg total) by mouth 2 (two) times daily.   FARXIGA 10 MG TABS tablet TAKE 1 TABLET(10 MG) BY MOUTH DAILY BEFORE BREAKFAST   glucose blood test strip  1 each by Other route as needed. Use as instructed    HYDROcodone-acetaminophen (NORCO/VICODIN) 5-325 MG tablet Take 1 tablet by mouth every 4 (four) hours as needed.   hydrocortisone (ANUSOL-HC) 2.5 % rectal cream Place 1 Application rectally 2 (two) times daily.   ibuprofen (ADVIL) 600 MG tablet Take 1 tablet (600 mg total) by mouth every 6 (six) hours.   Lancets (FREESTYLE) lancets 1 each by Other route 2 (two) times daily. Use to help check blood sugar twice a day. Dx E11.9   metFORMIN (GLUCOPHAGE) 1000 MG tablet TAKE 1 TABLET(1000 MG) BY MOUTH TWICE DAILY WITH A MEAL   metoprolol succinate (TOPROL-XL) 25 MG 24 hr tablet Take 1 tablet (25 mg total) by mouth daily.   metroNIDAZOLE (FLAGYL) 500 MG tablet Take 1 tablet (500 mg total) by mouth 2 (two) times daily.   pantoprazole (PROTONIX) 40 MG tablet Take 1 tablet (40 mg total) by mouth daily.   rosuvastatin (CRESTOR) 20 MG tablet TAKE 1 TABLET(20 MG) BY MOUTH DAILY   zolpidem (AMBIEN) 10 MG tablet Take 1 tablet (10 mg total) by mouth at bedtime as needed for sleep. New dose   [DISCONTINUED] ramipril (ALTACE) 10 MG capsule TAKE 1 CAPSULE(10 MG) BY MOUTH TWICE DAILY     Allergies:   Oxycodone   Social History   Socioeconomic History   Marital status: Married    Spouse name: Not on file   Number of children: 3   Years of education: Not on file   Highest education level: Bachelor's degree (e.g., BA, AB, BS)  Occupational History   Not on file  Tobacco Use   Smoking status: Former    Current packs/day: 0.00    Average packs/day: 0.3 packs/day for 6.0 years (1.5 ttl pk-yrs)    Types: Cigarettes    Start date: 12/17/1974    Quit date: 12/17/1980    Years since quitting: 42.8   Smokeless tobacco: Never   Tobacco comments:    smoked off and on 1966- 1982, up to < 5 cigarettes / day  Vaping Use   Vaping status: Never Used  Substance and Sexual Activity   Alcohol use: Yes    Alcohol/week: 4.0 standard drinks of alcohol    Types: 4  Glasses of wine per week    Comment:  socially   Drug use: Never   Sexual activity: Yes  Other Topics Concern   Not on file  Social History Narrative   Lives at home with wife    Right handed   Caffeine: decaf coffee   Social Determinants of Health   Financial Resource Strain: Low Risk  (04/22/2023)   Overall Financial Resource Strain (CARDIA)    Difficulty of Paying Living Expenses: Not hard at all  Food Insecurity: No Food Insecurity (04/22/2023)   Hunger Vital Sign    Worried About Running Out of Food in the Last  Year: Never true    Ran Out of Food in the Last Year: Never true  Transportation Needs: No Transportation Needs (04/22/2023)   PRAPARE - Administrator, Civil Service (Medical): No    Lack of Transportation (Non-Medical): No  Physical Activity: Sufficiently Active (04/22/2023)   Exercise Vital Sign    Days of Exercise per Week: 5 days    Minutes of Exercise per Session: 50 min  Stress: No Stress Concern Present (04/22/2023)   [employer] of Occupational Health - Occupational Stress Questionnaire    Feeling of Stress : Only a little  Social Connections: Socially Integrated (04/22/2023)   Social Connection and Isolation Panel [NHANES]    Frequency of Communication with Friends and Family: Patient declined    Frequency of Social Gatherings with Friends and Family: Three times a week    Attends Religious Services: More than 4 times per year    Active Member of Clubs or Organizations: Yes    Attends Engineer, structural: More than 4 times per year    Marital Status: Married     Family History: The patient's family history includes Diabetes in his sister; Heart attack (age of onset: 48) in his father; Hypertension in his mother; Stroke in his maternal uncle and mother. There is no history of Cancer, Colon cancer, Rectal cancer, Stomach cancer, Colon polyps, Esophageal cancer, Headache, or Migraines.  ROS:   Please see the history of present illness.      All other systems reviewed and are negative.  EKGs/Labs/Other Studies Reviewed:    The following studies were reviewed today:   EKG:   04/16/2022: Sinus rhythm, rate 63, PACs 10/22/22: Sinus bradycardia, rate 52, first-degree AV block 04/24/23: Sinus bradycardia, first degree AV block, rate 57 10/23/2023: Sinus bradycardia, rate 54  Recent Labs: 04/24/2023: Pro B Natriuretic peptide (BNP) 82.0 08/14/2023: ALT 21; BUN 20; Creatinine, Ser 1.40; Hemoglobin 12.9; Platelets 180; Potassium 4.2; Sodium 142  Recent Lipid Panel    Component Value Date/Time   CHOL 120 04/24/2023 1522   CHOL 137 10/22/2022 1058   CHOL 153 05/25/2015 0924   TRIG 256.0 (H) 04/24/2023 1522   TRIG 188 (H) 05/25/2015 0924   HDL 41.80 04/24/2023 1522   HDL 52 10/22/2022 1058   HDL 38 (L) 05/25/2015 0924   CHOLHDL 3 04/24/2023 1522   VLDL 51.2 (H) 04/24/2023 1522   LDLCALC 64 10/22/2022 1058   LDLCALC 77 05/25/2015 0924   LDLDIRECT 56.0 04/24/2023 1522    Physical Exam:    VS:  BP 118/60 (BP Location: Left Arm, Patient Position: Sitting, Cuff Size: Normal)   Pulse (!) 54   Ht 6' (1.829 m)   Wt 201 lb (91.2 kg)   BMI 27.26 kg/m     Wt Readings from Last 3 Encounters:  10/23/23 201 lb (91.2 kg)  08/14/23 205 lb (93 kg)  04/24/23 215 lb (97.5 kg)     GEN:  Well nourished, well developed in no acute distress HEENT: Normal NECK: No JVD; No carotid bruits LYMPHATICS: No lymphadenopathy CARDIAC: RRR, no murmurs, rubs, gallops RESPIRATORY:  Clear to auscultation without rales, wheezing or rhonchi  ABDOMEN: Soft, non-tender, non-distended MUSCULOSKELETAL:  1+ LLE edema; No deformity  SKIN: Warm and dry NEUROLOGIC:  Alert and oriented x 3 PSYCHIATRIC:  Normal affect   ASSESSMENT:    1. Coronary artery disease involving native coronary artery of native heart without angina pectoris   2. Cerebral thrombosis with cerebral infarction   3.  Essential hypertension   4. Lower leg edema   5. Hyperlipidemia,  unspecified hyperlipidemia type      PLAN:    CAD: status post CABG 08/19/2018 (LIMA to LAD, SVG-ramus, SVG-OM1 and OM 2, SVG-PDA).  Echocardiogram 10/2022 showed normal biventricular function, moderate left atrial enlargement, mild right atrial enlargement, no significant valvular disease. -Continue ASA 81 mg daily -Continue rosuvastatin 20 mg daily.  LDL 56 04/2023 -Continue toprol XL 25 mg daily  Hypertension: On metoprolol 25 mg daily and ramipril 10 mg twice daily and amlodipine 10 mg daily.  Appears controlled.  Check BMET  Hyperlipidemia: LDL 148 on 03/26/2022, had been off rosuvastatin.  Restarted rosuvastatin 20 mg daily, LDL 56 04/2023  Left lower extremity edema: Asymmetric 1+ left lower extremity edema.  Lower extremity duplex negative for DVT 04/2023.  Check left lower extremity duplex to rule out DVT.  Normal BNP, albumin.  Suspect due to amlodipine use.  Discussed reducing dose of amlodipine but he states it is not causing any discomfort and would prefer to stay on current dose for now  CVA: Continue ASA, statin  T2DM: On metformin and Farxiga.  A1c 8.0% on 04/24/23    RTC in 6 months    Medication Adjustments/Labs and Tests Ordered: Current medicines are reviewed at length with the patient today.  Concerns regarding medicines are outlined above.  Orders Placed This Encounter  Procedures   Basic Metabolic Panel (BMET)   Magnesium   EKG 12-Lead   Meds ordered this encounter  Medications   ramipril (ALTACE) 10 MG capsule    Sig: Take 1 capsule (10 mg total) by mouth daily.    Dispense:  180 capsule    Refill:  3    ZERO refills remain on this prescription. Your patient is requesting advance approval of refills for this medication to PREVENT ANY MISSED DOSES    Patient Instructions  Medication Instructions:  Ramipril refill script sent. *If you need a refill on your cardiac medications before your next appointment, please call your pharmacy*   Lab Work: BMET,  Magnesium today. If you have labs (blood work) drawn today and your tests are completely normal, you will receive your results only by: MyChart Message (if you have MyChart) OR A paper copy in the mail If you have any lab test that is abnormal or we need to change your treatment, we will call you to review the results.    Follow-Up: At [facility], you and your health needs are our priority.  As part of our continuing mission to provide you with exceptional heart care, we have created designated Provider Care Teams.  These Care Teams include your primary Cardiologist (physician) and Advanced Practice Providers (APPs -  Physician Assistants and Nurse Practitioners) who all work together to provide you with the care you need, when you need it.  We recommend signing up for the patient portal called "MyChart".  Sign up information is provided on this After Visit Summary.  MyChart is used to connect with patients for Virtual Visits (Telemedicine).  Patients are able to view lab/test results, encounter notes, upcoming appointments, etc.  Non-urgent messages can be sent to your provider as well.   To learn more about what you can do with MyChart, go to [email or web address].    Your next appointment:   6 month(s)  Provider:   Epifanio Lesches MD   Signed, Little Ishikawa, MD  10/23/2023 9:34 AM    [facility]

## 2023-10-23 ENCOUNTER — Ambulatory Visit: Payer: PPO | Attending: Cardiology | Admitting: Cardiology

## 2023-10-23 ENCOUNTER — Encounter: Payer: Self-pay | Admitting: Cardiology

## 2023-10-23 VITALS — BP 118/60 | HR 54 | Ht 72.0 in | Wt 201.0 lb

## 2023-10-23 DIAGNOSIS — R600 Localized edema: Secondary | ICD-10-CM | POA: Diagnosis not present

## 2023-10-23 DIAGNOSIS — I10 Essential (primary) hypertension: Secondary | ICD-10-CM

## 2023-10-23 DIAGNOSIS — I2510 Atherosclerotic heart disease of native coronary artery without angina pectoris: Secondary | ICD-10-CM | POA: Diagnosis not present

## 2023-10-23 DIAGNOSIS — I6330 Cerebral infarction due to thrombosis of unspecified cerebral artery: Secondary | ICD-10-CM | POA: Diagnosis not present

## 2023-10-23 DIAGNOSIS — E785 Hyperlipidemia, unspecified: Secondary | ICD-10-CM

## 2023-10-23 MED ORDER — RAMIPRIL 10 MG PO CAPS: 10.0000 mg | ORAL_CAPSULE | Freq: Every day | ORAL | 3 refills | Status: DC

## 2023-10-23 NOTE — Patient Instructions (Signed)
Medication Instructions:  Ramipril refill script sent. *If you need a refill on your cardiac medications before your next appointment, please call your pharmacy*   Lab Work: BMET, Magnesium today. If you have labs (blood work) drawn today and your tests are completely normal, you will receive your results only by: MyChart Message (if you have MyChart) OR A paper copy in the mail If you have any lab test that is abnormal or we need to change your treatment, we will call you to review the results.    Follow-Up: At [facility], you and your health needs are our priority.  As part of our continuing mission to provide you with exceptional heart care, we have created designated Provider Care Teams.  These Care Teams include your primary Cardiologist (physician) and Advanced Practice Providers (APPs -  Physician Assistants and Nurse Practitioners) who all work together to provide you with the care you need, when you need it.  We recommend signing up for the patient portal called "MyChart".  Sign up information is provided on this After Visit Summary.  MyChart is used to connect with patients for Virtual Visits (Telemedicine).  Patients are able to view lab/test results, encounter notes, upcoming appointments, etc.  Non-urgent messages can be sent to your provider as well.   To learn more about what you can do with MyChart, go to [email or web address].    Your next appointment:   6 month(s)  Provider:   Epifanio Lesches MD

## 2023-10-24 ENCOUNTER — Encounter: Payer: Self-pay | Admitting: Cardiology

## 2023-10-24 LAB — BASIC METABOLIC PANEL
BUN/Creatinine Ratio: 15 (ref 10–24)
BUN: 17 mg/dL (ref 8–27)
CO2: 23 mmol/L (ref 20–29)
Calcium: 9.4 mg/dL (ref 8.6–10.2)
Chloride: 106 mmol/L (ref 96–106)
Creatinine, Ser: 1.1 mg/dL (ref 0.76–1.27)
Glucose: 143 mg/dL — ABNORMAL HIGH (ref 70–99)
Potassium: 4.6 mmol/L (ref 3.5–5.2)
Sodium: 143 mmol/L (ref 134–144)
eGFR: 71 mL/min/{1.73_m2} (ref >59)

## 2023-10-24 LAB — MAGNESIUM: Magnesium: 1.9 mg/dL (ref 1.6–2.3)

## 2023-10-28 ENCOUNTER — Encounter: Payer: Self-pay | Admitting: Internal Medicine

## 2023-11-30 ENCOUNTER — Encounter: Payer: Self-pay | Admitting: Internal Medicine

## 2023-11-30 ENCOUNTER — Other Ambulatory Visit: Payer: Self-pay | Admitting: Internal Medicine

## 2023-12-17 DIAGNOSIS — J069 Acute upper respiratory infection, unspecified: Secondary | ICD-10-CM | POA: Diagnosis not present

## 2023-12-17 DIAGNOSIS — J209 Acute bronchitis, unspecified: Secondary | ICD-10-CM | POA: Diagnosis not present

## 2024-01-02 ENCOUNTER — Encounter: Payer: Self-pay | Admitting: Internal Medicine

## 2024-01-02 ENCOUNTER — Other Ambulatory Visit: Payer: Self-pay | Admitting: Cardiology

## 2024-01-02 NOTE — Patient Instructions (Addendum)
Blood work was ordered.       Medications changes include :   None       Return in about 6 months (around 07/02/2024) for follow up.   Health Maintenance, Male Adopting a healthy lifestyle and getting preventive care are important in promoting health and wellness. Ask your health care provider about: The right schedule for you to have regular tests and exams. Things you can do on your own to prevent diseases and keep yourself healthy. What should I know about diet, weight, and exercise? Eat a healthy diet  Eat a diet that includes plenty of vegetables, fruits, low-fat dairy products, and lean protein. Do not eat a lot of foods that are high in solid fats, added sugars, or sodium. Maintain a healthy weight Body mass index (BMI) is a measurement that can be used to identify possible weight problems. It estimates body fat based on height and weight. Your health care provider can help determine your BMI and help you achieve or maintain a healthy weight. Get regular exercise Get regular exercise. This is one of the most important things you can do for your health. Most adults should: Exercise for at least 150 minutes each week. The exercise should increase your heart rate and make you sweat (moderate-intensity exercise). Do strengthening exercises at least twice a week. This is in addition to the moderate-intensity exercise. Spend less time sitting. Even light physical activity can be beneficial. Watch cholesterol and blood lipids Have your blood tested for lipids and cholesterol at 75 years of age, then have this test every 5 years. You may need to have your cholesterol levels checked more often if: Your lipid or cholesterol levels are high. You are older than 75 years of age. You are at high risk for heart disease. What should I know about cancer screening? Many types of cancers can be detected early and may often be prevented. Depending on your health history and family  history, you may need to have cancer screening at various ages. This may include screening for: Colorectal cancer. Prostate cancer. Skin cancer. Lung cancer. What should I know about heart disease, diabetes, and high blood pressure? Blood pressure and heart disease High blood pressure causes heart disease and increases the risk of stroke. This is more likely to develop in people who have high blood pressure readings or are overweight. Talk with your health care provider about your target blood pressure readings. Have your blood pressure checked: Every 3-5 years if you are 39-58 years of age. Every year if you are 48 years old or older. If you are between the ages of 46 and 24 and are a current or former smoker, ask your health care provider if you should have a one-time screening for abdominal aortic aneurysm (AAA). Diabetes Have regular diabetes screenings. This checks your fasting blood sugar level. Have the screening done: Once every three years after age 93 if you are at a normal weight and have a low risk for diabetes. More often and at a younger age if you are overweight or have a high risk for diabetes. What should I know about preventing infection? Hepatitis B If you have a higher risk for hepatitis B, you should be screened for this virus. Talk with your health care provider to find out if you are at risk for hepatitis B infection. Hepatitis C Blood testing is recommended for: Everyone born from [birth year] through 1967. Anyone with known risk factors for hepatitis C.  Sexually transmitted infections (STIs) You should be screened each year for STIs, including gonorrhea and chlamydia, if: You are sexually active and are younger than 75 years of age. You are older than 75 years of age and your health care provider tells you that you are at risk for this type of infection. Your sexual activity has changed since you were last screened, and you are at increased risk for chlamydia or  gonorrhea. Ask your health care provider if you are at risk. Ask your health care provider about whether you are at high risk for HIV. Your health care provider may recommend a prescription medicine to help prevent HIV infection. If you choose to take medicine to prevent HIV, you should first get tested for HIV. You should then be tested every 3 months for as long as you are taking the medicine. Follow these instructions at home: Alcohol use Do not drink alcohol if your health care provider tells you not to drink. If you drink alcohol: Limit how much you have to 0-2 drinks a day. Know how much alcohol is in your drink. In the [place] one drink equals one 12 oz bottle of beer (355 mL), one 5 oz glass of wine (148 mL), or one 1 oz glass of hard liquor (44 mL). Lifestyle Do not use any products that contain nicotine or tobacco. These products include cigarettes, chewing tobacco, and vaping devices, such as e-cigarettes. If you need help quitting, ask your health care provider. Do not use street drugs. Do not share needles. Ask your health care provider for help if you need support or information about quitting drugs. General instructions Schedule regular health, dental, and eye exams. Stay current with your vaccines. Tell your health care provider if: You often feel depressed. You have ever been abused or do not feel safe at home. Summary Adopting a healthy lifestyle and getting preventive care are important in promoting health and wellness. Follow your health care provider's instructions about healthy diet, exercising, and getting tested or screened for diseases. Follow your health care provider's instructions on monitoring your cholesterol and blood pressure. This information is not intended to replace advice given to you by your health care provider. Make sure you discuss any questions you have with your health care provider. Document Revised: 04/24/2021 Document Reviewed: 04/24/2021 Elsevier  Patient Education  2024 [employer].

## 2024-01-02 NOTE — Progress Notes (Signed)
Subjective:    Patient ID: Alex Bryant, male    DOB: 10-03-1951, 73 y.o.   MRN: [id number]     HPI Alex Bryant is here for a physical exam and his chronic medical problems.   Denies any changes in his health.  Overall feels well and has no concerns.   Medications and allergies reviewed with patient and updated if appropriate.  Current Outpatient Medications on File Prior to Visit  Medication Sig Dispense Refill   allopurinol (ZYLOPRIM) 300 MG tablet Take 300 mg by mouth daily.     amLODipine (NORVASC) 10 MG tablet TAKE 1 TABLET(10 MG) BY MOUTH DAILY 90 tablet 2   aspirin EC 81 MG tablet Take 1 tablet (81 mg total) by mouth daily. Swallow whole. 90 tablet 3   celecoxib (CELEBREX) 200 MG capsule Take 200 mg by mouth daily.     FARXIGA 10 MG TABS tablet TAKE 1 TABLET(10 MG) BY MOUTH DAILY BEFORE BREAKFAST 30 tablet 5   fluticasone (FLONASE) 50 MCG/ACT nasal spray      glucose blood test strip 1 each by Other route as needed. Use as instructed      hydrocortisone (ANUSOL-HC) 2.5 % rectal cream Place 1 Application rectally 2 (two) times daily. 30 g 0   ibuprofen (ADVIL) 600 MG tablet Take 1 tablet (600 mg total) by mouth every 6 (six) hours. 30 tablet 0   Lancets (FREESTYLE) lancets 1 each by Other route 2 (two) times daily. Use to help check blood sugar twice a day. Dx E11.9 100 each 3   metFORMIN (GLUCOPHAGE) 1000 MG tablet TAKE 1 TABLET(1000 MG) BY MOUTH TWICE DAILY WITH A MEAL 180 tablet 1   metoprolol succinate (TOPROL-XL) 25 MG 24 hr tablet Take 1 tablet (25 mg total) by mouth daily. 90 tablet 2   pantoprazole (PROTONIX) 40 MG tablet Take 1 tablet (40 mg total) by mouth daily. 30 tablet 0   ramipril (ALTACE) 10 MG capsule Take 1 capsule (10 mg total) by mouth daily. 180 capsule 3   rosuvastatin (CRESTOR) 20 MG tablet TAKE 1 TABLET(20 MG) BY MOUTH DAILY 90 tablet 3   zolpidem (AMBIEN) 10 MG tablet TAKE 1 TABLET(10 MG) BY MOUTH AT BEDTIME AS NEEDED FOR SLEEP 30 tablet 0   No  current facility-administered medications on file prior to visit.    Review of Systems  Constitutional:  Negative for fever.  Eyes:  Negative for visual disturbance.  Respiratory:  Negative for cough, shortness of breath and wheezing.   Cardiovascular:  Negative for chest pain, palpitations and leg swelling.  Gastrointestinal:  Negative for abdominal pain, blood in stool, constipation and diarrhea.       No gerd  Genitourinary:  Negative for difficulty urinating and dysuria.  Musculoskeletal:  Positive for arthralgias (mild). Negative for back pain.  Skin:  Negative for rash.  Neurological:  Negative for light-headedness and headaches.  Psychiatric/Behavioral:  Negative for dysphoric mood. The patient is not nervous/anxious.        Objective:   Vitals:   01/03/24 1006  BP: 112/60  Pulse: 60  Temp: 98 F (36.7 C)  SpO2: 98%   Filed Weights   01/03/24 1006  Weight: 197 lb (89.4 kg)   Body mass index is 26.72 kg/m.  BP Readings from Last 3 Encounters:  01/03/24 112/60  10/23/23 118/60  08/14/23 (!) 149/66    Wt Readings from Last 3 Encounters:  01/03/24 197 lb (89.4 kg)  10/23/23 201 lb (91.2 kg)  08/14/23 205 lb (93 kg)      Physical Exam Constitutional: He appears well-developed and well-nourished. No distress.  HENT:  Head: Normocephalic and atraumatic.  Right Ear: External ear normal.  Left Ear: External ear normal.  Normal ear canals and TM b/l  Mouth/Throat: Oropharynx is clear and moist. Eyes: Conjunctivae and EOM are normal.  Neck: Neck supple. No tracheal deviation present. No thyromegaly present.  No carotid bruit  Cardiovascular: Normal rate, regular rhythm, normal heart sounds and intact distal pulses.   No murmur heard.  No lower extremity edema. Pulmonary/Chest: Effort normal and breath sounds normal. No respiratory distress. He has no wheezes. He has no rales.  Abdominal: Soft. He exhibits no distension. There is no tenderness.   Genitourinary: deferred  Lymphadenopathy:   He has no cervical adenopathy.  Skin: Skin is warm and dry. He is not diaphoretic.  Psychiatric: He has a normal mood and affect. His behavior is normal.    Diabetic Foot Exam - Simple   Simple Foot Form Diabetic Foot exam was performed with the following findings: Yes 01/03/2024 10:29 AM  Visual Inspection No deformities, no ulcerations, no other skin breakdown bilaterally: Yes Sensation Testing Intact to touch and monofilament testing bilaterally: Yes Pulse Check Posterior Tibialis and Dorsalis pulse intact bilaterally: Yes Comments Bunion left foot, dry skin         Assessment & Plan:   Physical exam: Screening blood work  ordered Exercise  walking 20-30 miles a week, golf    Weight  is goo d- has lost a little weight - eating a little better Substance abuse   none   Reviewed recommended immunizations.   Health Maintenance  Topic Date Due   Colonoscopy  01/18/2023   HEMOGLOBIN A1C  10/25/2023   OPHTHALMOLOGY EXAM  12/20/2023   COVID-19 Vaccine (4 - 2024-25 season) 01/19/2024 (Originally 08/18/2023)   Medicare Annual Wellness (AWV)  02/03/2024 (Originally 06/13/2023)   INFLUENZA VACCINE  03/16/2024 (Originally 07/18/2023)   DTaP/Tdap/Td (1 - Tdap) 01/02/2025 (Originally 11/20/1970)   Diabetic kidney evaluation - Urine ACR  04/23/2024   Diabetic kidney evaluation - eGFR measurement  10/22/2024   FOOT EXAM  01/02/2025   Pneumonia Vaccine 58+ Years old  Completed   Hepatitis C Screening  Completed   Zoster Vaccines- Shingrix  Completed   HPV VACCINES  Aged Out    Discussed repeat colonoscopy.  He is not sure if he wants to have 1.  Last colonoscopy 2021.  Discussed that polyps removed at that time were precancerous and that means he has possibility of developing other precancerous polyps in the next several years that could become cancer.  He will think about if he wants to have a colonoscopy and will contact GI to schedule if  he does decide to have it   See Problem List for Assessment and Plan of chronic medical problems.

## 2024-01-03 ENCOUNTER — Ambulatory Visit (INDEPENDENT_AMBULATORY_CARE_PROVIDER_SITE_OTHER): Payer: PPO | Admitting: Internal Medicine

## 2024-01-03 VITALS — BP 112/60 | HR 60 | Temp 98.0°F | Ht 72.0 in | Wt 197.0 lb

## 2024-01-03 DIAGNOSIS — G479 Sleep disorder, unspecified: Secondary | ICD-10-CM | POA: Diagnosis not present

## 2024-01-03 DIAGNOSIS — Z0000 Encounter for general adult medical examination without abnormal findings: Secondary | ICD-10-CM | POA: Diagnosis not present

## 2024-01-03 DIAGNOSIS — E538 Deficiency of other specified B group vitamins: Secondary | ICD-10-CM

## 2024-01-03 DIAGNOSIS — I2510 Atherosclerotic heart disease of native coronary artery without angina pectoris: Secondary | ICD-10-CM | POA: Diagnosis not present

## 2024-01-03 DIAGNOSIS — E1159 Type 2 diabetes mellitus with other circulatory complications: Secondary | ICD-10-CM

## 2024-01-03 DIAGNOSIS — E782 Mixed hyperlipidemia: Secondary | ICD-10-CM | POA: Diagnosis not present

## 2024-01-03 DIAGNOSIS — I10 Essential (primary) hypertension: Secondary | ICD-10-CM | POA: Diagnosis not present

## 2024-01-03 DIAGNOSIS — I63413 Cerebral infarction due to embolism of bilateral middle cerebral arteries: Secondary | ICD-10-CM

## 2024-01-03 DIAGNOSIS — E1142 Type 2 diabetes mellitus with diabetic polyneuropathy: Secondary | ICD-10-CM

## 2024-01-03 LAB — TSH: TSH: 1.3 u[IU]/mL (ref 0.35–5.50)

## 2024-01-03 LAB — CBC WITH DIFFERENTIAL/PLATELET
Basophils Absolute: 0.1 10*3/uL (ref 0.0–0.1)
Basophils Relative: 0.9 % (ref 0.0–3.0)
Eosinophils Absolute: 0.4 10*3/uL (ref 0.0–0.7)
Eosinophils Relative: 6 % — ABNORMAL HIGH (ref 0.0–5.0)
HCT: 40.1 % (ref 39.0–52.0)
Hemoglobin: 13.5 g/dL (ref 13.0–17.0)
Lymphocytes Relative: 20.4 % (ref 12.0–46.0)
Lymphs Abs: 1.4 10*3/uL (ref 0.7–4.0)
MCHC: 33.8 g/dL (ref 30.0–36.0)
MCV: 88.6 fL (ref 78.0–100.0)
Monocytes Absolute: 0.5 10*3/uL (ref 0.1–1.0)
Monocytes Relative: 6.7 % (ref 3.0–12.0)
Neutro Abs: 4.6 10*3/uL (ref 1.4–7.7)
Neutrophils Relative %: 66 % (ref 43.0–77.0)
Platelets: 280 10*3/uL (ref 150.0–400.0)
RBC: 4.53 Mil/uL (ref 4.22–5.81)
RDW: 14.8 % (ref 11.5–15.5)
WBC: 6.9 10*3/uL (ref 4.0–10.5)

## 2024-01-03 LAB — LIPID PANEL
Cholesterol: 128 mg/dL (ref 0–200)
HDL: 41.5 mg/dL (ref >39.00)
LDL Cholesterol: 50 mg/dL (ref 0–99)
NonHDL: 86.05
Total CHOL/HDL Ratio: 3
Triglycerides: 182 mg/dL — ABNORMAL HIGH (ref 0.0–149.0)
VLDL: 36.4 mg/dL (ref 0.0–40.0)

## 2024-01-03 LAB — COMPREHENSIVE METABOLIC PANEL
ALT: 14 U/L (ref 0–53)
AST: 14 U/L (ref 0–37)
Albumin: 4.4 g/dL (ref 3.5–5.2)
Alkaline Phosphatase: 65 U/L (ref 39–117)
BUN: 23 mg/dL (ref 6–23)
CO2: 27 meq/L (ref 19–32)
Calcium: 9.3 mg/dL (ref 8.4–10.5)
Chloride: 104 meq/L (ref 96–112)
Creatinine, Ser: 1.22 mg/dL (ref 0.40–1.50)
GFR: 58.56 mL/min — ABNORMAL LOW (ref >60.00)
Glucose, Bld: 194 mg/dL — ABNORMAL HIGH (ref 70–99)
Potassium: 4.3 meq/L (ref 3.5–5.1)
Sodium: 140 meq/L (ref 135–145)
Total Bilirubin: 0.6 mg/dL (ref 0.2–1.2)
Total Protein: 7.2 g/dL (ref 6.0–8.3)

## 2024-01-03 LAB — HEMOGLOBIN A1C: Hgb A1c MFr Bld: 7.6 % — ABNORMAL HIGH (ref 4.6–6.5)

## 2024-01-03 LAB — VITAMIN B12: Vitamin B-12: 226 pg/mL (ref 211–911)

## 2024-01-03 MED ORDER — ZOLPIDEM TARTRATE 10 MG PO TABS: 10.0000 mg | ORAL_TABLET | Freq: Every day | ORAL | 5 refills | Status: DC

## 2024-01-03 MED ORDER — CELECOXIB 200 MG PO CAPS: 200.0000 mg | ORAL_CAPSULE | Freq: Every day | ORAL | 5 refills | Status: DC

## 2024-01-03 NOTE — Assessment & Plan Note (Signed)
Chronic Following with cardiology  No concerning symptoms to suggest angina Continue ASA 81 mg, metoprolol 25 mg xl daily, ramipril 10 mg twice daily, crestor 20 mg daily Stressed good sugar control

## 2024-01-03 NOTE — Assessment & Plan Note (Signed)
Chronic Check level 

## 2024-01-03 NOTE — Assessment & Plan Note (Signed)
Chronic Check lipid panel, cmp, tsh Continue crestor 20 mg daily Regular exercise and healthy diet encouraged

## 2024-01-03 NOTE — Assessment & Plan Note (Addendum)
Chronic Ambien helps him fall asleep Continue zolpidem 5 mg nightly as needed

## 2024-01-03 NOTE — Assessment & Plan Note (Signed)
Chronic Gabapentin was not effective Has numbness and this is tolerable No treatment at this time

## 2024-01-03 NOTE — Assessment & Plan Note (Signed)
Chronic Blood pressure well controlled CMP, cbc Continue metoprolol 25 mg xl daily, ramipril 10 mg twice daily, amlodipine 10 mg daily

## 2024-01-03 NOTE — Assessment & Plan Note (Addendum)
Chronic  Lab Results  Component Value Date   HGBA1C 8.0 (H) 04/24/2023   Sugars not ideally controlled last year Check A1c Continue metformin 1000 mg bid, farxiga 10 mg daily Stressed regular exercise, diabetic diet

## 2024-01-05 ENCOUNTER — Encounter: Payer: Self-pay | Admitting: Internal Medicine

## 2024-01-09 MED ORDER — GLIMEPIRIDE 2 MG PO TABS: 2.0000 mg | ORAL_TABLET | Freq: Every day | ORAL | 5 refills | Status: DC

## 2024-01-15 ENCOUNTER — Encounter: Payer: Self-pay | Admitting: Internal Medicine

## 2024-01-15 DIAGNOSIS — E119 Type 2 diabetes mellitus without complications: Secondary | ICD-10-CM | POA: Diagnosis not present

## 2024-01-15 DIAGNOSIS — H2513 Age-related nuclear cataract, bilateral: Secondary | ICD-10-CM | POA: Diagnosis not present

## 2024-01-15 DIAGNOSIS — H5203 Hypermetropia, bilateral: Secondary | ICD-10-CM | POA: Diagnosis not present

## 2024-01-15 LAB — HM DIABETES EYE EXAM

## 2024-01-21 DIAGNOSIS — L570 Actinic keratosis: Secondary | ICD-10-CM | POA: Diagnosis not present

## 2024-01-21 DIAGNOSIS — L820 Inflamed seborrheic keratosis: Secondary | ICD-10-CM | POA: Diagnosis not present

## 2024-01-21 DIAGNOSIS — D1801 Hemangioma of skin and subcutaneous tissue: Secondary | ICD-10-CM | POA: Diagnosis not present

## 2024-01-21 DIAGNOSIS — L821 Other seborrheic keratosis: Secondary | ICD-10-CM | POA: Diagnosis not present

## 2024-01-21 DIAGNOSIS — Z85828 Personal history of other malignant neoplasm of skin: Secondary | ICD-10-CM | POA: Diagnosis not present

## 2024-01-30 ENCOUNTER — Other Ambulatory Visit: Payer: Self-pay | Admitting: Internal Medicine

## 2024-02-25 ENCOUNTER — Other Ambulatory Visit: Payer: Self-pay | Admitting: Internal Medicine

## 2024-02-25 MED ORDER — ZOLPIDEM TARTRATE 10 MG PO TABS: ORAL_TABLET | ORAL | 5 refills | Status: DC

## 2024-03-31 DIAGNOSIS — M545 Low back pain, unspecified: Secondary | ICD-10-CM | POA: Diagnosis not present

## 2024-04-10 ENCOUNTER — Other Ambulatory Visit: Payer: Self-pay | Admitting: Internal Medicine

## 2024-04-13 DIAGNOSIS — M545 Low back pain, unspecified: Secondary | ICD-10-CM | POA: Diagnosis not present

## 2024-04-20 DIAGNOSIS — M545 Low back pain, unspecified: Secondary | ICD-10-CM | POA: Diagnosis not present

## 2024-04-21 ENCOUNTER — Other Ambulatory Visit: Payer: Self-pay | Admitting: Internal Medicine

## 2024-04-22 DIAGNOSIS — M9902 Segmental and somatic dysfunction of thoracic region: Secondary | ICD-10-CM | POA: Diagnosis not present

## 2024-04-22 DIAGNOSIS — M9903 Segmental and somatic dysfunction of lumbar region: Secondary | ICD-10-CM | POA: Diagnosis not present

## 2024-04-22 DIAGNOSIS — M9904 Segmental and somatic dysfunction of sacral region: Secondary | ICD-10-CM | POA: Diagnosis not present

## 2024-04-22 DIAGNOSIS — G6182 Multifocal motor neuropathy: Secondary | ICD-10-CM | POA: Diagnosis not present

## 2024-04-22 DIAGNOSIS — M9906 Segmental and somatic dysfunction of lower extremity: Secondary | ICD-10-CM | POA: Diagnosis not present

## 2024-04-22 DIAGNOSIS — M9905 Segmental and somatic dysfunction of pelvic region: Secondary | ICD-10-CM | POA: Diagnosis not present

## 2024-04-22 DIAGNOSIS — M5451 Vertebrogenic low back pain: Secondary | ICD-10-CM | POA: Diagnosis not present

## 2024-04-22 DIAGNOSIS — M9908 Segmental and somatic dysfunction of rib cage: Secondary | ICD-10-CM | POA: Diagnosis not present

## 2024-04-22 DIAGNOSIS — G909 Disorder of the autonomic nervous system, unspecified: Secondary | ICD-10-CM | POA: Diagnosis not present

## 2024-04-24 ENCOUNTER — Other Ambulatory Visit: Payer: Self-pay | Admitting: Cardiology

## 2024-05-13 DIAGNOSIS — M9903 Segmental and somatic dysfunction of lumbar region: Secondary | ICD-10-CM | POA: Diagnosis not present

## 2024-05-13 DIAGNOSIS — M9904 Segmental and somatic dysfunction of sacral region: Secondary | ICD-10-CM | POA: Diagnosis not present

## 2024-05-13 DIAGNOSIS — M5451 Vertebrogenic low back pain: Secondary | ICD-10-CM | POA: Diagnosis not present

## 2024-05-13 DIAGNOSIS — M9905 Segmental and somatic dysfunction of pelvic region: Secondary | ICD-10-CM | POA: Diagnosis not present

## 2024-05-13 DIAGNOSIS — M9902 Segmental and somatic dysfunction of thoracic region: Secondary | ICD-10-CM | POA: Diagnosis not present

## 2024-05-13 DIAGNOSIS — M546 Pain in thoracic spine: Secondary | ICD-10-CM | POA: Diagnosis not present

## 2024-05-13 DIAGNOSIS — M9906 Segmental and somatic dysfunction of lower extremity: Secondary | ICD-10-CM | POA: Diagnosis not present

## 2024-05-13 DIAGNOSIS — M9908 Segmental and somatic dysfunction of rib cage: Secondary | ICD-10-CM | POA: Diagnosis not present

## 2024-05-13 DIAGNOSIS — M25551 Pain in right hip: Secondary | ICD-10-CM | POA: Diagnosis not present

## 2024-05-18 DIAGNOSIS — M545 Low back pain, unspecified: Secondary | ICD-10-CM | POA: Diagnosis not present

## 2024-06-10 DIAGNOSIS — G6182 Multifocal motor neuropathy: Secondary | ICD-10-CM | POA: Diagnosis not present

## 2024-06-10 DIAGNOSIS — M5451 Vertebrogenic low back pain: Secondary | ICD-10-CM | POA: Diagnosis not present

## 2024-06-10 DIAGNOSIS — M9906 Segmental and somatic dysfunction of lower extremity: Secondary | ICD-10-CM | POA: Diagnosis not present

## 2024-06-10 DIAGNOSIS — M9903 Segmental and somatic dysfunction of lumbar region: Secondary | ICD-10-CM | POA: Diagnosis not present

## 2024-06-10 DIAGNOSIS — M9904 Segmental and somatic dysfunction of sacral region: Secondary | ICD-10-CM | POA: Diagnosis not present

## 2024-06-10 DIAGNOSIS — M9905 Segmental and somatic dysfunction of pelvic region: Secondary | ICD-10-CM | POA: Diagnosis not present

## 2024-06-10 DIAGNOSIS — M9908 Segmental and somatic dysfunction of rib cage: Secondary | ICD-10-CM | POA: Diagnosis not present

## 2024-06-14 ENCOUNTER — Other Ambulatory Visit: Payer: Self-pay | Admitting: Internal Medicine

## 2024-07-06 ENCOUNTER — Other Ambulatory Visit: Payer: Self-pay | Admitting: Cardiology

## 2024-07-15 DIAGNOSIS — M9902 Segmental and somatic dysfunction of thoracic region: Secondary | ICD-10-CM | POA: Diagnosis not present

## 2024-07-15 DIAGNOSIS — M542 Cervicalgia: Secondary | ICD-10-CM | POA: Diagnosis not present

## 2024-07-15 DIAGNOSIS — M25511 Pain in right shoulder: Secondary | ICD-10-CM | POA: Diagnosis not present

## 2024-07-15 DIAGNOSIS — M546 Pain in thoracic spine: Secondary | ICD-10-CM | POA: Diagnosis not present

## 2024-07-15 DIAGNOSIS — M9908 Segmental and somatic dysfunction of rib cage: Secondary | ICD-10-CM | POA: Diagnosis not present

## 2024-07-15 DIAGNOSIS — M9907 Segmental and somatic dysfunction of upper extremity: Secondary | ICD-10-CM | POA: Diagnosis not present

## 2024-07-15 DIAGNOSIS — M9901 Segmental and somatic dysfunction of cervical region: Secondary | ICD-10-CM | POA: Diagnosis not present

## 2024-07-25 ENCOUNTER — Other Ambulatory Visit: Payer: Self-pay | Admitting: Internal Medicine

## 2024-08-25 ENCOUNTER — Other Ambulatory Visit: Payer: Self-pay | Admitting: Internal Medicine

## 2024-08-27 DIAGNOSIS — M9904 Segmental and somatic dysfunction of sacral region: Secondary | ICD-10-CM | POA: Diagnosis not present

## 2024-08-27 DIAGNOSIS — M9907 Segmental and somatic dysfunction of upper extremity: Secondary | ICD-10-CM | POA: Diagnosis not present

## 2024-08-27 DIAGNOSIS — M47816 Spondylosis without myelopathy or radiculopathy, lumbar region: Secondary | ICD-10-CM | POA: Diagnosis not present

## 2024-08-27 DIAGNOSIS — M542 Cervicalgia: Secondary | ICD-10-CM | POA: Diagnosis not present

## 2024-08-27 DIAGNOSIS — M9903 Segmental and somatic dysfunction of lumbar region: Secondary | ICD-10-CM | POA: Diagnosis not present

## 2024-08-27 DIAGNOSIS — M545 Low back pain, unspecified: Secondary | ICD-10-CM | POA: Diagnosis not present

## 2024-08-27 DIAGNOSIS — M9905 Segmental and somatic dysfunction of pelvic region: Secondary | ICD-10-CM | POA: Diagnosis not present

## 2024-08-27 DIAGNOSIS — M9901 Segmental and somatic dysfunction of cervical region: Secondary | ICD-10-CM | POA: Diagnosis not present

## 2024-08-27 DIAGNOSIS — M9902 Segmental and somatic dysfunction of thoracic region: Secondary | ICD-10-CM | POA: Diagnosis not present

## 2024-08-27 DIAGNOSIS — M9908 Segmental and somatic dysfunction of rib cage: Secondary | ICD-10-CM | POA: Diagnosis not present

## 2024-08-31 NOTE — Assessment & Plan Note (Addendum)
 Chronic Associated with CAD, hx of CVA, peripheral neuropathy, hyperlipidemia  Lab Results  Component Value Date   HGBA1C 7.6 (H) 01/03/2024   Sugars not ideally controlled last year Check A1c, urine albumin /creatinine ratio Continue metformin  1000 mg bid, farxiga  10 mg daily, glimepiride  2 mg with breakfast Stressed regular exercise, diabetic diet

## 2024-08-31 NOTE — Progress Notes (Unsigned)
 Subjective:    Patient ID: Alex Bryant, male    DOB: Apr 20, 1951, 73 y.o.   MRN: [id number]     HPI Rik is here for follow up of his chronic medical problems.  No concerns.    He is exercising regularly.   He walks and plays golf.    Medications and allergies reviewed with patient and updated if appropriate.  Current Outpatient Medications on File Prior to Visit  Medication Sig Dispense Refill   allopurinol (ZYLOPRIM) 300 MG tablet Take 300 mg by mouth daily.     amLODipine  (NORVASC ) 10 MG tablet Take 1 tablet (10 mg total) by mouth daily. 90 tablet 1   aspirin  EC 81 MG tablet Take 1 tablet (81 mg total) by mouth daily. Swallow whole. 90 tablet 3   celecoxib  (CELEBREX ) 200 MG capsule TAKE 1 CAPSULE(200 MG) BY MOUTH DAILY 90 capsule 1   FARXIGA  10 MG TABS tablet TAKE 1 TABLET(10 MG) BY MOUTH DAILY BEFORE BREAKFAST 30 tablet 5   fluticasone (FLONASE) 50 MCG/ACT nasal spray      glimepiride  (AMARYL ) 2 MG tablet TAKE 1 TABLET(2 MG) BY MOUTH DAILY BEFORE BREAKFAST 30 tablet 5   glucose blood test strip 1 each by Other route as needed. Use as instructed      hydrocortisone  (ANUSOL -HC) 2.5 % rectal cream Place 1 Application rectally 2 (two) times daily. 30 g 0   ibuprofen  (ADVIL ) 600 MG tablet Take 1 tablet (600 mg total) by mouth every 6 (six) hours. 30 tablet 0   Lancets (FREESTYLE) lancets 1 each by Other route 2 (two) times daily. Use to help check blood sugar twice a day. Dx E11.9 100 each 3   metFORMIN  (GLUCOPHAGE ) 1000 MG tablet TAKE 1 TABLET(1000 MG) BY MOUTH TWICE DAILY WITH A MEAL 180 tablet 1   metoprolol  succinate (TOPROL -XL) 25 MG 24 hr tablet TAKE 1 TABLET(25 MG) BY MOUTH DAILY 90 tablet 1   pantoprazole  (PROTONIX ) 40 MG tablet Take 1 tablet (40 mg total) by mouth daily. 30 tablet 0   ramipril  (ALTACE ) 10 MG capsule Take 1 capsule (10 mg total) by mouth daily. 180 capsule 3   rosuvastatin  (CRESTOR ) 20 MG tablet TAKE 1 TABLET(20 MG) BY MOUTH DAILY 90 tablet 3    zolpidem  (AMBIEN ) 10 MG tablet TAKE 1 TABLET(10 MG) BY MOUTH AT BEDTIME AS NEEDED FOR SLEEP. DUE FOR FOLLOW UP 15 tablet 0   No current facility-administered medications on file prior to visit.     Review of Systems  Constitutional:  Negative for fever.  Respiratory:  Negative for cough, shortness of breath and wheezing.   Cardiovascular:  Negative for chest pain, palpitations and leg swelling.  Neurological:  Positive for headaches (occ). Negative for dizziness (with quick movements), light-headedness and numbness.       Objective:   Vitals:   09/01/24 0824  BP: 122/60  Pulse: 64  Temp: 98 F (36.7 C)  SpO2: 94%   BP Readings from Last 3 Encounters:  09/01/24 122/60  01/03/24 112/60  10/23/23 118/60   Wt Readings from Last 3 Encounters:  09/01/24 202 lb (91.6 kg)  01/03/24 197 lb (89.4 kg)  10/23/23 201 lb (91.2 kg)   Body mass index is 27.4 kg/m.    Physical Exam Constitutional:      General: He is not in acute distress.    Appearance: Normal appearance. He is not ill-appearing.  HENT:     Head: Normocephalic and atraumatic.  Eyes:  Conjunctiva/sclera: Conjunctivae normal.  Cardiovascular:     Rate and Rhythm: Normal rate and regular rhythm.     Heart sounds: Normal heart sounds.  Pulmonary:     Effort: Pulmonary effort is normal. No respiratory distress.     Breath sounds: Normal breath sounds. No wheezing or rales.  Musculoskeletal:     Right lower leg: No edema.     Left lower leg: No edema.  Skin:    General: Skin is warm and dry.     Findings: No rash.  Neurological:     Mental Status: He is alert. Mental status is at baseline.  Psychiatric:        Mood and Affect: Mood normal.        Lab Results  Component Value Date   WBC 6.9 01/03/2024   HGB 13.5 01/03/2024   HCT 40.1 01/03/2024   PLT 280.0 01/03/2024   GLUCOSE 194 (H) 01/03/2024   CHOL 128 01/03/2024   TRIG 182.0 (H) 01/03/2024   HDL 41.50 01/03/2024   LDLDIRECT 56.0 04/24/2023    LDLCALC 50 01/03/2024   ALT 14 01/03/2024   AST 14 01/03/2024   NA 140 01/03/2024   K 4.3 01/03/2024   CL 104 01/03/2024   CREATININE 1.22 01/03/2024   BUN 23 01/03/2024   CO2 27 01/03/2024   TSH 1.30 01/03/2024   PSA 1.50 05/19/2019   INR 1.1 08/14/2023   HGBA1C 7.6 (H) 01/03/2024   MICROALBUR 5.5 (H) 01/15/2011     Assessment & Plan:    See Problem List for Assessment and Plan of chronic medical problems.

## 2024-08-31 NOTE — Patient Instructions (Addendum)
      Blood work was ordered.       Medications changes include :   None    A referral was ordered and someone will call you to schedule an appointment.     Return in about 6 months (around 03/01/2025) for Physical Exam.

## 2024-09-01 ENCOUNTER — Ambulatory Visit (INDEPENDENT_AMBULATORY_CARE_PROVIDER_SITE_OTHER): Admitting: Internal Medicine

## 2024-09-01 ENCOUNTER — Encounter: Payer: Self-pay | Admitting: Internal Medicine

## 2024-09-01 VITALS — BP 122/60 | HR 64 | Temp 98.0°F | Wt 202.0 lb

## 2024-09-01 DIAGNOSIS — M1A9XX0 Chronic gout, unspecified, without tophus (tophi): Secondary | ICD-10-CM | POA: Diagnosis not present

## 2024-09-01 DIAGNOSIS — I152 Hypertension secondary to endocrine disorders: Secondary | ICD-10-CM | POA: Diagnosis not present

## 2024-09-01 DIAGNOSIS — E1169 Type 2 diabetes mellitus with other specified complication: Secondary | ICD-10-CM

## 2024-09-01 DIAGNOSIS — D126 Benign neoplasm of colon, unspecified: Secondary | ICD-10-CM | POA: Diagnosis not present

## 2024-09-01 DIAGNOSIS — E1142 Type 2 diabetes mellitus with diabetic polyneuropathy: Secondary | ICD-10-CM | POA: Diagnosis not present

## 2024-09-01 DIAGNOSIS — Z8673 Personal history of transient ischemic attack (TIA), and cerebral infarction without residual deficits: Secondary | ICD-10-CM

## 2024-09-01 DIAGNOSIS — H35033 Hypertensive retinopathy, bilateral: Secondary | ICD-10-CM

## 2024-09-01 DIAGNOSIS — E538 Deficiency of other specified B group vitamins: Secondary | ICD-10-CM

## 2024-09-01 DIAGNOSIS — Z8739 Personal history of other diseases of the musculoskeletal system and connective tissue: Secondary | ICD-10-CM

## 2024-09-01 DIAGNOSIS — G479 Sleep disorder, unspecified: Secondary | ICD-10-CM | POA: Diagnosis not present

## 2024-09-01 DIAGNOSIS — E785 Hyperlipidemia, unspecified: Secondary | ICD-10-CM

## 2024-09-01 DIAGNOSIS — E1159 Type 2 diabetes mellitus with other circulatory complications: Secondary | ICD-10-CM

## 2024-09-01 DIAGNOSIS — I2510 Atherosclerotic heart disease of native coronary artery without angina pectoris: Secondary | ICD-10-CM | POA: Diagnosis not present

## 2024-09-01 LAB — CBC WITH DIFFERENTIAL/PLATELET
Basophils Absolute: 0.1 K/uL (ref 0.0–0.1)
Basophils Relative: 1.1 % (ref 0.0–3.0)
Eosinophils Absolute: 0.6 K/uL (ref 0.0–0.7)
Eosinophils Relative: 10.1 % — ABNORMAL HIGH (ref 0.0–5.0)
HCT: 39.6 % (ref 39.0–52.0)
Hemoglobin: 13.5 g/dL (ref 13.0–17.0)
Lymphocytes Relative: 22.3 % (ref 12.0–46.0)
Lymphs Abs: 1.3 K/uL (ref 0.7–4.0)
MCHC: 34.2 g/dL (ref 30.0–36.0)
MCV: 89.5 fl (ref 78.0–100.0)
Monocytes Absolute: 0.4 K/uL (ref 0.1–1.0)
Monocytes Relative: 7.8 % (ref 3.0–12.0)
Neutro Abs: 3.4 K/uL (ref 1.4–7.7)
Neutrophils Relative %: 58.7 % (ref 43.0–77.0)
Platelets: 198 K/uL (ref 150.0–400.0)
RBC: 4.42 Mil/uL (ref 4.22–5.81)
RDW: 13.8 % (ref 11.5–15.5)
WBC: 5.7 K/uL (ref 4.0–10.5)

## 2024-09-01 LAB — LIPID PANEL
Cholesterol: 119 mg/dL (ref 0–200)
HDL: 39.8 mg/dL (ref >39.00)
LDL Cholesterol: 58 mg/dL (ref 0–99)
NonHDL: 79.17
Total CHOL/HDL Ratio: 3
Triglycerides: 108 mg/dL (ref 0.0–149.0)
VLDL: 21.6 mg/dL (ref 0.0–40.0)

## 2024-09-01 LAB — COMPREHENSIVE METABOLIC PANEL WITH GFR
ALT: 15 U/L (ref 0–53)
AST: 17 U/L (ref 0–37)
Albumin: 4.3 g/dL (ref 3.5–5.2)
Alkaline Phosphatase: 61 U/L (ref 39–117)
BUN: 19 mg/dL (ref 6–23)
CO2: 26 meq/L (ref 19–32)
Calcium: 9.6 mg/dL (ref 8.4–10.5)
Chloride: 107 meq/L (ref 96–112)
Creatinine, Ser: 1.19 mg/dL (ref 0.40–1.50)
GFR: 60.06 mL/min (ref >60.00)
Glucose, Bld: 124 mg/dL — ABNORMAL HIGH (ref 70–99)
Potassium: 4.7 meq/L (ref 3.5–5.1)
Sodium: 142 meq/L (ref 135–145)
Total Bilirubin: 0.5 mg/dL (ref 0.2–1.2)
Total Protein: 6.9 g/dL (ref 6.0–8.3)

## 2024-09-01 LAB — VITAMIN B12: Vitamin B-12: 475 pg/mL (ref 211–911)

## 2024-09-01 LAB — MICROALBUMIN / CREATININE URINE RATIO
Creatinine,U: 82.4 mg/dL
Microalb Creat Ratio: 22.2 mg/g (ref 0.0–30.0)
Microalb, Ur: 1.8 mg/dL (ref 0.0–1.9)

## 2024-09-01 LAB — HEMOGLOBIN A1C: Hgb A1c MFr Bld: 6.9 % — ABNORMAL HIGH (ref 4.6–6.5)

## 2024-09-01 MED ORDER — ZOLPIDEM TARTRATE 10 MG PO TABS: ORAL_TABLET | ORAL | 5 refills | Status: AC

## 2024-09-01 NOTE — Assessment & Plan Note (Signed)
 Chronic Ambien helps him fall asleep Continue zolpidem 5 mg nightly as needed

## 2024-09-01 NOTE — Assessment & Plan Note (Signed)
 Chronic Following with cardiology  No concerning symptoms to suggest angina Continue ASA 81 mg, metoprolol 25 mg xl daily, ramipril 10 mg twice daily, crestor 20 mg daily Stressed good sugar control

## 2024-09-01 NOTE — Assessment & Plan Note (Signed)
Chronic Check level 

## 2024-09-01 NOTE — Assessment & Plan Note (Signed)
 Chronic No residual deficits Blood pressure well controlled Stressed good control of sugars Continue ASA 81 mg daily, Crestor  20 mg daily

## 2024-09-01 NOTE — Assessment & Plan Note (Addendum)
 Chronic Denies any gout episodes recently - once a year has a touch of it Continue allopurinol 300 mg daily

## 2024-09-01 NOTE — Assessment & Plan Note (Signed)
 Chronic Eye exams up-to-date Stressed better sugar control

## 2024-09-01 NOTE — Assessment & Plan Note (Signed)
Chronic Check lipid panel, cmp  Continue crestor 20 mg daily Regular exercise and healthy diet encouraged

## 2024-09-01 NOTE — Assessment & Plan Note (Signed)
 History of tubular adenoma Due for colonoscopy Encouraged him to consider having - This would likely be his last

## 2024-09-01 NOTE — Assessment & Plan Note (Signed)
 Chronic Sugars not ideally controlled when checked last-recheck A1c Stressed good control of sugars Gabapentin  was not effective Has numbness and this is tolerable No treatment at this time

## 2024-09-01 NOTE — Assessment & Plan Note (Signed)
 Chronic Blood pressure well controlled CMP, cbc Continue metoprolol 25 mg xl daily, ramipril 10 mg twice daily, amlodipine 10 mg daily

## 2024-09-03 ENCOUNTER — Ambulatory Visit: Payer: Self-pay | Admitting: Internal Medicine

## 2024-09-11 ENCOUNTER — Ambulatory Visit

## 2024-10-01 ENCOUNTER — Other Ambulatory Visit: Payer: Self-pay | Admitting: Internal Medicine

## 2024-10-08 ENCOUNTER — Ambulatory Visit (INDEPENDENT_AMBULATORY_CARE_PROVIDER_SITE_OTHER)

## 2024-10-08 VITALS — Ht 72.0 in | Wt 202.0 lb

## 2024-10-08 DIAGNOSIS — Z0000 Encounter for general adult medical examination without abnormal findings: Secondary | ICD-10-CM | POA: Diagnosis not present

## 2024-10-08 NOTE — Progress Notes (Signed)
 Subjective:   Alex Bryant is a 73 y.o. who presents for a Medicare Wellness preventive visit.  As a reminder, Annual Wellness Visits don't include a physical exam, and some assessments may be limited, especially if this visit is performed virtually. We may recommend an in-person follow-up visit with your provider if needed.  Visit Complete: Virtual I connected with  Alex Bryant on 10/08/24 by a audio enabled telemedicine application and verified that I am speaking with the correct person using two identifiers.  Patient Location: Home  Provider Location: Office/[place]  I discussed the limitations of evaluation and management by telemedicine. The patient expressed understanding and agreed to proceed.  Vital Signs: Because this visit was a virtual/telehealth visit, some criteria may be missing or patient reported. Any vitals not documented were not able to be obtained and vitals that have been documented are patient reported.  VideoDeclined- This patient declined Librarian, academic. Therefore the visit was completed with audio only.  Persons Participating in Visit: Patient.  AWV Questionnaire: No: Patient Medicare AWV questionnaire was not completed prior to this visit.  Cardiac Risk Factors include: advanced age (>77men, >60 women);diabetes mellitus;dyslipidemia;hypertension;male gender;Other (see comment), Risk factor comments: CAD; CVA; A. Fib     Objective:    Today's Vitals   10/08/24 0807  Weight: 202 lb (91.6 kg)  Height: 6' (1.829 m)   Body mass index is 27.4 kg/m.     10/08/2024    8:07 AM 08/14/2023   10:31 AM 06/12/2022    9:09 AM 03/01/2021    8:38 AM 08/14/2018    4:14 PM 08/14/2018    8:08 AM 11/20/2016    1:00 AM  Advanced Directives  Does Patient Have a Medical Advance Directive? Yes No Yes Yes Yes  Yes  No   Type of Estate agent of [place];Living will  Living will;Healthcare Power of Attorney Living  will;Healthcare Power of [employer] Power of [place];Living will Healthcare Power of [place];Living will   Does patient want to make changes to medical advance directive?   No - Patient declined No - Patient declined No - Patient declined  No - Patient declined    Copy of Healthcare Power of Attorney in Chart? No - copy requested  No - copy requested No - copy requested No - copy requested  No - copy requested    Would patient like information on creating a medical advance directive?       No - Patient declined      Data saved with a previous flowsheet row definition    Current Medications (verified) Outpatient Encounter Medications as of 10/08/2024  Medication Sig   allopurinol (ZYLOPRIM) 300 MG tablet Take 300 mg by mouth daily.   amLODipine  (NORVASC ) 10 MG tablet Take 1 tablet (10 mg total) by mouth daily.   aspirin  EC 81 MG tablet Take 1 tablet (81 mg total) by mouth daily. Swallow whole.   celecoxib  (CELEBREX ) 200 MG capsule TAKE 1 CAPSULE(200 MG) BY MOUTH DAILY   FARXIGA  10 MG TABS tablet TAKE 1 TABLET(10 MG) BY MOUTH DAILY BEFORE BREAKFAST   fluticasone (FLONASE) 50 MCG/ACT nasal spray    glimepiride  (AMARYL ) 2 MG tablet TAKE 1 TABLET(2 MG) BY MOUTH DAILY BEFORE BREAKFAST   glucose blood test strip 1 each by Other route as needed. Use as instructed    hydrocortisone  (ANUSOL -HC) 2.5 % rectal cream Place 1 Application rectally 2 (two) times daily.   Lancets (FREESTYLE) lancets 1 each  by Other route 2 (two) times daily. Use to help check blood sugar twice a day. Dx E11.9   metFORMIN  (GLUCOPHAGE ) 1000 MG tablet TAKE 1 TABLET(1000 MG) BY MOUTH TWICE DAILY WITH A MEAL   metoprolol  succinate (TOPROL -XL) 25 MG 24 hr tablet TAKE 1 TABLET(25 MG) BY MOUTH DAILY   ramipril  (ALTACE ) 10 MG capsule Take 1 capsule (10 mg total) by mouth daily.   rosuvastatin  (CRESTOR ) 20 MG tablet TAKE 1 TABLET(20 MG) BY MOUTH DAILY   zolpidem  (AMBIEN ) 10 MG tablet TAKE 1 TABLET(10 MG) BY MOUTH AT BEDTIME  AS NEEDED FOR SLEEP.   No facility-administered encounter medications on file as of 10/08/2024.    Allergies (verified) Oxycodone    History: Past Medical History:  Diagnosis Date   Allergy    Coronary artery disease    Diet-controlled diabetes mellitus (HCC)    AODM   History of gout    Hyperlipidemia    Hypertension    Progressive angina (HCC) 08/12/2018   Chest pain   Stroke (HCC) 11/19/2016   fully recovered (08/14/2018)   Tick bite 2001   Erlichiosis;Alex Hammersmith MD, ID   Past Surgical History:  Procedure Laterality Date   BACK SURGERY     COLONOSCOPY     COLONOSCOPY W/ POLYPECTOMY  2010; 2015   Dr Obie   CORONARY ARTERY BYPASS GRAFT N/A 08/19/2018   Procedure: CORONARY ARTERY BYPASS GRAFTING (CABG) x 5  USING LEFT INTERNAL MAMMARY ARTERY AND ENDOSCOPICALLY HARVESTED RIGHT SAPHENOUS VEIN.;  Surgeon: Fleeta Hanford Coy, MD;  Location: [facility] OR;  Service: Open Heart Surgery;  Laterality: N/A;   EP IMPLANTABLE DEVICE N/A 11/21/2016   Procedure: Loop Recorder Insertion;  Surgeon: Lynwood Rakers, MD;  Location: [facility] INVASIVE CV LAB;  Service: Cardiovascular;  Laterality: N/A;   ESI  2014   @L5 -S1 X3    EXCISIONAL HEMORRHOIDECTOMY  ~ 1995   FACIAL RECONSTRUCTION SURGERY Right 1990s   plating & pinning of maxilla   INGUINAL HERNIA REPAIR Bilateral 1959   LEFT HEART CATH AND CORONARY ANGIOGRAPHY N/A 08/14/2018   Procedure: LEFT HEART CATH AND CORONARY ANGIOGRAPHY;  Surgeon: Court Dorn PARAS, MD;  Location: [facility] INVASIVE CV LAB;  Service: Cardiovascular;  Laterality: N/A;   POLYPECTOMY     POSTERIOR LUMBAR FUSION  10/17/2013   L5-S1 ; Alex Bryant , [place]   TEE WITHOUT CARDIOVERSION N/A 11/21/2016   Procedure: TRANSESOPHAGEAL ECHOCARDIOGRAM (TEE);  Surgeon: Ezra GORMAN Shuck, MD;  Location: [facility] ENDOSCOPY;  Service: Cardiovascular;  Laterality: N/A;   TEE WITHOUT CARDIOVERSION N/A 08/19/2018   Procedure: TRANSESOPHAGEAL ECHOCARDIOGRAM (TEE);  Surgeon: Fleeta Hanford, Coy, MD;  Location: [facility] OR;   Service: Open Heart Surgery;  Laterality: N/A;   Family History  Problem Relation Age of Onset   Heart attack Father 24   Hypertension Mother    Stroke Mother        X2   Diabetes Sister        pre Diabetes   Stroke Maternal Uncle    Cancer Neg Hx    Colon cancer Neg Hx    Rectal cancer Neg Hx    Stomach cancer Neg Hx    Colon polyps Neg Hx    Esophageal cancer Neg Hx    Headache Neg Hx    Migraines Neg Hx    Social History   Socioeconomic History   Marital status: Married    Spouse name: Not on file   Number of children: 3   Years of education: Not on file  Highest education level: Master's degree (e.g., MA, MS, MEng, MEd, MSW, MBA)  Occupational History   Not on file  Tobacco Use   Smoking status: Former    Current packs/day: 0.00    Average packs/day: 0.3 packs/day for 6.0 years (1.5 ttl pk-yrs)    Types: Cigarettes    Start date: 12/17/1974    Quit date: 12/17/1980    Years since quitting: 43.8   Smokeless tobacco: Never   Tobacco comments:    smoked off and on 1966- 1982, up to < 5 cigarettes / day  Vaping Use   Vaping status: Never Used  Substance and Sexual Activity   Alcohol use: Yes    Alcohol/week: 4.0 standard drinks of alcohol    Types: 4 Glasses of wine per week    Comment:  socially   Drug use: Never   Sexual activity: Yes  Other Topics Concern   Not on file  Social History Narrative   Lives at home with wife    Right handed   Caffeine : decaf coffee   Social Drivers of Corporate investment banker Strain: Low Risk  (10/08/2024)   Overall Financial Resource Strain (CARDIA)    Difficulty of Paying Living Expenses: Not hard at all  Food Insecurity: No Food Insecurity (10/08/2024)   Hunger Vital Sign    Worried About Running Out of Food in the Last Year: Never true    Ran Out of Food in the Last Year: Never true  Transportation Needs: No Transportation Needs (10/08/2024)   PRAPARE - Administrator, Civil Service (Medical): No     Lack of Transportation (Non-Medical): No  Physical Activity: Sufficiently Active (10/08/2024)   Exercise Vital Sign    Days of Exercise per Week: 7 days    Minutes of Exercise per Session: 60 min  Stress: No Stress Concern Present (10/08/2024)   [employer] of Occupational Health - Occupational Stress Questionnaire    Feeling of Stress: Not at all  Social Connections: Socially Integrated (10/08/2024)   Social Connection and Isolation Panel    Frequency of Communication with Friends and Family: More than three times a week    Frequency of Social Gatherings with Friends and Family: More than three times a week    Attends Religious Services: More than 4 times per year    Active Member of [employer] or Organizations: Yes    Attends Engineer, structural: More than 4 times per year    Marital Status: Married    Tobacco Counseling Counseling given: Not Answered Tobacco comments: smoked off and on 1966- 1982, up to < 5 cigarettes / day    Clinical Intake:  Pre-visit preparation completed: Yes  Pain : No/denies pain     BMI - recorded: 27.4 Nutritional Status: BMI 25 -29 Overweight Nutritional Risks: None Diabetes: Yes CBG done?: No Did pt. bring in CBG monitor from home?: No  Lab Results  Component Value Date   HGBA1C 6.9 (H) 09/01/2024   HGBA1C 7.6 (H) 01/03/2024   HGBA1C 8.0 (H) 04/24/2023     How often do you need to have someone help you when you read instructions, pamphlets, or other written materials from your doctor or pharmacy?: 1 - Never  Interpreter Needed?: No  Information entered by :: Verdie Saba, CMA   Activities of Daily Living     10/08/2024    8:10 AM  In your present state of health, do you have any difficulty performing the following activities:  Hearing? 0  Vision? 0  Difficulty concentrating or making decisions? 0  Walking or climbing stairs? 0  Dressing or bathing? 0  Doing errands, shopping? 0  Preparing Food and eating ? N   Using the Toilet? N  In the past six months, have you accidently leaked urine? N  Do you have problems with loss of bowel control? N  Managing your Medications? N  Managing your Finances? N  Housekeeping or managing your Housekeeping? N    Patient Care Team: Geofm Glade PARAS, MD as PCP - General (Internal Medicine) Court Dorn PARAS, MD as PCP - Cardiology (Cardiology) Walterine Mayans, MD as Referring Physician (Orthopedic Surgery) Camillo Golas, MD as Consulting Physician (Ophthalmology)  I have updated your Care Teams any recent Medical Services you may have received from other providers in the past year.     Assessment:   This is a routine wellness examination for [place].  Hearing/Vision screen Hearing Screening - Comments:: Denies hearing difficulties   Vision Screening - Comments:: Wears rx glasses - up to date with routine eye exams with [facility]   Goals Addressed               This Visit's Progress     Weight (lb) < 200 lb (90.7 kg) (pt-stated)   202 lb (91.6 kg)     Patient stated he plans to lose about 10lbs       Depression Screen     10/08/2024    8:11 AM 09/01/2024    8:28 AM 01/03/2024   10:19 AM 01/03/2024   10:18 AM 04/24/2023    3:04 PM 06/12/2022    9:10 AM 03/26/2022    3:01 PM  PHQ 2/9 Scores  PHQ - 2 Score 0 0 0 0 0 0 0  PHQ- 9 Score 0 1 0  0      Fall Risk     10/08/2024    8:10 AM 09/01/2024    8:27 AM 01/03/2024   10:18 AM 04/24/2023    3:03 PM 06/12/2022    9:10 AM  Fall Risk   Falls in the past year? 0 0 0 0 0  Number falls in past yr: 0 0 0 0 0  Injury with Fall? 0 0 0 0 0  Risk for fall due to : No Fall Risks No Fall Risks No Fall Risks No Fall Risks No Fall Risks  Follow up Falls evaluation completed;Falls prevention discussed Falls evaluation completed Falls evaluation completed Falls evaluation completed Falls evaluation completed      Data saved with a previous flowsheet row definition    MEDICARE RISK AT HOME:  Medicare Risk at  Home Any stairs in or around the home?: Yes If so, are there any without handrails?: No Home free of loose throw rugs in walkways, pet beds, electrical cords, etc?: Yes Adequate lighting in your home to reduce risk of falls?: Yes Life alert?: No Use of a cane, walker or w/c?: No Grab bars in the bathroom?: No Shower chair or bench in shower?: Yes Elevated toilet seat or a handicapped toilet?: Yes  TIMED UP AND GO:  Was the test performed?  No  Cognitive Function: 6CIT completed        10/08/2024    8:21 AM 06/12/2022    9:11 AM  6CIT Screen  What Year? 0 points 0 points  What month? 0 points 0 points  What time? 0 points 0 points  Count back from 20 0 points 0  points  Months in reverse 0 points 0 points  Repeat phrase 0 points 0 points  Total Score 0 points 0 points    Immunizations Immunization History  Administered Date(s) Administered   Fluad Quad(high Dose 65+) 09/12/2019, 10/11/2020   INFLUENZA, HIGH DOSE SEASONAL PF 12/03/2016, 11/21/2017   Influenza Split 10/13/2014   Influenza Whole 09/16/2000   Influenza-Unspecified 10/06/2021   PFIZER(Purple Top)SARS-COV-2 Vaccination 01/06/2020, 01/27/2020, 09/19/2020   Pneumococcal Conjugate-13 04/16/2017   Pneumococcal Polysaccharide-23 05/22/2018   Zoster Recombinant(Shingrix) 09/12/2019, 11/14/2019   Zoster, Live 10/13/2014    Screening Tests Health Maintenance  Topic Date Due   Colonoscopy  01/18/2023   COVID-19 Vaccine (4 - 2025-26 season) 08/17/2024   DTaP/Tdap/Td (1 - Tdap) 01/02/2025 (Originally 11/20/1970)   Influenza Vaccine  03/16/2025 (Originally 07/17/2024)   FOOT EXAM  01/02/2025   OPHTHALMOLOGY EXAM  01/14/2025   HEMOGLOBIN A1C  03/01/2025   Diabetic kidney evaluation - eGFR measurement  09/01/2025   Diabetic kidney evaluation - Urine ACR  09/01/2025   Medicare Annual Wellness (AWV)  10/08/2025   Pneumococcal Vaccine: 50+ Years  Completed   Hepatitis C Screening  Completed   Zoster Vaccines-  Shingrix  Completed   Meningococcal B Vaccine  Aged Out    Health Maintenance Items Addressed:  I have recommended that this patient have a Influenza and COVID vaccines but he declines at this time. I have discussed the risks and benefits of this procedure with him. The patient verbalizes understanding.   Additional Screening:  Vision Screening: Recommended annual ophthalmology exams for early detection of glaucoma and other disorders of the eye. Is the patient up to date with their annual eye exam?  Yes  Who is the provider or what is the name of the office in which the patient attends annual eye exams? [facility]  Dental Screening: Recommended annual dental exams for proper oral hygiene  Community Resource Referral / Chronic Care Management: CRR required this visit?  No   CCM required this visit?  No   Plan:    I have personally reviewed and noted the following in the patient's chart:   Medical and social history Use of alcohol, tobacco or illicit drugs  Current medications and supplements including opioid prescriptions. Patient is not currently taking opioid prescriptions. Functional ability and status Nutritional status Physical activity Advanced directives List of other physicians Hospitalizations, surgeries, and ER visits in previous 12 months Vitals Screenings to include cognitive, depression, and falls Referrals and appointments  In addition, I have reviewed and discussed with patient certain preventive protocols, quality metrics, and best practice recommendations. A written personalized care plan for preventive services as well as general preventive health recommendations were provided to patient.   Verdie CHRISTELLA Saba, CMA   10/08/2024   After Visit Summary: (MyChart) Due to this being a telephonic visit, the after visit summary with patients personalized plan was offered to patient via MyChart   Notes: Nothing significant to report at this time.

## 2024-10-08 NOTE — Patient Instructions (Addendum)
 Mr. Biermann,  Thank you for taking the time for your Medicare Wellness Visit. I appreciate your continued commitment to your health goals. Please review the care plan we discussed, and feel free to reach out if I can assist you further.  Medicare recommends these wellness visits once per year to help you and your care team stay ahead of potential health issues. These visits are designed to focus on prevention, allowing your provider to concentrate on managing your acute and chronic conditions during your regular appointments.  Please note that Annual Wellness Visits do not include a physical exam. Some assessments may be limited, especially if the visit was conducted virtually. If needed, we may recommend a separate in-person follow-up with your provider.  Ongoing Care Seeing your primary care provider every 3 to 6 months helps us  monitor your health and provide consistent, personalized care.   Referrals If a referral was made during today's visit and you haven't received any updates within two weeks, please contact the referred provider directly to check on the status.  Recommended Screenings:  Health Maintenance  Topic Date Due   Colon Cancer Screening  01/18/2023   COVID-19 Vaccine (4 - 2025-26 season) 08/17/2024   DTaP/Tdap/Td vaccine (1 - Tdap) 01/02/2025*   Flu Shot  03/16/2025*   Complete foot exam   01/02/2025   Eye exam for diabetics  01/14/2025   Hemoglobin A1C  03/01/2025   Yearly kidney function blood test for diabetes  09/01/2025   Yearly kidney health urinalysis for diabetes  09/01/2025   Medicare Annual Wellness Visit  10/08/2025   Pneumococcal Vaccine for age over 6  Completed   Hepatitis C Screening  Completed   Zoster (Shingles) Vaccine  Completed   Meningitis B Vaccine  Aged Out  *Topic was postponed. The date shown is not the original due date.       10/08/2024    8:07 AM  Advanced Directives  Does Patient Have a Medical Advance Directive? Yes  Type of  Estate agent of [place];Living will  Copy of Healthcare Power of Attorney in Chart? No - copy requested   Advance Care Planning is important because it: Ensures you receive medical care that aligns with your values, goals, and preferences. Provides guidance to your family and loved ones, reducing the emotional burden of decision-making during critical moments.  Vision: Annual vision screenings are recommended for early detection of glaucoma, cataracts, and diabetic retinopathy. These exams can also reveal signs of chronic conditions such as diabetes and high blood pressure.  Dental: Annual dental screenings help detect early signs of oral cancer, gum disease, and other conditions linked to overall health, including heart disease and diabetes.

## 2024-10-12 ENCOUNTER — Other Ambulatory Visit: Payer: Self-pay | Admitting: Internal Medicine

## 2024-10-13 ENCOUNTER — Other Ambulatory Visit: Payer: Self-pay | Admitting: Cardiology

## 2024-10-13 DIAGNOSIS — H25813 Combined forms of age-related cataract, bilateral: Secondary | ICD-10-CM | POA: Diagnosis not present

## 2024-10-13 DIAGNOSIS — E119 Type 2 diabetes mellitus without complications: Secondary | ICD-10-CM | POA: Diagnosis not present

## 2024-10-13 DIAGNOSIS — H5203 Hypermetropia, bilateral: Secondary | ICD-10-CM | POA: Diagnosis not present

## 2024-10-27 ENCOUNTER — Other Ambulatory Visit: Payer: Self-pay | Admitting: Internal Medicine

## 2024-10-29 ENCOUNTER — Other Ambulatory Visit: Payer: Self-pay | Admitting: Cardiology

## 2025-01-05 ENCOUNTER — Other Ambulatory Visit: Payer: Self-pay | Admitting: Internal Medicine

## 2025-01-05 ENCOUNTER — Other Ambulatory Visit: Payer: Self-pay | Admitting: Cardiology

## 2025-01-12 NOTE — Telephone Encounter (Signed)
 Lipids completed on 09/01/24

## 2025-01-14 ENCOUNTER — Other Ambulatory Visit: Payer: Self-pay | Admitting: Cardiology

## 2025-01-14 DIAGNOSIS — I10 Essential (primary) hypertension: Secondary | ICD-10-CM

## 2025-01-14 DIAGNOSIS — I2510 Atherosclerotic heart disease of native coronary artery without angina pectoris: Secondary | ICD-10-CM

## 2025-01-15 ENCOUNTER — Other Ambulatory Visit: Payer: Self-pay | Admitting: Cardiovascular Disease

## 2025-01-15 DIAGNOSIS — I2510 Atherosclerotic heart disease of native coronary artery without angina pectoris: Secondary | ICD-10-CM

## 2025-01-15 DIAGNOSIS — I10 Essential (primary) hypertension: Secondary | ICD-10-CM
